# Patient Record
Sex: Female | Born: 1955
Health system: Southern US, Community
[De-identification: ages and names within clinical notes are randomized; demographics above are authoritative.]

## PROBLEM LIST (undated history)

## (undated) ENCOUNTER — Inpatient Hospital Stay (HOSPITAL_COMMUNITY): Payer: 59

## (undated) DIAGNOSIS — M1990 Unspecified osteoarthritis, unspecified site: Secondary | ICD-10-CM

## (undated) DIAGNOSIS — R0600 Dyspnea, unspecified: Secondary | ICD-10-CM

## (undated) DIAGNOSIS — I499 Cardiac arrhythmia, unspecified: Secondary | ICD-10-CM

## (undated) DIAGNOSIS — M722 Plantar fascial fibromatosis: Secondary | ICD-10-CM

## (undated) DIAGNOSIS — I10 Essential (primary) hypertension: Secondary | ICD-10-CM

## (undated) DIAGNOSIS — E7800 Pure hypercholesterolemia, unspecified: Secondary | ICD-10-CM

## (undated) DIAGNOSIS — I5189 Other ill-defined heart diseases: Secondary | ICD-10-CM

## (undated) DIAGNOSIS — J479 Bronchiectasis, uncomplicated: Secondary | ICD-10-CM

## (undated) DIAGNOSIS — C9110 Chronic lymphocytic leukemia of B-cell type not having achieved remission: Secondary | ICD-10-CM

## (undated) DIAGNOSIS — R112 Nausea with vomiting, unspecified: Secondary | ICD-10-CM

## (undated) DIAGNOSIS — N840 Polyp of corpus uteri: Secondary | ICD-10-CM

## (undated) DIAGNOSIS — I519 Heart disease, unspecified: Secondary | ICD-10-CM

## (undated) DIAGNOSIS — K219 Gastro-esophageal reflux disease without esophagitis: Secondary | ICD-10-CM

## (undated) DIAGNOSIS — Z87898 Personal history of other specified conditions: Secondary | ICD-10-CM

## (undated) DIAGNOSIS — C50919 Malignant neoplasm of unspecified site of unspecified female breast: Secondary | ICD-10-CM

## (undated) DIAGNOSIS — Z9889 Other specified postprocedural states: Secondary | ICD-10-CM

## (undated) DIAGNOSIS — I340 Nonrheumatic mitral (valve) insufficiency: Secondary | ICD-10-CM

## (undated) DIAGNOSIS — J189 Pneumonia, unspecified organism: Secondary | ICD-10-CM

## (undated) DIAGNOSIS — C801 Malignant (primary) neoplasm, unspecified: Secondary | ICD-10-CM

## (undated) HISTORY — PX: WRIST SURGERY: SHX841

## (undated) HISTORY — DX: Essential (primary) hypertension: I10

## (undated) HISTORY — PX: TUBAL LIGATION: SHX77

## (undated) HISTORY — PX: CHOLECYSTECTOMY: SHX55

## (undated) HISTORY — PX: HYSTEROSCOPY: SHX211

## (undated) HISTORY — PX: PARTIAL KNEE ARTHROPLASTY: SHX2174

## (undated) HISTORY — DX: Polyp of corpus uteri: N84.0

## (undated) HISTORY — PX: DILATION AND CURETTAGE OF UTERUS: SHX78

## (undated) HISTORY — PX: REFRACTIVE SURGERY: SHX103

## (undated) HISTORY — PX: ABDOMINAL SURGERY: SHX537

## (undated) HISTORY — PX: FOOT SURGERY: SHX648

## (undated) HISTORY — DX: Pure hypercholesterolemia, unspecified: E78.00

## (undated) HISTORY — PX: KNEE SURGERY: SHX244

## (undated) HISTORY — PX: OTHER SURGICAL HISTORY: SHX169

## (undated) MED FILL — Dexamethasone Sodium Phosphate Inj 100 MG/10ML: INTRAMUSCULAR | Qty: 2 | Status: AC

---
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
## 1998-08-20 ENCOUNTER — Other Ambulatory Visit: Admission: RE | Admit: 1998-08-20 | Discharge: 1998-08-20 | Payer: Self-pay | Admitting: Obstetrics and Gynecology

## 1999-11-25 ENCOUNTER — Other Ambulatory Visit: Admission: RE | Admit: 1999-11-25 | Discharge: 1999-11-25 | Payer: Self-pay | Admitting: Obstetrics and Gynecology

## 2000-12-07 ENCOUNTER — Other Ambulatory Visit: Admission: RE | Admit: 2000-12-07 | Discharge: 2000-12-07 | Payer: Self-pay | Admitting: Obstetrics and Gynecology

## 2001-02-01 ENCOUNTER — Other Ambulatory Visit: Admission: RE | Admit: 2001-02-01 | Discharge: 2001-02-01 | Payer: Self-pay | Admitting: Obstetrics and Gynecology

## 2001-02-01 ENCOUNTER — Encounter (INDEPENDENT_AMBULATORY_CARE_PROVIDER_SITE_OTHER): Payer: Self-pay

## 2001-07-26 ENCOUNTER — Ambulatory Visit (HOSPITAL_COMMUNITY): Admission: RE | Admit: 2001-07-26 | Discharge: 2001-07-26 | Payer: Self-pay | Admitting: Gastroenterology

## 2001-07-26 ENCOUNTER — Encounter: Payer: Self-pay | Admitting: Gastroenterology

## 2001-08-27 ENCOUNTER — Encounter: Payer: Self-pay | Admitting: General Surgery

## 2001-08-30 ENCOUNTER — Encounter (INDEPENDENT_AMBULATORY_CARE_PROVIDER_SITE_OTHER): Payer: Self-pay | Admitting: Specialist

## 2001-08-30 ENCOUNTER — Observation Stay (HOSPITAL_COMMUNITY): Admission: RE | Admit: 2001-08-30 | Discharge: 2001-08-31 | Payer: Self-pay | Admitting: General Surgery

## 2002-05-16 ENCOUNTER — Other Ambulatory Visit: Admission: RE | Admit: 2002-05-16 | Discharge: 2002-05-16 | Payer: Self-pay | Admitting: Obstetrics and Gynecology

## 2003-10-16 ENCOUNTER — Other Ambulatory Visit: Admission: RE | Admit: 2003-10-16 | Discharge: 2003-10-16 | Payer: Self-pay | Admitting: Obstetrics and Gynecology

## 2004-05-20 ENCOUNTER — Encounter: Admission: RE | Admit: 2004-05-20 | Discharge: 2004-05-20 | Payer: Self-pay | Admitting: Orthopedic Surgery

## 2004-07-19 ENCOUNTER — Observation Stay (HOSPITAL_COMMUNITY): Admission: RE | Admit: 2004-07-19 | Discharge: 2004-07-20 | Payer: Self-pay | Admitting: Orthopedic Surgery

## 2004-07-19 ENCOUNTER — Encounter (INDEPENDENT_AMBULATORY_CARE_PROVIDER_SITE_OTHER): Payer: Self-pay | Admitting: *Deleted

## 2004-10-31 ENCOUNTER — Emergency Department (HOSPITAL_COMMUNITY): Admission: EM | Admit: 2004-10-31 | Discharge: 2004-10-31 | Payer: Self-pay | Admitting: Emergency Medicine

## 2004-11-08 ENCOUNTER — Encounter: Admission: RE | Admit: 2004-11-08 | Discharge: 2004-11-08 | Payer: Self-pay | Admitting: Internal Medicine

## 2004-11-18 ENCOUNTER — Other Ambulatory Visit: Admission: RE | Admit: 2004-11-18 | Discharge: 2004-11-18 | Payer: Self-pay | Admitting: Obstetrics and Gynecology

## 2005-12-08 ENCOUNTER — Other Ambulatory Visit: Admission: RE | Admit: 2005-12-08 | Discharge: 2005-12-08 | Payer: Self-pay | Admitting: Obstetrics and Gynecology

## 2006-03-01 ENCOUNTER — Encounter: Admission: RE | Admit: 2006-03-01 | Discharge: 2006-03-22 | Payer: Self-pay | Admitting: Neurology

## 2006-06-29 ENCOUNTER — Encounter: Admission: RE | Admit: 2006-06-29 | Discharge: 2006-06-29 | Payer: Self-pay | Admitting: Neurology

## 2006-07-06 ENCOUNTER — Ambulatory Visit (HOSPITAL_COMMUNITY): Admission: RE | Admit: 2006-07-06 | Discharge: 2006-07-06 | Payer: Self-pay | Admitting: Neurology

## 2006-07-06 ENCOUNTER — Encounter: Payer: Self-pay | Admitting: Vascular Surgery

## 2006-07-14 ENCOUNTER — Encounter: Admission: RE | Admit: 2006-07-14 | Discharge: 2006-07-14 | Payer: Self-pay | Admitting: Neurology

## 2006-12-26 ENCOUNTER — Other Ambulatory Visit: Admission: RE | Admit: 2006-12-26 | Discharge: 2006-12-26 | Payer: Self-pay | Admitting: Obstetrics and Gynecology

## 2008-03-17 ENCOUNTER — Other Ambulatory Visit: Admission: RE | Admit: 2008-03-17 | Discharge: 2008-03-17 | Payer: Self-pay | Admitting: Obstetrics and Gynecology

## 2009-06-30 ENCOUNTER — Other Ambulatory Visit: Admission: RE | Admit: 2009-06-30 | Discharge: 2009-06-30 | Payer: Self-pay | Admitting: Obstetrics and Gynecology

## 2009-06-30 ENCOUNTER — Encounter: Payer: Self-pay | Admitting: Obstetrics and Gynecology

## 2009-06-30 ENCOUNTER — Ambulatory Visit: Payer: Self-pay | Admitting: Obstetrics and Gynecology

## 2009-07-15 ENCOUNTER — Encounter: Payer: Self-pay | Admitting: Obstetrics and Gynecology

## 2009-07-15 ENCOUNTER — Ambulatory Visit: Payer: Self-pay | Admitting: Obstetrics and Gynecology

## 2009-07-20 ENCOUNTER — Ambulatory Visit: Payer: Self-pay | Admitting: Obstetrics and Gynecology

## 2009-07-26 ENCOUNTER — Encounter: Payer: Self-pay | Admitting: Obstetrics and Gynecology

## 2009-07-26 ENCOUNTER — Ambulatory Visit (HOSPITAL_BASED_OUTPATIENT_CLINIC_OR_DEPARTMENT_OTHER): Admission: RE | Admit: 2009-07-26 | Discharge: 2009-07-26 | Payer: Self-pay | Admitting: Obstetrics and Gynecology

## 2009-07-26 ENCOUNTER — Ambulatory Visit: Payer: Self-pay | Admitting: Obstetrics and Gynecology

## 2009-08-02 ENCOUNTER — Ambulatory Visit: Payer: Self-pay | Admitting: Obstetrics and Gynecology

## 2010-07-18 ENCOUNTER — Other Ambulatory Visit: Admission: RE | Admit: 2010-07-18 | Discharge: 2010-07-18 | Payer: Self-pay | Admitting: Obstetrics and Gynecology

## 2010-07-18 ENCOUNTER — Ambulatory Visit: Payer: Self-pay | Admitting: Obstetrics and Gynecology

## 2011-02-24 NOTE — Op Note (Signed)
Colleen Shannon, Colleen Shannon                ACCOUNT NO.:  [id number]   MEDICAL RECORD NO.:  [id number]          PATIENT TYPE:  AMB   LOCATION:  DAY                          FACILITY:  [facility]   PHYSICIAN:  Marlowe Kays, M.D.  DATE OF BIRTH:  [birth year]   DATE OF PROCEDURE:  07/19/2004  DATE OF DISCHARGE:                                 OPERATIVE REPORT   PREOPERATIVE DIAGNOSES:  1.  Painful bunion.  2.  Clawing second toe associated with long second toe.  3.  Morton's neuroma, second and third web space, left foot.   POSTOPERATIVE DIAGNOSES:  1.  Painful bunion.  2.  Second claw toe.  3.  Atypical Morton's neuroma, second and third web space, left foot.   OPERATION:  1.  Simple bunionectomy.  2.  Fusion proximal interphalangeal joint, second toe.  3.  Excision of fibrocartilaginous tissue compressing the common digital      nerve, second and third web space, left foot.   SURGEON:  Marlowe Kays, M.D.   ASSISTANT:  Nurse.   ANESTHESIA:  General.   PATHOLOGY:  __________   JUSTIFICATION FOR PROCEDURE:  I have been following this young lady for a  long time regarding the problems listed above with her left foot.  She had  no metatarsus primus varus and I have injected the second and third web  space on her left foot.  Because of persistent problems in all three areas,  with her second toe being longer than the great toe, she is here today for  the above-mentioned surgery.  See operative description below for additional  details.   PROCEDURE:  Pneumatic tourniquet.  Leg was Esmarched nonsterilely.  Left  foot and ankle appropriate Duraprep, draped in a sterile field.  I first  made a dorsal web splitting incision in the second and third web space and  with soft dissection, I expected to find Morton's neuroma.  Found instead a  1.5-cm thick fibrocartilaginous tissue that was partially attached to the  outer side of the metatarsal head and looked like it was a remnant of  intermetatarsal ligament.  This was pressing on her common digital nerve  which appeared to have a normal appearance.  It was soft, pliable, and with  no scar about it.  Accordingly, I made the decision that she had an atypical  Morton's neuroma with Morton's neuroma-type symptoms but the problem was  coming from pressure of the fibrocartilaginous tissue pressing on the nerve,  and I elected to simply excise it.  The wound was then irrigated with  sterile saline.  Soft tissue was infiltrated with 0.5% plain Marcaine.  The  subcutaneous tissue was closed with interrupted 3-0 Vicryl, skin with  interrupted 4-0 nylon mattress sutures.  I then made a dorsal medial  incision over the prominent bunion.  Dorsal sensory nerve was freed up and  protected.  I made a flap in the capsule base distally and then used  osteotome with a counter cut at the base of the bunion, then going  retrograde with the osteotome to remove the major portion of  the bunion with  the remainder trimmed up with a small rongeur.  I then irrigated this wound  with saline as well, injected with 0.5% plain Marcaine.  With the great toe  in corrected position, I re-attached the flap with multiple interrupted 0  Vicryl, holding the great toe in a corrected position.  Subcutaneous tissue  and skin were then closed as a unit with interrupted 4-0 nylon mattress  sutures.  Finally, I made a dorsal extensor splitting incision centered at  the PIP joint in the second toe after carefully dissecting out the joint.  I  resected the distal portion of the proximal phalanx beneath the  cartilaginous cap in squared off fashion with small bone cutter and then  performed the same maneuver with the proximal portion of the middle phalanx.  I then checked and the two edges came together nicely.  I then used a 0.045  smooth K-wire antegrade first through the distal toe and then retrograde  back to the proximal phalanx into the second metatarsal  stabilizing the toe  in a corrected position.  The wound was irrigated with sterile saline, the  toe blocked with 0.5% plain Marcaine, and the wound closed with running 3-0  Vicryl in the extensor mechanism and interrupted 4-0 nylon mattress sutures  in the skin.  Betadine and Adaptic dressing were applied.  Tourniquet was  released.  She tolerated the procedure well and was taken to the recovery  room in satisfactory condition with no known complications.      JA/MEDQ  D:  07/19/2004  T:  07/19/2004  Job:  [id number]

## 2011-02-24 NOTE — Op Note (Signed)
[facility]  Patient:    Colleen Shannon, Colleen Shannon Visit Number: [id number] MRN: [id number]          Service Type: SUR Location: 4E 0431 01 Attending Physician:  Arlis Porta Dictated by:   Adolph Pollack, M.D. Proc. Date: 08/30/01 Admit Date:  08/30/2001 Discharge Date: 08/31/2001   CC:         Anselmo Rod, M.D.  Daniel L. Eda Paschal, M.D.   Operative Report  PREOPERATIVE DIAGNOSIS:  Chronic cholecystitis.  POSTOPERATIVE DIAGNOSIS:  Chronic cholecystitis.  PROCEDURE:  Laparoscopic cholecystectomy.  SURGEON:  Adolph Pollack, M.D.  ASSISTANT:  Vikki Ports, M.D.  ANESTHESIA:  General.  HISTORY OF PRESENT ILLNESS:  Colleen Shannon is a 55 year old female who has been having some problems with intermittent crampy type right upper quadrant pain. It seems to be particularly worse after eating a fatty meal. She underwent an abdominal ultrasound. This showed no gallstones or wall thickening. The common bile duct was normal in caliber. She subsequently had a nuclear medicine hepatobiliary scan which demonstrated a gallbladder ejection fraction of 51% at the lower limit of normal. She continues to have biliary colic type pains. After a long discussion with her, I discussed the possible benefits which may be only 50% at relieving her symptomatology. She would like to have her gallbladder removed and thus presents electively for that.  TECHNIQUE:  She is placed supine on the operating table and a general anesthetic was administered. The abdomen was sterilely prepped and draped. Local anesthetic was infiltrated in the supraumbilical region and a supraumbilical incision was made incising the skin and subcutaneous tissue sharply. The midline fascia was incised. The peritoneal cavity was entered bluntly and under direct vision. A pursestring suture of #0 Vicryl was placed around the fascial edges. A Hasson trocar was introduced into the  peritoneal cavity and pneumoperitoneum created by insufflation of CO2 gas.  Next she was placed in the appropriate position. Under direct vision, an 11 mm trocar was placed through an epigastric incision and two 5 mm trocars were placed with two right abdominal incisions. The fundus of the gallbladder was grasped and thin adhesions between the duodenum and body and infundibulum were noted and divided sharply. The infundibulum was then completed immobilized and using blunt dissection close to the gallbladder, I isolated the cystic duct at its junction with the gallbladder. I placed a clip at the cystic duct gallbladder neck junction. An incision was made into the cystic duct. The cystic duct was very small. I was able to milk bile back through the incision. I next inserted a cholangiocatheter through the anterior abdominal wall. It was a soft cholangiocatheter and in attempting to place it into the cystic duct, there seemed to be a likely valvular obstruction. The cystic duct was fairly thin and I did not want to avulse it. Given that her liver function tests were normal and the common bile duct diameter was normal, I decided to abort the cholangiogram. I went ahead and clipped the cystic duct three times proximally and divided it. I then identified the cystic artery, clipped it and divided it. I dissected the gallbladder free from the liver bed with electrocautery. A small puncture wound was made in the gallbladder and some bile leakage was noted. The gallbladder was then placed in an endopouch bag. I then copiously irrigated the perihepatic area with saline solution. There was one avulsion to the liver that was identified and bleeding was controlled easily with electrocautery. Once I  had copiously irrigated out the right upper quadrant area, I reinspected the gallbladder fossa and noted no bleeding or bile leakage. I then removed the gallbladder through the supraumbilical incision and  closed the supraumbilical fascial defect by tightening down and tying down the pursestring suture. I evacuated any remaining fluid and then removed the trocars and released the pneumoperitoneum. The skin incisions were then closed with 4-0 monocryl subcuticular stitches and Steri-Strips and sterile dressings were applied.  She tolerated the procedure well without any apparent complications and was taken to the recovery room in satisfactory condition. Dictated by:   Adolph Pollack, M.D. Attending Physician:  Arlis Porta DD:  08/30/01 TD:  08/31/01 Job: [id number]

## 2011-07-13 ENCOUNTER — Encounter: Payer: Self-pay | Admitting: Obstetrics and Gynecology

## 2011-09-08 ENCOUNTER — Telehealth: Payer: Self-pay | Admitting: *Deleted

## 2011-09-08 NOTE — Telephone Encounter (Signed)
Patient called saying she wanted to stop all HRT as soon as she could.  She said she was overdue for annual.  Advised patient really needs to schedule annual so DG could access patient and determine what was best for patient.  Transferred to appointments to set annual exam.

## 2011-10-16 ENCOUNTER — Encounter: Payer: Self-pay | Admitting: Gynecology

## 2011-10-16 DIAGNOSIS — N840 Polyp of corpus uteri: Secondary | ICD-10-CM | POA: Insufficient documentation

## 2011-10-23 ENCOUNTER — Encounter: Payer: Self-pay | Admitting: Obstetrics and Gynecology

## 2011-12-01 ENCOUNTER — Other Ambulatory Visit (HOSPITAL_COMMUNITY)
Admission: RE | Admit: 2011-12-01 | Discharge: 2011-12-01 | Disposition: A | Discharge status: Home / Self Care | Payer: BC Managed Care – PPO | Source: Ambulatory Visit | Attending: Obstetrics and Gynecology | Admitting: Obstetrics and Gynecology

## 2011-12-01 ENCOUNTER — Ambulatory Visit (INDEPENDENT_AMBULATORY_CARE_PROVIDER_SITE_OTHER): Payer: BC Managed Care – PPO | Admitting: Obstetrics and Gynecology

## 2011-12-01 ENCOUNTER — Encounter: Payer: Self-pay | Admitting: Obstetrics and Gynecology

## 2011-12-01 VITALS — BP 120/76 | Ht 61.0 in | Wt 157.0 lb

## 2011-12-01 DIAGNOSIS — Z01419 Encounter for gynecological examination (general) (routine) without abnormal findings: Secondary | ICD-10-CM | POA: Insufficient documentation

## 2011-12-01 DIAGNOSIS — I10 Essential (primary) hypertension: Secondary | ICD-10-CM | POA: Insufficient documentation

## 2011-12-01 DIAGNOSIS — E7800 Pure hypercholesterolemia, unspecified: Secondary | ICD-10-CM | POA: Insufficient documentation

## 2011-12-01 MED ORDER — PROGESTERONE MICRONIZED 200 MG PO CAPS: 200.0000 mg | ORAL_CAPSULE | ORAL | Status: DC

## 2011-12-01 MED ORDER — ESTRADIOL 1 MG PO TABS: 1.0000 mg | ORAL_TABLET | Freq: Every day | ORAL | Status: DC

## 2011-12-01 NOTE — Progress Notes (Signed)
The patient came back to see me today for her annual GYN exam. She remains on HRT with excellent results. She has 2 days of withdrawal spotting. She is having no pelvic pain. She is up-to-date on mammograms. She a normal bone density. She is being treated for hypertension by her cardiologist. She does her lab work there. She is having trouble with her knee. She is facing knee replacement in the future. She has noticed a reduced libido. She is having trouble with weight.  Physical examination:  Colleen Shannon present. HEENT within normal limits. Neck: Thyroid not large. No masses. Supraclavicular nodes: not enlarged. Breasts: Examined in both sitting midline position. No skin changes and no masses. Abdomen: Soft no guarding rebound or masses or hernia. Pelvic: External: Within normal limits. BUS: Within normal limits. Vaginal:within normal limits. Good estrogen effect. No evidence of cystocele rectocele or enterocele. Cervix: clean. Uterus: Normal size and shape. Adnexa: No masses. Rectovaginal exam: Confirmatory and negative. Extremities: Within normal limits.  Assessment: Menopausal symptoms  Plan: Discussed estrogen patch for safety. For the moment she will continue with oral estrogen. Added  testosterone cream 2%.

## 2011-12-02 LAB — URINALYSIS W MICROSCOPIC + REFLEX CULTURE
Bacteria, UA: NONE SEEN
Glucose, UA: NEGATIVE mg/dL
Leukocytes, UA: NEGATIVE
Nitrite: NEGATIVE
pH: 6 (ref 5.0–8.0)

## 2011-12-06 MED ORDER — ESTRADIOL 1 MG PO TABS: 1.0000 mg | ORAL_TABLET | Freq: Every day | ORAL | Status: DC

## 2011-12-06 MED ORDER — PROGESTERONE MICRONIZED 200 MG PO CAPS: 200.0000 mg | ORAL_CAPSULE | ORAL | Status: DC

## 2011-12-06 NOTE — Progress Notes (Signed)
Addended by: Valeda Malm L on: 12/06/2011 09:53 AM   Modules accepted: Orders

## 2012-05-22 DIAGNOSIS — I10 Essential (primary) hypertension: Secondary | ICD-10-CM

## 2012-05-22 DIAGNOSIS — E7800 Pure hypercholesterolemia, unspecified: Secondary | ICD-10-CM

## 2012-05-22 HISTORY — DX: Pure hypercholesterolemia, unspecified: E78.00

## 2012-05-22 HISTORY — DX: Essential (primary) hypertension: I10

## 2012-08-14 ENCOUNTER — Telehealth: Payer: Self-pay | Admitting: *Deleted

## 2012-08-14 MED ORDER — PROGESTERONE MICRONIZED 200 MG PO CAPS: 200.0000 mg | ORAL_CAPSULE | ORAL | Status: DC

## 2012-08-14 MED ORDER — ESTRADIOL 1 MG PO TABS: 1.0000 mg | ORAL_TABLET | Freq: Every day | ORAL | Status: DC

## 2012-08-14 NOTE — Telephone Encounter (Signed)
Pt would like her progesterone and estradiol sent to [facility] this is new pharmacy. Both Rx sent.

## 2012-08-30 ENCOUNTER — Encounter: Payer: Self-pay | Admitting: Obstetrics and Gynecology

## 2012-09-11 ENCOUNTER — Encounter: Payer: Self-pay | Admitting: Obstetrics and Gynecology

## 2013-01-22 ENCOUNTER — Other Ambulatory Visit: Payer: Self-pay | Admitting: Obstetrics and Gynecology

## 2013-06-02 ENCOUNTER — Encounter: Payer: Self-pay | Admitting: *Deleted

## 2013-06-05 ENCOUNTER — Encounter: Payer: Self-pay | Admitting: Cardiovascular Disease

## 2013-06-05 ENCOUNTER — Ambulatory Visit (INDEPENDENT_AMBULATORY_CARE_PROVIDER_SITE_OTHER): Payer: BC Managed Care – PPO | Admitting: Cardiovascular Disease

## 2013-06-05 VITALS — BP 130/70 | HR 75 | Ht 61.5 in | Wt 146.7 lb

## 2013-06-05 DIAGNOSIS — I119 Hypertensive heart disease without heart failure: Secondary | ICD-10-CM

## 2013-06-05 MED ORDER — AMLODIPINE BESYLATE 5 MG PO TABS: 5.0000 mg | ORAL_TABLET | Freq: Every day | ORAL | Status: DC

## 2013-06-05 MED ORDER — CARVEDILOL 6.25 MG PO TABS: 6.2500 mg | ORAL_TABLET | Freq: Two times a day (BID) | ORAL | Status: DC

## 2013-06-05 MED ORDER — ATORVASTATIN CALCIUM 40 MG PO TABS: 40.0000 mg | ORAL_TABLET | Freq: Every day | ORAL | Status: DC

## 2013-06-05 NOTE — Patient Instructions (Addendum)
Your physician recommends that you schedule a follow-up appointment in: 1 YEAR. No changes were made today. 

## 2013-06-07 ENCOUNTER — Encounter: Payer: Self-pay | Admitting: Cardiovascular Disease

## 2013-06-07 NOTE — Progress Notes (Signed)
Patient ID: Colleen Shannon, female   DOB: Jan 05, 1956, 57 y.o.   MRN: [id number]     HPI: Colleen Shannon, is a 57 y.o. female presents to the office today for a one-year cardiology evaluation.  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 She has a history of hypertension, previous chest pain for which he was started on carvedilol initially nifedipine by Dr. Lynnea Ferrier with a presumptive dose of possible spasm. In August 2013 a nuclear perfusion study was entirely normal. 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 She has been on lipid-lowering therapy with atorvastatin. When I saw her last year, discontinued her nifedipine and switch her to amlodipine. She states her blood pressure has been controlled on this regimen. She also tells me she may ultimately require a knee replacement. Her last echo Doppler study in August 2013 showed normal systolic function. She did have mild mitral regurgitation.  Over the past year, she has done well. She denies tachycardia palpitations. She denies chest pain. She will be establishing primary care with Dr. Severiano Gilbert next month and states that Dr. Katrinka Blazing will be checking a complete set of laboratory at that time.   Past Medical History  Diagnosis Date  . Endometrial polyp   . Hypertension 05/22/12    ECHO-EF >55% trace tricupsid regurgitation. There is mild mitral regurgitation.   . Elevated cholesterol 05/22/12    Nuc stress test-Normal. The patiet had a markedly positive GXT for ischemia. post stress EF 72%    Past Surgical History  Procedure Laterality Date  . Cesarean section      X 2  . Tubal ligation    . Dilation and curettage of uterus    . Hysteroscopy    . Cholecystectomy    . Abdominal surgery      Abdominoplasty  . Foot surgery    . Knee surgery      arthroscopic    No Known  Allergies  Current Outpatient Prescriptions  Medication Sig Dispense Refill  . amLODipine (NORVASC) 5 MG tablet Take 1 tablet (5 mg total) by mouth daily.  90 tablet  3  . atorvastatin (LIPITOR) 40 MG tablet Take 1 tablet (40 mg total) by mouth daily.  90 tablet  3  . Calcium Carbonate-Vitamin D (CALCIUM + D PO) Take by mouth.        . carvedilol (COREG) 6.25 MG tablet Take 1 tablet (6.25 mg total) by mouth 2 (two) times daily with a meal.  180 tablet  3  . estradiol (ESTRACE) 1 MG tablet Take 0.5 mg by mouth daily.      . progesterone (PROMETRIUM) 200 MG capsule Take 100 mg by mouth as directed. Take days 1-12       No current facility-administered medications for this visit.    History   Social History  . Marital Status: Married    Spouse Name: N/A    Number of Children: N/A  . Years of Education: N/A   Occupational History  . Not on file.   Social History Main Topics  . Smoking status: Never Smoker   . Smokeless tobacco: Not on file  . Alcohol Use: 0.5 oz/week    1 drink(s) per week  . Drug Use: Not on file  . Sexual Activity: Yes    Birth Control/ Protection: Surgical, Post-menopausal  Other Topics Concern  . Not on file   Social History Narrative  . No narrative on file   Additional social history is notable that she is married for 33 years. 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 There is no tobacco use. He does drink occasional wine. She does exercise fairly regularly on a bike and elliptical machine.  Family History  Problem Relation Age of Onset  . Heart disease Father   . Hypertension Paternal Grandfather   . Stroke Paternal Grandfather   . Aneurysm Mother   . Hyperlipidemia Mother   . Heart disease Maternal Grandfather   . Hypertension Brother   . Cancer - Other Paternal Grandmother     uterine    ROS is negative for fevers, chills or night sweats.   Other system review is negative.  PE BP 130/70  Pulse  75  Ht 5' 1.5" (1.562 m)  Wt 146 lb 11.2 oz (66.543 kg)  BMI 27.27 kg/m2  General: Alert, oriented, no distress.  Skin: normal turgor, no rashes HEENT: Normocephalic, atraumatic. Pupils round and reactive; sclera anicteric;no lid lag.  Nose without nasal septal hypertrophy Mouth/Parynx benign; Mallinpatti scale 2 Neck: No JVD, no carotid briuts Lungs: clear to ausculatation and percussion; no wheezing or rales Heart: RRR, s1 s2 normal 1/6 systolic murmur at the apex compatible with mitral regurgitation Abdomen: soft, nontender; no hepatosplenomehaly, BS+; abdominal aorta nontender and not dilated by palpation. Pulses 2+ Extremities: no clubbing cyanosis or edema, Homan's sign negative  Neurologic: grossly nonfocal  ECG: Normal sinus rhythm at 75 beats per minute without ectopy. Normal intervals.  LABS:  BMET No results found for this basename: na, k, cl, co2, glucose, bun, creatinine, calcium, gfrnonaa, gfraa     Hepatic Function Panel  No results found for this basename: prot, albumin, ast, alt, alkphos, bilitot, bilidir, ibili     CBC No results found for this basename: wbc, rbc, hgb, hct, plt, mcv, mch, mchc, rdw, neutrabs, lymphsabs, monoabs, eosabs, basosabs     BNP No results found for this basename: probnp    Lipid Panel  No results found for this basename: chol, trig, hdl, cholhdl, vldl, ldlcalc     RADIOLOGY: No results found.    ASSESSMENT AND PLAN: Clinically, Colleen Shannon continues to do well. Her blood pressure is well-controlled. Her rhythm is stable. I will try to obtain the blood work when it is complete by Dr. Katrinka Blazing to make certain she is optimally treated. Last year she did undergo a nuclear perfusion scan which was normal. His normal LV function both systolically and diastolically. If she does ultimately require knee replacement surgery she will be given Cardiologic clearance. I will see her in one year for followup evaluation or sooner if problems  arise.     Lennette Bihari, MD, [facility]  06/07/2013 11:54 AM

## 2013-07-17 ENCOUNTER — Other Ambulatory Visit: Payer: Self-pay | Admitting: Dermatology

## 2013-08-21 ENCOUNTER — Other Ambulatory Visit: Payer: Self-pay | Admitting: *Deleted

## 2013-08-21 DIAGNOSIS — I83893 Varicose veins of bilateral lower extremities with other complications: Secondary | ICD-10-CM

## 2013-09-16 ENCOUNTER — Encounter: Payer: Self-pay | Admitting: Vascular Surgery

## 2013-09-17 ENCOUNTER — Ambulatory Visit (HOSPITAL_COMMUNITY)
Admission: RE | Admit: 2013-09-17 | Discharge: 2013-09-17 | Disposition: A | Discharge status: Home / Self Care | Payer: BC Managed Care – PPO | Source: Ambulatory Visit | Attending: Vascular Surgery | Admitting: Vascular Surgery

## 2013-09-17 ENCOUNTER — Encounter: Payer: Self-pay | Admitting: Vascular Surgery

## 2013-09-17 ENCOUNTER — Ambulatory Visit (INDEPENDENT_AMBULATORY_CARE_PROVIDER_SITE_OTHER): Payer: BC Managed Care – PPO | Admitting: Vascular Surgery

## 2013-09-17 ENCOUNTER — Encounter (INDEPENDENT_AMBULATORY_CARE_PROVIDER_SITE_OTHER): Payer: Self-pay

## 2013-09-17 ENCOUNTER — Encounter: Payer: Self-pay | Admitting: *Deleted

## 2013-09-17 VITALS — BP 139/70 | HR 87 | Resp 16 | Ht 61.5 in | Wt 147.0 lb

## 2013-09-17 DIAGNOSIS — I83893 Varicose veins of bilateral lower extremities with other complications: Secondary | ICD-10-CM

## 2013-09-17 NOTE — Progress Notes (Signed)
[facility] [place]  Patient name: Colleen Shannon MRN: [id number] DOB: 1956-01-30 Sex: female  REASON FOR CONSULT: bilateral lower extremity varicose veins.  HPI: Colleen Shannon is a 57 y.o. female with a long history of varicose veins of both lower extremities. She experiences aching pain, heaviness, fatigue, throbbing pain, and swelling in both lower extremities. Her symptoms are aggravated by standing. 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 She denies any previous history of DVT or phlebitis. She currently does not wear stockings.   Past Medical History  Diagnosis Date  . Endometrial polyp   . Hypertension 05/22/12    ECHO-EF >55% trace tricupsid regurgitation. There is mild mitral regurgitation.   . Elevated cholesterol 05/22/12    Nuc stress test-Normal. The patiet had a markedly positive GXT for ischemia. post stress EF 72%   Family History  Problem Relation Age of Onset  . Heart disease Father   . Hypertension Paternal Grandfather   . Stroke Paternal Grandfather   . Aneurysm Mother   . Hyperlipidemia Mother   . Heart disease Maternal Grandfather   . Hypertension Brother   . Cancer - Other Paternal Grandmother     uterine   SOCIAL HISTORY: History  Substance Use Topics  . Smoking status: Never Smoker   . Smokeless tobacco: Never Used  . Alcohol Use: 0.5 oz/week    1 drink(s) per week   No Known Allergies Current Outpatient Prescriptions  Medication Sig Dispense Refill  . amLODipine (NORVASC) 5 MG tablet Take 1 tablet (5 mg total) by mouth daily.  90 tablet  3  . atorvastatin (LIPITOR) 40 MG tablet Take 1 tablet (40 mg total) by mouth daily.  90 tablet  3  . Calcium Carbonate-Vitamin D (CALCIUM + D PO) Take by mouth.        . carvedilol (COREG) 6.25 MG tablet Take 1 tablet (6.25 mg total) by mouth 2 (two) times daily with a meal.  180 tablet  3  . estradiol (ESTRACE) 1 MG tablet Take 0.5 mg by mouth daily.        . progesterone (PROMETRIUM) 200 MG capsule Take 100 mg by mouth as directed. Take days 1-12       No current facility-administered medications for this visit.   REVIEW OF SYSTEMS: Arly.Keller ] denotes positive finding; [  ] denotes negative finding  CARDIOVASCULAR:  [ ]  chest pain   [ ]  chest pressure   [ ]  palpitations   [ ]  orthopnea   [ ]  dyspnea on exertion   [ ]  claudication   [ ]  rest pain   [ ]  DVT   [ ]  phlebitis PULMONARY:   [ ]  productive cough   [ ]  asthma   [ ]  wheezing NEUROLOGIC:   [ ]  weakness  [ ]  paresthesias  [ ]  aphasia  [ ]  amaurosis  [ ]  dizziness HEMATOLOGIC:   [ ]  bleeding problems   [ ]  clotting disorders MUSCULOSKELETAL:  [ ]  joint pain   [ ]  joint swelling [ ]  leg swelling GASTROINTESTINAL: [ ]   blood in stool  [ ]   hematemesis GENITOURINARY:  [ ]   dysuria  [ ]   hematuria PSYCHIATRIC:  [ ]  history of major depression INTEGUMENTARY:  [ ]  rashes  [ ]  ulcers CONSTITUTIONAL:  [ ]  fever   [ ]  chills  PHYSICAL EXAM: Filed Vitals:   09/17/13 1448  BP: 139/70  Pulse: 87  Resp: 16  Height: 5' 1.5" (1.562 m)  Weight: 147 lb (66.679 kg)   Body mass index is 27.33 kg/(m^2). GENERAL: The patient is a well-nourished female, in no acute distress. The vital signs are documented above. CARDIOVASCULAR: There is a regular rate and rhythm. I do not detect carotid bruits. She has palpable dorsalis pedis pulses bilaterally. She has mild bilateral lower extremity swelling. PULMONARY: There is good air exchange bilaterally without wheezing or rales. ABDOMEN: Soft and non-tender with normal pitched bowel sounds.  MUSCULOSKELETAL: There are no major deformities or cyanosis. NEUROLOGIC: No focal weakness or paresthesias are detected. SKIN: she has some telangiectatic and spider veins along the posterior lateral aspect of her right calf proximally. She also has a cluster of spider veins along the distal right medial thigh. PSYCHIATRIC: The patient has a normal affect.  DATA:  I have  independently interpreted her venous duplex scan. On the right side, there is no evidence of DVT. She does have reflux in the right common femoral vein and also reflux in the saphenofemoral junction. There is one short segment of with incompetent valves in the greater saphenous vein in the distal thigh.  On the left side, there is no evidence of DVT. She does have reflux in the left common femoral vein and femoral vein. There is no incompetence in the greater saphenous vein on the left.  MEDICAL ISSUES:  Varicose veins of lower extremities with other complications This patient has evidence of chronic venous insufficiency bilaterally. She does not have significant reflux in the saphenous veins. We have discussed the importance of intermittent leg elevation and the proper positioning for this. I have also written her a prescription for knee-high compression stockings with a gradient of 15-20 mm of mercury. We have also discussed the importance of staying as active as possible and try to avoid prolonged sitting and standing. I've asked her to consider water aerobics which is also helpful for patients with venous disease. With respect to her spider veins implanted patient she appears to be a reasonable candidate for sclerotherapy and I will have her contact Clementeen Hoof, RN to discuss this.   DICKSON,CHRISTOPHER S [facility] [place] Beeper: [phone]

## 2013-09-17 NOTE — Assessment & Plan Note (Signed)
This patient has evidence of chronic venous insufficiency bilaterally. She does not have significant reflux in the saphenous veins. We have discussed the importance of intermittent leg elevation and the proper positioning for this. I have also written her a prescription for knee-high compression stockings with a gradient of 15-20 mm of mercury. We have also discussed the importance of staying as active as possible and try to avoid prolonged sitting and standing. I've asked her to consider water aerobics which is also helpful for patients with venous disease. With respect to her spider veins implanted patient she appears to be a reasonable candidate for sclerotherapy and I will have her contact Clementeen Hoof, RN to discuss this.

## 2013-09-18 ENCOUNTER — Encounter: Payer: Self-pay | Admitting: *Deleted

## 2013-11-04 ENCOUNTER — Encounter: Payer: Self-pay | Admitting: *Deleted

## 2013-11-05 ENCOUNTER — Ambulatory Visit (INDEPENDENT_AMBULATORY_CARE_PROVIDER_SITE_OTHER): Payer: BC Managed Care – PPO | Admitting: *Deleted

## 2013-11-05 DIAGNOSIS — I781 Nevus, non-neoplastic: Secondary | ICD-10-CM

## 2013-11-05 NOTE — Progress Notes (Signed)
X=.5% Asclera administered with a 27g butterfly.  Patient received a total of 6cc.  Cutaneous Laser:pulsed mode  810 j/cm2  444ms dleay  70ms duration  0.5 spot  Total pulses: 376 Total energy 594.95  Total time::04  Photos: yes  Compression stockings applied: yes  Treated all areas of concern with a combo of scler and cutaneous laser. Tol the procedure well. Easy access. Anticipate good results. Will follow prn.

## 2014-03-12 ENCOUNTER — Other Ambulatory Visit: Payer: Self-pay | Admitting: Obstetrics and Gynecology

## 2014-03-13 LAB — CYTOLOGY - PAP

## 2014-03-31 ENCOUNTER — Telehealth: Payer: Self-pay | Admitting: Cardiovascular Disease

## 2014-03-31 NOTE — Telephone Encounter (Signed)
Pt. Stated she was going to drop the pre op form off to be signed by Dr. Claiborne Billings

## 2014-03-31 NOTE — Telephone Encounter (Signed)
Please call,she need medical clarence before her knee surgery.Marland KitchenShe will talk to you before she fax you the clarence papers.

## 2014-04-06 ENCOUNTER — Telehealth: Payer: Self-pay | Admitting: Cardiovascular Disease

## 2014-04-06 NOTE — Telephone Encounter (Signed)
Returned a call to patient. Left message on cell # VM Clearance signed and faxed back to [place] on June 25th. Recommend that she call their office for further instructions.

## 2014-04-06 NOTE — Telephone Encounter (Signed)
Pt dropped off some papers to clear her for surgery on 03-31-14.Pt would like to know the status oo them please.

## 2014-04-07 ENCOUNTER — Telehealth: Payer: Self-pay | Admitting: *Deleted

## 2014-04-07 NOTE — Telephone Encounter (Signed)
Faxed surgical clearance for patient's surgery scheduled for 06/04/14

## 2014-04-23 ENCOUNTER — Other Ambulatory Visit: Payer: Self-pay | Admitting: Orthopedic Surgery

## 2014-04-23 NOTE — Progress Notes (Signed)
Arlee Muslim, PA  - Please enter preop orders in Epic for Colleen Shannon - she is coming to [facility] on 7/23 for preop / labs.  Thanks.

## 2014-04-24 ENCOUNTER — Encounter (HOSPITAL_COMMUNITY): Payer: Self-pay | Admitting: Pharmacy Technician

## 2014-04-29 NOTE — Patient Instructions (Signed)
Colleen Shannon  04/29/2014   Your procedure is scheduled on:  05/11/2014  [id number]  Report to [facility] Entrance.  Follow the Signs to [place] at   [place]     am  Call this number if you have problems the morning of surgery: [phone]   Remember:   Do not eat food or drink liquids after midnight.   Take these medicines the morning of surgery with A SIP OF WATER:    Do not wear jewelry, make-up or nail polish.  Do not wear lotions, powders, or perfumes., deodorant .  Do not shave 48 hours prior to surgery.   Do not bring valuables to the hospital.  Contacts, dentures or bridgework may not be worn into surgery.  Leave suitcase in the car. After surgery it may be brought to your room.  For patients admitted to the hospital, checkout time is 11:00 AM the day of  discharge.      [facility] - Preparing for Surgery Before surgery, you can play an important role.  Because skin is not sterile, your skin needs to be as free of germs as possible.  You can reduce the number of germs on your skin by washing with CHG (chlorahexidine gluconate) soap before surgery.  CHG is an antiseptic cleaner which kills germs and bonds with the skin to continue killing germs even after washing. Please DO NOT use if you have an allergy to CHG or antibacterial soaps.  If your skin becomes reddened/irritated stop using the CHG and inform your nurse when you arrive at Short Stay. Do not shave (including legs and underarms) for at least 48 hours prior to the first CHG shower.  You may shave your face/neck. Please follow these instructions carefully:  1.  Shower with CHG Soap the night before surgery and the  morning of Surgery.  2.  If you choose to wash your hair, wash your hair first as usual with your  normal  shampoo.  3.  After you shampoo, rinse your hair and body thoroughly to remove the  shampoo.                           4.  Use CHG as you would any other liquid soap.  You can apply chg  directly  to the skin and wash                       Gently with a scrungie or clean washcloth.  5.  Apply the CHG Soap to your body ONLY FROM THE NECK DOWN.   Do not use on face/ open                           Wound or open sores. Avoid contact with eyes, ears mouth and genitals (private parts).                       Wash face,  Genitals (private parts) with your normal soap.             6.  Wash thoroughly, paying special attention to the area where your surgery  will be performed.  7.  Thoroughly rinse your body with warm water from the neck down.  8.  DO NOT shower/wash with your normal soap after using and rinsing off  the CHG Soap.  9.  Pat yourself dry with a clean towel.            10.  Wear clean pajamas.            11.  Place clean sheets on your bed the night of your first shower and do not  sleep with pets. Day of Surgery : Do not apply any lotions/deodorants the morning of surgery.  Please wear clean clothes to the hospital/surgery center.  FAILURE TO FOLLOW THESE INSTRUCTIONS MAY RESULT IN THE CANCELLATION OF YOUR SURGERY PATIENT SIGNATURE_________________________________  NURSE SIGNATURE__________________________________  ________________________________________________________________________  WHAT IS A BLOOD TRANSFUSION? Blood Transfusion Information  A transfusion is the replacement of blood or some of its parts. Blood is made up of multiple cells which provide different functions.  Red blood cells carry oxygen and are used for blood loss replacement.  White blood cells fight against infection.  Platelets control bleeding.  Plasma helps clot blood.  Other blood products are available for specialized needs, such as hemophilia or other clotting disorders. BEFORE THE TRANSFUSION  Who gives blood for transfusions?   Healthy volunteers who are fully evaluated to make sure their blood is safe. This is blood bank blood. Transfusion therapy is the safest  it has ever been in the practice of medicine. Before blood is taken from a donor, a complete history is taken to make sure that person has no history of diseases nor engages in risky social behavior (examples are intravenous drug use or sexual activity with multiple partners). The donor's travel history is screened to minimize risk of transmitting infections, such as malaria. The donated blood is tested for signs of infectious diseases, such as HIV and hepatitis. The blood is then tested to be sure it is compatible with you in order to minimize the chance of a transfusion reaction. If you or a relative donates blood, this is often done in anticipation of surgery and is not appropriate for emergency situations. It takes many days to process the donated blood. RISKS AND COMPLICATIONS Although transfusion therapy is very safe and saves many lives, the main dangers of transfusion include:   Getting an infectious disease.  Developing a transfusion reaction. This is an allergic reaction to something in the blood you were given. Every precaution is taken to prevent this. The decision to have a blood transfusion has been considered carefully by your caregiver before blood is given. Blood is not given unless the benefits outweigh the risks. AFTER THE TRANSFUSION  Right after receiving a blood transfusion, you will usually feel much better and more energetic. This is especially true if your red blood cells have gotten low (anemic). The transfusion raises the level of the red blood cells which carry oxygen, and this usually causes an energy increase.  The nurse administering the transfusion will monitor you carefully for complications. HOME CARE INSTRUCTIONS  No special instructions are needed after a transfusion. You may find your energy is better. Speak with your caregiver about any limitations on activity for underlying diseases you may have. SEEK MEDICAL CARE IF:   Your condition is not improving after  your transfusion.  You develop redness or irritation at the intravenous (IV) site. SEEK IMMEDIATE MEDICAL CARE IF:  Any of the following symptoms occur over the next 12 hours:  Shaking chills.  You have a temperature by mouth above 102 F (38.9 C), not controlled by medicine.  Chest, back, or muscle pain.  People around you feel you are not acting correctly or  are confused.  Shortness of breath or difficulty breathing.  Dizziness and fainting.  You get a rash or develop hives.  You have a decrease in urine output.  Your urine turns a dark color or changes to pink, red, or brown. Any of the following symptoms occur over the next 10 days:  You have a temperature by mouth above 102 F (38.9 C), not controlled by medicine.  Shortness of breath.  Weakness after normal activity.  The white part of the eye turns yellow (jaundice).  You have a decrease in the amount of urine or are urinating less often.  Your urine turns a dark color or changes to pink, red, or brown. Document Released: 09/22/2000 Document Revised: 12/18/2011 Document Reviewed: 05/11/2008 ExitCare Patient Information 2014 [place].  _______________________________________________________________________  Incentive Spirometer  An incentive spirometer is a tool that can help keep your lungs clear and active. This tool measures how well you are filling your lungs with each breath. Taking long deep breaths may help reverse or decrease the chance of developing breathing (pulmonary) problems (especially infection) following:  A long period of time when you are unable to move or be active. BEFORE THE PROCEDURE   If the spirometer includes an indicator to show your best effort, your nurse or respiratory therapist will set it to a desired goal.  If possible, sit up straight or lean slightly forward. Try not to slouch.  Hold the incentive spirometer in an upright position. INSTRUCTIONS FOR USE  1. Sit on  the edge of your bed if possible, or sit up as far as you can in bed or on a chair. 2. Hold the incentive spirometer in an upright position. 3. Breathe out normally. 4. Place the mouthpiece in your mouth and seal your lips tightly around it. 5. Breathe in slowly and as deeply as possible, raising the piston or the ball toward the top of the column. 6. Hold your breath for 3-5 seconds or for as long as possible. Allow the piston or ball to fall to the bottom of the column. 7. Remove the mouthpiece from your mouth and breathe out normally. 8. Rest for a few seconds and repeat Steps 1 through 7 at least 10 times every 1-2 hours when you are awake. Take your time and take a few normal breaths between deep breaths. 9. The spirometer may include an indicator to show your best effort. Use the indicator as a goal to work toward during each repetition. 10. After each set of 10 deep breaths, practice coughing to be sure your lungs are clear. If you have an incision (the cut made at the time of surgery), support your incision when coughing by placing a pillow or rolled up towels firmly against it. Once you are able to get out of bed, walk around indoors and cough well. You may stop using the incentive spirometer when instructed by your caregiver.  RISKS AND COMPLICATIONS  Take your time so you do not get dizzy or light-headed.  If you are in pain, you may need to take or ask for pain medication before doing incentive spirometry. It is harder to take a deep breath if you are having pain. AFTER USE  Rest and breathe slowly and easily.  It can be helpful to keep track of a log of your progress. Your caregiver can provide you with a simple table to help with this. If you are using the spirometer at home, follow these instructions: [place] IF:  You are having difficultly using the spirometer.  You have trouble using the spirometer as often as instructed.  Your pain medication is not giving  enough relief while using the spirometer.  You develop fever of 100.5 F (38.1 C) or higher. SEEK IMMEDIATE MEDICAL CARE IF:   You cough up bloody sputum that had not been present before.  You develop fever of 102 F (38.9 C) or greater.  You develop worsening pain at or near the incision site. MAKE SURE YOU:   Understand these instructions.  Will watch your condition.  Will get help right away if you are not doing well or get worse. Document Released: 02/05/2007 Document Revised: 12/18/2011 Document Reviewed: 04/08/2007 ExitCare Patient Information 2014 ExitCare, [place].   ________________________________________________________________________    Please read over the following fact sheets that you were given: MRSA Information, coughing and deep breathing exercises, leg exercises

## 2014-04-30 ENCOUNTER — Encounter (HOSPITAL_COMMUNITY): Payer: Self-pay

## 2014-04-30 ENCOUNTER — Encounter (HOSPITAL_COMMUNITY)
Admission: RE | Admit: 2014-04-30 | Discharge: 2014-04-30 | Disposition: A | Discharge status: Home / Self Care | Payer: BC Managed Care – PPO | Source: Ambulatory Visit | Attending: Orthopedic Surgery | Admitting: Orthopedic Surgery

## 2014-04-30 ENCOUNTER — Ambulatory Visit (HOSPITAL_COMMUNITY)
Admission: RE | Admit: 2014-04-30 | Discharge: 2014-04-30 | Disposition: A | Discharge status: Home / Self Care | Payer: BC Managed Care – PPO | Source: Ambulatory Visit | Attending: Orthopedic Surgery | Admitting: Orthopedic Surgery

## 2014-04-30 DIAGNOSIS — Z01812 Encounter for preprocedural laboratory examination: Secondary | ICD-10-CM | POA: Insufficient documentation

## 2014-04-30 DIAGNOSIS — I10 Essential (primary) hypertension: Secondary | ICD-10-CM | POA: Insufficient documentation

## 2014-04-30 DIAGNOSIS — Z01818 Encounter for other preprocedural examination: Secondary | ICD-10-CM | POA: Insufficient documentation

## 2014-04-30 DIAGNOSIS — M47814 Spondylosis without myelopathy or radiculopathy, thoracic region: Secondary | ICD-10-CM | POA: Insufficient documentation

## 2014-04-30 HISTORY — DX: Malignant (primary) neoplasm, unspecified: C80.1

## 2014-04-30 HISTORY — DX: Unspecified osteoarthritis, unspecified site: M19.90

## 2014-04-30 LAB — COMPREHENSIVE METABOLIC PANEL
ALBUMIN: 4.2 g/dL (ref 3.5–5.2)
ALK PHOS: 54 U/L (ref 39–117)
ALT: 29 U/L (ref 0–35)
AST: 37 U/L (ref 0–37)
Anion gap: 11 (ref 5–15)
BUN: 13 mg/dL (ref 6–23)
CALCIUM: 9.5 mg/dL (ref 8.4–10.5)
CO2: 28 mEq/L (ref 19–32)
Chloride: 101 mEq/L (ref 96–112)
Creatinine, Ser: 0.61 mg/dL (ref 0.50–1.10)
GFR calc Af Amer: >90 mL/min (ref >90)
GFR calc non Af Amer: >90 mL/min (ref >90)
Glucose, Bld: 93 mg/dL (ref 70–99)
POTASSIUM: 4.9 meq/L (ref 3.7–5.3)
Sodium: 140 mEq/L (ref 137–147)
Total Bilirubin: 0.5 mg/dL (ref 0.3–1.2)
Total Protein: 7.1 g/dL (ref 6.0–8.3)

## 2014-04-30 LAB — CBC
HCT: 40.9 % (ref 36.0–46.0)
Hemoglobin: 13.6 g/dL (ref 12.0–15.0)
MCH: 29.6 pg (ref 26.0–34.0)
MCHC: 33.3 g/dL (ref 30.0–36.0)
MCV: 89.1 fL (ref 78.0–100.0)
PLATELETS: 306 10*3/uL (ref 150–400)
RBC: 4.59 MIL/uL (ref 3.87–5.11)
RDW: 12.7 % (ref 11.5–15.5)
WBC: 14.8 10*3/uL — ABNORMAL HIGH (ref 4.0–10.5)

## 2014-04-30 LAB — URINALYSIS, ROUTINE W REFLEX MICROSCOPIC
Bilirubin Urine: NEGATIVE
Glucose, UA: NEGATIVE mg/dL
Hgb urine dipstick: NEGATIVE
Ketones, ur: NEGATIVE mg/dL
LEUKOCYTES UA: NEGATIVE
NITRITE: NEGATIVE
Protein, ur: NEGATIVE mg/dL
Specific Gravity, Urine: 1.008 (ref 1.005–1.030)
Urobilinogen, UA: 0.2 mg/dL (ref 0.0–1.0)
pH: 5.5 (ref 5.0–8.0)

## 2014-04-30 LAB — APTT: aPTT: 28 seconds (ref 24–37)

## 2014-04-30 LAB — PROTIME-INR
INR: 0.92 (ref 0.00–1.49)
Prothrombin Time: 12.4 seconds (ref 11.6–15.2)

## 2014-04-30 LAB — SURGICAL PCR SCREEN
MRSA, PCR: NEGATIVE
Staphylococcus aureus: NEGATIVE

## 2014-04-30 NOTE — Progress Notes (Signed)
EKG 06/05/13 EPIC  CXR- 04/30/14 EPIC

## 2014-04-30 NOTE — Progress Notes (Signed)
preop note regaring current sinus infection and on po antibiotics sent with CBC results. CXR result also faxed via EPIC to Dr Wynelle Link.

## 2014-04-30 NOTE — Progress Notes (Signed)
2013  Stress and echo EPIC  06/05/13 Last office visit note with Dr Corky Downs EPIC

## 2014-05-10 ENCOUNTER — Other Ambulatory Visit: Payer: Self-pay | Admitting: Orthopedic Surgery

## 2014-05-10 NOTE — Anesthesia Preprocedure Evaluation (Signed)
Anesthesia Evaluation  Patient identified by MRN, date of birth, ID band Patient awake    Reviewed: Allergy & Precautions, H&P , NPO status , Patient's Chart, lab work & pertinent test results  Airway Mallampati: II TM Distance: >3 FB Neck ROM: Full    Dental no notable dental hx.    Pulmonary neg pulmonary ROS,  breath sounds clear to auscultation  Pulmonary exam normal       Cardiovascular hypertension, Pt. on medications + Peripheral Vascular Disease Rhythm:Regular Rate:Normal     Neuro/Psych negative neurological ROS  negative psych ROS   GI/Hepatic negative GI ROS, Neg liver ROS,   Endo/Other  negative endocrine ROS  Renal/GU negative Renal ROS     Musculoskeletal negative musculoskeletal ROS (+)   Abdominal   Peds  Hematology negative hematology ROS (+)   Anesthesia Other Findings   Reproductive/Obstetrics negative OB ROS                           Anesthesia Physical Anesthesia Plan  ASA: II  Anesthesia Plan: General and Spinal   Post-op Pain Management:    Induction:   Airway Management Planned:   Additional Equipment:   Intra-op Plan:   Post-operative Plan:   Informed Consent: I have reviewed the patients History and Physical, chart, labs and discussed the procedure including the risks, benefits and alternatives for the proposed anesthesia with the patient or authorized representative who has indicated his/her understanding and acceptance.   Dental advisory given  Plan Discussed with: CRNA  Anesthesia Plan Comments:         Anesthesia Quick Evaluation

## 2014-05-10 NOTE — H&P (Signed)
Colleen Shannon. Hettinger DOB: 1956/05/25 Married / Language: English / Race: White Female Date of Admission:  05-11-2014 Chief Complaint:  Right Knee Pain History of Present Illness The patient is a 58 year old female who comes in  for a preoperative History and Physical. The patient is scheduled for a right total knee arthroplasty to be performed by Dr. Dione Plover. Aluisio, MD at [facility] on 06/04/2014. The patient is a 58 year old female who presents for follow up of their knee. The patient is being followed for their right knee pain. Symptoms reported today include: pain, locking and giving way. The patient feels that they are doing poorly and report their pain level to be 8 / 10. The following medication has been used for pain control: antiinflammatory medication. The patient has reported improvement of their symptoms with: conservative measures and Cortisone injections. She has some stabbing pain as well. She had to go to [facility] Urgent care a while back and was given cortisone injection which hasn't helped and Voltaren 75mg . She states the Voltaren is tearing her stomach up. Unfortunately, Iceis's pain is getting a lot worse. She had a marked increase in pain. She had a cortisone injection and it has provided minimal benefit. She has had progressively worsening problems with the knee since we saw her last fall, and she is at a stage where she is ready to get the knee replaced, but she has had this acute flare up now, which has been fairly significant. She is now ready to proceed. They have been treated conservatively in the past for the above stated problem and despite conservative measures, they continue to have progressive pain and severe functional limitations and dysfunction. They have failed non-operative management including home exercise, medications, and injections. It is felt that they would benefit from undergoing total joint replacement. Risks and benefits of the procedure have been  discussed with the patient and they elect to proceed with surgery. There are no active contraindications to surgery such as ongoing infection or rapidly progressive neurological disease.  Allergies  No Known Drug Allergies  Problem List/Past Medical  Osteoarthritis of right knee (715.96  M17.9) Hypercholesterolemia High blood pressure Vertigo Menopause  Family History Cancer grandfather mothers side and grandmother fathers side Osteoarthritis First Degree Relatives. mother Heart Disease grandfather mothers side and grandfather fathers side Hypertension grandfather fathers side  Social History Tobacco use Never smoker. never smoker Children 2 Copy of Drug/Alcohol Rehab (Previously) no Current work status working part time Drug/Alcohol Rehab (Currently) no Exercise Exercises weekly; does running / walking and gym / weights Illicit drug use no Living situation live with spouse Pain Contract no Alcohol use current drinker; drinks wine; only occasionally per week Tobacco / smoke exposure no Marital status married Number of flights of stairs before winded 4-5 Advance Directives Living Will  Medication History  TraMADol HCl (50MG  Tablet, 1-2 Tablet Oral q 6-8 hrs prn pain, Taken starting 04/09/2014) Active. (called to [facility] [id number]) Estrace (Oral) Specific dose unknown - Active. AmLODIPine Besylate (Oral) Specific dose unknown - Active. Lipitor (Oral) Specific dose unknown - Active. Coreg (Oral) Specific dose unknown - Active. Calcium Active. Vitamin D3 Complete (Oral) Active.  Past Surgical History Dilation and Curettage of Uterus Cesarean Delivery 2 times Gallbladder Surgery Date: 2002. laporoscopic Foot Surgery left Abdominal Surgery Date: 1989. Abdominoplasty Tubal Ligation Date: 80.  Review of Systems  General Not Present- Chills, Fatigue, Fever, Memory Loss, Night Sweats, Weight Gain and Weight Loss. Skin Not Present- Eczema,  Hives, Itching, Lesions and Rash. HEENT Not Present- Dentures, Double Vision, Headache, Hearing Loss, Tinnitus and Visual Loss. Respiratory Not Present- Allergies, Chronic Cough, Coughing up blood, Shortness of breath at rest and Shortness of breath with exertion. Cardiovascular Not Present- Chest Pain, Difficulty Breathing Lying Down, Murmur, Palpitations, Racing/skipping heartbeats and Swelling. Gastrointestinal Not Present- Abdominal Pain, Bloody Stool, Constipation, Diarrhea, Difficulty Swallowing, Heartburn, Jaundice, Loss of appetitie, Nausea and Vomiting. Female Genitourinary Not Present- Blood in Urine, Discharge, Flank Pain, Incontinence, Painful Urination, Urgency, Urinary frequency, Urinary Retention, Urinating at Night and Weak urinary stream. Musculoskeletal Present- Joint Pain and Joint Swelling. Not Present- Back Pain, Morning Stiffness, Muscle Pain, Muscle Weakness and Spasms. Neurological Not Present- Blackout spells, Difficulty with balance, Dizziness, Paralysis, Tremor and Weakness. Psychiatric Not Present- Insomnia.   Vitals Weight: 146 lb Height: 61in Body Surface Area: 1.69 m Body Mass Index: 27.59 kg/m Pulse: 64 (Regular)  Resp.: 12 (Unlabored)  BP: 122/72 (Sitting, Right Arm, Standard)    Physical Exam  General Mental Status -Alert, cooperative and good historian. General Appearance-pleasant, Not in acute distress. Orientation-Oriented X3. Build & Nutrition-Petite, Well nourished and Well developed.  Head and Neck Head-normocephalic, atraumatic . Neck Global Assessment - supple, no bruit auscultated on the right, no bruit auscultated on the left.  Eye Pupil - Bilateral-Regular and Round. Motion - Bilateral-EOMI.  Chest and Lung Exam Auscultation Breath sounds - clear at anterior chest wall and clear at posterior chest wall. Adventitious sounds - No Adventitious sounds.  Cardiovascular Auscultation Rhythm - Regular rate and  rhythm. Heart Sounds - S1 WNL and S2 WNL. Murmurs & Other Heart Sounds - Auscultation of the heart reveals - No Murmurs.  Abdomen Palpation/Percussion Tenderness - Abdomen is non-tender to palpation. Rigidity (guarding) - Abdomen is soft. Auscultation Auscultation of the abdomen reveals - Bowel sounds normal.  Female Genitourinary Note: Not done, not pertinent to present illness   Musculoskeletal Note: On exam she is alert and oriented in no apparent distress. Her right knee shows no effusion. She has slight varus. Range about 5 to 125. Very tender medially. There is no lateral tenderness. No instability noted.  RADIOGRAPHS: Reviewed her x rays from about two months ago and she is bone on bone medially, but now also has some patellofemoral bone on bone.  Assessment & Plan  Osteoarthritis of right knee (715.96  M17.9) Impression: Right Knee Note:Plan is for a Right Total Knee Replacement by Dr. Wynelle Link.  Plan is to go home.  PCP - Dr. Shelva Majestic - Patient has been seen preoperatively and felt to be stable for surgery.  The patient does not have any contraindications and will receive TXA (tranexamic acid) prior to surgery.  Signed electronically by Ok Edwards, III PA-C

## 2014-05-11 ENCOUNTER — Encounter (HOSPITAL_COMMUNITY): Payer: BC Managed Care – PPO | Admitting: Anesthesiology

## 2014-05-11 ENCOUNTER — Inpatient Hospital Stay (HOSPITAL_COMMUNITY): Payer: BC Managed Care – PPO | Admitting: Anesthesiology

## 2014-05-11 ENCOUNTER — Inpatient Hospital Stay (HOSPITAL_COMMUNITY)
Admission: RE | Admit: 2014-05-11 | Discharge: 2014-05-13 | DRG: 470 | Disposition: A | Discharge status: Home Health | Payer: BC Managed Care – PPO | Source: Ambulatory Visit | Attending: Orthopedic Surgery | Admitting: Orthopedic Surgery

## 2014-05-11 ENCOUNTER — Encounter (HOSPITAL_COMMUNITY): Payer: Self-pay | Admitting: *Deleted

## 2014-05-11 ENCOUNTER — Encounter (HOSPITAL_COMMUNITY)
Admission: RE | Disposition: A | Discharge status: Home Health | Payer: Self-pay | Source: Ambulatory Visit | Attending: Orthopedic Surgery

## 2014-05-11 DIAGNOSIS — M1710 Unilateral primary osteoarthritis, unspecified knee: Principal | ICD-10-CM | POA: Diagnosis present

## 2014-05-11 DIAGNOSIS — M179 Osteoarthritis of knee, unspecified: Secondary | ICD-10-CM | POA: Diagnosis present

## 2014-05-11 DIAGNOSIS — Z79899 Other long term (current) drug therapy: Secondary | ICD-10-CM

## 2014-05-11 DIAGNOSIS — M1711 Unilateral primary osteoarthritis, right knee: Secondary | ICD-10-CM

## 2014-05-11 DIAGNOSIS — Z6827 Body mass index (BMI) 27.0-27.9, adult: Secondary | ICD-10-CM

## 2014-05-11 DIAGNOSIS — Z96651 Presence of right artificial knee joint: Secondary | ICD-10-CM

## 2014-05-11 DIAGNOSIS — E7800 Pure hypercholesterolemia, unspecified: Secondary | ICD-10-CM | POA: Diagnosis present

## 2014-05-11 DIAGNOSIS — I10 Essential (primary) hypertension: Secondary | ICD-10-CM | POA: Diagnosis present

## 2014-05-11 HISTORY — PX: TOTAL KNEE ARTHROPLASTY: SHX125

## 2014-05-11 LAB — ABO/RH: ABO/RH(D): A POS

## 2014-05-11 LAB — TYPE AND SCREEN
ABO/RH(D): A POS
Antibody Screen: NEGATIVE

## 2014-05-11 SURGERY — ARTHROPLASTY, KNEE, TOTAL
Anesthesia: General | Site: Knee | Laterality: Right

## 2014-05-11 MED ORDER — CARVEDILOL 6.25 MG PO TABS
6.2500 mg | ORAL_TABLET | Freq: Two times a day (BID) | ORAL | Status: DC
  Administered 2014-05-11 – 2014-05-13 (×4): 6.25 mg via ORAL
  Filled 2014-05-11 (×6): qty 1

## 2014-05-11 MED ORDER — KETOROLAC TROMETHAMINE 15 MG/ML IJ SOLN
7.5000 mg | Freq: Four times a day (QID) | INTRAMUSCULAR | Status: AC | PRN
Start: 2014-05-11 — End: 2014-05-12
  Administered 2014-05-11 – 2014-05-12 (×2): 7.5 mg via INTRAVENOUS
  Filled 2014-05-11 (×2): qty 1

## 2014-05-11 MED ORDER — BUPIVACAINE HCL 0.25 % IJ SOLN
INTRAMUSCULAR | Status: DC | PRN
  Administered 2014-05-11: 30 mL

## 2014-05-11 MED ORDER — BUPIVACAINE LIPOSOME 1.3 % IJ SUSP
INTRAMUSCULAR | Status: DC | PRN
  Administered 2014-05-11: 20 mL

## 2014-05-11 MED ORDER — RIVAROXABAN 10 MG PO TABS
10.0000 mg | ORAL_TABLET | Freq: Every day | ORAL | Status: DC
  Administered 2014-05-12 – 2014-05-13 (×2): 10 mg via ORAL
  Filled 2014-05-11 (×3): qty 1

## 2014-05-11 MED ORDER — SODIUM CHLORIDE 0.9 % IR SOLN
Status: DC | PRN
  Administered 2014-05-11: 1000 mL

## 2014-05-11 MED ORDER — PHENOL 1.4 % MT LIQD
1.0000 | OROMUCOSAL | Status: DC | PRN
  Filled 2014-05-11: qty 177

## 2014-05-11 MED ORDER — SODIUM CHLORIDE 0.9 % IJ SOLN
INTRAMUSCULAR | Status: DC | PRN
  Administered 2014-05-11: 30 mL

## 2014-05-11 MED ORDER — FENTANYL CITRATE 0.05 MG/ML IJ SOLN
INTRAMUSCULAR | Status: AC
  Filled 2014-05-11: qty 2

## 2014-05-11 MED ORDER — DEXAMETHASONE SODIUM PHOSPHATE 10 MG/ML IJ SOLN
10.0000 mg | Freq: Every day | INTRAMUSCULAR | Status: AC
  Filled 2014-05-11: qty 1

## 2014-05-11 MED ORDER — CEFAZOLIN SODIUM-DEXTROSE 2-3 GM-% IV SOLR
INTRAVENOUS | Status: AC
  Filled 2014-05-11: qty 50

## 2014-05-11 MED ORDER — PROPOFOL 10 MG/ML IV BOLUS
INTRAVENOUS | Status: AC
  Filled 2014-05-11: qty 20

## 2014-05-11 MED ORDER — OXYCODONE HCL 5 MG/5ML PO SOLN
5.0000 mg | Freq: Once | ORAL | Status: DC | PRN
  Filled 2014-05-11: qty 5

## 2014-05-11 MED ORDER — DEXAMETHASONE SODIUM PHOSPHATE 10 MG/ML IJ SOLN: 10.0000 mg | Freq: Once | INTRAMUSCULAR | Status: DC

## 2014-05-11 MED ORDER — FLEET ENEMA 7-19 GM/118ML RE ENEM: 1.0000 | ENEMA | Freq: Once | RECTAL | Status: AC | PRN

## 2014-05-11 MED ORDER — TRANEXAMIC ACID 100 MG/ML IV SOLN
1000.0000 mg | INTRAVENOUS | Status: AC
  Administered 2014-05-11: 1000 mg via INTRAVENOUS
  Filled 2014-05-11: qty 10

## 2014-05-11 MED ORDER — MEPERIDINE HCL 50 MG/ML IJ SOLN: 6.2500 mg | INTRAMUSCULAR | Status: DC | PRN

## 2014-05-11 MED ORDER — TRAMADOL HCL 50 MG PO TABS: 50.0000 mg | ORAL_TABLET | Freq: Four times a day (QID) | ORAL | Status: DC | PRN

## 2014-05-11 MED ORDER — MORPHINE SULFATE 2 MG/ML IJ SOLN
1.0000 mg | INTRAMUSCULAR | Status: DC | PRN
  Administered 2014-05-11: 2 mg via INTRAVENOUS
  Administered 2014-05-11: 1 mg via INTRAVENOUS
  Filled 2014-05-11 (×2): qty 1

## 2014-05-11 MED ORDER — CEFAZOLIN SODIUM 1-5 GM-% IV SOLN
1.0000 g | Freq: Four times a day (QID) | INTRAVENOUS | Status: AC
  Administered 2014-05-11 (×2): 1 g via INTRAVENOUS
  Filled 2014-05-11 (×2): qty 50

## 2014-05-11 MED ORDER — ONDANSETRON HCL 4 MG PO TABS: 4.0000 mg | ORAL_TABLET | Freq: Four times a day (QID) | ORAL | Status: DC | PRN

## 2014-05-11 MED ORDER — SODIUM CHLORIDE 0.9 % IJ SOLN
INTRAMUSCULAR | Status: AC
  Filled 2014-05-11: qty 50

## 2014-05-11 MED ORDER — SODIUM CHLORIDE 0.9 % IV SOLN: INTRAVENOUS | Status: DC

## 2014-05-11 MED ORDER — METOCLOPRAMIDE HCL 5 MG/ML IJ SOLN
5.0000 mg | Freq: Three times a day (TID) | INTRAMUSCULAR | Status: DC | PRN
  Administered 2014-05-11: 10 mg via INTRAVENOUS
  Filled 2014-05-11: qty 2

## 2014-05-11 MED ORDER — LACTATED RINGERS IV SOLN: INTRAVENOUS | Status: DC

## 2014-05-11 MED ORDER — HYDROMORPHONE HCL PF 1 MG/ML IJ SOLN: 0.2500 mg | INTRAMUSCULAR | Status: DC | PRN

## 2014-05-11 MED ORDER — PROMETHAZINE HCL 25 MG/ML IJ SOLN
12.5000 mg | Freq: Four times a day (QID) | INTRAMUSCULAR | Status: DC | PRN
  Administered 2014-05-11: 12.5 mg via INTRAVENOUS
  Filled 2014-05-11: qty 1

## 2014-05-11 MED ORDER — CHLORHEXIDINE GLUCONATE 4 % EX LIQD: 60.0000 mL | Freq: Once | CUTANEOUS | Status: DC

## 2014-05-11 MED ORDER — BUPIVACAINE IN DEXTROSE 0.75-8.25 % IT SOLN
INTRATHECAL | Status: DC | PRN
  Administered 2014-05-11: 2 mL via INTRATHECAL

## 2014-05-11 MED ORDER — AMLODIPINE BESYLATE 5 MG PO TABS
5.0000 mg | ORAL_TABLET | Freq: Every day | ORAL | Status: DC
  Administered 2014-05-11 – 2014-05-12 (×2): 5 mg via ORAL
  Filled 2014-05-11 (×3): qty 1

## 2014-05-11 MED ORDER — METHOCARBAMOL 500 MG PO TABS
500.0000 mg | ORAL_TABLET | Freq: Four times a day (QID) | ORAL | Status: DC | PRN
  Administered 2014-05-12 (×2): 500 mg via ORAL
  Filled 2014-05-11 (×2): qty 1

## 2014-05-11 MED ORDER — DIPHENHYDRAMINE HCL 12.5 MG/5ML PO ELIX: 12.5000 mg | ORAL_SOLUTION | ORAL | Status: DC | PRN

## 2014-05-11 MED ORDER — ATORVASTATIN CALCIUM 40 MG PO TABS
40.0000 mg | ORAL_TABLET | Freq: Every day | ORAL | Status: DC
  Administered 2014-05-11 – 2014-05-13 (×3): 40 mg via ORAL
  Filled 2014-05-11 (×3): qty 1

## 2014-05-11 MED ORDER — PHENYLEPHRINE HCL 10 MG/ML IJ SOLN
INTRAMUSCULAR | Status: DC | PRN
  Administered 2014-05-11: 80 ug via INTRAVENOUS
  Administered 2014-05-11: 120 ug via INTRAVENOUS

## 2014-05-11 MED ORDER — DEXAMETHASONE 6 MG PO TABS
10.0000 mg | ORAL_TABLET | Freq: Every day | ORAL | Status: AC
  Administered 2014-05-12: 10 mg via ORAL
  Filled 2014-05-11: qty 1

## 2014-05-11 MED ORDER — ONDANSETRON HCL 4 MG/2ML IJ SOLN
INTRAMUSCULAR | Status: DC | PRN
  Administered 2014-05-11: 4 mg via INTRAVENOUS

## 2014-05-11 MED ORDER — HYDROMORPHONE HCL PF 1 MG/ML IJ SOLN
INTRAMUSCULAR | Status: AC
  Filled 2014-05-11: qty 1

## 2014-05-11 MED ORDER — POLYETHYLENE GLYCOL 3350 17 G PO PACK: 17.0000 g | PACK | Freq: Every day | ORAL | Status: DC | PRN

## 2014-05-11 MED ORDER — FENTANYL CITRATE 0.05 MG/ML IJ SOLN
INTRAMUSCULAR | Status: DC | PRN
  Administered 2014-05-11: 50 ug via INTRAVENOUS

## 2014-05-11 MED ORDER — DEXTROSE-NACL 5-0.9 % IV SOLN
INTRAVENOUS | Status: DC
  Administered 2014-05-11 – 2014-05-12 (×2): via INTRAVENOUS

## 2014-05-11 MED ORDER — BUPIVACAINE LIPOSOME 1.3 % IJ SUSP
20.0000 mL | Freq: Once | INTRAMUSCULAR | Status: DC
  Filled 2014-05-11: qty 20

## 2014-05-11 MED ORDER — PHENYLEPHRINE 40 MCG/ML (10ML) SYRINGE FOR IV PUSH (FOR BLOOD PRESSURE SUPPORT)
PREFILLED_SYRINGE | INTRAVENOUS | Status: AC
  Filled 2014-05-11: qty 10

## 2014-05-11 MED ORDER — PROMETHAZINE HCL 25 MG/ML IJ SOLN: 6.2500 mg | INTRAMUSCULAR | Status: DC | PRN

## 2014-05-11 MED ORDER — OXYCODONE HCL 5 MG PO TABS
5.0000 mg | ORAL_TABLET | ORAL | Status: DC | PRN
  Administered 2014-05-11 – 2014-05-12 (×3): 10 mg via ORAL
  Administered 2014-05-12: 5 mg via ORAL
  Administered 2014-05-12 – 2014-05-13 (×8): 10 mg via ORAL
  Filled 2014-05-11 (×3): qty 2
  Filled 2014-05-11: qty 1
  Filled 2014-05-11 (×8): qty 2

## 2014-05-11 MED ORDER — HYDROMORPHONE HCL PF 1 MG/ML IJ SOLN
0.5000 mg | INTRAMUSCULAR | Status: DC | PRN
  Administered 2014-05-11: 0.5 mg via INTRAVENOUS
  Administered 2014-05-11 – 2014-05-12 (×3): 1 mg via INTRAVENOUS
  Filled 2014-05-11 (×4): qty 1

## 2014-05-11 MED ORDER — PROPOFOL 10 MG/ML IV BOLUS
INTRAVENOUS | Status: DC | PRN
  Administered 2014-05-11: 30 mg via INTRAVENOUS

## 2014-05-11 MED ORDER — PROPOFOL INFUSION 10 MG/ML OPTIME
INTRAVENOUS | Status: DC | PRN
  Administered 2014-05-11: 140 ug/kg/min via INTRAVENOUS

## 2014-05-11 MED ORDER — ACETAMINOPHEN 325 MG PO TABS
650.0000 mg | ORAL_TABLET | Freq: Four times a day (QID) | ORAL | Status: DC | PRN
  Administered 2014-05-12: 650 mg via ORAL
  Filled 2014-05-11: qty 2

## 2014-05-11 MED ORDER — ONDANSETRON HCL 4 MG/2ML IJ SOLN
INTRAMUSCULAR | Status: AC
  Filled 2014-05-11: qty 2

## 2014-05-11 MED ORDER — BISACODYL 10 MG RE SUPP: 10.0000 mg | Freq: Every day | RECTAL | Status: DC | PRN

## 2014-05-11 MED ORDER — OXYCODONE HCL 5 MG PO TABS: 5.0000 mg | ORAL_TABLET | Freq: Once | ORAL | Status: DC | PRN

## 2014-05-11 MED ORDER — DOCUSATE SODIUM 100 MG PO CAPS
100.0000 mg | ORAL_CAPSULE | Freq: Two times a day (BID) | ORAL | Status: DC
  Administered 2014-05-11 – 2014-05-13 (×5): 100 mg via ORAL

## 2014-05-11 MED ORDER — BUPIVACAINE HCL (PF) 0.25 % IJ SOLN
INTRAMUSCULAR | Status: AC
  Filled 2014-05-11: qty 30

## 2014-05-11 MED ORDER — DEXTROSE 5 % IV SOLN
500.0000 mg | Freq: Four times a day (QID) | INTRAVENOUS | Status: DC | PRN
  Administered 2014-05-11: 500 mg via INTRAVENOUS
  Filled 2014-05-11: qty 5

## 2014-05-11 MED ORDER — ACETAMINOPHEN 500 MG PO TABS
1000.0000 mg | ORAL_TABLET | Freq: Four times a day (QID) | ORAL | Status: AC
  Administered 2014-05-11 – 2014-05-12 (×4): 1000 mg via ORAL
  Filled 2014-05-11 (×5): qty 2

## 2014-05-11 MED ORDER — MIDAZOLAM HCL 5 MG/5ML IJ SOLN
INTRAMUSCULAR | Status: DC | PRN
  Administered 2014-05-11: 0.5 mg via INTRAVENOUS
  Administered 2014-05-11: 1.5 mg via INTRAVENOUS

## 2014-05-11 MED ORDER — CEFAZOLIN SODIUM-DEXTROSE 2-3 GM-% IV SOLR
2.0000 g | INTRAVENOUS | Status: AC
  Administered 2014-05-11: 2 g via INTRAVENOUS

## 2014-05-11 MED ORDER — ACETAMINOPHEN 650 MG RE SUPP: 650.0000 mg | Freq: Four times a day (QID) | RECTAL | Status: DC | PRN

## 2014-05-11 MED ORDER — LACTATED RINGERS IV SOLN
INTRAVENOUS | Status: DC | PRN
  Administered 2014-05-11 (×4): via INTRAVENOUS

## 2014-05-11 MED ORDER — ACETAMINOPHEN 10 MG/ML IV SOLN
1000.0000 mg | Freq: Once | INTRAVENOUS | Status: AC
  Administered 2014-05-11: 1000 mg via INTRAVENOUS
  Filled 2014-05-11: qty 100

## 2014-05-11 MED ORDER — MIDAZOLAM HCL 2 MG/2ML IJ SOLN
INTRAMUSCULAR | Status: AC
  Filled 2014-05-11: qty 2

## 2014-05-11 MED ORDER — ONDANSETRON HCL 4 MG/2ML IJ SOLN
4.0000 mg | Freq: Four times a day (QID) | INTRAMUSCULAR | Status: DC | PRN
  Administered 2014-05-11 (×2): 4 mg via INTRAVENOUS
  Filled 2014-05-11 (×2): qty 2

## 2014-05-11 MED ORDER — MENTHOL 3 MG MT LOZG
1.0000 | LOZENGE | OROMUCOSAL | Status: DC | PRN
  Filled 2014-05-11: qty 9

## 2014-05-11 MED ORDER — METOCLOPRAMIDE HCL 10 MG PO TABS: 5.0000 mg | ORAL_TABLET | Freq: Three times a day (TID) | ORAL | Status: DC | PRN

## 2014-05-11 SURGICAL SUPPLY — 61 items
BAG SPEC THK2 15X12 ZIP CLS (MISCELLANEOUS) ×1
BAG ZIPLOCK 12X15 (MISCELLANEOUS) ×2 IMPLANT
BANDAGE ELASTIC 6 VELCRO ST LF (GAUZE/BANDAGES/DRESSINGS) ×2 IMPLANT
BANDAGE ESMARK 6X9 LF (GAUZE/BANDAGES/DRESSINGS) ×1 IMPLANT
BLADE SAG 18X100X1.27 (BLADE) ×2 IMPLANT
BLADE SAW SGTL 11.0X1.19X90.0M (BLADE) ×2 IMPLANT
BNDG CMPR 9X6 STRL LF SNTH (GAUZE/BANDAGES/DRESSINGS) ×1
BNDG ESMARK 6X9 LF (GAUZE/BANDAGES/DRESSINGS) ×2
BOWL SMART MIX CTS (DISPOSABLE) ×2 IMPLANT
CAP KNEE ATTUNE RP ×1 IMPLANT
CEMENT HV SMART SET (Cement) ×4 IMPLANT
CUFF TOURN SGL QUICK 34 (TOURNIQUET CUFF) ×2
CUFF TRNQT CYL 34X4X40X1 (TOURNIQUET CUFF) ×1 IMPLANT
DECANTER SPIKE VIAL GLASS SM (MISCELLANEOUS) ×2 IMPLANT
DRAPE EXTREMITY T 121X128X90 (DRAPE) ×2 IMPLANT
DRAPE POUCH INSTRU U-SHP 10X18 (DRAPES) ×2 IMPLANT
DRAPE U-SHAPE 47X51 STRL (DRAPES) ×2 IMPLANT
DRSG ADAPTIC 3X8 NADH LF (GAUZE/BANDAGES/DRESSINGS) ×2 IMPLANT
DRSG PAD ABDOMINAL 8X10 ST (GAUZE/BANDAGES/DRESSINGS) ×1 IMPLANT
DURAPREP 26ML APPLICATOR (WOUND CARE) ×2 IMPLANT
ELECT REM PT RETURN 9FT ADLT (ELECTROSURGICAL) ×2
ELECTRODE REM PT RTRN 9FT ADLT (ELECTROSURGICAL) ×1 IMPLANT
EVACUATOR 1/8 PVC DRAIN (DRAIN) ×2 IMPLANT
FACESHIELD WRAPAROUND (MASK) ×10 IMPLANT
FACESHIELD WRAPAROUND OR TEAM (MASK) ×5 IMPLANT
GAUZE SPONGE 4X4 12PLY STRL (GAUZE/BANDAGES/DRESSINGS) ×1 IMPLANT
GLOVE BIO SURGEON STRL SZ7.5 (GLOVE) IMPLANT
GLOVE BIO SURGEON STRL SZ8 (GLOVE) ×2 IMPLANT
GLOVE BIOGEL PI IND STRL 6.5 (GLOVE) IMPLANT
GLOVE BIOGEL PI IND STRL 8 (GLOVE) ×1 IMPLANT
GLOVE BIOGEL PI INDICATOR 6.5 (GLOVE)
GLOVE BIOGEL PI INDICATOR 8 (GLOVE) ×1
GLOVE SURG SS PI 6.5 STRL IVOR (GLOVE) IMPLANT
GOWN STRL REUS W/TWL LRG LVL3 (GOWN DISPOSABLE) ×2 IMPLANT
GOWN STRL REUS W/TWL XL LVL3 (GOWN DISPOSABLE) IMPLANT
HANDPIECE INTERPULSE COAX TIP (DISPOSABLE) ×2
IMMOBILIZER KNEE 20 (SOFTGOODS) ×1 IMPLANT
IMMOBILIZER KNEE 20 THIGH 36 (SOFTGOODS) ×1 IMPLANT
KIT BASIN OR (CUSTOM PROCEDURE TRAY) ×2 IMPLANT
MANIFOLD NEPTUNE II (INSTRUMENTS) ×2 IMPLANT
NDL SAFETY ECLIPSE 18X1.5 (NEEDLE) ×2 IMPLANT
NEEDLE HYPO 18GX1.5 SHARP (NEEDLE) ×4
NS IRRIG 1000ML POUR BTL (IV SOLUTION) ×2 IMPLANT
PACK TOTAL JOINT (CUSTOM PROCEDURE TRAY) ×2 IMPLANT
PAD ABD 8X10 STRL (GAUZE/BANDAGES/DRESSINGS) ×1 IMPLANT
PADDING CAST COTTON 6X4 STRL (CAST SUPPLIES) ×3 IMPLANT
POSITIONER SURGICAL ARM (MISCELLANEOUS) ×2 IMPLANT
SET HNDPC FAN SPRY TIP SCT (DISPOSABLE) ×1 IMPLANT
STRIP CLOSURE SKIN 1/2X4 (GAUZE/BANDAGES/DRESSINGS) ×3 IMPLANT
SUCTION FRAZIER 12FR DISP (SUCTIONS) ×2 IMPLANT
SUT MNCRL AB 4-0 PS2 18 (SUTURE) ×2 IMPLANT
SUT VIC AB 2-0 CT1 27 (SUTURE) ×6
SUT VIC AB 2-0 CT1 TAPERPNT 27 (SUTURE) ×3 IMPLANT
SUT VLOC 180 0 24IN GS25 (SUTURE) ×2 IMPLANT
SYRINGE 20CC LL (MISCELLANEOUS) ×2 IMPLANT
SYRINGE 60CC LL (MISCELLANEOUS) ×2 IMPLANT
TOWEL OR 17X26 10 PK STRL BLUE (TOWEL DISPOSABLE) ×2 IMPLANT
TOWEL OR NON WOVEN STRL DISP B (DISPOSABLE) IMPLANT
TRAY FOLEY CATH 14FRSI W/METER (CATHETERS) ×2 IMPLANT
WATER STERILE IRR 1500ML POUR (IV SOLUTION) ×2 IMPLANT
WRAP KNEE MAXI GEL POST OP (GAUZE/BANDAGES/DRESSINGS) ×2 IMPLANT

## 2014-05-11 NOTE — Evaluation (Signed)
Physical Therapy Evaluation Patient Details Name: Colleen Shannon MRN: [id number] DOB: 08-16-1956 Today's Date: 05/11/2014   History of Present Illness  58 yo female s/p R TKA 05/11/14  Clinical Impression  On eval POD 0, pt required Min assist for mobility-only able to take a few steps in room with walker. Mobility limited by nausea, vomiting. Anticipate pt will progress well during stay. Recommend HHPT. May need a RW    Follow Up Recommendations Home health PT    Equipment Recommendations  Rolling walker with 5" wheels (possibly-pt may borrow)    Recommendations for Other Services OT consult     Precautions / Restrictions Precautions Precautions: Knee Required Braces or Orthoses: Knee Immobilizer - Right Knee Immobilizer - Right: Discontinue once straight leg raise with < 10 degree lag Restrictions Weight Bearing Restrictions: No RLE Weight Bearing: Weight bearing as tolerated      Mobility  Bed Mobility Overal bed mobility: Needs Assistance Bed Mobility: Supine to Sit;Sit to Supine     Supine to sit: Min assist Sit to supine: Min assist   General bed mobility comments: Assist for R LE   Transfers Overall transfer level: Needs assistance Equipment used: Rolling walker (2 wheeled) Transfers: Sit to/from Stand Sit to Stand: Min assist         General transfer comment: Assist to rise, stabilize, control descent. VCs safety, technique, hand placement  Ambulation/Gait Ambulation/Gait assistance: Min assist Ambulation Distance (Feet): 4 Feet (4 steps fwd, 4 steps backward) Assistive device: Rolling walker (2 wheeled) Gait Pattern/deviations: Step-to pattern;Antalgic;Decreased stride length     General Gait Details: assist to stabilize. Pt unable to ambulate farther due to nausea/vomiting.   Stairs            Wheelchair Mobility    Modified Rankin (Stroke Patients Only)       Balance                                              Pertinent Vitals/Pain R knee 6/10 with activity. Ice applied end of session    Lynn expects to be discharged to:: Private residence Living Arrangements: Spouse/significant other   Type of Home: House Home Access: Stairs to enter Entrance Stairs-Rails: Can reach both;Right;Left Entrance Stairs-Number of Steps: 5 Home Layout: Two level;Able to live on main level with bedroom/bathroom Home Equipment: Kasandra Knudsen - single point;Crutches;Walker - 4 wheels Additional Comments: has borrowed DME    Prior Function Level of Independence: Independent               Hand Dominance        Extremity/Trunk Assessment   Upper Extremity Assessment: Overall WFL for tasks assessed           Lower Extremity Assessment: RLE deficits/detail RLE Deficits / Details: hip flex 2/5, hip abd/add 2/5, moves ankle well.     Cervical / Trunk Assessment: Normal  Communication   Communication: No difficulties  Cognition Arousal/Alertness: Awake/alert Behavior During Therapy: WFL for tasks assessed/performed Overall Cognitive Status: Within Functional Limits for tasks assessed                      General Comments      Exercises        Assessment/Plan    PT Assessment Patient needs continued PT services  PT Diagnosis Difficulty walking;Abnormality of gait;Acute pain  PT Problem List Decreased strength;Decreased range of motion;Decreased activity tolerance;Decreased balance;Decreased mobility;Decreased knowledge of use of DME;Pain;Decreased knowledge of precautions  PT Treatment Interventions DME instruction;Gait training;Stair training;Functional mobility training;Therapeutic activities;Balance training;Patient/family education   PT Goals (Current goals can be found in the Care Plan section) Acute Rehab PT Goals Patient Stated Goal: home. less pain PT Goal Formulation: With patient Time For Goal Achievement: 05/18/14 Potential to Achieve Goals: Good     Frequency 7X/week   Barriers to discharge        Co-evaluation               End of Session Equipment Utilized During Treatment: Gait belt Activity Tolerance: Patient tolerated treatment well Patient left: in bed;with call bell/phone within reach;with family/visitor present           Time: 2595-6387 PT Time Calculation (min): 23 min   Charges:   PT Evaluation $Initial PT Evaluation Tier I: 1 Procedure PT Treatments $Gait Training: 8-22 mins $Therapeutic Activity: 8-22 mins   PT G Codes:          Weston Anna, MPT Pager: [phone]

## 2014-05-11 NOTE — H&P (View-Only) (Signed)
Colleen Shannon DOB: 1955-12-28 Married / Language: English / Race: White Female Date of Admission:  05-11-2014 Chief Complaint:  Right Knee Pain History of Present Illness The patient is a 58 year old female who comes in  for a preoperative History and Physical. The patient is scheduled for a right total knee arthroplasty to be performed by Dr. Dione Plover. Aluisio, MD at [facility] on 06/04/2014. The patient is a 58 year old female who presents for follow up of their knee. The patient is being followed for their right knee pain. Symptoms reported today include: pain, locking and giving way. The patient feels that they are doing poorly and report their pain level to be 8 / 10. The following medication has been used for pain control: antiinflammatory medication. The patient has reported improvement of their symptoms with: conservative measures and Cortisone injections. She has some stabbing pain as well. She had to go to [facility] a while back and was given cortisone injection which hasn't helped and Voltaren 75mg . She states the Voltaren is tearing her stomach up. Unfortunately, Colleen Shannon's pain is getting a lot worse. She had a marked increase in pain. She had a cortisone injection and it has provided minimal benefit. She has had progressively worsening problems with the knee since we saw her last fall, and she is at a stage where she is ready to get the knee replaced, but she has had this acute flare up now, which has been fairly significant. She is now ready to proceed. They have been treated conservatively in the past for the above stated problem and despite conservative measures, they continue to have progressive pain and severe functional limitations and dysfunction. They have failed non-operative management including home exercise, medications, and injections. It is felt that they would benefit from undergoing total joint replacement. Risks and benefits of the procedure have been  discussed with the patient and they elect to proceed with surgery. There are no active contraindications to surgery such as ongoing infection or rapidly progressive neurological disease.  Allergies  No Known Drug Allergies  Problem List/Past Medical  Osteoarthritis of right knee (715.96  M17.9) Hypercholesterolemia High blood pressure Vertigo Menopause  Family History Cancer grandfather mothers side and grandmother fathers side Osteoarthritis First Degree Relatives. mother Heart Disease grandfather mothers side and grandfather fathers side Hypertension grandfather fathers side  Social History Tobacco use Never smoker. never smoker Children 2 Copy of Drug/Alcohol Rehab (Previously) no Current work status working part time Drug/Alcohol Rehab (Currently) no Exercise Exercises weekly; does running / walking and gym / weights Illicit drug use no Living situation live with spouse Pain Contract no Alcohol use current drinker; drinks wine; only occasionally per week Tobacco / smoke exposure no Marital status married Number of flights of stairs before winded 4-5 Advance Directives Living Will  Medication History  TraMADol HCl (50MG  Tablet, 1-2 Tablet Oral q 6-8 hrs prn pain, Taken starting 04/09/2014) Active. (called to [facility] [phone]) Estrace (Oral) Specific dose unknown - Active. AmLODIPine Besylate (Oral) Specific dose unknown - Active. Lipitor (Oral) Specific dose unknown - Active. Coreg (Oral) Specific dose unknown - Active. Calcium Active. Vitamin D3 Complete (Oral) Active.  Past Surgical History Dilation and Curettage of Uterus Cesarean Delivery 2 times Gallbladder Surgery Date: 2002. laporoscopic Foot Surgery left Abdominal Surgery Date: 1989. Abdominoplasty Tubal Ligation Date: 80.  Review of Systems  General Not Present- Chills, Fatigue, Fever, Memory Loss, Night Sweats, Weight Gain and Weight Loss. Skin Not Present- Eczema,  Hives, Itching, Lesions and Rash. HEENT Not Present- Dentures, Double Vision, Headache, Hearing Loss, Tinnitus and Visual Loss. Respiratory Not Present- Allergies, Chronic Cough, Coughing up blood, Shortness of breath at rest and Shortness of breath with exertion. Cardiovascular Not Present- Chest Pain, Difficulty Breathing Lying Down, Murmur, Palpitations, Racing/skipping heartbeats and Swelling. Gastrointestinal Not Present- Abdominal Pain, Bloody Stool, Constipation, Diarrhea, Difficulty Swallowing, Heartburn, Jaundice, Loss of appetitie, Nausea and Vomiting. Female Genitourinary Not Present- Blood in Urine, Discharge, Flank Pain, Incontinence, Painful Urination, Urgency, Urinary frequency, Urinary Retention, Urinating at Night and Weak urinary stream. Musculoskeletal Present- Joint Pain and Joint Swelling. Not Present- Back Pain, Morning Stiffness, Muscle Pain, Muscle Weakness and Spasms. Neurological Not Present- Blackout spells, Difficulty with balance, Dizziness, Paralysis, Tremor and Weakness. Psychiatric Not Present- Insomnia.   Vitals Weight: 146 lb Height: 61in Body Surface Area: 1.69 m Body Mass Index: 27.59 kg/m Pulse: 64 (Regular)  Resp.: 12 (Unlabored)  BP: 122/72 (Sitting, Right Arm, Standard)    Physical Exam  General Mental Status -Alert, cooperative and good historian. General Appearance-pleasant, Not in acute distress. Orientation-Oriented X3. Build & Nutrition-Petite, Well nourished and Well developed.  Head and Neck Head-normocephalic, atraumatic . Neck Global Assessment - supple, no bruit auscultated on the right, no bruit auscultated on the left.  Eye Pupil - Bilateral-Regular and Round. Motion - Bilateral-EOMI.  Chest and Lung Exam Auscultation Breath sounds - clear at anterior chest wall and clear at posterior chest wall. Adventitious sounds - No Adventitious sounds.  Cardiovascular Auscultation Rhythm - Regular rate and  rhythm. Heart Sounds - S1 WNL and S2 WNL. Murmurs & Other Heart Sounds - Auscultation of the heart reveals - No Murmurs.  Abdomen Palpation/Percussion Tenderness - Abdomen is non-tender to palpation. Rigidity (guarding) - Abdomen is soft. Auscultation Auscultation of the abdomen reveals - Bowel sounds normal.  Female Genitourinary Note: Not done, not pertinent to present illness   Musculoskeletal Note: On exam she is alert and oriented in no apparent distress. Her right knee shows no effusion. She has slight varus. Range about 5 to 125. Very tender medially. There is no lateral tenderness. No instability noted.  RADIOGRAPHS: Reviewed her x rays from about two months ago and she is bone on bone medially, but now also has some patellofemoral bone on bone.  Assessment & Plan  Osteoarthritis of right knee (715.96  M17.9) Impression: Right Knee Note:Plan is for a Right Total Knee Replacement by Dr. Wynelle Link.  Plan is to go home.  PCP - Dr. Shelva Majestic - Patient has been seen preoperatively and felt to be stable for surgery.  The patient does not have any contraindications and will receive TXA (tranexamic acid) prior to surgery.  Signed electronically by Ok Edwards, III PA-C

## 2014-05-11 NOTE — Plan of Care (Signed)
Problem: Consults Goal: Diagnosis- Total Joint Replacement Right total knee     

## 2014-05-11 NOTE — Anesthesia Procedure Notes (Signed)
Spinal  Patient location during procedure: OR Staffing Anesthesiologist: Nolon Nations R Performed by: anesthesiologist  Preanesthetic Checklist Completed: patient identified, site marked, surgical consent, pre-op evaluation, timeout performed, IV checked, risks and benefits discussed and monitors and equipment checked Spinal Block Patient position: sitting Prep: Betadine Patient monitoring: heart rate, continuous pulse ox and blood pressure Location: L3-4 Injection technique: single-shot Needle Needle type: Sprotte  Needle gauge: 24 G Needle length: 9 cm Assessment Sensory level: T8 Additional Notes Expiration date of kit checked and confirmed. Patient tolerated procedure well, without complications.

## 2014-05-11 NOTE — Op Note (Addendum)
Pre-operative diagnosis- Osteoarthritis  Right knee(s)  Post-operative diagnosis- Osteoarthritis Right knee(s)  Procedure-  Right  Total Knee Arthroplasty (Attune system)  Surgeon- Dione Plover. Verdie Barrows, MD  Assistant- Ardeen Jourdain, PA-C   Anesthesia-  Spinal  EBL-* No blood loss amount entered *   Drains Hemovac  Tourniquet time-  Total Tourniquet Time Documented: Thigh (Right) - 33 minutes Total: Thigh (Right) - 33 minutes     Complications- None  Condition-PACU - hemodynamically stable.   Brief Clinical Note  Colleen Shannon is a 58 y.o. year old female with end stage OA of her right knee with progressively worsening pain and dysfunction. She has constant pain, with activity and at rest and significant functional deficits with difficulties even with ADLs. She has had extensive non-op management including analgesics, injections of cortisone and viscosupplements, and home exercise program, but remains in significant pain with significant dysfunction.Radiographs show bone on bone arthritis medial and patellofemoral. She presents now for right Total Knee Arthroplasty.    Procedure in detail---   The patient is brought into the operating room and positioned supine on the operating table. After successful administration of  Spinal,   a tourniquet is placed high on the  Right thigh(s) and the lower extremity is prepped and draped in the usual sterile fashion. Time out is performed by the operating team and then the  Right lower extremity is wrapped in Esmarch, knee flexed and the tourniquet inflated to 300 mmHg.       A midline incision is made with a ten blade through the subcutaneous tissue to the level of the extensor mechanism. A fresh blade is used to make a medial parapatellar arthrotomy. Soft tissue over the proximal medial tibia is subperiosteally elevated to the joint line with a knife and into the semimembranosus bursa with a Cobb elevator. Soft tissue over the proximal lateral  tibia is elevated with attention being paid to avoiding the patellar tendon on the tibial tubercle. The patella is everted, knee flexed 90 degrees and the ACL and PCL are removed. Findings are bone on bone medial and patellofemoral with large medial osteophytes.        The drill is used to create a starting hole in the distal femur and the canal is thoroughly irrigated with sterile saline to remove the fatty contents. The 5 degree Right  valgus alignment guide is placed into the femoral canal and the distal femoral cutting block is pinned to remove 9 mm off the distal femur. Resection is made with an oscillating saw.      The tibia is subluxed forward and the menisci are removed. The extramedullary alignment guide is placed referencing proximally at the medial aspect of the tibial tubercle and distally along the second metatarsal axis and tibial crest. The block is pinned to remove 9mm off the more deficient medial  side. Resection is made with an oscillating saw. Size 3is the most appropriate size for the tibia and the proximal tibia is prepared with the modular drill and keel punch for that size.      The femoral sizing guide is placed and size 3 is most appropriate. Rotation is marked off the epicondylar axis and confirmed by creating a rectangular flexion gap at 90 degrees. The size 3 cutting block is pinned in this rotation and the anterior, posterior and chamfer cuts are made with the oscillating saw. The intercondylar block is then placed and that cut is made.      Trial size 3 tibial  component, trial size 3 posterior stabilized femur and a 10  mm posterior stabilized rotating platform insert trial is placed. Full extension is achieved with excellent varus/valgus and anterior/posterior balance throughout full range of motion. The patella is everted and thickness measured to be 21  mm. Free hand resection is taken to 12 mm, a 35 template is placed, lug holes are drilled, trial patella is placed, and it  tracks normally. Osteophytes are removed off the posterior femur with the trial in place. All trials are removed and the cut bone surfaces prepared with pulsatile lavage. Cement is mixed and once ready for implantation, the size 3 tibial implant, size  3 posterior stabilized femoral component, and the size 35 patella are cemented in place and the patella is held with the clamp. The trial insert is placed and the knee held in full extension. The Exparel (20 ml mixed with 30 ml saline) and .25% Bupivicaine, are injected into the extensor mechanism, posterior capsule, medial and lateral gutters and subcutaneous tissues.  All extruded cement is removed and once the cement is hard the permanent 10 mm posterior stabilized rotating platform insert is placed into the tibial tray.      The wound is copiously irrigated with saline solution and the extensor mechanism closed over a hemovac drain with #1 V-loc suture. The tourniquet is released for a total tourniquet time of 33  minutes. Flexion against gravity is 140 degrees and the patella tracks normally. Subcutaneous tissue is closed with 2.0 vicryl and subcuticular with running 4.0 Monocryl. The incision is cleaned and dried and steri-strips and a bulky sterile dressing are applied. The limb is placed into a knee immobilizer and the patient is awakened and transported to recovery in stable condition.      Please note that a surgical assistant was a medical necessity for this procedure in order to perform it in a safe and expeditious manner. Surgical assistant was necessary to retract the ligaments and vital neurovascular structures to prevent injury to them and also necessary for proper positioning of the limb to allow for anatomic placement of the prosthesis.   Dione Plover Ricke Kimoto, MD    05/11/2014, 8:14 AM

## 2014-05-11 NOTE — Interval H&P Note (Signed)
History and Physical Interval Note:  05/11/2014 6:40 AM  Colleen Shannon  has presented today for surgery, with the diagnosis of OA RIGHT KNEE   The various methods of treatment have been discussed with the patient and family. After consideration of risks, benefits and other options for treatment, the patient has consented to  Procedure(s): RIGHT TOTAL KNEE ARTHROPLASTY (Right) as a surgical intervention .  The patient's history has been reviewed, patient examined, no change in status, stable for surgery.  I have reviewed the patient's chart and labs.  Questions were answered to the patient's satisfaction.     [name]

## 2014-05-11 NOTE — Transfer of Care (Signed)
Immediate Anesthesia Transfer of Care Note  Patient: Colleen Shannon  Procedure(s) Performed: Procedure(s): RIGHT TOTAL KNEE ARTHROPLASTY (Right)  Patient Location: PACU  Anesthesia Type:Regional and Spinal  Level of Consciousness: awake, alert , sedated and patient cooperative  Airway & Oxygen Therapy: Patient Spontanous Breathing and Patient connected to face mask oxygen  Post-op Assessment: Report given to PACU RN and Post -op Vital signs reviewed and stable  Post vital signs: Reviewed and stable  Complications: No apparent anesthesia complications

## 2014-05-11 NOTE — Anesthesia Postprocedure Evaluation (Signed)
Anesthesia Post Note  Patient: Colleen Shannon  Procedure(s) Performed: Procedure(s) (LRB): RIGHT TOTAL KNEE ARTHROPLASTY (Right)  Anesthesia type: Spinal  Patient location: PACU  Post pain: Pain level controlled  Post assessment: Post-op Vital signs reviewed  Last Vitals: BP 113/67  Pulse 64  Temp(Src) 36.4 C  Resp 14  Ht 5\' 1"  (1.549 m)  Wt 148 lb (67.132 kg)  BMI 27.98 kg/m2  SpO2 100%  LMP 11/19/2011  Post vital signs: Reviewed  Level of consciousness: sedated  Complications: No apparent anesthesia complications

## 2014-05-12 LAB — CBC
HCT: 33.3 % — ABNORMAL LOW (ref 36.0–46.0)
Hemoglobin: 11.4 g/dL — ABNORMAL LOW (ref 12.0–15.0)
MCH: 30.5 pg (ref 26.0–34.0)
MCHC: 34.2 g/dL (ref 30.0–36.0)
MCV: 89 fL (ref 78.0–100.0)
Platelets: 228 10*3/uL (ref 150–400)
RBC: 3.74 MIL/uL — AB (ref 3.87–5.11)
RDW: 12.7 % (ref 11.5–15.5)
WBC: 17.5 10*3/uL — AB (ref 4.0–10.5)

## 2014-05-12 LAB — BASIC METABOLIC PANEL
ANION GAP: 9 (ref 5–15)
BUN: 7 mg/dL (ref 6–23)
CO2: 27 meq/L (ref 19–32)
Calcium: 8.4 mg/dL (ref 8.4–10.5)
Chloride: 105 mEq/L (ref 96–112)
Creatinine, Ser: 0.55 mg/dL (ref 0.50–1.10)
GFR calc Af Amer: >90 mL/min (ref >90)
GFR calc non Af Amer: >90 mL/min (ref >90)
Glucose, Bld: 143 mg/dL — ABNORMAL HIGH (ref 70–99)
POTASSIUM: 3.6 meq/L — AB (ref 3.7–5.3)
SODIUM: 141 meq/L (ref 137–147)

## 2014-05-12 NOTE — Evaluation (Signed)
Occupational Therapy Evaluation Patient Details Name: Colleen Shannon MRN: [id number] DOB: [birth year] Today's Date: 05/12/2014    History of Present Illness 58 yo female s/p R TKA 05/11/14   Clinical Impression   Pt was admitted for the above surgery.  All education was completed.  Pt does not need any further OT at this time.    Follow Up Recommendations  No OT follow up    Equipment Recommendations  None recommended by OT :  Pt has standard commodes (has a high sink next to it and may not be helpful).  She wants to try this; if she has difficulty, she will ask HHPT to help her get riser or 3:1   Recommendations for Other Services       Precautions / Restrictions Precautions Required Braces or Orthoses: Knee Immobilizer - Right Knee Immobilizer - Right: Discontinue once straight leg raise with < 10 degree lag Restrictions Weight Bearing Restrictions: No RLE Weight Bearing: Weight bearing as tolerated      Mobility Bed Mobility         Supine to sit: Min assist     General bed mobility comments: Assist for R LE   Transfers     Transfers: Sit to/from Stand Sit to Stand: Min guard;Min assist         General transfer comment: min guard from bed with cues for UE/LE placement.  Min A from comfort height toilet    Balance                                            ADL Overall ADL's : Needs assistance/impaired     Grooming: Wash/dry hands;Supervision/safety;Standing                   Toilet Transfer: Minimal assistance;Comfort height toilet   Toileting- Clothing Manipulation and Hygiene: Min guard;Sit to/from stand         General ADL Comments: Pt can perform UB adls with set up and min A for LB adls.  She wants to try her standard commode at home and will let HHPT know if she needs 3:1 or riser.  Husband is available to assist her. Pt will sponge bathe initially at home.  Educated on this vs. Tub bench--pt has glass doors in tub and  plans to sponge bathe until she can step in.     Vision                     Perception     Praxis      Pertinent Vitals/Pain 5/10 RLE when standing/ambulating     Hand Dominance     Extremity/Trunk Assessment Upper Extremity Assessment Upper Extremity Assessment: Overall WFL for tasks assessed           Communication Communication Communication: No difficulties   Cognition Arousal/Alertness: Awake/alert Behavior During Therapy: WFL for tasks assessed/performed Overall Cognitive Status: Within Functional Limits for tasks assessed                     General Comments       Exercises       Shoulder Instructions      Home Living Family/patient expects to be discharged to:: Private residence             Home Layout: Two level;Able to live on main level with bedroom/bathroom  Bathroom Shower/Tub: Risk analyst characteristics: Charity fundraiser: Standard     Home Equipment: Cane - single point;Crutches;Walker - 4 wheels   Additional Comments: wants to try her standard commode at home:  practiced with our comfort height without surface to push up from.  Pt is 5\' 1"       Prior Functioning/Environment Level of Independence: Independent             OT Diagnosis:     OT Problem List:     OT Treatment/Interventions:      OT Goals(Current goals can be found in the care plan section)    OT Frequency:     Barriers to D/C:            Co-evaluation              End of Session    Activity Tolerance: Patient tolerated treatment well Patient left: in chair;with call bell/phone within reach   Time: 3267-1245 OT Time Calculation (min): 31 min Charges:  OT General Charges $OT Visit: 1 Procedure OT Evaluation $Initial OT Evaluation Tier I: 1 Procedure OT Treatments $Self Care/Home Management : 8-22 mins G-Codes:    Michaeljames Milnes 01-Jun-2014, 8:49 AM  Lesle Chris, OTR/L [phone] 01-Jun-2014

## 2014-05-12 NOTE — Progress Notes (Signed)
Physical Therapy Treatment Patient Details Name: ARNOLD DEPINTO MRN: [id number] DOB: [birth year] Today's Date: 05/12/2014    History of Present Illness 58 yo female s/p R TKA 05/11/14    PT Comments    Progressing well with mobility. Plan is for d/c home on tomorrow. Need to practice steps.  Follow Up Recommendations  Home health PT     Equipment Recommendations  None recommended by PT    Recommendations for Other Services OT consult     Precautions / Restrictions Precautions Precautions: Knee Required Braces or Orthoses: Knee Immobilizer - Right Knee Immobilizer - Right: Discontinue once straight leg raise with < 10 degree lag Restrictions Weight Bearing Restrictions: No RLE Weight Bearing: Weight bearing as tolerated    Mobility  Bed Mobility   Bed Mobility: Supine to Sit;Sit to Supine     Supine to sit: Min assist Sit to supine: Min assist   General bed mobility comments: assist for R LE.   Transfers Overall transfer level: Needs assistance Equipment used: Rolling walker (2 wheeled) Transfers: Sit to/from Stand Sit to Stand: Min guard         General transfer comment: close guard for safety. VCs safety, technique,hand placement  Ambulation/Gait Ambulation/Gait assistance: Min guard Ambulation Distance (Feet): 150 Feet Assistive device: Rolling walker (2 wheeled) Gait Pattern/deviations: Step-to pattern;Decreased stride length;Antalgic     General Gait Details:  VCS safety, technique, sequence. close guard for safety   Stairs            Wheelchair Mobility    Modified Rankin (Stroke Patients Only)       Balance                                    Cognition Arousal/Alertness: Awake/alert Behavior During Therapy: WFL for tasks assessed/performed Overall Cognitive Status: Within Functional Limits for tasks assessed                      Exercises      General Comments        Pertinent Vitals/Pain 4/10 R knee  with activity. Ice applied end of session    Home Living                      Prior Function            PT Goals (current goals can now be found in the care plan section) Progress towards PT goals: Progressing toward goals    Frequency  7X/week    PT Plan Current plan remains appropriate    Co-evaluation             End of Session Equipment Utilized During Treatment: Gait belt Activity Tolerance: Patient tolerated treatment well Patient left: in bed;with call bell/phone within reach;with family/visitor present     Time: 6387-5643 PT Time Calculation (min): 21 min  Charges:  $Gait Training: 8-22 mins                    G Codes:      Weston Anna, MPT Pager: [phone]

## 2014-05-12 NOTE — Progress Notes (Signed)
   Subjective: 1 Day Post-Op Procedure(s) (LRB): RIGHT TOTAL KNEE ARTHROPLASTY (Right) Patient reports pain as mild.   Patient seen in rounds with Dr. Wynelle Link. Has some N/V yesterday.  Monitor today. If continues, will consider changing mediations. Patient is well, but has had some minor complaints of pain in the knee, requiring pain medications Got up and walked about 4 feet yesterday with therapy and will get up again today.  Plan is to go Home after hospital stay.  Objective: Vital signs in last 24 hours: Temp:  [97.5 F (36.4 C)-98.6 F (37 C)] 98.4 F (36.9 C) (08/04 0443) Pulse Rate:  [60-75] 60 (08/04 0443) Resp:  [8-20] 14 (08/04 0443) BP: (96-137)/(51-91) 121/59 mmHg (08/04 0443) SpO2:  [92 %-100 %] 94 % (08/04 0443) Weight:  [67.132 kg (148 lb)] 67.132 kg (148 lb) (08/03 1050)  Intake/Output from previous day:  Intake/Output Summary (Last 24 hours) at 05/12/14 0859 Last data filed at 05/12/14 0849  Gross per 24 hour  Intake 3191.25 ml  Output   6670 ml  Net -3478.75 ml    Intake/Output this shift: Total I/O In: 240 [P.O.:240] Out: 250 [Urine:250]  Labs:  Recent Labs  05/12/14 0415  HGB 11.4*    Recent Labs  05/12/14 0415  WBC 17.5*  RBC 3.74*  HCT 33.3*  PLT 228    Recent Labs  05/12/14 0415  NA 141  K 3.6*  CL 105  CO2 27  BUN 7  CREATININE 0.55  GLUCOSE 143*  CALCIUM 8.4   No results found for this basename: LABPT, INR,  in the last 72 hours  EXAM General - Patient is Alert, Appropriate and Oriented Extremity - Neurovascular intact Sensation intact distally Dorsiflexion/Plantar flexion intact Dressing - dressing C/D/I Motor Function - intact, moving foot and toes well on exam.  Hemovac pulled without difficulty.  Past Medical History  Diagnosis Date  . Endometrial polyp   . Hypertension 05/22/12    ECHO-EF >55% trace tricupsid regurgitation. There is mild mitral regurgitation.   . Elevated cholesterol 05/22/12    Nuc stress  test-Normal. The patiet had a markedly positive GXT for ischemia. post stress EF 72%  . Arthritis   . Cancer     hx of skin cancer     Assessment/Plan: 1 Day Post-Op Procedure(s) (LRB): RIGHT TOTAL KNEE ARTHROPLASTY (Right) Principal Problem:   OA (osteoarthritis) of knee  Estimated body mass index is 27.98 kg/(m^2) as calculated from the following:   Height as of this encounter: 5\' 1"  (1.549 m).   Weight as of this encounter: 67.132 kg (148 lb). Advance diet Up with therapy Plan for discharge tomorrow Discharge home with home health Monitor for further nausea.  DVT Prophylaxis - Xarelto Weight-Bearing as tolerated to rihgt leg D/C O2 and Pulse OX and try on Room Air  Colleen Muslim, PA-C Orthopaedic Surgery 05/12/2014, 8:59 AM

## 2014-05-12 NOTE — Discharge Instructions (Addendum)
° °Dr. Frank Aluisio °Total Joint Specialist °[facility] °[address] °[place], [place] [address] °[phone] ° °TOTAL KNEE REPLACEMENT POSTOPERATIVE DIRECTIONS ° ° ° °Knee Rehabilitation, Guidelines Following Surgery  °Results after knee surgery are often greatly improved when you follow the exercise, range of motion and muscle strengthening exercises prescribed by your doctor. Safety measures are also important to protect the knee from further injury. Any time any of these exercises cause you to have increased pain or swelling in your knee joint, decrease the amount until you are comfortable again and slowly increase them. If you have problems or questions, call your caregiver or physical therapist for advice.  ° °HOME CARE INSTRUCTIONS  °Remove items at home which could result in a fall. This includes throw rugs or furniture in walking pathways.  °Continue medications as instructed at time of discharge. °You may have some home medications which will be placed on hold until you complete the course of blood thinner medication.  °You may start showering once you are discharged home but do not submerge the incision under water. Just pat the incision dry and apply a dry gauze dressing on daily. °Walk with walker as instructed.  °You may resume a sexual relationship in one month or when given the OK by  your doctor.  °· Use walker as long as suggested by your caregivers. °· Avoid periods of inactivity such as sitting longer than an hour when not asleep. This helps prevent blood clots.  °You may put full weight on your legs and walk as much as is comfortable.  °You may return to work once you are cleared by your doctor.  °Do not drive a car for 6 weeks or until released by you surgeon.  °· Do not drive while taking narcotics.  °Wear the elastic stockings for three weeks following surgery during the day but you may remove then at night. °Make sure you keep all of your appointments after your  operation with all of your doctors and caregivers. You should call the office at the above phone number and make an appointment for approximately two weeks after the date of your surgery. °Change the dressing daily and reapply a dry dressing each time. °Please pick up a stool softener and laxative for home use as long as you are requiring pain medications. °· Continue to use ice on the knee for pain and swelling from surgery. You may notice swelling that will progress down to the foot and ankle.  This is normal after surgery.  Elevate the leg when you are not up walking on it.   °It is important for you to complete the blood thinner medication as prescribed by your doctor. °· Continue to use the breathing machine which will help keep your temperature down.  It is common for your temperature to cycle up and down following surgery, especially at night when you are not up moving around and exerting yourself.  The breathing machine keeps your lungs expanded and your temperature down. ° °RANGE OF MOTION AND STRENGTHENING EXERCISES  °Rehabilitation of the knee is important following a knee injury or an operation. After just a few days of immobilization, the muscles of the thigh which control the knee become weakened and shrink (atrophy). Knee exercises are designed to build up the tone and strength of the thigh muscles and to improve knee motion. Often times heat used for twenty to thirty minutes before working out will loosen up your tissues and help with improving the   range of motion but do not use heat for the first two weeks following surgery. These exercises can be done on a training (exercise) mat, on the floor, on a table or on a bed. Use what ever works the best and is most comfortable for you Knee exercises include:  Leg Lifts - While your knee is still immobilized in a splint or cast, you can do straight leg raises. Lift the leg to 60 degrees, hold for 3 sec, and slowly lower the leg. Repeat 10-20 times 2-3  times daily. Perform this exercise against resistance later as your knee gets better.  Quad and Hamstring Sets - Tighten up the muscle on the front of the thigh (Quad) and hold for 5-10 sec. Repeat this 10-20 times hourly. Hamstring sets are done by pushing the foot backward against an object and holding for 5-10 sec. Repeat as with quad sets.  A rehabilitation program following serious knee injuries can speed recovery and prevent re-injury in the future due to weakened muscles. Contact your doctor or a physical therapist for more information on knee rehabilitation.   SKILLED REHAB INSTRUCTIONS: If the patient is transferred to a skilled rehab facility following release from the hospital, a list of the current medications will be sent to the facility for the patient to continue.  When discharged from the skilled rehab facility, please have the facility set up the patient's [place] prior to being released. Also, the skilled facility will be responsible for providing the patient with their medications at time of release from the facility to include their pain medication, the muscle relaxants, and their blood thinner medication. If the patient is still at the rehab facility at time of the two week follow up appointment, the skilled rehab facility will also need to assist the patient in arranging follow up appointment in our office and any transportation needs.  MAKE SURE YOU:  Understand these instructions.  Will watch your condition.  Will get help right away if you are not doing well or get worse.    Pick up stool softner and laxative for home. Do not submerge incision under water. May shower. Continue to use ice for pain and swelling from surgery.  Take Xarelto for two and a half more weeks, then discontinue Xarelto. Once the patient has completed the blood thinner regimen, then take a Baby 81 mg Aspirin daily for three more weeks.  Information on my medicine - XARELTO  (Rivaroxaban)  This medication education was reviewed with me or my healthcare representative as part of my discharge preparation.  The pharmacist that spoke with me during my hospital stay was:  Clovis Riley, [facility]  Why was Xarelto prescribed for you? Xarelto was prescribed for you to reduce the risk of blood clots forming after orthopedic surgery. The medical term for these abnormal blood clots is venous thromboembolism (VTE).  What do you need to know about xarelto ? Take your Xarelto ONCE DAILY at the same time every day. You may take it either with or without food.  If you have difficulty swallowing the tablet whole, you may crush it and mix in applesauce just prior to taking your dose.  Take Xarelto exactly as prescribed by your doctor and DO NOT stop taking Xarelto without talking to the doctor who prescribed the medication.  Stopping without other VTE prevention medication to take the place of Xarelto may increase your risk of developing a clot.  After discharge, you should have regular check-up appointments  with your healthcare provider that is prescribing your Xarelto.    What do you do if you miss a dose? If you miss a dose, take it as soon as you remember on the same day then continue your regularly scheduled once daily regimen the next day. Do not take two doses of Xarelto on the same day.   Important Safety Information A possible side effect of Xarelto is bleeding. You should call your healthcare provider right away if you experience any of the following:   Bleeding from an injury or your nose that does not stop.   Unusual colored urine (red or dark brown) or unusual colored stools (red or black).   Unusual bruising for unknown reasons.   A serious fall or if you hit your head (even if there is no bleeding).  Some medicines may interact with Xarelto and might increase your risk of bleeding while on Xarelto. To help avoid this, consult your healthcare  provider or pharmacist prior to using any new prescription or non-prescription medications, including herbals, vitamins, non-steroidal anti-inflammatory drugs (NSAIDs) and supplements.  This website has more information on Xarelto: [email or web address]

## 2014-05-12 NOTE — Progress Notes (Signed)
[facility]  [facility] is providing the following services: commode  If patient discharges after hours, please call [phone].   Colleen Shannon 05/12/2014, 9:45 AM

## 2014-05-12 NOTE — Progress Notes (Signed)
Physical Therapy Treatment Patient Details Name: Colleen Shannon MRN: [id number] DOB: 04-30-1956 Today's Date: 05/12/2014    History of Present Illness 58 yo female s/p R TKA 05/11/14    PT Comments    Progressing well with mobility. Slow progress with ROM  Follow Up Recommendations  Home health PT     Equipment Recommendations  None recommended by PT (pt borrowed walker)    Recommendations for Other Services OT consult     Precautions / Restrictions Precautions Precautions: Knee Required Braces or Orthoses: Knee Immobilizer - Right Knee Immobilizer - Right: Discontinue once straight leg raise with < 10 degree lag Restrictions Weight Bearing Restrictions: No RLE Weight Bearing: Weight bearing as tolerated    Mobility  Bed Mobility Overal bed mobility: Needs Assistance Bed Mobility: Sit to Supine     Supine to sit: Min assist Sit to supine: Min assist   General bed mobility comments: Assist for R LE   Transfers Overall transfer level: Needs assistance Equipment used: Rolling walker (2 wheeled) Transfers: Sit to/from Stand Sit to Stand: Min guard         General transfer comment: close guard for safety. VCs safety, technique,hand placement  Ambulation/Gait Ambulation/Gait assistance: Min guard Ambulation Distance (Feet): 100 Feet Assistive device: Rolling walker (2 wheeled) Gait Pattern/deviations: Step-to pattern;Decreased stride length;Antalgic     General Gait Details: slow gait speed. VCS safety, technique, sequence. close guard for safety   Stairs            Wheelchair Mobility    Modified Rankin (Stroke Patients Only)       Balance                                    Cognition Arousal/Alertness: Awake/alert Behavior During Therapy: WFL for tasks assessed/performed Overall Cognitive Status: Within Functional Limits for tasks assessed                      Exercises Total Joint Exercises Ankle Circles/Pumps:  AROM;10 reps;Supine;Both Quad Sets: AROM;Both;10 reps;Supine Heel Slides: AAROM;Right;10 reps;Supine Hip ABduction/ADduction: AROM;Right;10 reps;Supine Straight Leg Raises: Right;10 reps;Supine Goniometric ROM: 10-40 degrees    General Comments        Pertinent Vitals/Pain R knee 5/10. Ice applied end of session    Home Living Family/patient expects to be discharged to:: Private residence           Home Layout: Two level;Able to live on main level with bedroom/bathroom Home Equipment: Cane - single point;Crutches;Walker - 4 wheels Additional Comments: wants to try her standard commode at home:  practiced with our comfort height without surface to push up from.  Pt is 5\' 1"     Prior Function Level of Independence: Independent          PT Goals (current goals can now be found in the care plan section) Progress towards PT goals: Progressing toward goals    Frequency  7X/week    PT Plan Current plan remains appropriate    Co-evaluation             End of Session Equipment Utilized During Treatment: Gait belt Activity Tolerance: Patient tolerated treatment well Patient left: in bed;with call bell/phone within reach     Time: 0940-1009 PT Time Calculation (min): 29 min  Charges:  $Gait Training: 8-22 mins $Therapeutic Exercise: 8-22 mins  G Codes:      Weston Anna, MPT Pager: [phone]

## 2014-05-13 LAB — BASIC METABOLIC PANEL
Anion gap: 10 (ref 5–15)
BUN: 8 mg/dL (ref 6–23)
CHLORIDE: 105 meq/L (ref 96–112)
CO2: 27 mEq/L (ref 19–32)
Calcium: 9.1 mg/dL (ref 8.4–10.5)
Creatinine, Ser: 0.51 mg/dL (ref 0.50–1.10)
GFR calc Af Amer: >90 mL/min (ref >90)
GFR calc non Af Amer: >90 mL/min (ref >90)
Glucose, Bld: 150 mg/dL — ABNORMAL HIGH (ref 70–99)
POTASSIUM: 3.9 meq/L (ref 3.7–5.3)
Sodium: 142 mEq/L (ref 137–147)

## 2014-05-13 LAB — CBC
HEMATOCRIT: 33.3 % — AB (ref 36.0–46.0)
Hemoglobin: 10.8 g/dL — ABNORMAL LOW (ref 12.0–15.0)
MCH: 29.3 pg (ref 26.0–34.0)
MCHC: 32.4 g/dL (ref 30.0–36.0)
MCV: 90.5 fL (ref 78.0–100.0)
Platelets: 233 10*3/uL (ref 150–400)
RBC: 3.68 MIL/uL — ABNORMAL LOW (ref 3.87–5.11)
RDW: 12.8 % (ref 11.5–15.5)
WBC: 17.7 10*3/uL — AB (ref 4.0–10.5)

## 2014-05-13 MED ORDER — METHOCARBAMOL 500 MG PO TABS: 500.0000 mg | ORAL_TABLET | Freq: Four times a day (QID) | ORAL | Status: DC | PRN

## 2014-05-13 MED ORDER — RIVAROXABAN 10 MG PO TABS: 10.0000 mg | ORAL_TABLET | Freq: Every day | ORAL | Status: DC

## 2014-05-13 MED ORDER — TRAMADOL HCL 50 MG PO TABS: 50.0000 mg | ORAL_TABLET | Freq: Four times a day (QID) | ORAL | Status: DC | PRN

## 2014-05-13 MED ORDER — OXYCODONE HCL 5 MG PO TABS: 5.0000 mg | ORAL_TABLET | ORAL | Status: DC | PRN

## 2014-05-13 NOTE — Discharge Summary (Signed)
Physician Discharge Summary   Patient ID: Colleen Shannon MRN: [id number] DOB/AGE: 1956/05/28 58 y.o.  Admit date: 05/11/2014 Discharge date: 05/13/2014  Primary Diagnosis:  Osteoarthritis Right knee(s)  Admission Diagnoses:  Past Medical History  Diagnosis Date  . Endometrial polyp   . Hypertension 05/22/12    ECHO-EF >55% trace tricupsid regurgitation. There is mild mitral regurgitation.   . Elevated cholesterol 05/22/12    Nuc stress test-Normal. The patiet had a markedly positive GXT for ischemia. post stress EF 72%  . Arthritis   . Cancer     hx of skin cancer    Discharge Diagnoses:   Principal Problem:   OA (osteoarthritis) of knee  Estimated body mass index is 27.98 kg/(m^2) as calculated from the following:   Height as of this encounter: 5' 1"  (1.549 m).   Weight as of this encounter: 67.132 kg (148 lb).  Procedure:  Procedure(s) (LRB): RIGHT TOTAL KNEE ARTHROPLASTY (Right)   Consults: None  HPI: Colleen Shannon is a 58 y.o. year old female with end stage OA of her right knee with progressively worsening pain and dysfunction. She has constant pain, with activity and at rest and significant functional deficits with difficulties even with ADLs. She has had extensive non-op management including analgesics, injections of cortisone and viscosupplements, and home exercise program, but remains in significant pain with significant dysfunction.Radiographs show bone on bone arthritis medial and patellofemoral. She presents now for right Total Knee Arthroplasty.   Laboratory Data: Admission on 05/11/2014  Component Date Value Ref Range Status  . ABO/RH(D) 05/11/2014 A POS   Final  . Antibody Screen 05/11/2014 NEG   Final  . Sample Expiration 05/11/2014 05/14/2014   Final  . ABO/RH(D) 05/11/2014 A POS   Final  . WBC 05/12/2014 17.5* 4.0 - 10.5 K/uL Final  . RBC 05/12/2014 3.74* 3.87 - 5.11 MIL/uL Final  . Hemoglobin 05/12/2014 11.4* 12.0 - 15.0 g/dL Final  . HCT 05/12/2014  33.3* 36.0 - 46.0 % Final  . MCV 05/12/2014 89.0  78.0 - 100.0 fL Final  . MCH 05/12/2014 30.5  26.0 - 34.0 pg Final  . MCHC 05/12/2014 34.2  30.0 - 36.0 g/dL Final  . RDW 05/12/2014 12.7  11.5 - 15.5 % Final  . Platelets 05/12/2014 228  150 - 400 K/uL Final  . Sodium 05/12/2014 141  137 - 147 mEq/L Final  . Potassium 05/12/2014 3.6* 3.7 - 5.3 mEq/L Final  . Chloride 05/12/2014 105  96 - 112 mEq/L Final  . CO2 05/12/2014 27  19 - 32 mEq/L Final  . Glucose, Bld 05/12/2014 143* 70 - 99 mg/dL Final  . BUN 05/12/2014 7  6 - 23 mg/dL Final  . Creatinine, Ser 05/12/2014 0.55  0.50 - 1.10 mg/dL Final  . Calcium 05/12/2014 8.4  8.4 - 10.5 mg/dL Final  . GFR calc non Af Amer 05/12/2014 >90  >90 mL/min Final  . GFR calc Af Amer 05/12/2014 >90  >90 mL/min Final   Comment: (NOTE)                          The eGFR has been calculated using the CKD EPI equation.                          This calculation has not been validated in all clinical situations.  eGFR's persistently <90 mL/min signify possible Chronic Kidney                          Disease.  . Anion gap 05/12/2014 9  5 - 15 Final  . WBC 05/13/2014 17.7* 4.0 - 10.5 K/uL Final  . RBC 05/13/2014 3.68* 3.87 - 5.11 MIL/uL Final  . Hemoglobin 05/13/2014 10.8* 12.0 - 15.0 g/dL Final  . HCT 05/13/2014 33.3* 36.0 - 46.0 % Final  . MCV 05/13/2014 90.5  78.0 - 100.0 fL Final  . MCH 05/13/2014 29.3  26.0 - 34.0 pg Final  . MCHC 05/13/2014 32.4  30.0 - 36.0 g/dL Final  . RDW 05/13/2014 12.8  11.5 - 15.5 % Final  . Platelets 05/13/2014 233  150 - 400 K/uL Final  . Sodium 05/13/2014 142  137 - 147 mEq/L Final  . Potassium 05/13/2014 3.9  3.7 - 5.3 mEq/L Final  . Chloride 05/13/2014 105  96 - 112 mEq/L Final  . CO2 05/13/2014 27  19 - 32 mEq/L Final  . Glucose, Bld 05/13/2014 150* 70 - 99 mg/dL Final  . BUN 05/13/2014 8  6 - 23 mg/dL Final  . Creatinine, Ser 05/13/2014 0.51  0.50 - 1.10 mg/dL Final  . Calcium 05/13/2014 9.1   8.4 - 10.5 mg/dL Final  . GFR calc non Af Amer 05/13/2014 >90  >90 mL/min Final  . GFR calc Af Amer 05/13/2014 >90  >90 mL/min Final   Comment: (NOTE)                          The eGFR has been calculated using the CKD EPI equation.                          This calculation has not been validated in all clinical situations.                          eGFR's persistently <90 mL/min signify possible Chronic Kidney                          Disease.  Georgiann Hahn gap 05/13/2014 10  5 - 15 Final  Hospital Outpatient Visit on 04/30/2014  Component Date Value Ref Range Status  . aPTT 04/30/2014 28  24 - 37 seconds Final  . WBC 04/30/2014 14.8* 4.0 - 10.5 K/uL Final  . RBC 04/30/2014 4.59  3.87 - 5.11 MIL/uL Final  . Hemoglobin 04/30/2014 13.6  12.0 - 15.0 g/dL Final  . HCT 04/30/2014 40.9  36.0 - 46.0 % Final  . MCV 04/30/2014 89.1  78.0 - 100.0 fL Final  . MCH 04/30/2014 29.6  26.0 - 34.0 pg Final  . MCHC 04/30/2014 33.3  30.0 - 36.0 g/dL Final  . RDW 04/30/2014 12.7  11.5 - 15.5 % Final  . Platelets 04/30/2014 306  150 - 400 K/uL Final  . Sodium 04/30/2014 140  137 - 147 mEq/L Final  . Potassium 04/30/2014 4.9  3.7 - 5.3 mEq/L Final  . Chloride 04/30/2014 101  96 - 112 mEq/L Final  . CO2 04/30/2014 28  19 - 32 mEq/L Final  . Glucose, Bld 04/30/2014 93  70 - 99 mg/dL Final  . BUN 04/30/2014 13  6 - 23 mg/dL Final  . Creatinine, Ser 04/30/2014 0.61  0.50 - 1.10 mg/dL Final  .  Calcium 04/30/2014 9.5  8.4 - 10.5 mg/dL Final  . Total Protein 04/30/2014 7.1  6.0 - 8.3 g/dL Final  . Albumin 04/30/2014 4.2  3.5 - 5.2 g/dL Final  . AST 04/30/2014 37  0 - 37 U/L Final   Comment: SLIGHT HEMOLYSIS                          HEMOLYSIS AT THIS LEVEL MAY AFFECT RESULT  . ALT 04/30/2014 29  0 - 35 U/L Final  . Alkaline Phosphatase 04/30/2014 54  39 - 117 U/L Final  . Total Bilirubin 04/30/2014 0.5  0.3 - 1.2 mg/dL Final  . GFR calc non Af Amer 04/30/2014 >90  >90 mL/min Final  . GFR calc Af Amer 04/30/2014  >90  >90 mL/min Final   Comment: (NOTE)                          The eGFR has been calculated using the CKD EPI equation.                          This calculation has not been validated in all clinical situations.                          eGFR's persistently <90 mL/min signify possible Chronic Kidney                          Disease.  . Anion gap 04/30/2014 11  5 - 15 Final  . Prothrombin Time 04/30/2014 12.4  11.6 - 15.2 seconds Final  . INR 04/30/2014 0.92  0.00 - 1.49 Final  . Color, Urine 04/30/2014 YELLOW  YELLOW Final  . APPearance 04/30/2014 CLEAR  CLEAR Final  . Specific Gravity, Urine 04/30/2014 1.008  1.005 - 1.030 Final  . pH 04/30/2014 5.5  5.0 - 8.0 Final  . Glucose, UA 04/30/2014 NEGATIVE  NEGATIVE mg/dL Final  . Hgb urine dipstick 04/30/2014 NEGATIVE  NEGATIVE Final  . Bilirubin Urine 04/30/2014 NEGATIVE  NEGATIVE Final  . Ketones, ur 04/30/2014 NEGATIVE  NEGATIVE mg/dL Final  . Protein, ur 04/30/2014 NEGATIVE  NEGATIVE mg/dL Final  . Urobilinogen, UA 04/30/2014 0.2  0.0 - 1.0 mg/dL Final  . Nitrite 04/30/2014 NEGATIVE  NEGATIVE Final  . Leukocytes, UA 04/30/2014 NEGATIVE  NEGATIVE Final   MICROSCOPIC NOT DONE ON URINES WITH NEGATIVE PROTEIN, BLOOD, LEUKOCYTES, NITRITE, OR GLUCOSE <1000 mg/dL.  Marland Kitchen MRSA, PCR 04/30/2014 NEGATIVE  NEGATIVE Final  . Staphylococcus aureus 04/30/2014 NEGATIVE  NEGATIVE Final   Comment:                                 The Xpert SA Assay (FDA                          approved for NASAL specimens                          in patients over 22 years of age),                          is one component of  a comprehensive surveillance                          program.  Test performance has                          been validated by [facility] for patients greater                          than or equal to 20 year old.                          It is not intended                          to diagnose  infection nor to                          guide or monitor treatment.     X-Rays:Dg Chest 2 View  04/30/2014   CLINICAL DATA:  Preoperative evaluation prior to right total knee arthroplasty. Current history of hypertension.  EXAM: CHEST  2 VIEW  COMPARISON:  Two-view chest x-ray 10/28/2012. Portable chest x-ray 10/31/2004.  FINDINGS: Cardiomediastinal silhouette unremarkable and unchanged. Lungs clear. Bronchovascular markings normal. Pulmonary vascularity normal. No visible pleural effusions. No pneumothorax. Mild degenerative changes involving the thoracic spine. No significant interval change.  IMPRESSION: No acute cardiopulmonary disease.  Stable examination.   Electronically Signed   By: Evangeline Dakin M.D.   On: 04/30/2014 11:42    EKG: Orders placed in visit on 06/05/13  . EKG 12-LEAD     Hospital Course: Colleen Shannon is a 58 y.o. who was admitted to [facility]. They were brought to the operating room on 05/11/2014 and underwent Procedure(s): RIGHT TOTAL KNEE ARTHROPLASTY.  Patient tolerated the procedure well and was later transferred to the recovery room and then to the orthopaedic floor for postoperative care.  They were given PO and IV analgesics for pain control following their surgery.  They were given 24 hours of postoperative antibiotics of  Anti-infectives   Start     Dose/Rate Route Frequency Ordered Stop   05/11/14 1300  ceFAZolin (ANCEF) IVPB 1 g/50 mL premix     1 g 100 mL/hr over 30 Minutes Intravenous Every 6 hours 05/11/14 1052 05/11/14 1830   05/11/14 0530  ceFAZolin (ANCEF) IVPB 2 g/50 mL premix     2 g 100 mL/hr over 30 Minutes Intravenous On call to O.R. 05/11/14 0530 05/11/14 0711     and started on DVT prophylaxis in the form of Xarelto.   PT and OT were ordered for total joint protocol.  Discharge planning consulted to help with postop disposition and equipment needs.  Patient had a tough night on the evening of surgery with some nausea and vomiting.   They started to get up OOB with therapy and the evening of surgery and on day one. Hemovac drain was pulled without difficulty.  Continued to work with therapy into day two.  Dressing was changed on day two and the incision was healing well.   Patient was seen in rounds, pain was improved and was ready  to go home.   Discharge home with home health  Diet - Cardiac diet  Follow up - in 2 weeks on Tuesday the 18th  Activity - WBAT  Disposition - Home  Condition Upon Discharge - Good  D/C Meds - See DC Summary  DVT Prophylaxis - Xarelto       Discharge Instructions   Call MD / Call 911    Complete by:  As directed   If you experience chest pain or shortness of breath, CALL 911 and be transported to the hospital emergency room.  If you develope a fever above 101 F, pus (white drainage) or increased drainage or redness at the wound, or calf pain, call your surgeon's office.     Change dressing    Complete by:  As directed   Change dressing daily with sterile 4 x 4 inch gauze dressing and apply TED hose. Do not submerge the incision under water.     Constipation Prevention    Complete by:  As directed   Drink plenty of fluids.  Prune juice may be helpful.  You may use a stool softener, such as Colace (over the counter) 100 mg twice a day.  Use MiraLax (over the counter) for constipation as needed.     Diet - low sodium heart healthy    Complete by:  As directed      Discharge instructions    Complete by:  As directed   Pick up stool softner and laxative for home. Do not submerge incision under water. May shower. Continue to use ice for pain and swelling from surgery.  Take Xarelto for two and a half more weeks, then discontinue Xarelto. Once the patient has completed the blood thinner regimen, then take a Baby 81 mg Aspirin daily for three more weeks.     Do not put a pillow under the knee. Place it under the heel.    Complete by:  As directed      Do not sit on low chairs, stoools or  toilet seats, as it may be difficult to get up from low surfaces    Complete by:  As directed      Driving restrictions    Complete by:  As directed   No driving until released by the physician.     Increase activity slowly as tolerated    Complete by:  As directed      Lifting restrictions    Complete by:  As directed   No lifting until released by the physician.     Patient may shower    Complete by:  As directed   You may shower without a dressing once there is no drainage.  Do not wash over the wound.  If drainage remains, do not shower until drainage stops.     TED hose    Complete by:  As directed   Use stockings (TED hose) for 3 weeks on both leg(s).  You may remove them at night for sleeping.     Weight bearing as tolerated    Complete by:  As directed             Medication List    STOP taking these medications       amoxicillin-clavulanate 875-125 MG per tablet  Commonly known as:  AUGMENTIN     CALCIUM 600 + D PO     cholecalciferol 1000 UNITS tablet  Commonly known as:  VITAMIN D     estradiol 1 MG  tablet  Commonly known as:  ESTRACE     progesterone 200 MG capsule  Commonly known as:  PROMETRIUM      TAKE these medications       amLODipine 5 MG tablet  Commonly known as:  NORVASC  Take 5 mg by mouth at bedtime.     atorvastatin 40 MG tablet  Commonly known as:  LIPITOR  Take 40 mg by mouth daily.     carvedilol 6.25 MG tablet  Commonly known as:  COREG  Take 6.25 mg by mouth 2 (two) times daily with a meal.     methocarbamol 500 MG tablet  Commonly known as:  ROBAXIN  Take 1 tablet (500 mg total) by mouth every 6 (six) hours as needed for muscle spasms.     oxyCODONE 5 MG immediate release tablet  Commonly known as:  Oxy IR/ROXICODONE  Take 1-2 tablets (5-10 mg total) by mouth every 3 (three) hours as needed for moderate pain, severe pain or breakthrough pain.     rivaroxaban 10 MG Tabs tablet  Commonly known as:  XARELTO  - Take 1 tablet  (10 mg total) by mouth daily with breakfast. Take Xarelto for two and a half more weeks, then discontinue Xarelto.  - Once the patient has completed the blood thinner regimen, then take a Baby 81 mg Aspirin daily for three more weeks.     traMADol 50 MG tablet  Commonly known as:  ULTRAM  Take 1-2 tablets (50-100 mg total) by mouth every 6 (six) hours as needed (mild to moderate pain).       Follow-up Information   Follow up with [name], MD. Schedule an appointment as soon as possible for a visit on 05/26/2014. (Call office for appointment on Tuesday the 18th.)    Specialty:  Orthopedic Surgery   Contact information:   [address] [place] [address] [phone]       Signed: Arlee Muslim, PA-C Orthopaedic Surgery 05/13/2014, 8:01 AM

## 2014-05-13 NOTE — Progress Notes (Signed)
   Subjective: 2 Days Post-Op Procedure(s) (LRB): RIGHT TOTAL KNEE ARTHROPLASTY (Right) Patient reports pain as mild.   Patient seen in rounds with Dr. Wynelle Link. Pain improved Patient is well, and has had no acute complaints or problems Patient is ready to go home   Objective: Vital signs in last 24 hours: Temp:  [99.1 F (37.3 C)-100.1 F (37.8 C)] 100.1 F (37.8 C) (08/05 0430) Pulse Rate:  [66-100] 100 (08/05 0430) Resp:  [14-16] 14 (08/05 0430) BP: (121-131)/(51-68) 125/68 mmHg (08/05 0430) SpO2:  [91 %-99 %] 91 % (08/05 0430)  Intake/Output from previous day:  Intake/Output Summary (Last 24 hours) at 05/13/14 0733 Last data filed at 05/12/14 1653  Gross per 24 hour  Intake    480 ml  Output   1700 ml  Net  -1220 ml    Intake/Output this shift:    Labs:  Recent Labs  05/12/14 0415 05/13/14 0425  HGB 11.4* 10.8*    Recent Labs  05/12/14 0415 05/13/14 0425  WBC 17.5* 17.7*  RBC 3.74* 3.68*  HCT 33.3* 33.3*  PLT 228 233    Recent Labs  05/12/14 0415 05/13/14 0425  NA 141 142  K 3.6* 3.9  CL 105 105  CO2 27 27  BUN 7 8  CREATININE 0.55 0.51  GLUCOSE 143* 150*  CALCIUM 8.4 9.1   No results found for this basename: LABPT, INR,  in the last 72 hours  EXAM: General - Patient is Alert, Appropriate and Oriented Extremity - Neurovascular intact Sensation intact distally Dorsiflexion/Plantar flexion intact Incision - clean, dry, no drainage, healing Motor Function - intact, moving foot and toes well on exam.   Assessment/Plan: 2 Days Post-Op Procedure(s) (LRB): RIGHT TOTAL KNEE ARTHROPLASTY (Right) Procedure(s) (LRB): RIGHT TOTAL KNEE ARTHROPLASTY (Right) Past Medical History  Diagnosis Date  . Endometrial polyp   . Hypertension 05/22/12    ECHO-EF >55% trace tricupsid regurgitation. There is mild mitral regurgitation.   . Elevated cholesterol 05/22/12    Nuc stress test-Normal. The patiet had a markedly positive GXT for ischemia. post  stress EF 72%  . Arthritis   . Cancer     hx of skin cancer    Principal Problem:   OA (osteoarthritis) of knee  Estimated body mass index is 27.98 kg/(m^2) as calculated from the following:   Height as of this encounter: 5\' 1"  (1.549 m).   Weight as of this encounter: 67.132 kg (148 lb). Up with therapy Discharge home with home health Diet - Cardiac diet Follow up - in 2 weeks on Tuesday the 18th Activity - WBAT Disposition - Home Condition Upon Discharge - Good D/C Meds - See DC Summary DVT Prophylaxis - [place], PA-C Orthopaedic Surgery 05/13/2014, 7:33 AM

## 2014-05-13 NOTE — Progress Notes (Signed)
Physical Therapy Treatment Patient Details Name: Colleen Shannon MRN: [id number] DOB: Sep 25, 1956 Today's Date: 05/13/2014    History of Present Illness 58 yo female s/p R TKA 05/11/14    PT Comments    Continuing to progress well with mobility. Practiced ambulation, stair negotiation, exercises. All education completed. Ready to d/c from PT standpoint.   Follow Up Recommendations  Home health PT     Equipment Recommendations  None recommended by PT    Recommendations for Other Services OT consult     Precautions / Restrictions Precautions Precautions: Knee Required Braces or Orthoses: Knee Immobilizer - Right Knee Immobilizer - Right: Discontinue once straight leg raise with < 10 degree lag Restrictions Weight Bearing Restrictions: No RLE Weight Bearing: Weight bearing as tolerated    Mobility  Bed Mobility Overal bed mobility: Needs Assistance Bed Mobility: Supine to Sit;Sit to Supine     Supine to sit: Min assist Sit to supine: Min assist   General bed mobility comments: assist for R LE.   Transfers Overall transfer level: Needs assistance Equipment used: Rolling walker (2 wheeled) Transfers: Sit to/from Stand Sit to Stand: Min guard         General transfer comment: close guard for safety. VCs safety, technique,hand placement  Ambulation/Gait Ambulation/Gait assistance: Min guard Ambulation Distance (Feet): 115 Feet Assistive device: Rolling walker (2 wheeled) Gait Pattern/deviations: Step-to pattern;Decreased stride length;Antalgic     General Gait Details:  VCS safety, technique, sequence. close guard for safety   Stairs Stairs: Yes Stairs assistance: Min assist Stair Management: Two rails;One rail Right;Step to pattern;Forwards Number of Stairs: 4 (up and over portable steps x2) General stair comments: VCs safety, technique, hand placement. Practiced x1 with 2 rails, then x1 with 1 rail, 1 HHA.   Wheelchair Mobility    Modified Rankin (Stroke  Patients Only)       Balance                                    Cognition Arousal/Alertness: Awake/alert Behavior During Therapy: WFL for tasks assessed/performed Overall Cognitive Status: Within Functional Limits for tasks assessed                      Exercises Total Joint Exercises Ankle Circles/Pumps: AROM;10 reps;Supine;Both Quad Sets: AROM;Both;10 reps;Supine Heel Slides: 5 reps;Supine;AAROM Hip ABduction/ADduction: AROM;Right;10 reps;Supine Straight Leg Raises: Right;10 reps;Supine Knee Flexion: AAROM;Right;5 reps;Seated Goniometric ROM: 10-50 degrees    General Comments        Pertinent Vitals/Pain 5/10 R knee with activity. Ice applied end of session    Home Living                      Prior Function            PT Goals (current goals can now be found in the care plan section) Progress towards PT goals: Progressing toward goals    Frequency  7X/week    PT Plan Current plan remains appropriate    Co-evaluation             End of Session Equipment Utilized During Treatment: Gait belt Activity Tolerance: Patient tolerated treatment well Patient left: in bed;with call bell/phone within reach     Time: 0906-0931 PT Time Calculation (min): 25 min  Charges:  $Gait Training: 8-22 mins $Therapeutic Exercise: 8-22 mins  G Codes:      Weston Anna, MPT Pager: [phone]

## 2014-05-29 ENCOUNTER — Other Ambulatory Visit: Payer: Self-pay | Admitting: *Deleted

## 2014-05-29 MED ORDER — AMLODIPINE BESYLATE 5 MG PO TABS: 5.0000 mg | ORAL_TABLET | Freq: Every day | ORAL | Status: DC

## 2014-05-29 MED ORDER — ATORVASTATIN CALCIUM 40 MG PO TABS: 40.0000 mg | ORAL_TABLET | Freq: Every day | ORAL | Status: DC

## 2014-05-29 NOTE — Telephone Encounter (Signed)
Rx refill sent to patient pharmacy   

## 2014-06-01 MED ORDER — AMLODIPINE BESYLATE 5 MG PO TABS
5.0000 mg | ORAL_TABLET | Freq: Every day | ORAL | Status: DC
Start: 2014-06-01 — End: 2014-07-15

## 2014-06-01 NOTE — Addendum Note (Signed)
Addended by: Fidel Levy on: 06/01/2014 08:53 AM   Modules accepted: Orders

## 2014-06-02 ENCOUNTER — Telehealth: Payer: Self-pay | Admitting: Cardiovascular Disease

## 2014-06-07 ENCOUNTER — Other Ambulatory Visit: Payer: Self-pay | Admitting: Cardiovascular Disease

## 2014-06-08 NOTE — Telephone Encounter (Signed)
Rx was sent to pharmacy electronically. Patient has appmt 10/21

## 2014-06-08 NOTE — Telephone Encounter (Signed)
Closed encounter °

## 2014-06-08 NOTE — Telephone Encounter (Signed)
Rx was sent to pharmacy electronically. 

## 2014-07-02 ENCOUNTER — Ambulatory Visit: Payer: Self-pay | Admitting: Cardiovascular Disease

## 2014-07-15 ENCOUNTER — Encounter (HOSPITAL_COMMUNITY): Payer: Self-pay | Admitting: Pharmacy Technician

## 2014-07-15 NOTE — Progress Notes (Signed)
Please put orders in Epic surgery 07-20-14 pre op 07-17-14 Thanks

## 2014-07-16 ENCOUNTER — Other Ambulatory Visit: Payer: Self-pay | Admitting: Orthopedic Surgery

## 2014-07-16 NOTE — Progress Notes (Signed)
No orders in EPIC.  Surgery on 07/20/2014.  Preop on 07/17/2014 at 0930am.  Thank you.

## 2014-07-16 NOTE — Progress Notes (Signed)
Preoperative surgical orders have been place into the Epic hospital system for Colleen Shannon on 07/16/2014, 6:01 PM  by Mickel Crow for surgery on 07/20/2014.  Preop Knee Manipulation orders including IV Tylenol and IV Decadron as long as there are no contraindications to the above medications. Arlee Muslim, PA-C

## 2014-07-17 ENCOUNTER — Encounter (HOSPITAL_COMMUNITY): Payer: Self-pay

## 2014-07-17 ENCOUNTER — Encounter (HOSPITAL_COMMUNITY)
Admission: RE | Admit: 2014-07-17 | Discharge: 2014-07-17 | Disposition: A | Discharge status: Home / Self Care | Payer: BC Managed Care – PPO | Source: Ambulatory Visit | Attending: Orthopedic Surgery | Admitting: Orthopedic Surgery

## 2014-07-17 DIAGNOSIS — I10 Essential (primary) hypertension: Secondary | ICD-10-CM | POA: Diagnosis not present

## 2014-07-17 DIAGNOSIS — Z85828 Personal history of other malignant neoplasm of skin: Secondary | ICD-10-CM | POA: Diagnosis not present

## 2014-07-17 DIAGNOSIS — E780 Pure hypercholesterolemia: Secondary | ICD-10-CM | POA: Diagnosis not present

## 2014-07-17 DIAGNOSIS — M1990 Unspecified osteoarthritis, unspecified site: Secondary | ICD-10-CM | POA: Diagnosis not present

## 2014-07-17 DIAGNOSIS — M24661 Ankylosis, right knee: Secondary | ICD-10-CM | POA: Diagnosis not present

## 2014-07-17 LAB — CBC
HEMATOCRIT: 40 % (ref 36.0–46.0)
Hemoglobin: 12.9 g/dL (ref 12.0–15.0)
MCH: 28.8 pg (ref 26.0–34.0)
MCHC: 32.3 g/dL (ref 30.0–36.0)
MCV: 89.3 fL (ref 78.0–100.0)
Platelets: 272 10*3/uL (ref 150–400)
RBC: 4.48 MIL/uL (ref 3.87–5.11)
RDW: 12.8 % (ref 11.5–15.5)
WBC: 12.9 10*3/uL — AB (ref 4.0–10.5)

## 2014-07-17 LAB — BASIC METABOLIC PANEL
ANION GAP: 12 (ref 5–15)
BUN: 15 mg/dL (ref 6–23)
CO2: 27 mEq/L (ref 19–32)
CREATININE: 0.64 mg/dL (ref 0.50–1.10)
Calcium: 9.4 mg/dL (ref 8.4–10.5)
Chloride: 102 mEq/L (ref 96–112)
GFR calc Af Amer: >90 mL/min (ref >90)
Glucose, Bld: 101 mg/dL — ABNORMAL HIGH (ref 70–99)
Potassium: 4.9 mEq/L (ref 3.7–5.3)
Sodium: 141 mEq/L (ref 137–147)

## 2014-07-17 NOTE — Progress Notes (Signed)
CXR/Epic 04/30/2014 LOV Dr Claiborne Billings 06/05/2013/epic

## 2014-07-17 NOTE — Patient Instructions (Addendum)
Colleen Shannon  07/17/2014   Your procedure is scheduled on:    07/20/2014  Report to [facility] Main Entrance and follow signs to  [place]  arrive at  13:50 PM.  Call this number if you have problems the morning of surgery [phone] or Presurgical Testing [phone].   Remember:  Do not eat food After Midnight, but may take clear liquids till 10:00 am day of surgery then nothing by mouth.  For Living Will and/or Health Care Power Attorney Forms: please provide copy for your medical record, may bring AM of surgery (forms should be already notarized-we do not provide this service).      Take these medicines the morning of surgery with A SIP OF WATER: Carvedilol;Oxycodone if needed for pain;Tramadol if needed for pain.                               You may not have any metal on your body including hair pins and piercings  Do not wear jewelry, make-up, lotions, powders, or deodorant.  Do not shave body hair  48 hours(2 days) of CHG soap use.               Do not bring valuables to the hospital. [place].  Contacts, dentures or bridgework may not be worn into surgery.             Leave suitcase in car. After surgery it may be brought to your room.             For patients admitted to the hospital,checkout time is 11:00 AM the day of surgery.      Special Instructions: review fact sheets for  Incentive Spirometry. ________________________________________________________________________  [facility] - Preparing for Surgery Before surgery, you can play an important role.  Because skin is not sterile, your skin needs to be as free of germs as possible.  You can reduce the number of germs on your skin by washing with CHG (chlorahexidine gluconate) soap before surgery.  CHG is an antiseptic cleaner which kills germs and bonds with the skin to continue killing germs even after washing. Please DO NOT use if you have an allergy to CHG  or antibacterial soaps.  If your skin becomes reddened/irritated stop using the CHG and inform your nurse when you arrive at Short Stay. Do not shave (including legs and underarms) for at least 48 hours prior to the first CHG shower.  You may shave your face/neck. Please follow these instructions carefully:  1.  Shower with CHG Soap the night before surgery and the  morning of Surgery.  2.  If you choose to wash your hair, wash your hair first as usual with your  normal  shampoo.  3.  After you shampoo, rinse your hair and body thoroughly to remove the  shampoo.                           4.  Use CHG as you would any other liquid soap.  You can apply chg directly  to the skin and wash                       Gently with a scrungie or clean washcloth.  5.  Apply the CHG Soap to your body ONLY FROM THE NECK DOWN.  Do not use on face/ open                           Wound or open sores. Avoid contact with eyes, ears mouth and genitals (private parts).                       Wash face,  Genitals (private parts) with your normal soap.             6.  Wash thoroughly, paying special attention to the area where your surgery  will be performed.  7.  Thoroughly rinse your body with warm water from the neck down.  8.  DO NOT shower/wash with your normal soap after using and rinsing off  the CHG Soap.                9.  Pat yourself dry with a clean towel.            10.  Wear clean pajamas.            11.  Place clean sheets on your bed the night of your first shower and do not  sleep with pets. Day of Surgery : Do not apply any lotions/deodorants the morning of surgery.  Please wear clean clothes to the hospital/surgery center.  FAILURE TO FOLLOW THESE INSTRUCTIONS MAY RESULT IN THE CANCELLATION OF YOUR SURGERY PATIENT SIGNATURE_________________________________  NURSE SIGNATURE__________________________________  ________________________________________________________________________    CLEAR LIQUID  DIET   Foods Allowed                                                                     Foods Excluded  Coffee and tea, regular and decaf                             liquids that you cannot  Plain Jell-O in any flavor                                             see through such as: Fruit ices (not with fruit pulp)                                     milk, soups, orange juice  Iced Popsicles                                    All solid food Carbonated beverages, regular and diet                                    Cranberry, grape and apple juices Sports drinks like Gatorade Lightly seasoned clear broth or consume(fat free) Sugar, honey syrup  Sample Menu Breakfast  Lunch                                     Supper Cranberry juice                    Beef broth                            Chicken broth Jell-O                                     Grape juice                           Apple juice Coffee or tea                        Jell-O                                      Popsicle                                                Coffee or tea                        Coffee or tea  _____________________________________________________________________    Incentive Spirometer  An incentive spirometer is a tool that can help keep your lungs clear and active. This tool measures how well you are filling your lungs with each breath. Taking long deep breaths may help reverse or decrease the chance of developing breathing (pulmonary) problems (especially infection) following:  A long period of time when you are unable to move or be active. BEFORE THE PROCEDURE   If the spirometer includes an indicator to show your best effort, your nurse or respiratory therapist will set it to a desired goal.  If possible, sit up straight or lean slightly forward. Try not to slouch.  Hold the incentive spirometer in an upright position. INSTRUCTIONS FOR USE  1. Sit on the edge of  your bed if possible, or sit up as far as you can in bed or on a chair. 2. Hold the incentive spirometer in an upright position. 3. Breathe out normally. 4. Place the mouthpiece in your mouth and seal your lips tightly around it. 5. Breathe in slowly and as deeply as possible, raising the piston or the ball toward the top of the column. 6. Hold your breath for 3-5 seconds or for as long as possible. Allow the piston or ball to fall to the bottom of the column. 7. Remove the mouthpiece from your mouth and breathe out normally. 8. Rest for a few seconds and repeat Steps 1 through 7 at least 10 times every 1-2 hours when you are awake. Take your time and take a few normal breaths between deep breaths. 9. The spirometer may include an indicator to show your best effort. Use the indicator as a goal to work toward during each repetition. 10. After each set of 10 deep breaths,  practice coughing to be sure your lungs are clear. If you have an incision (the cut made at the time of surgery), support your incision when coughing by placing a pillow or rolled up towels firmly against it. Once you are able to get out of bed, walk around indoors and cough well. You may stop using the incentive spirometer when instructed by your caregiver.  RISKS AND COMPLICATIONS  Take your time so you do not get dizzy or light-headed.  If you are in pain, you may need to take or ask for pain medication before doing incentive spirometry. It is harder to take a deep breath if you are having pain. AFTER USE  Rest and breathe slowly and easily.  It can be helpful to keep track of a log of your progress. Your caregiver can provide you with a simple table to help with this. If you are using the spirometer at home, follow these instructions: Long IF:   You are having difficultly using the spirometer.  You have trouble using the spirometer as often as instructed.  Your pain medication is not giving enough relief  while using the spirometer.  You develop fever of 100.5 F (38.1 C) or higher. SEEK IMMEDIATE MEDICAL CARE IF:   You cough up bloody sputum that had not been present before.  You develop fever of 102 F (38.9 C) or greater.  You develop worsening pain at or near the incision site. MAKE SURE YOU:   Understand these instructions.  Will watch your condition.  Will get help right away if you are not doing well or get worse. Document Released: 02/05/2007 Document Revised: 12/18/2011 Document Reviewed: 04/08/2007 [facility] Patient Information 2014 [place], [place].   ________________________________________________________________________

## 2014-07-19 NOTE — H&P (Signed)
CC- Colleen Shannon is a 58 y.o. female who presents with right knee stiffness.  HPI- . Knee Pain: Patient presents with stiffness involving the  right knee. Onset of the symptoms was several weeks ago. Inciting event: Right TKA on 05/11/14. She has had difficulty acheiving acceptable range of motion despite adequate physical therapy. Current symptoms include stiffness.  Patient has had prior knee problems..Treatment to date: PT which was somewhat effective.  Past Medical History  Diagnosis Date  . Endometrial polyp   . Hypertension 05/22/12    ECHO-EF >55% trace tricupsid regurgitation. There is mild mitral regurgitation.   . Elevated cholesterol 05/22/12    Nuc stress test-Normal. The patiet had a markedly positive GXT for ischemia. post stress EF 72%  . Arthritis   . Cancer     hx of skin cancer     Past Surgical History  Procedure Laterality Date  . Cesarean section      X 2  . Tubal ligation    . Dilation and curettage of uterus    . Hysteroscopy    . Cholecystectomy    . Abdominal surgery      Abdominoplasty  . Foot surgery    . Knee surgery      arthroscopic  . Total knee arthroplasty Right 05/11/2014    Procedure: RIGHT TOTAL KNEE ARTHROPLASTY;  Surgeon: [name], MD;  Location: [facility] ORS;  Service: Orthopedics;  Laterality: Right;  . Colonscopy      Prior to Admission medications   Medication Sig Start Date End Date Taking? Authorizing Provider  acetaminophen (TYLENOL) 325 MG tablet Take 650 mg by mouth once as needed for mild pain or headache.    Historical Provider, MD  amLODipine (NORVASC) 5 MG tablet Take 5 mg by mouth at bedtime.    Historical Provider, MD  amoxicillin (AMOXIL) 500 MG capsule Take 500 mg by mouth. Takes 4 prior to dental procedure  Has had in last 30 days    Historical Provider, MD  aspirin EC 81 MG tablet Take 81 mg by mouth every morning.    Historical Provider, MD  atorvastatin (LIPITOR) 40 MG tablet Take 40 mg by mouth at bedtime.     Historical Provider, MD  Calcium Carbonate-Vit D-Min (CALCIUM 1200 PO) Take 1 tablet by mouth every morning.    Historical Provider, MD  carvedilol (COREG) 6.25 MG tablet Take 6.25 mg by mouth 2 (two) times daily with a meal.    Historical Provider, MD  cholecalciferol (VITAMIN D) 1000 UNITS tablet Take 1,000 Units by mouth every morning.    Historical Provider, MD  estradiol (ESTRACE) 0.5 MG tablet Take 0.5 mg by mouth every morning.    Historical Provider, MD  oxyCODONE (OXY IR/ROXICODONE) 5 MG immediate release tablet Take 1-2 tablets (5-10 mg total) by mouth every 3 (three) hours as needed for moderate pain, severe pain or breakthrough pain. 05/13/14   Arlee Muslim, PA-C  progesterone (PROMETRIUM) 100 MG capsule Take 100 mg by mouth See admin instructions. Nightly at bedtime just the first 12 days of the month.    Historical Provider, MD  traMADol (ULTRAM) 50 MG tablet Take 50-100 mg by mouth every 6 (six) hours as needed for moderate pain.    Historical Provider, MD   KNEE EXAM antalgic gait, no effusion, reduced range of motion range 5-100 degrees  Physical Examination: General appearance - alert, well appearing, and in no distress Mental status - alert, oriented to person, place, and time Chest -  clear to auscultation, no wheezes, rales or rhonchi, symmetric air entry Heart - normal rate, regular rhythm, normal S1, S2, no murmurs, rubs, clicks or gallops Abdomen - soft, nontender, nondistended, no masses or organomegaly Neurological - alert, oriented, normal speech, no focal findings or movement disorder noted   Asessment/Plan--- Right knee arthrofibrosis- - Plan right knee closed manipulation. Procedure risks and potential comps discussed with patient who elects to proceed. Goals are decreased pain and increased function with a high likelihood of achieving both

## 2014-07-20 ENCOUNTER — Encounter (HOSPITAL_COMMUNITY)
Admission: RE | Disposition: A | Discharge status: Home / Self Care | Payer: Self-pay | Source: Ambulatory Visit | Attending: Orthopedic Surgery

## 2014-07-20 ENCOUNTER — Encounter (HOSPITAL_COMMUNITY): Payer: BC Managed Care – PPO | Admitting: Anesthesiology

## 2014-07-20 ENCOUNTER — Ambulatory Visit (HOSPITAL_COMMUNITY): Payer: BC Managed Care – PPO | Admitting: Anesthesiology

## 2014-07-20 ENCOUNTER — Encounter (HOSPITAL_COMMUNITY): Payer: Self-pay | Admitting: Anesthesiology

## 2014-07-20 ENCOUNTER — Ambulatory Visit (HOSPITAL_COMMUNITY)
Admission: RE | Admit: 2014-07-20 | Discharge: 2014-07-20 | Disposition: A | Discharge status: Home / Self Care | Payer: BC Managed Care – PPO | Source: Ambulatory Visit | Attending: Orthopedic Surgery | Admitting: Orthopedic Surgery

## 2014-07-20 DIAGNOSIS — Z96659 Presence of unspecified artificial knee joint: Secondary | ICD-10-CM

## 2014-07-20 DIAGNOSIS — I10 Essential (primary) hypertension: Secondary | ICD-10-CM | POA: Insufficient documentation

## 2014-07-20 DIAGNOSIS — M1990 Unspecified osteoarthritis, unspecified site: Secondary | ICD-10-CM | POA: Insufficient documentation

## 2014-07-20 DIAGNOSIS — E780 Pure hypercholesterolemia: Secondary | ICD-10-CM | POA: Insufficient documentation

## 2014-07-20 DIAGNOSIS — M24661 Ankylosis, right knee: Secondary | ICD-10-CM | POA: Insufficient documentation

## 2014-07-20 DIAGNOSIS — Z85828 Personal history of other malignant neoplasm of skin: Secondary | ICD-10-CM | POA: Insufficient documentation

## 2014-07-20 DIAGNOSIS — M25669 Stiffness of unspecified knee, not elsewhere classified: Secondary | ICD-10-CM | POA: Diagnosis present

## 2014-07-20 DIAGNOSIS — T8489XA Other specified complication of internal orthopedic prosthetic devices, implants and grafts, initial encounter: Secondary | ICD-10-CM | POA: Diagnosis present

## 2014-07-20 DIAGNOSIS — T84018A Broken internal joint prosthesis, other site, initial encounter: Secondary | ICD-10-CM | POA: Diagnosis present

## 2014-07-20 HISTORY — PX: KNEE CLOSED REDUCTION: SHX995

## 2014-07-20 SURGERY — MANIPULATION, KNEE, CLOSED
Anesthesia: General | Site: Knee | Laterality: Right

## 2014-07-20 MED ORDER — FENTANYL CITRATE 0.05 MG/ML IJ SOLN
INTRAMUSCULAR | Status: DC | PRN
  Administered 2014-07-20 (×2): 50 ug via INTRAVENOUS

## 2014-07-20 MED ORDER — LIDOCAINE HCL (CARDIAC) 20 MG/ML IV SOLN
INTRAVENOUS | Status: DC | PRN
  Administered 2014-07-20: 50 mg via INTRAVENOUS

## 2014-07-20 MED ORDER — ACETAMINOPHEN 10 MG/ML IV SOLN
1000.0000 mg | Freq: Once | INTRAVENOUS | Status: AC
  Administered 2014-07-20: 1000 mg via INTRAVENOUS
  Filled 2014-07-20 (×2): qty 100

## 2014-07-20 MED ORDER — PROPOFOL 10 MG/ML IV BOLUS
INTRAVENOUS | Status: AC
  Filled 2014-07-20: qty 20

## 2014-07-20 MED ORDER — MIDAZOLAM HCL 2 MG/2ML IJ SOLN
INTRAMUSCULAR | Status: AC
  Filled 2014-07-20: qty 2

## 2014-07-20 MED ORDER — DEXTROSE 5 % IV SOLN
500.0000 mg | Freq: Once | INTRAVENOUS | Status: AC
  Administered 2014-07-20: 500 mg via INTRAVENOUS
  Filled 2014-07-20: qty 5

## 2014-07-20 MED ORDER — PROPOFOL 10 MG/ML IV BOLUS
INTRAVENOUS | Status: DC | PRN
  Administered 2014-07-20: 150 mg via INTRAVENOUS

## 2014-07-20 MED ORDER — LACTATED RINGERS IV SOLN: INTRAVENOUS | Status: DC

## 2014-07-20 MED ORDER — OXYCODONE HCL 5 MG PO TABS: 5.0000 mg | ORAL_TABLET | ORAL | Status: DC | PRN

## 2014-07-20 MED ORDER — MIDAZOLAM HCL 5 MG/5ML IJ SOLN
INTRAMUSCULAR | Status: DC | PRN
  Administered 2014-07-20: 2 mg via INTRAVENOUS

## 2014-07-20 MED ORDER — CHLORHEXIDINE GLUCONATE 4 % EX LIQD: 60.0000 mL | Freq: Once | CUTANEOUS | Status: DC

## 2014-07-20 MED ORDER — PROMETHAZINE HCL 25 MG/ML IJ SOLN
6.2500 mg | INTRAMUSCULAR | Status: DC | PRN
  Administered 2014-07-20: 6.25 mg via INTRAVENOUS

## 2014-07-20 MED ORDER — HYDROMORPHONE HCL 1 MG/ML IJ SOLN
INTRAMUSCULAR | Status: AC
  Filled 2014-07-20: qty 1

## 2014-07-20 MED ORDER — DEXAMETHASONE SODIUM PHOSPHATE 10 MG/ML IJ SOLN: 10.0000 mg | Freq: Once | INTRAMUSCULAR | Status: DC

## 2014-07-20 MED ORDER — FENTANYL CITRATE 0.05 MG/ML IJ SOLN
INTRAMUSCULAR | Status: AC
  Filled 2014-07-20: qty 2

## 2014-07-20 MED ORDER — SODIUM CHLORIDE 0.9 % IV SOLN: INTRAVENOUS | Status: DC

## 2014-07-20 MED ORDER — HYDROMORPHONE HCL 1 MG/ML IJ SOLN
INTRAMUSCULAR | Status: DC
Start: 2014-07-20 — End: 2014-07-20
  Filled 2014-07-20: qty 1

## 2014-07-20 MED ORDER — LACTATED RINGERS IV SOLN
INTRAVENOUS | Status: DC | PRN
  Administered 2014-07-20: 16:00:00 via INTRAVENOUS

## 2014-07-20 MED ORDER — PROMETHAZINE HCL 25 MG/ML IJ SOLN
INTRAMUSCULAR | Status: DC
Start: 2014-07-20 — End: 2014-07-20
  Filled 2014-07-20: qty 1

## 2014-07-20 MED ORDER — FENTANYL CITRATE 0.05 MG/ML IJ SOLN
25.0000 ug | INTRAMUSCULAR | Status: DC | PRN
  Administered 2014-07-20 (×2): 25 ug via INTRAVENOUS

## 2014-07-20 MED ORDER — CEFAZOLIN SODIUM-DEXTROSE 2-3 GM-% IV SOLR: 2.0000 g | INTRAVENOUS | Status: DC

## 2014-07-20 MED ORDER — HYDROMORPHONE HCL 1 MG/ML IJ SOLN
0.2500 mg | INTRAMUSCULAR | Status: DC | PRN
  Administered 2014-07-20 (×4): 0.5 mg via INTRAVENOUS

## 2014-07-20 SURGICAL SUPPLY — 14 items
BANDAGE ADH SHEER 1  50/CT (GAUZE/BANDAGES/DRESSINGS) IMPLANT
GAUZE SPONGE 4X4 12PLY STRL (GAUZE/BANDAGES/DRESSINGS) IMPLANT
GLOVE BIO SURGEON STRL SZ7.5 (GLOVE) ×2 IMPLANT
GLOVE BIOGEL PI IND STRL 8 (GLOVE) ×1 IMPLANT
GLOVE BIOGEL PI INDICATOR 8 (GLOVE) ×1
GOWN STRL REUS W/TWL LRG LVL3 (GOWN DISPOSABLE) ×2 IMPLANT
GOWN STRL REUS W/TWL XL LVL3 (GOWN DISPOSABLE) ×2 IMPLANT
MANIFOLD NEPTUNE II (INSTRUMENTS) ×2 IMPLANT
NDL SAFETY ECLIPSE 18X1.5 (NEEDLE) IMPLANT
NEEDLE HYPO 18GX1.5 SHARP (NEEDLE)
PADDING CAST COTTON 6X4 STRL (CAST SUPPLIES) ×4 IMPLANT
POSITIONER SURGICAL ARM (MISCELLANEOUS) ×2 IMPLANT
SYR CONTROL 10ML LL (SYRINGE) IMPLANT
TOWEL OR 17X26 10 PK STRL BLUE (TOWEL DISPOSABLE) ×4 IMPLANT

## 2014-07-20 NOTE — Op Note (Signed)
  OPERATIVE REPORT   PREOPERATIVE DIAGNOSIS: Arthrofibrosis, Right  knee.   POSTOPERATIVE DIAGNOSIS: Arthrofibrosis, Right knee.   PROCEDURE:  Right  knee closed manipulation.   SURGEON: [name], MD   ASSISTANT: None.   ANESTHESIA: General.   COMPLICATIONS: None.   CONDITION: Stable to Recovery.   Pre-manipulation range of motion is 5-100.  Post-manipulation range of  Motion is 0-140  PROCEDURE IN DETAIL: After successful administration of general  anesthetic, exam under anesthesia was performed showing range of motion  5-100 degrees. I then placed my chest against the proximal tibia,  flexing the knee with audible lysis of adhesions. I was easily able to  get the knee flexed to 140  degrees. I then put the knee back in extension and with some  patellar manipulation and gentle pressure got to full  Extension.The patient was subsequently awakened and transported to Recovery in  stable condition.

## 2014-07-20 NOTE — Transfer of Care (Signed)
Immediate Anesthesia Transfer of Care Note  Patient: Colleen Shannon  Procedure(s) Performed: Procedure(s) (LRB): RIGHT KNEE CLOSED MANIPULATION (Right)  Patient Location: PACU  Anesthesia Type: General  Level of Consciousness: sedated, patient cooperative and responds to stimulation  Airway & Oxygen Therapy: Patient Spontanous Breathing and Patient connected to face mask oxgen  Post-op Assessment: Report given to PACU RN and Post -op Vital signs reviewed and stable  Post vital signs: Reviewed and stable  Complications: No apparent anesthesia complications

## 2014-07-20 NOTE — Interval H&P Note (Signed)
History and Physical Interval Note:  07/20/2014 4:54 PM  Colleen Shannon  has presented today for surgery, with the diagnosis of ARTHROFIBROSIS RIGHT KNEE   The various methods of treatment have been discussed with the patient and family. After consideration of risks, benefits and other options for treatment, the patient has consented to  Procedure(s): RIGHT KNEE CLOSED MANIPULATION (Right) as a surgical intervention .  The patient's history has been reviewed, patient examined, no change in status, stable for surgery.  I have reviewed the patient's chart and labs.  Questions were answered to the patient's satisfaction.     [name]

## 2014-07-20 NOTE — Anesthesia Preprocedure Evaluation (Signed)
Anesthesia Evaluation  Patient identified by MRN, date of birth, ID band Patient awake    Reviewed: Allergy & Precautions, H&P , NPO status , Patient's Chart, lab work & pertinent test results  Airway Mallampati: II TM Distance: >3 FB Neck ROM: Full    Dental no notable dental hx.    Pulmonary neg pulmonary ROS,  breath sounds clear to auscultation  Pulmonary exam normal       Cardiovascular Exercise Tolerance: Good hypertension, Pt. on medications + Peripheral Vascular Disease Rhythm:Regular Rate:Normal     Neuro/Psych negative neurological ROS  negative psych ROS   GI/Hepatic negative GI ROS, Neg liver ROS,   Endo/Other  negative endocrine ROS  Renal/GU negative Renal ROS  negative genitourinary   Musculoskeletal  (+) Arthritis -,   Abdominal   Peds negative pediatric ROS (+)  Hematology negative hematology ROS (+)   Anesthesia Other Findings   Reproductive/Obstetrics negative OB ROS                           Anesthesia Physical Anesthesia Plan  ASA: II  Anesthesia Plan: General   Post-op Pain Management:    Induction: Intravenous  Airway Management Planned: LMA  Additional Equipment:   Intra-op Plan:   Post-operative Plan: Extubation in OR  Informed Consent: I have reviewed the patients History and Physical, chart, labs and discussed the procedure including the risks, benefits and alternatives for the proposed anesthesia with the patient or authorized representative who has indicated his/her understanding and acceptance.   Dental advisory given  Plan Discussed with: CRNA  Anesthesia Plan Comments:         Anesthesia Quick Evaluation

## 2014-07-21 ENCOUNTER — Encounter (HOSPITAL_COMMUNITY): Payer: Self-pay | Admitting: Orthopedic Surgery

## 2014-07-21 NOTE — Anesthesia Postprocedure Evaluation (Signed)
  Anesthesia Post-op Note  Patient: Colleen Shannon  Procedure(s) Performed: Procedure(s) (LRB): RIGHT KNEE CLOSED MANIPULATION (Right)  Patient Location: PACU  Anesthesia Type: General  Level of Consciousness: awake and alert   Airway and Oxygen Therapy: Patient Spontanous Breathing  Post-op Pain: mild  Post-op Assessment: Post-op Vital signs reviewed, Patient's Cardiovascular Status Stable, Respiratory Function Stable, Patent Airway and No signs of Nausea or vomiting  Last Vitals:  Filed Vitals:   07/20/14 1908  BP: 145/72  Pulse: 78  Temp: 36.7 C  Resp: 14    Post-op Vital Signs: stable   Complications: No apparent anesthesia complications

## 2014-07-29 ENCOUNTER — Encounter: Payer: Self-pay | Admitting: Cardiovascular Disease

## 2014-07-29 ENCOUNTER — Ambulatory Visit (INDEPENDENT_AMBULATORY_CARE_PROVIDER_SITE_OTHER): Payer: BC Managed Care – PPO | Admitting: Cardiovascular Disease

## 2014-07-29 VITALS — BP 133/78 | HR 83 | Wt 141.1 lb

## 2014-07-29 DIAGNOSIS — I10 Essential (primary) hypertension: Secondary | ICD-10-CM | POA: Insufficient documentation

## 2014-07-29 DIAGNOSIS — E785 Hyperlipidemia, unspecified: Secondary | ICD-10-CM | POA: Insufficient documentation

## 2014-07-29 NOTE — Progress Notes (Signed)
Patient ID: Colleen Shannon, female   DOB: Dec 07, 1955, 58 y.o.   MRN: [id number]     HPI: Colleen Shannon is a 58 y.o. female presents to the office today for a one-year cardiology evaluation.  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  Colleen Shannon has a history of hypertension and previous chest pain for which he was started on carvedilol and initially nifedipine by Dr. Elisabeth Cara with a presumptive dose of possible spasm. In August 2013 a nuclear perfusion study was entirely normal. 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 She has been on lipid-lowering therapy with atorvastatin. When I saw her 2 years ago I discontinued her nifedipine and switched her to amlodipine. She states her blood pressure has been controlled on this regimen. An echo Doppler study in August 2013 showed normal systolic function. She did have mild mitral regurgitation.  Over the past year, she has done well from a cardiac standpoint.  She denies any episodes of chest pain.  He denies palpitations.  She is now followed by Dr. Carol Ada for primary care.  Recent laboratory had been drawn for which she was told was excellent.  Colleen Shannon underwent right knee replacement surgery by Dr. Sandie Ano on 05/11/2014.  She tolerated that well from a cardiovascular standpoint.  Due 2 excessive scar tissue.  She underwent manipulation one week ago.  An ECG at that time showed normal sinus rhythm without change.  She presents for one-year cardiology evaluation.   Past Medical History  Diagnosis Date  . Endometrial polyp   . Hypertension 05/22/12    ECHO-EF >55% trace tricupsid regurgitation. There is mild mitral regurgitation.   . Elevated cholesterol 05/22/12    Nuc stress test-Normal. The patiet had a markedly positive GXT for ischemia. post stress EF 72%  . Arthritis   . Cancer     hx of skin cancer     Past  Surgical History  Procedure Laterality Date  . Cesarean section      X 2  . Tubal ligation    . Dilation and curettage of uterus    . Hysteroscopy    . Cholecystectomy    . Abdominal surgery      Abdominoplasty  . Foot surgery    . Knee surgery      arthroscopic  . Total knee arthroplasty Right 05/11/2014    Procedure: RIGHT TOTAL KNEE ARTHROPLASTY;  Surgeon: [name], MD;  Location: [facility];  Service: Orthopedics;  Laterality: Right;  . Colonscopy    . Knee closed reduction Right 07/20/2014    Procedure: RIGHT KNEE CLOSED MANIPULATION;  Surgeon: [name], MD;  Location: [facility];  Service: Orthopedics;  Laterality: Right;    No Known Allergies  Current Outpatient Prescriptions  Medication Sig Dispense Refill  . acetaminophen (TYLENOL) 325 MG tablet Take 650 mg by mouth once as needed for mild pain or headache.      Marland Kitchen amLODipine (NORVASC) 5 MG tablet Take 5 mg by mouth at bedtime.      Marland Kitchen amoxicillin (AMOXIL) 500 MG capsule Take 500 mg by mouth. Takes 4 prior to dental procedure  Has had in last 30 days      . aspirin EC 81 MG tablet Take 81 mg by mouth every morning.      Marland Kitchen atorvastatin (LIPITOR) 40 MG tablet Take 40 mg by  mouth at bedtime.      . Calcium Carbonate-Vit D-Min (CALCIUM 1200 PO) Take 1 tablet by mouth every morning.      . carvedilol (COREG) 6.25 MG tablet Take 6.25 mg by mouth 2 (two) times daily with a meal.      . cholecalciferol (VITAMIN D) 1000 UNITS tablet Take 1,000 Units by mouth every morning.      Marland Kitchen estradiol (ESTRACE) 0.5 MG tablet Take 0.5 mg by mouth every morning.      Marland Kitchen oxyCODONE (OXY IR/ROXICODONE) 5 MG immediate release tablet Take 1-2 tablets (5-10 mg total) by mouth every 3 (three) hours as needed for moderate pain, severe pain or breakthrough pain.  30 tablet  0  . progesterone (PROMETRIUM) 100 MG capsule Take 100 mg by mouth See admin instructions. Nightly at bedtime just the first 12 days of the month.      . traMADol (ULTRAM) 50 MG  tablet Take 50-100 mg by mouth every 6 (six) hours as needed for moderate pain.       No current facility-administered medications for this visit.    History   Social History  . Marital Status: Married    Spouse Name: N/A    Number of Children: N/A  . Years of Education: N/A   Occupational History  . Not on file.   Social History Main Topics  . Smoking status: Never Smoker   . Smokeless tobacco: Never Used  . Alcohol Use: 0.5 oz/week    1 drink(s) per week     Comment: occasional   . Drug Use: No  . Sexual Activity: Yes    Birth Control/ Protection: Surgical, Post-menopausal   Other Topics Concern  . Not on file   Social History Narrative  . No narrative on file   Additional social history is notable that she is married for 33 years. 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 There is no tobacco use. He does drink occasional wine. She does exercise fairly regularly on a bike and elliptical machine.  Family History  Problem Relation Age of Onset  . Heart disease Father   . Hypertension Paternal Grandfather   . Stroke Paternal Grandfather   . Aneurysm Mother   . Hyperlipidemia Mother   . Heart disease Maternal Grandfather   . Hypertension Brother   . Cancer - Other Paternal Grandmother     uterine   ROS General: Negative; No fevers, chills, or night sweats;  HEENT: Negative; No changes in vision or hearing, sinus congestion, difficulty swallowing Pulmonary: Negative; No cough, wheezing, shortness of breath, hemoptysis Cardiovascular: Negative; No chest pain, presyncope, syncope, palpitations GI: Negative; No nausea, vomiting, diarrhea, or abdominal pain GU: Negative; No dysuria, hematuria, or difficulty voiding Musculoskeletal: Positive for recent right knee replacement surgery;  no myalgias, Hematologic/Oncology: Negative; no easy bruising, bleeding Endocrine: Negative; no heat/cold intolerance; no diabetes Neuro: Negative;  no changes in balance, headaches Skin: Negative; No rashes or skin lesions Psychiatric: Negative; No behavioral problems, depression Sleep: Negative; No snoring, daytime sleepiness, hypersomnolence, bruxism, restless legs, hypnogognic hallucinations, no cataplexy Other comprehensive 14 point system review is negative.  PE BP 133/78  Pulse 83  Wt 141 lb 1.6 oz (64.003 kg)  LMP 11/19/2011  General: Alert, oriented, no distress.  Skin: normal turgor, no rashes HEENT: Normocephalic, atraumatic. Pupils round and reactive; sclera anicteric;no lid lag.  Nose without nasal septal hypertrophy Mouth/Parynx benign; Mallinpatti scale 2 Neck: No JVD, no carotid  bruits with normal carotid upstroke Chest wall: Nontender to palpation Lungs: clear to ausculatation and percussion; no wheezing or rales Heart: RRR, s1 s2 normal 1/6 systolic murmur at the apex compatible with mitral regurgitation Abdomen: soft, nontender; no hepatosplenomehaly, BS+; abdominal aorta nontender and not dilated by palpation. Back: No CVA tenderness Pulses 2+ Extremities: no clubbing cyanosis or edema, Homan's sign negative  Neurologic: grossly nonfocal Psychological: Normal affect and mood  ECG (independently read by me) from 07/17/2014: Normal sinus rhythm.  No ectopy.  Normal intervals.  No ST segment changes.  August 2014 ECG: Normal sinus rhythm at 75 beats per minute without ectopy. Normal intervals.  LABS:  BMET    Component Value Date/Time   NA 141 07/17/2014 1025     Hepatic Function Panel     Component Value Date/Time   PROT 7.1 04/30/2014 1055     CBC    Component Value Date/Time   WBC 12.9* 07/17/2014 1025     BNP No results found for this basename: probnp    Lipid Panel  No results found for this basename: chol,  trig,  hdl,  cholhdl,  vldl,  ldlcalc     RADIOLOGY: No results found.    ASSESSMENT AND PLAN: Ms. Jennessy Sandridge continues to do well and has remained cardiac stable.  She  states her blood pressure at home typically have run in the 110-120 range.  She is on carvedilol 6.25 mg twice a day.  Amlodipine 5 mg per day.  Her blood pressure today was 133/78.  She does have hyperlipidemia and is tolerating atorvastatin 40 mg.  She was told that lipid studies were very good on laboratory done by Dr. Tamala Julian.  Genetically, she is a carrier.  The caveat 6 Lelan a double carrier for high risk 9P 21 oversewn.  She is on lipid lowering therapy.  A nuclear perfusion study has been normal.   She has normal LV function on echo Doppler assessment and had borderline LVH.  She is on a baby aspirin 81 mg antiplatelet regimen.  Presently, she is doing well.  I recommended she continue her current medical regimen.  I will see her in one year for followup evaluation or sooner if problems arise.   Troy Sine, MD, [facility]  07/29/2014 6:22 PM

## 2014-07-29 NOTE — Patient Instructions (Signed)
Your physician wants you to follow-up in: 1 year or sooner if needed. You will receive a reminder letter in the mail two months in advance. If you don't receive a letter, please call our office to schedule the follow-up appointment. No changes were made today in your therapy.

## 2014-08-04 ENCOUNTER — Telehealth: Payer: Self-pay | Admitting: *Deleted

## 2014-08-04 NOTE — Telephone Encounter (Signed)
Patient had walked into the office today questioning if we had received her lab work from her PCP. There are no labs in the patients chart or in dr [facility] box for review. Spoke qwith dr smith's office to have them fax [place] the results.

## 2014-08-10 ENCOUNTER — Encounter: Payer: Self-pay | Admitting: Cardiovascular Disease

## 2014-08-25 ENCOUNTER — Other Ambulatory Visit: Payer: Self-pay | Admitting: Cardiovascular Disease

## 2014-08-25 NOTE — Telephone Encounter (Signed)
Rx has been sent to the pharmacy electronically. ° °

## 2014-08-27 ENCOUNTER — Ambulatory Visit
Admission: RE | Admit: 2014-08-27 | Discharge: 2014-08-27 | Disposition: A | Discharge status: Home / Self Care | Payer: BC Managed Care – PPO | Source: Ambulatory Visit | Attending: Physician Assistant | Admitting: Physician Assistant

## 2014-08-27 ENCOUNTER — Other Ambulatory Visit: Payer: Self-pay | Admitting: Physician Assistant

## 2014-08-27 DIAGNOSIS — J209 Acute bronchitis, unspecified: Secondary | ICD-10-CM

## 2014-09-01 ENCOUNTER — Other Ambulatory Visit: Payer: Self-pay | Admitting: Cardiovascular Disease

## 2014-09-01 NOTE — Telephone Encounter (Signed)
Rx refill sent to patient pharmacy   

## 2014-09-02 ENCOUNTER — Other Ambulatory Visit: Payer: Self-pay | Admitting: Cardiovascular Disease

## 2014-09-21 ENCOUNTER — Other Ambulatory Visit: Payer: Self-pay | Admitting: Radiology

## 2014-09-24 ENCOUNTER — Other Ambulatory Visit (INDEPENDENT_AMBULATORY_CARE_PROVIDER_SITE_OTHER): Payer: Self-pay | Admitting: General Surgery

## 2014-09-30 ENCOUNTER — Encounter (HOSPITAL_BASED_OUTPATIENT_CLINIC_OR_DEPARTMENT_OTHER): Payer: Self-pay | Admitting: *Deleted

## 2014-09-30 ENCOUNTER — Encounter (HOSPITAL_BASED_OUTPATIENT_CLINIC_OR_DEPARTMENT_OTHER)
Admission: RE | Admit: 2014-09-30 | Discharge: 2014-09-30 | Disposition: A | Discharge status: Home / Self Care | Payer: BC Managed Care – PPO | Source: Ambulatory Visit | Attending: General Surgery | Admitting: General Surgery

## 2014-09-30 DIAGNOSIS — Z01812 Encounter for preprocedural laboratory examination: Secondary | ICD-10-CM | POA: Diagnosis not present

## 2014-09-30 LAB — BASIC METABOLIC PANEL
ANION GAP: 8 (ref 5–15)
BUN: 14 mg/dL (ref 6–23)
CO2: 27 mmol/L (ref 19–32)
Calcium: 9.5 mg/dL (ref 8.4–10.5)
Chloride: 106 mEq/L (ref 96–112)
Creatinine, Ser: 0.64 mg/dL (ref 0.50–1.10)
GFR calc non Af Amer: >90 mL/min (ref >90)
Glucose, Bld: 97 mg/dL (ref 70–99)
POTASSIUM: 4.3 mmol/L (ref 3.5–5.1)
Sodium: 141 mmol/L (ref 135–145)

## 2014-09-30 LAB — CBC WITH DIFFERENTIAL/PLATELET
BASOS PCT: 1 % (ref 0–1)
Basophils Absolute: 0.1 10*3/uL (ref 0.0–0.1)
Eosinophils Absolute: 0.3 10*3/uL (ref 0.0–0.7)
Eosinophils Relative: 2 % (ref 0–5)
HEMATOCRIT: 40 % (ref 36.0–46.0)
HEMOGLOBIN: 12.9 g/dL (ref 12.0–15.0)
LYMPHS PCT: 71 % — AB (ref 12–46)
Lymphs Abs: 10 10*3/uL — ABNORMAL HIGH (ref 0.7–4.0)
MCH: 29.1 pg (ref 26.0–34.0)
MCHC: 32.3 g/dL (ref 30.0–36.0)
MCV: 90.3 fL (ref 78.0–100.0)
MONO ABS: 0.7 10*3/uL (ref 0.1–1.0)
Monocytes Relative: 5 % (ref 3–12)
NEUTROS ABS: 2.9 10*3/uL (ref 1.7–7.7)
NEUTROS PCT: 21 % — AB (ref 43–77)
Platelets: 333 10*3/uL (ref 150–400)
RBC: 4.43 MIL/uL (ref 3.87–5.11)
RDW: 14.7 % (ref 11.5–15.5)
WBC: 14 10*3/uL — ABNORMAL HIGH (ref 4.0–10.5)

## 2014-09-30 NOTE — Progress Notes (Signed)
Pt came in for labs.

## 2014-10-05 LAB — PATHOLOGIST SMEAR REVIEW

## 2014-10-06 ENCOUNTER — Ambulatory Visit (HOSPITAL_BASED_OUTPATIENT_CLINIC_OR_DEPARTMENT_OTHER): Payer: BC Managed Care – PPO | Admitting: Anesthesiology

## 2014-10-06 ENCOUNTER — Encounter (HOSPITAL_BASED_OUTPATIENT_CLINIC_OR_DEPARTMENT_OTHER)
Admission: RE | Disposition: A | Discharge status: Home / Self Care | Payer: Self-pay | Source: Ambulatory Visit | Attending: General Surgery

## 2014-10-06 ENCOUNTER — Ambulatory Visit (HOSPITAL_BASED_OUTPATIENT_CLINIC_OR_DEPARTMENT_OTHER)
Admission: RE | Admit: 2014-10-06 | Discharge: 2014-10-06 | Disposition: A | Discharge status: Home / Self Care | Payer: BC Managed Care – PPO | Source: Ambulatory Visit | Attending: General Surgery | Admitting: General Surgery

## 2014-10-06 ENCOUNTER — Encounter (HOSPITAL_BASED_OUTPATIENT_CLINIC_OR_DEPARTMENT_OTHER): Payer: Self-pay | Admitting: *Deleted

## 2014-10-06 DIAGNOSIS — E780 Pure hypercholesterolemia: Secondary | ICD-10-CM | POA: Insufficient documentation

## 2014-10-06 DIAGNOSIS — R928 Other abnormal and inconclusive findings on diagnostic imaging of breast: Secondary | ICD-10-CM

## 2014-10-06 DIAGNOSIS — N62 Hypertrophy of breast: Secondary | ICD-10-CM | POA: Diagnosis not present

## 2014-10-06 DIAGNOSIS — I10 Essential (primary) hypertension: Secondary | ICD-10-CM | POA: Diagnosis not present

## 2014-10-06 DIAGNOSIS — M1990 Unspecified osteoarthritis, unspecified site: Secondary | ICD-10-CM | POA: Insufficient documentation

## 2014-10-06 HISTORY — PX: RADIOACTIVE SEED GUIDED EXCISIONAL BREAST BIOPSY: SHX6490

## 2014-10-06 LAB — POCT HEMOGLOBIN-HEMACUE: HEMOGLOBIN: 14 g/dL (ref 12.0–15.0)

## 2014-10-06 SURGERY — RADIOACTIVE SEED GUIDED BREAST BIOPSY
Anesthesia: General

## 2014-10-06 MED ORDER — PROMETHAZINE HCL 25 MG/ML IJ SOLN
6.2500 mg | INTRAMUSCULAR | Status: DC | PRN
  Administered 2014-10-06: 6.25 mg via INTRAVENOUS

## 2014-10-06 MED ORDER — CEFAZOLIN SODIUM-DEXTROSE 2-3 GM-% IV SOLR
INTRAVENOUS | Status: AC
  Filled 2014-10-06: qty 50

## 2014-10-06 MED ORDER — OXYCODONE HCL 5 MG/5ML PO SOLN: 5.0000 mg | Freq: Once | ORAL | Status: DC | PRN

## 2014-10-06 MED ORDER — SODIUM CHLORIDE 0.9 % IJ SOLN
INTRAMUSCULAR | Status: AC
  Filled 2014-10-06: qty 10

## 2014-10-06 MED ORDER — LIDOCAINE HCL (CARDIAC) 20 MG/ML IV SOLN
INTRAVENOUS | Status: DC | PRN
  Administered 2014-10-06: 50 mg via INTRAVENOUS

## 2014-10-06 MED ORDER — MIDAZOLAM HCL 2 MG/2ML IJ SOLN
INTRAMUSCULAR | Status: AC
  Filled 2014-10-06: qty 2

## 2014-10-06 MED ORDER — FENTANYL CITRATE 0.05 MG/ML IJ SOLN
INTRAMUSCULAR | Status: AC
  Filled 2014-10-06: qty 6

## 2014-10-06 MED ORDER — CEFAZOLIN SODIUM-DEXTROSE 2-3 GM-% IV SOLR
2.0000 g | INTRAVENOUS | Status: AC
  Administered 2014-10-06: 2 g via INTRAVENOUS

## 2014-10-06 MED ORDER — FENTANYL CITRATE 0.05 MG/ML IJ SOLN: 50.0000 ug | INTRAMUSCULAR | Status: DC | PRN

## 2014-10-06 MED ORDER — HYDROMORPHONE HCL 1 MG/ML IJ SOLN
0.2500 mg | INTRAMUSCULAR | Status: DC | PRN
  Administered 2014-10-06 (×2): 0.5 mg via INTRAVENOUS

## 2014-10-06 MED ORDER — ONDANSETRON HCL 4 MG/2ML IJ SOLN
INTRAMUSCULAR | Status: DC | PRN
  Administered 2014-10-06: 4 mg via INTRAVENOUS

## 2014-10-06 MED ORDER — PROPOFOL 10 MG/ML IV BOLUS
INTRAVENOUS | Status: DC | PRN
  Administered 2014-10-06: 120 mg via INTRAVENOUS

## 2014-10-06 MED ORDER — MIDAZOLAM HCL 5 MG/5ML IJ SOLN
INTRAMUSCULAR | Status: DC | PRN
  Administered 2014-10-06: 2 mg via INTRAVENOUS

## 2014-10-06 MED ORDER — BUPIVACAINE HCL (PF) 0.25 % IJ SOLN
INTRAMUSCULAR | Status: DC | PRN
  Administered 2014-10-06: 10 mL

## 2014-10-06 MED ORDER — OXYCODONE HCL 5 MG PO TABS: 5.0000 mg | ORAL_TABLET | Freq: Once | ORAL | Status: DC | PRN

## 2014-10-06 MED ORDER — PROMETHAZINE HCL 25 MG/ML IJ SOLN
INTRAMUSCULAR | Status: AC
  Filled 2014-10-06: qty 1

## 2014-10-06 MED ORDER — DEXAMETHASONE SODIUM PHOSPHATE 4 MG/ML IJ SOLN
INTRAMUSCULAR | Status: DC | PRN
  Administered 2014-10-06: 10 mg via INTRAVENOUS

## 2014-10-06 MED ORDER — OXYCODONE-ACETAMINOPHEN 10-325 MG PO TABS: 1.0000 | ORAL_TABLET | Freq: Four times a day (QID) | ORAL | Status: DC | PRN

## 2014-10-06 MED ORDER — MIDAZOLAM HCL 2 MG/2ML IJ SOLN: 1.0000 mg | INTRAMUSCULAR | Status: DC | PRN

## 2014-10-06 MED ORDER — LACTATED RINGERS IV SOLN
INTRAVENOUS | Status: DC
  Administered 2014-10-06 (×3): via INTRAVENOUS

## 2014-10-06 MED ORDER — EPHEDRINE SULFATE 50 MG/ML IJ SOLN
INTRAMUSCULAR | Status: DC | PRN
  Administered 2014-10-06: 10 mg via INTRAVENOUS
  Administered 2014-10-06: 5 mg via INTRAVENOUS

## 2014-10-06 MED ORDER — FENTANYL CITRATE 0.05 MG/ML IJ SOLN
INTRAMUSCULAR | Status: DC | PRN
  Administered 2014-10-06 (×2): 25 ug via INTRAVENOUS
  Administered 2014-10-06: 100 ug via INTRAVENOUS

## 2014-10-06 MED ORDER — HYDROMORPHONE HCL 1 MG/ML IJ SOLN
INTRAMUSCULAR | Status: AC
  Filled 2014-10-06: qty 1

## 2014-10-06 SURGICAL SUPPLY — 56 items
APL SKNCLS STERI-STRIP NONHPOA (GAUZE/BANDAGES/DRESSINGS)
APPLIER CLIP 9.375 MED OPEN (MISCELLANEOUS)
APR CLP MED 9.3 20 MLT OPN (MISCELLANEOUS)
BENZOIN TINCTURE PRP APPL 2/3 (GAUZE/BANDAGES/DRESSINGS) IMPLANT
BINDER BREAST LRG (GAUZE/BANDAGES/DRESSINGS) IMPLANT
BINDER BREAST MEDIUM (GAUZE/BANDAGES/DRESSINGS) IMPLANT
BINDER BREAST XLRG (GAUZE/BANDAGES/DRESSINGS) IMPLANT
BINDER BREAST XXLRG (GAUZE/BANDAGES/DRESSINGS) IMPLANT
BLADE SURG 15 STRL LF DISP TIS (BLADE) ×1 IMPLANT
BLADE SURG 15 STRL SS (BLADE) ×2
CANISTER SUC SOCK COL 7IN (MISCELLANEOUS) IMPLANT
CANISTER SUCT 1200ML W/VALVE (MISCELLANEOUS) IMPLANT
CHLORAPREP W/TINT 26ML (MISCELLANEOUS) ×2 IMPLANT
CLIP APPLIE 9.375 MED OPEN (MISCELLANEOUS) IMPLANT
COVER BACK TABLE 60X90IN (DRAPES) ×2 IMPLANT
COVER MAYO STAND STRL (DRAPES) ×2 IMPLANT
COVER PROBE W GEL 5X96 (DRAPES) ×2 IMPLANT
DECANTER SPIKE VIAL GLASS SM (MISCELLANEOUS) IMPLANT
DEVICE DUBIN W/COMP PLATE 8390 (MISCELLANEOUS) ×2 IMPLANT
DRAPE PED LAPAROTOMY (DRAPES) ×2 IMPLANT
DRSG TEGADERM 4X4.75 (GAUZE/BANDAGES/DRESSINGS) IMPLANT
ELECT COATED BLADE 2.86 ST (ELECTRODE) ×2 IMPLANT
ELECT REM PT RETURN 9FT ADLT (ELECTROSURGICAL) ×2
ELECTRODE REM PT RTRN 9FT ADLT (ELECTROSURGICAL) ×1 IMPLANT
GLOVE BIO SURGEON STRL SZ7 (GLOVE) ×4 IMPLANT
GLOVE BIOGEL PI IND STRL 7.5 (GLOVE) ×1 IMPLANT
GLOVE BIOGEL PI INDICATOR 7.5 (GLOVE) ×1
GOWN STRL REUS W/ TWL LRG LVL3 (GOWN DISPOSABLE) ×2 IMPLANT
GOWN STRL REUS W/TWL LRG LVL3 (GOWN DISPOSABLE) ×4
KIT MARKER MARGIN INK (KITS) ×2 IMPLANT
LIQUID BAND (GAUZE/BANDAGES/DRESSINGS) ×2 IMPLANT
MARKER SKIN DUAL TIP RULER LAB (MISCELLANEOUS) ×2 IMPLANT
NDL HYPO 25X1 1.5 SAFETY (NEEDLE) ×1 IMPLANT
NEEDLE HYPO 25X1 1.5 SAFETY (NEEDLE) ×2 IMPLANT
NS IRRIG 1000ML POUR BTL (IV SOLUTION) IMPLANT
PACK BASIN DAY SURGERY FS (CUSTOM PROCEDURE TRAY) ×2 IMPLANT
PENCIL BUTTON HOLSTER BLD 10FT (ELECTRODE) ×2 IMPLANT
SHEET MEDIUM DRAPE 40X70 STRL (DRAPES) IMPLANT
SLEEVE SCD COMPRESS KNEE MED (MISCELLANEOUS) ×2 IMPLANT
SPONGE GAUZE 4X4 12PLY STER LF (GAUZE/BANDAGES/DRESSINGS) IMPLANT
SPONGE LAP 4X18 X RAY DECT (DISPOSABLE) ×2 IMPLANT
STAPLER VISISTAT 35W (STAPLE) IMPLANT
STRIP CLOSURE SKIN 1/2X4 (GAUZE/BANDAGES/DRESSINGS) ×2 IMPLANT
SUT MNCRL AB 4-0 PS2 18 (SUTURE) IMPLANT
SUT MON AB 5-0 PS2 18 (SUTURE) IMPLANT
SUT SILK 2 0 SH (SUTURE) IMPLANT
SUT VIC AB 2-0 SH 27 (SUTURE) ×2
SUT VIC AB 2-0 SH 27XBRD (SUTURE) ×1 IMPLANT
SUT VIC AB 3-0 SH 27 (SUTURE) ×2
SUT VIC AB 3-0 SH 27X BRD (SUTURE) ×1 IMPLANT
SUT VIC AB 5-0 PS2 18 (SUTURE) IMPLANT
SYR CONTROL 10ML LL (SYRINGE) ×2 IMPLANT
TOWEL OR 17X24 6PK STRL BLUE (TOWEL DISPOSABLE) ×2 IMPLANT
TOWEL OR NON WOVEN STRL DISP B (DISPOSABLE) ×2 IMPLANT
TUBE CONNECTING 20X1/4 (TUBING) IMPLANT
YANKAUER SUCT BULB TIP NO VENT (SUCTIONS) IMPLANT

## 2014-10-06 NOTE — H&P (Signed)
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 US showed a 1.2 cm mass in the left UOQ. This underwent US guided biopsy with path showing Alpine involving a CSL. She reports no complaints referable to either breast. She has no FH. She has no discharge. She comes in today to discuss options.   Other Problems Davy Pique Bynum, CMA; 09/24/2014 8:22 AM) High blood pressure Hypercholesterolemia Lump In Breast Melanoma  Past Surgical History Marjean Donna, [place]; 09/24/2014 8:22 AM) Breast Biopsy Left. Cesarean Section - Multiple Gallbladder Surgery - Laparoscopic Knee Surgery Right.  Diagnostic Studies History Marjean Donna, CMA; 09/24/2014 8:22 AM) Colonoscopy 5-10 years ago Mammogram 1-3 years ago  Allergies Marjean Donna, CMA; 09/24/2014 8:24 AM) No Known Drug Allergies12/17/2015  Medication History (Sonya Bynum, CMA; 09/24/2014 8:24 AM) Temazepam (15MG  Capsule, Oral) Active. AmLODIPine Besylate (5MG  Tablet, Oral) Active. Atorvastatin Calcium (40MG  Tablet, Oral) Active. Carvedilol (6.25MG  Tablet, Oral) Active. Estradiol (0.5MG  Tablet, Oral) Active. Methocarbamol (500MG  Tablet, Oral) Active. ProAir HFA (108 (90 Base)MCG/ACT Aerosol Soln, Inhalation as needed) Active. Progesterone Micronized (100MG  Capsule, Oral) Active. Xarelto (10MG  Tablet, Oral) Active.  Social History Marjean Donna, [place]; 09/24/2014 8:22 AM) Alcohol use Occasional alcohol use. Caffeine use Coffee. No drug use Tobacco use Never smoker.  Family History Marjean Donna, CMA; 09/24/2014 8:22 AM) Alcohol Abuse Father. Arthritis Mother. Cerebrovascular Accident Father. Heart Disease Father. Hypertension Father, Mother.  Pregnancy / Birth History Marjean Donna, [place]; 09/24/2014 8:22 AM) Age at menarche 61 years. Age of menopause 51-55 Contraceptive History Intrauterine  device. Gravida 2 Maternal age 57-30 Para 2  Review of Systems Davy Pique Bynum CMA; 09/24/2014 8:22 AM) General Not Present- Appetite Loss, Chills, Fatigue, Fever, Night Sweats, Weight Gain and Weight Loss. Skin Not Present- Change in Wart/Mole, Dryness, Hives, Jaundice, New Lesions, Non-Healing Wounds, Rash and Ulcer. HEENT Not Present- Earache, Hearing Loss, Hoarseness, Nose Bleed, Oral Ulcers, Ringing in the Ears, Seasonal Allergies, Sinus Pain, Sore Throat, Visual Disturbances, Wears glasses/contact lenses and Yellow Eyes. Respiratory Not Present- Bloody sputum, Chronic Cough, Difficulty Breathing, Snoring and Wheezing. Breast Present- Breast Mass. Not Present- Breast Pain, Nipple Discharge and Skin Changes. Cardiovascular Not Present- Chest Pain, Difficulty Breathing Lying Down, Leg Cramps, Palpitations, Rapid Heart Rate, Shortness of Breath and Swelling of Extremities. Gastrointestinal Not Present- Abdominal Pain, Bloating, Bloody Stool, Change in Bowel Habits, Chronic diarrhea, Constipation, Difficulty Swallowing, Excessive gas, Gets full quickly at meals, Hemorrhoids, Indigestion, Nausea, Rectal Pain and Vomiting. Female Genitourinary Not Present- Frequency, Nocturia, Painful Urination, Pelvic Pain and Urgency. Musculoskeletal Not Present- Back Pain, Joint Pain, Joint Stiffness, Muscle Pain, Muscle Weakness and Swelling of Extremities. Neurological Not Present- Decreased Memory, Fainting, Headaches, Numbness, Seizures, Tingling, Tremor, Trouble walking and Weakness. Psychiatric Not Present- Anxiety, Bipolar, Change in Sleep Pattern, Depression, Fearful and Frequent crying. Endocrine Not Present- Cold Intolerance, Excessive Hunger, Hair Changes, Heat Intolerance, Hot flashes and New Diabetes. Hematology Not Present- Easy Bruising, Excessive bleeding, Gland problems, HIV and Persistent Infections.   Vitals (Sonya Bynum CMA; 09/24/2014 8:23 AM) 09/24/2014 8:23 AM Weight: 140 lb Height:  61in Body Surface Area: 1.65 m Body Mass Index: 26.45 kg/m Temp.: 34F(Temporal)  Pulse: 75 (Regular)  BP: 128/76 (Sitting, Left Arm, Standard)    Physical Exam Rolm Bookbinder MD; 09/24/2014 9:12 AM) General Mental Status-Alert. Orientation-Oriented X3.  Chest and Lung Exam Chest and lung exam reveals -on auscultation, normal breath sounds, no adventitious sounds and normal vocal resonance.  Breast Nipples Discharge - Bilateral - None. Breast - Left-Normal. Note: left breast hematoma present Breast - Right-Normal. Breast Lump-No Palpable Breast Mass.  Cardiovascular Cardiovascular examination reveals -normal heart sounds, regular rate and rhythm with no murmurs.    Assessment & Plan Rolm Bookbinder MD; 09/24/2014 9:15 AM) ATYPICAL LOBULAR HYPERPLASIA OF LEFT BREAST (610.8  N60.92) Impression: Left breast radioactive seed guided excisional biopsy  we discussed indication to rule out cancer associated with this core. risks discussed time off work discussed. she understands if we find cancer then would need more evaluation and more surgery likely. she also understands that with alh I will have her go to risk reduction clinic postop also if that is all that is there.

## 2014-10-06 NOTE — Op Note (Signed)
Preoperative diagnosis: Left breast mass on mammography, core biopsy consistent with atypical lobular hyperplasia Postoperative diagnosis: Same as above Procedure: Left breast radioactive seed guided excisional biopsy Surgeon: Dr. Serita Grammes Anesthesia: Gen. Estimated blood loss: Minimal Specimens: Left breast tissue marked with paint, including radioactive seed Complications: None Drains: None Sponge count was correct at completion Disposition to recovery in stable condition  Indications: This a 58 year old healthy female who underwent routine screening mammogram with a left breast mass noted. This underwent a core biopsy that was consistent with atypical lobular hyperplasia. We discussed excision with radioactive seed guidance. She had her seed place prior to beginning. These mammograms were available in the operating room.  Procedure: After informed consent was obtained she was taken to the operating room. She was given cefazolin. Sequential compression devices were on her legs. She was placed under general anesthesia with an LMA. Her left breast was prepped and draped in the standard sterile surgical fashion. Surgical timeout was then performed.  I infiltrated Marcaine in the upper outer quadrant. I located the seed with the neoprobe. I made a curvilinear incision overlying this. I then used the neoprobe to guide the excision of the seed in the surrounding tissue. There was no radioactivity remaining in the breast once this was removed. I marked the tissue with paint. I confirmed that the seed was in the specimen with the neoprobe. A mammogram was taken confirming removal of the clip as well as the radioactive seed. This was confirmed by radiology. I then obtained hemostasis. I closed this with 2-0 Vicryl, 3-0 Vicryl, and 4-0 Monocryl. A loop was then placed over the incision. She tolerated this well was extubated and transferred to recovery stable.

## 2014-10-06 NOTE — Anesthesia Preprocedure Evaluation (Addendum)
Anesthesia Evaluation  Patient identified by MRN, date of birth, ID band Patient awake    Reviewed: Allergy & Precautions, H&P , NPO status , Patient's Chart, lab work & pertinent test results, reviewed documented beta blocker date and time   Airway Mallampati: II  TM Distance: >3 FB Neck ROM: Full    Dental  (+) Teeth Intact, Dental Advisory Given   Pulmonary neg pulmonary ROS,  breath sounds clear to auscultation        Cardiovascular hypertension, Pt. on medications and Pt. on home beta blockers Rhythm:Regular Rate:Normal     Neuro/Psych negative neurological ROS  negative psych ROS   GI/Hepatic negative GI ROS, Neg liver ROS,   Endo/Other  negative endocrine ROS  Renal/GU negative Renal ROS     Musculoskeletal  (+) Arthritis -,   Abdominal   Peds  Hematology negative hematology ROS (+)   Anesthesia Other Findings   Reproductive/Obstetrics                            Anesthesia Physical Anesthesia Plan  ASA: II  Anesthesia Plan: General   Post-op Pain Management:    Induction: Intravenous  Airway Management Planned: LMA  Additional Equipment:   Intra-op Plan:   Post-operative Plan:   Informed Consent: I have reviewed the patients History and Physical, chart, labs and discussed the procedure including the risks, benefits and alternatives for the proposed anesthesia with the patient or authorized representative who has indicated his/her understanding and acceptance.   Dental advisory given  Plan Discussed with: CRNA  Anesthesia Plan Comments:         Anesthesia Quick Evaluation

## 2014-10-06 NOTE — Discharge Instructions (Signed)
[facility] °Office Phone Number [phone] ° °BREAST BIOPSY/ PARTIAL MASTECTOMY: POST OP INSTRUCTIONS ° °Always review your discharge instruction sheet given to you by the facility where your surgery was performed. ° °IF YOU HAVE DISABILITY OR FAMILY LEAVE FORMS, YOU MUST BRING THEM TO THE OFFICE FOR PROCESSING.  DO NOT GIVE THEM TO YOUR DOCTOR. ° °1. A prescription for pain medication may be given to you upon discharge.  Take your pain medication as prescribed, if needed.  If narcotic pain medicine is not needed, then you may take acetaminophen (Tylenol), naprosyn (Alleve) or ibuprofen (Advil) as needed. °2. Take your usually prescribed medications unless otherwise directed °3. If you need a refill on your pain medication, please contact your pharmacy.  They will contact our office to request authorization.  Prescriptions will not be filled after 5pm or on week-ends. °4. You should eat very light the first 24 hours after surgery, such as soup, crackers, pudding, etc.  Resume your normal diet the day after surgery. °5. Most patients will experience some swelling and bruising in the breast.  Ice packs and a good support bra will help.  Wear the breast binder provided or a sports bra for 72 hours day and night.  After that wear a sports bra during the day until you return to the office. Swelling and bruising can take several days to resolve.  °6. It is common to experience some constipation if taking pain medication after surgery.  Increasing fluid intake and taking a stool softener will usually help or prevent this problem from occurring.  A mild laxative (Milk of Magnesia or Miralax) should be taken according to package directions if there are no bowel movements after 48 hours. °7. Unless discharge instructions indicate otherwise, you may remove your bandages 48 hours after surgery and you may shower at that time.  You may have steri-strips (small skin tapes) in place directly over the incision.   These strips should be left on the skin for 7-10 days and will come off on their own.  If your surgeon used skin glue on the incision, you may shower in 24 hours.  The glue will flake off over the next 2-3 weeks.  Any sutures or staples will be removed at the office during your follow-up visit. °8. ACTIVITIES:  You may resume regular daily activities (gradually increasing) beginning the next day.  Wearing a good support bra or sports bra minimizes pain and swelling.  You may have sexual intercourse when it is comfortable. °a. You may drive when you no longer are taking prescription pain medication, you can comfortably wear a seatbelt, and you can safely maneuver your car and apply brakes. °b. RETURN TO WORK:  ______________________________________________________________________________________ °9. You should see your doctor in the office for a follow-up appointment approximately two weeks after your surgery.  Your doctor’s nurse will typically make your follow-up appointment when she calls you with your pathology report.  Expect your pathology report 3-4 business days after your surgery.  You may call to check if you do not hear from us after three days. °10. OTHER INSTRUCTIONS: _______________________________________________________________________________________________ _____________________________________________________________________________________________________________________________________ °_____________________________________________________________________________________________________________________________________ °_____________________________________________________________________________________________________________________________________ ° °WHEN TO CALL DR WAKEFIELD: °1. Fever over 101.0 °2. Nausea and/or vomiting. °3. Extreme swelling or bruising. °4. Continued bleeding from incision. °5. Increased pain, redness, or drainage from the incision. ° °The clinic staff is available to  answer your questions during regular business hours.  Please don’t hesitate to call and ask to speak to one of the nurses for   clinical concerns.  If you have a medical emergency, go to the nearest emergency room or call 911.  A surgeon from [facility] is always on call at the hospital. ° °For further questions, please visit [email or web address] ° °Post Anesthesia Home Care Instructions ° °Activity: °Get plenty of rest for the remainder of the day. A responsible adult should stay with you for 24 hours following the procedure.  °For the next 24 hours, DO NOT: °-Drive a car °-Operate machinery °-Drink alcoholic beverages °-Take any medication unless instructed by your physician °-Make any legal decisions or sign important papers. ° °Meals: °Start with liquid foods such as gelatin or soup. Progress to regular foods as tolerated. Avoid greasy, spicy, heavy foods. If nausea and/or vomiting occur, drink only clear liquids until the nausea and/or vomiting subsides. Call your physician if vomiting continues. ° °Special Instructions/Symptoms: °Your throat may feel dry or sore from the anesthesia or the breathing tube placed in your throat during surgery. If this causes discomfort, gargle with warm salt water. The discomfort should disappear within 24 hours. ° °

## 2014-10-06 NOTE — Transfer of Care (Signed)
Immediate Anesthesia Transfer of Care Note  Patient: Colleen Shannon  Procedure(s) Performed: Procedure(s): RADIOACTIVE SEED GUIDED EXCISIONAL BREAST BIOPSY (N/A)  Patient Location: PACU  Anesthesia Type:General  Level of Consciousness: awake  Airway & Oxygen Therapy: Patient Spontanous Breathing and Patient connected to face mask oxygen  Post-op Assessment: Report given to PACU RN and Post -op Vital signs reviewed and stable  Post vital signs: Reviewed and stable  Complications: No apparent anesthesia complications

## 2014-10-06 NOTE — Anesthesia Postprocedure Evaluation (Signed)
  Anesthesia Post-op Note  Patient: Colleen Shannon  Procedure(s) Performed: Procedure(s): RADIOACTIVE SEED GUIDED EXCISIONAL BREAST BIOPSY (N/A)  Patient Location: PACU  Anesthesia Type:General  Level of Consciousness: awake, alert , oriented and patient cooperative  Airway and Oxygen Therapy: Patient Spontanous Breathing  Post-op Pain: mild  Post-op Assessment: Post-op Vital signs reviewed, Patient's Cardiovascular Status Stable, Respiratory Function Stable, Patent Airway, No signs of Nausea or vomiting and Pain level controlled  Post-op Vital Signs: stable  Last Vitals:  Filed Vitals:   10/06/14 1245  BP: 126/72  Pulse: 68  Temp:   Resp: 11    Complications: No apparent anesthesia complications

## 2014-10-06 NOTE — Interval H&P Note (Signed)
History and Physical Interval Note:  10/06/2014 11:00 AM  Colleen Shannon  has presented today for surgery, with the diagnosis of breast mass  The various methods of treatment have been discussed with the patient and family. After consideration of risks, benefits and other options for treatment, the patient has consented to  Procedure(s): RADIOACTIVE SEED GUIDED EXCISIONAL BREAST BIOPSY (N/A) as a surgical intervention .  The patient's history has been reviewed, patient examined, no change in status, stable for surgery.  I have reviewed the patient's chart and labs.  Questions were answered to the patient's satisfaction.     Deshaun Schou

## 2014-10-06 NOTE — Anesthesia Procedure Notes (Signed)
Procedure Name: LMA Insertion Date/Time: 10/06/2014 11:24 AM Performed by: Lieutenant Diego Pre-anesthesia Checklist: Patient identified, Emergency Drugs available, Suction available and Patient being monitored Patient Re-evaluated:Patient Re-evaluated prior to inductionOxygen Delivery Method: Circle System Utilized Preoxygenation: Pre-oxygenation with 100% oxygen Intubation Type: IV induction Ventilation: Mask ventilation without difficulty LMA: LMA inserted LMA Size: 4.0 Number of attempts: 1 Airway Equipment and Method: bite block Placement Confirmation: positive ETCO2 and breath sounds checked- equal and bilateral Tube secured with: Tape Dental Injury: Teeth and Oropharynx as per pre-operative assessment

## 2014-10-07 ENCOUNTER — Encounter (HOSPITAL_BASED_OUTPATIENT_CLINIC_OR_DEPARTMENT_OTHER): Payer: Self-pay | Admitting: General Surgery

## 2014-10-27 ENCOUNTER — Other Ambulatory Visit (INDEPENDENT_AMBULATORY_CARE_PROVIDER_SITE_OTHER): Payer: Self-pay | Admitting: General Surgery

## 2014-11-03 ENCOUNTER — Telehealth: Payer: Self-pay | Admitting: *Deleted

## 2014-11-03 NOTE — Telephone Encounter (Signed)
Received referral from [employer].  Called pt and confirmed 11/18/14 high risk appt.  Emailed Engineer, civil (consulting) at [employer] to make her aware.  Added to spreadsheet.  Placed a copy of the notes in Dr. Geralyn Flash box and took one to HIM to scan.

## 2014-11-18 ENCOUNTER — Ambulatory Visit (HOSPITAL_BASED_OUTPATIENT_CLINIC_OR_DEPARTMENT_OTHER): Payer: BLUE CROSS/BLUE SHIELD | Admitting: Hematology and Oncology

## 2014-11-18 ENCOUNTER — Encounter: Payer: Self-pay | Admitting: Hematology and Oncology

## 2014-11-18 ENCOUNTER — Ambulatory Visit: Payer: BLUE CROSS/BLUE SHIELD

## 2014-11-18 ENCOUNTER — Telehealth: Payer: Self-pay | Admitting: Hematology and Oncology

## 2014-11-18 VITALS — BP 133/70 | HR 78 | Temp 98.5°F | Resp 18 | Ht 61.0 in | Wt 146.0 lb

## 2014-11-18 DIAGNOSIS — D0502 Lobular carcinoma in situ of left breast: Secondary | ICD-10-CM | POA: Insufficient documentation

## 2014-11-18 DIAGNOSIS — N951 Menopausal and female climacteric states: Secondary | ICD-10-CM

## 2014-11-18 MED ORDER — TAMOXIFEN CITRATE 20 MG PO TABS: 20.0000 mg | ORAL_TABLET | Freq: Every day | ORAL | Status: DC

## 2014-11-18 NOTE — Assessment & Plan Note (Signed)
Left breast LCIS status post lumpectomy 10/06/2014  Pathology counseling: I discussed with her in detail the significance of LCIS and the difference between LCIS and invasive lobular cancer. I described LCIS is a risk factor for breast cancer and not a precancerous condition. I reviewed the risk of breast cancer based on clinical trials to be about 1% per year of life expectancy year. With her age of 71, she has about a 25% lifetime risk of breast cancer.  Recommendation: Tamoxifen for breast cancer prevention 20 mg daily for 5 years I discussed the results from NSABP P1 trial which showed that tamoxifen decrease the risk of breast cancer by 50% in patients with LCIS.  Tamoxifen counseling:We discussed the risks and benefits of tamoxifen. These include but not limited to insomnia, hot flashes, mood changes, vaginal dryness, and weight gain. Although rare, serious side effects including endometrial cancer, risk of blood clots were also discussed. We strongly believe that the benefits far outweigh the risks. Patient understands these risks and consented to starting treatment. Planned treatment duration is 5 years.  Hot flashes:Patient already has hot flashes and has been on estrogen with progesterone for the past 7 years and is tapering off the pills at this time. I discussed with her hot flashes management non-pharmacological measures including yoga and exercise as well as pharmacological measures that include Neurontin and Effexor.patient will wait to see how she feels after taking tamoxifen and coming off estrogen progesterone pills.  Return to clinic in one month to assess tolerability to treatment. If she tolerates it well we can see her once every 6 months thereafter.

## 2014-11-18 NOTE — Progress Notes (Signed)
[facility] CONSULT NOTE  Patient Care Team: Candace Wyline Copas, MD as PCP - General (Family Medicine)  CHIEF COMPLAINTS/PURPOSE OF CONSULTATION:  LCIS left breast  HISTORY OF PRESENTING ILLNESS:  Colleen Shannon 59 y.o. female is here because of recent diagnosis of left breast LCIS. She had routine screening mammograms that over the years have shown an abnormality in the left breast which was observed up until recently she underwent ultrasound and a biopsy that revealed LCIS. She was then seen by Dr. Donne Hazel who performed a lumpectomy which confirmed that she had lobular carcinoma in situ. She has healed very well from the surgery and is being sent to [place] for discussion regarding breast cancer risk reduction strategies. She works as an Haematologist at [employer].  I reviewed her records extensively and collaborated the history with the patient.  In terms of breast cancer risk profile:  She menarched at early age of 35 and went to menopause at age 9 She had 2 pregnancy, her first child was born at age 24  She was exposed to hormone replacement therapy for the past 7 years and she is tapering it now.  She has no family history of Breast/GYN/GI cancer  MEDICAL HISTORY:  Past Medical History  Diagnosis Date  . Endometrial polyp   . Hypertension 05/22/12    ECHO-EF >55% trace tricupsid regurgitation. There is mild mitral regurgitation.   . Elevated cholesterol 05/22/12    Nuc stress test-Normal. The patiet had a markedly positive GXT for ischemia. post stress EF 72%  . Arthritis   . Cancer     hx of skin cancer     SURGICAL HISTORY: Past Surgical History  Procedure Laterality Date  . Cesarean section      X 2  . Tubal ligation    . Dilation and curettage of uterus    . Hysteroscopy    . Cholecystectomy    . Abdominal surgery      Abdominoplasty  . Foot surgery    . Knee surgery      arthroscopic  . Total knee arthroplasty Right 05/11/2014    Procedure:  RIGHT TOTAL KNEE ARTHROPLASTY;  Surgeon: [name], MD;  Location: [facility];  Service: Orthopedics;  Laterality: Right;  . Colonscopy    . Knee closed reduction Right 07/20/2014    Procedure: RIGHT KNEE CLOSED MANIPULATION;  Surgeon: [name], MD;  Location: [facility];  Service: Orthopedics;  Laterality: Right;  . Radioactive seed guided excisional breast biopsy N/A 10/06/2014    Procedure: RADIOACTIVE SEED GUIDED EXCISIONAL BREAST BIOPSY;  Surgeon: Rolm Bookbinder, MD;  Location: [facility];  Service: General;  Laterality: N/A;    SOCIAL HISTORY: History   Social History  . Marital Status: Married    Spouse Name: N/A  . Number of Children: N/A  . Years of Education: N/A   Occupational History  . Not on file.   Social History Main Topics  . Smoking status: Never Smoker   . Smokeless tobacco: Never Used  . Alcohol Use: 0.5 oz/week    1 Standard drinks or equivalent per week     Comment: occasional   . Drug Use: No  . Sexual Activity: Yes    Birth Control/ Protection: Surgical, Post-menopausal   Other Topics Concern  . Not on file   Social History Narrative    FAMILY HISTORY: Family History  Problem Relation Age of Onset  . Heart disease Father   .  Hypertension Paternal Grandfather   . Stroke Paternal Grandfather   . Aneurysm Mother   . Hyperlipidemia Mother   . Heart disease Maternal Grandfather   . Cancer - Other Maternal Grandfather     lung  . Hypertension Brother   . Cancer - Other Paternal Grandmother     uterine    ALLERGIES:  has No Known Allergies.  MEDICATIONS:  Current Outpatient Prescriptions  Medication Sig Dispense Refill  . acetaminophen (TYLENOL) 325 MG tablet Take 650 mg by mouth once as needed for mild pain or headache.    Marland Kitchen amLODipine (NORVASC) 5 MG tablet TAKE 1 TABLET (5 MG TOTAL) BY MOUTH AT BEDTIME. 90 tablet 3  . aspirin EC 81 MG tablet Take 81 mg by mouth every morning.    Marland Kitchen atorvastatin (LIPITOR) 40 MG tablet  TAKE 1 TABLET BY MOUTH EVERY DAY 90 tablet 2  . Calcium Carbonate-Vit D-Min (CALCIUM 1200 PO) Take 1 tablet by mouth every morning.    . carvedilol (COREG) 6.25 MG tablet Take 6.25 mg by mouth 2 (two) times daily with a meal.    . cholecalciferol (VITAMIN D) 1000 UNITS tablet Take 1,000 Units by mouth every morning.    Marland Kitchen estradiol (ESTRACE) 0.5 MG tablet Take 0.5 mg by mouth every morning.    Marland Kitchen PROAIR HFA 108 (90 BASE) MCG/ACT inhaler   0  . progesterone (PROMETRIUM) 100 MG capsule Take 100 mg by mouth See admin instructions. Nightly at bedtime just the first 12 days of the month.    . tamoxifen (NOLVADEX) 20 MG tablet Take 1 tablet (20 mg total) by mouth daily. 90 tablet 3   No current facility-administered medications for this visit.    REVIEW OF SYSTEMS:   Constitutional: Denies fevers, chills or abnormal night sweats Eyes: Denies blurriness of vision, double vision or watery eyes Ears, nose, mouth, throat, and face: Denies mucositis or sore throat Respiratory: Denies cough, dyspnea or wheezes Cardiovascular: Denies palpitation, chest discomfort or lower extremity swelling Gastrointestinal:  Denies nausea, heartburn or change in bowel habits Skin: Denies abnormal skin rashes Lymphatics: Denies new lymphadenopathy or easy bruising Neurological:Denies numbness, tingling or new weaknesses Behavioral/Psych: Mood is stable, no new changes  Breast:  Denies any palpable lumps or discharge All other systems were reviewed with the patient and are negative.  PHYSICAL EXAMINATION: ECOG PERFORMANCE STATUS: 0 - Asymptomatic  Filed Vitals:   11/18/14 1545  BP: 133/70  Pulse: 78  Temp: 98.5 F (36.9 C)  Resp: 18   Filed Weights   11/18/14 1545  Weight: 146 lb (66.225 kg)    GENERAL:alert, no distress and comfortable SKIN: skin color, texture, turgor are normal, no rashes or significant lesions EYES: normal, conjunctiva are pink and non-injected, sclera clear OROPHARYNX:no exudate, no  erythema and lips, buccal mucosa, and tongue normal  NECK: supple, thyroid normal size, non-tender, without nodularity LYMPH:  no palpable lymphadenopathy in the cervical, axillary or inguinal LUNGS: clear to auscultation and percussion with normal breathing effort HEART: regular rate & rhythm and no murmurs and no lower extremity edema ABDOMEN:abdomen soft, non-tender and normal bowel sounds Musculoskeletal:no cyanosis of digits and no clubbing  PSYCH: alert & oriented x 3 with fluent speech NEURO: no focal motor/sensory deficits  LABORATORY DATA:  I have reviewed the data as listed Lab Results  Component Value Date   WBC 14.0* 09/30/2014   HGB 14.0 10/06/2014   HCT 40.0 09/30/2014   MCV 90.3 09/30/2014   PLT 333 09/30/2014  Lab Results  Component Value Date   NA 141 09/30/2014   K 4.3 09/30/2014   CL 106 09/30/2014   CO2 27 09/30/2014    ASSESSMENT AND PLAN:  Neoplasm of left breast, primary tumor staging category Tis: lobular carcinoma in situ (LCIS) Left breast LCIS status post lumpectomy 10/06/2014  Pathology counseling: I discussed with her in detail the significance of LCIS and the difference between LCIS and invasive lobular cancer. I described LCIS is a risk factor for breast cancer and not a precancerous condition. I reviewed the risk of breast cancer based on clinical trials to be about 1% per year of life expectancy year. With her age of 76, she has about a 25% lifetime risk of breast cancer.  Recommendation: Tamoxifen for breast cancer prevention 20 mg daily for 5 years I discussed the results from NSABP P1 trial which showed that tamoxifen decrease the risk of breast cancer by 50% in patients with LCIS.  Tamoxifen counseling:We discussed the risks and benefits of tamoxifen. These include but not limited to insomnia, hot flashes, mood changes, vaginal dryness, and weight gain. Although rare, serious side effects including endometrial cancer, risk of blood clots were  also discussed. We strongly believe that the benefits far outweigh the risks. Patient understands these risks and consented to starting treatment. Planned treatment duration is 5 years.  Hot flashes:Patient already has hot flashes and has been on estrogen with progesterone for the past 7 years and is tapering off the pills at this time. I discussed with her hot flashes management non-pharmacological measures including yoga and exercise as well as pharmacological measures that include Neurontin and Effexor.patient will wait to see how she feels after taking tamoxifen and coming off estrogen progesterone pills.  Return to clinic in one month to assess tolerability to treatment. If she tolerates it well we can see her once every 6 months thereafter.    All questions were answered. The patient knows to call the clinic with any problems, questions or concerns.    Rulon Eisenmenger, MD 4:44 PM

## 2014-11-18 NOTE — Telephone Encounter (Signed)
gave pt avs report and appts for march

## 2014-12-23 ENCOUNTER — Telehealth: Payer: Self-pay | Admitting: Hematology and Oncology

## 2014-12-23 NOTE — Telephone Encounter (Signed)
Returned patients call as she wishes to reschedule her appoint,emts

## 2014-12-24 ENCOUNTER — Ambulatory Visit: Payer: BLUE CROSS/BLUE SHIELD | Admitting: Hematology and Oncology

## 2014-12-31 ENCOUNTER — Ambulatory Visit (HOSPITAL_BASED_OUTPATIENT_CLINIC_OR_DEPARTMENT_OTHER): Payer: BLUE CROSS/BLUE SHIELD | Admitting: Hematology and Oncology

## 2014-12-31 VITALS — BP 133/67 | HR 78 | Temp 98.8°F | Resp 18 | Ht 61.0 in | Wt 145.9 lb

## 2014-12-31 DIAGNOSIS — D0502 Lobular carcinoma in situ of left breast: Secondary | ICD-10-CM | POA: Diagnosis not present

## 2014-12-31 DIAGNOSIS — N951 Menopausal and female climacteric states: Secondary | ICD-10-CM | POA: Diagnosis not present

## 2014-12-31 NOTE — Progress Notes (Signed)
Patient Care Team: Carol Ada, MD as PCP - General (Family Medicine)  DIAGNOSIS: LCIS left breast Current treatment: Tamoxifen 20 mg daily starting February 2016  CHIEF COMPLIANT: follow-up on tamoxifen therapy  INTERVAL HISTORY: Colleen Shannon is a 59 year old with above-mentioned history of left breast LCIS currently on tamoxifen. She is tolerating it extremely well without any major problems other than hot flashes. She gets 6-8 hot flashes per day. It wakes her up once or twice a day. But she does not think it is interfering with her quality of life. She does not want any medications to help prevent it.she complains of itching around the left nipple. There does not appear to be any crusting or any other changes to it.  REVIEW OF SYSTEMS:   Constitutional: Denies fevers, chills or abnormal weight loss Eyes: Denies blurriness of vision Ears, nose, mouth, throat, and face: Denies mucositis or sore throat Respiratory: Denies cough, dyspnea or wheezes Cardiovascular: Denies palpitation, chest discomfort or lower extremity swelling Gastrointestinal:  Denies nausea, heartburn or change in bowel habits Skin: Denies abnormal skin rashes Lymphatics: Denies new lymphadenopathy or easy bruising Neurological:Denies numbness, tingling or new weaknesses Behavioral/Psych: Mood is stable, no new changes  Breast: itching around the left nipple All other systems were reviewed with the patient and are negative.  I have reviewed the past medical history, past surgical history, social history and family history with the patient and they are unchanged from previous note.  ALLERGIES:  has No Known Allergies.  MEDICATIONS:  Current Outpatient Prescriptions  Medication Sig Dispense Refill  . acetaminophen (TYLENOL) 325 MG tablet Take 650 mg by mouth once as needed for mild pain or headache.    Marland Kitchen amLODipine (NORVASC) 5 MG tablet TAKE 1 TABLET (5 MG TOTAL) BY MOUTH AT BEDTIME. 90 tablet 3  . aspirin EC 81  MG tablet Take 81 mg by mouth every morning.    Marland Kitchen atorvastatin (LIPITOR) 40 MG tablet TAKE 1 TABLET BY MOUTH EVERY DAY 90 tablet 2  . Calcium Carbonate-Vit D-Min (CALCIUM 1200 PO) Take 1 tablet by mouth every morning.    . carvedilol (COREG) 6.25 MG tablet Take 6.25 mg by mouth 2 (two) times daily with a meal.    . cholecalciferol (VITAMIN D) 1000 UNITS tablet Take 1,000 Units by mouth every morning.    Marland Kitchen PROAIR HFA 108 (90 BASE) MCG/ACT inhaler   0  . tamoxifen (NOLVADEX) 20 MG tablet Take 1 tablet (20 mg total) by mouth daily. 90 tablet 3   No current facility-administered medications for this visit.    PHYSICAL EXAMINATION: ECOG PERFORMANCE STATUS: 1 - Symptomatic but completely ambulatory  Filed Vitals:   12/31/14 1509  BP: 133/67  Pulse: 78  Temp: 98.8 F (37.1 C)  Resp: 18   Filed Weights   12/31/14 1509  Weight: 145 lb 14.4 oz (66.18 kg)    GENERAL:alert, no distress and comfortable SKIN: skin color, texture, turgor are normal, no rashes or significant lesions EYES: normal, Conjunctiva are pink and non-injected, sclera clear OROPHARYNX:no exudate, no erythema and lips, buccal mucosa, and tongue normal  NECK: supple, thyroid normal size, non-tender, without nodularity LYMPH:  no palpable lymphadenopathy in the cervical, axillary or inguinal LUNGS: clear to auscultation and percussion with normal breathing effort HEART: regular rate & rhythm and no murmurs and no lower extremity edema ABDOMEN:abdomen soft, non-tender and normal bowel sounds Musculoskeletal:no cyanosis of digits and no clubbing  NEURO: alert & oriented x 3 with fluent speech, no focal  motor/sensory deficits  LABORATORY DATA:  I have reviewed the data as listed   Chemistry      Component Value Date/Time   NA 141 09/30/2014 1200   K 4.3 09/30/2014 1200   CL 106 09/30/2014 1200   CO2 27 09/30/2014 1200   BUN 14 09/30/2014 1200   CREATININE 0.64 09/30/2014 1200      Component Value Date/Time    CALCIUM 9.5 09/30/2014 1200   ALKPHOS 54 04/30/2014 1055   AST 37 04/30/2014 1055   ALT 29 04/30/2014 1055   BILITOT 0.5 04/30/2014 1055       Lab Results  Component Value Date   WBC 14.0* 09/30/2014   HGB 14.0 10/06/2014   HCT 40.0 09/30/2014   MCV 90.3 09/30/2014   PLT 333 09/30/2014   NEUTROABS 2.9 09/30/2014   ASSESSMENT & PLAN:  Neoplasm of left breast, primary tumor staging category Tis: lobular carcinoma in situ (LCIS) Left breast LCIS status post lumpectomy 10/06/2014;  Started tamoxifen for breast cancer risk reduction 20 mg daily 11/18/2014 , plan is to treat for 5 years   Tamoxifen toxicities: 1.  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 It wakes her up once or twice at night. 2.  Denies any myalgias.   Itching around the nipple left breast: instructed her to apply moisturizing lotion and or Benadryl or cortisone cream.   Return to clinic in 6 months for follow-up   No orders of the defined types were placed in this encounter.   The patient has a good understanding of the overall plan. she agrees with it. She will call with any problems that may develop before her next visit here.   Rulon Eisenmenger, MD

## 2014-12-31 NOTE — Assessment & Plan Note (Signed)
Left breast LCIS status post lumpectomy 10/06/2014;  Started tamoxifen for breast cancer risk reduction 20 mg daily 11/18/2014 , plan is to treat for 5 years   Tamoxifen toxicities: 1.  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 It wakes her up once or twice at night. 2.  Denies any myalgias.   Itching around the nipple left breast: instructed her to apply moisturizing lotion and or Benadryl or cortisone cream.   Return to clinic in 6 months for follow-up

## 2015-01-01 ENCOUNTER — Telehealth: Payer: Self-pay | Admitting: Hematology and Oncology

## 2015-01-01 NOTE — Telephone Encounter (Signed)
Spoke with patient and she is aware of her 06/2015 appoinement

## 2015-04-01 ENCOUNTER — Other Ambulatory Visit: Payer: Self-pay | Admitting: Obstetrics and Gynecology

## 2015-04-02 LAB — CYTOLOGY - PAP

## 2015-06-01 ENCOUNTER — Other Ambulatory Visit: Payer: Self-pay | Admitting: Cardiovascular Disease

## 2015-06-01 NOTE — Telephone Encounter (Signed)
REFILL 

## 2015-06-29 ENCOUNTER — Telehealth: Payer: Self-pay | Admitting: Hematology and Oncology

## 2015-06-29 NOTE — Telephone Encounter (Signed)
Patient called in to reschedule her appointment °

## 2015-07-01 ENCOUNTER — Ambulatory Visit: Payer: BLUE CROSS/BLUE SHIELD | Admitting: Hematology and Oncology

## 2015-07-09 ENCOUNTER — Encounter: Payer: Self-pay | Admitting: Hematology and Oncology

## 2015-07-09 ENCOUNTER — Ambulatory Visit (HOSPITAL_BASED_OUTPATIENT_CLINIC_OR_DEPARTMENT_OTHER): Payer: BLUE CROSS/BLUE SHIELD | Admitting: Hematology and Oncology

## 2015-07-09 VITALS — BP 124/62 | HR 76 | Temp 98.8°F | Resp 18 | Ht 61.0 in | Wt 147.1 lb

## 2015-07-09 DIAGNOSIS — D0502 Lobular carcinoma in situ of left breast: Secondary | ICD-10-CM

## 2015-07-09 DIAGNOSIS — N951 Menopausal and female climacteric states: Secondary | ICD-10-CM

## 2015-07-09 DIAGNOSIS — Z79810 Long term (current) use of selective estrogen receptor modulators (SERMs): Secondary | ICD-10-CM

## 2015-07-09 DIAGNOSIS — Z170 Estrogen receptor positive status [ER+]: Secondary | ICD-10-CM

## 2015-07-09 NOTE — Progress Notes (Signed)
Patient Care Team: Carol Ada, MD as PCP - General (Family Medicine)  DIAGNOSIS: Left breast LCIS. Current treatment: Tamoxifen 20 mg daily  CHIEF COMPLIANT: Hot flashes due to tamoxifen  INTERVAL HISTORY: Colleen Shannon is a 59 year old with above-mentioned history of left breast LCIS status post lumpectomy December 2015. She was started on risk reduction therapy with tamoxifen. She is tolerating it moderately well. Her major complaint is hot flashes 6-8 times per day. 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 She is also excited that is finally going to be colder. She was started on clonidine but it has not made a huge impact. Denies any lumps or nodules in the breasts. She has a mild H her discomfort underneath the left breast/pectoral muscle going into her arm. It is very intermittent.  REVIEW OF SYSTEMS:   Constitutional: Denies fevers, chills or abnormal weight loss Eyes: Denies blurriness of vision Ears, nose, mouth, throat, and face: Denies mucositis or sore throat Respiratory: Denies cough, dyspnea or wheezes Cardiovascular: Denies palpitation, chest discomfort or lower extremity swelling Gastrointestinal:  Denies nausea, heartburn or change in bowel habits Skin: Itching of the skin underneath the left breast and left hand Lymphatics: Denies new lymphadenopathy or easy bruising Neurological:Denies numbness, tingling or new weaknesses Behavioral/Psych: Mood is stable, no new changes  Breast:  denies any pain or lumps or nodules in either breasts All other systems were reviewed with the patient and are negative.  I have reviewed the past medical history, past surgical history, social history and family history with the patient and they are unchanged from previous note.  ALLERGIES:  has No Known Allergies.  MEDICATIONS:  Current Outpatient Prescriptions  Medication Sig Dispense Refill  . acetaminophen (TYLENOL) 325 MG tablet Take 650 mg by mouth  once as needed for mild pain or headache.    Marland Kitchen amLODipine (NORVASC) 5 MG tablet TAKE 1 TABLET (5 MG TOTAL) BY MOUTH AT BEDTIME. 90 tablet 3  . aspirin EC 81 MG tablet Take 81 mg by mouth every morning.    Marland Kitchen atorvastatin (LIPITOR) 40 MG tablet Take 1 tablet (40 mg total) by mouth daily. NEED OV. 90 tablet 0  . Calcium Carbonate-Vit D-Min (CALCIUM 1200 PO) Take 1 tablet by mouth every morning.    . carvedilol (COREG) 6.25 MG tablet Take 6.25 mg by mouth 2 (two) times daily with a meal.    . cholecalciferol (VITAMIN D) 1000 UNITS tablet Take 1,000 Units by mouth every morning.    Marland Kitchen PROAIR HFA 108 (90 BASE) MCG/ACT inhaler   0  . tamoxifen (NOLVADEX) 20 MG tablet Take 1 tablet (20 mg total) by mouth daily. 90 tablet 3   No current facility-administered medications for this visit.    PHYSICAL EXAMINATION: ECOG PERFORMANCE STATUS: 1 - Symptomatic but completely ambulatory  Filed Vitals:   07/09/15 0957  BP: 124/62  Pulse: 76  Temp: 98.8 F (37.1 C)  Resp: 18   Filed Weights   07/09/15 0957  Weight: 147 lb 1.6 oz (66.724 kg)    GENERAL:alert, no distress and comfortable SKIN: skin color, texture, turgor are normal, no rashes or significant lesions EYES: normal, Conjunctiva are pink and non-injected, sclera clear OROPHARYNX:no exudate, no erythema and lips, buccal mucosa, and tongue normal  NECK: supple, thyroid normal size, non-tender, without nodularity LYMPH:  no palpable lymphadenopathy in the cervical, axillary or inguinal LUNGS: clear to auscultation and percussion with normal breathing effort HEART: regular rate & rhythm  and no murmurs and no lower extremity edema ABDOMEN:abdomen soft, non-tender and normal bowel sounds Musculoskeletal:no cyanosis of digits and no clubbing  NEURO: alert & oriented x 3 with fluent speech, no focal motor/sensory deficits BREAST: No palpable masses or nodules in either right or left breasts. No palpable axillary supraclavicular or infraclavicular  adenopathy no breast tenderness or nipple discharge. (exam performed in the presence of a chaperone)  LABORATORY DATA:  I have reviewed the data as listed   Chemistry      Component Value Date/Time   NA 141 09/30/2014 1200   K 4.3 09/30/2014 1200   CL 106 09/30/2014 1200   CO2 27 09/30/2014 1200   BUN 14 09/30/2014 1200   CREATININE 0.64 09/30/2014 1200      Component Value Date/Time   CALCIUM 9.5 09/30/2014 1200   ALKPHOS 54 04/30/2014 1055   AST 37 04/30/2014 1055   ALT 29 04/30/2014 1055   BILITOT 0.5 04/30/2014 1055       Lab Results  Component Value Date   WBC 14.0* 09/30/2014   HGB 14.0 10/06/2014   HCT 40.0 09/30/2014   MCV 90.3 09/30/2014   PLT 333 09/30/2014   NEUTROABS 2.9 09/30/2014    ASSESSMENT & PLAN:  Neoplasm of left breast, primary tumor staging category Tis: lobular carcinoma in situ (LCIS) Left breast LCIS status post lumpectomy 10/06/2014; Started tamoxifen for breast cancer risk reduction 20 mg daily 11/18/2014 , plan is to treat for 5 years  Tamoxifen toxicities: 1. Hot flashes 6-8 per day : patient does not think they're significant because she works in the [employer] where it is extremely cold and it does not seem to bother her too much. It wakes her up once or twice at night. She was started on clonidine by her gynecologist. She does not think it is been helpful. She plans to stop it. 2. Denies any myalgias.  Itching around the nipple left breast: instructed her to apply moisturizing lotion and or Benadryl or cortisone cream.  Return to clinic in 6 months for follow-up and after that we will see her once a year   No orders of the defined types were placed in this encounter.   The patient has a good understanding of the overall plan. she agrees with it. she will call with any problems that may develop before the next visit here.   Rulon Eisenmenger, MD

## 2015-07-09 NOTE — Assessment & Plan Note (Signed)
Left breast LCIS status post lumpectomy 10/06/2014;  Started tamoxifen for breast cancer risk reduction 20 mg daily 11/18/2014 , plan is to treat for 5 years   Tamoxifen toxicities: 1.  Hot flashes 6-8 per day : patient does not think they're significant because she works in the [place] where it is extremely cold and it does not seem to bother her too much. It wakes her up once or twice at night. 2.  Denies any myalgias.   Itching around the nipple left breast: instructed her to apply moisturizing lotion and or Benadryl or cortisone cream.   Return to clinic in 6 months for follow-up 

## 2015-08-10 ENCOUNTER — Ambulatory Visit (INDEPENDENT_AMBULATORY_CARE_PROVIDER_SITE_OTHER): Payer: BLUE CROSS/BLUE SHIELD | Admitting: Cardiovascular Disease

## 2015-08-10 ENCOUNTER — Encounter: Payer: Self-pay | Admitting: Cardiovascular Disease

## 2015-08-10 VITALS — BP 124/66 | HR 74 | Ht 61.5 in | Wt 147.9 lb

## 2015-08-10 DIAGNOSIS — I10 Essential (primary) hypertension: Secondary | ICD-10-CM

## 2015-08-10 DIAGNOSIS — E785 Hyperlipidemia, unspecified: Secondary | ICD-10-CM

## 2015-08-10 DIAGNOSIS — Z79899 Other long term (current) drug therapy: Secondary | ICD-10-CM

## 2015-08-10 DIAGNOSIS — Z139 Encounter for screening, unspecified: Secondary | ICD-10-CM

## 2015-08-10 DIAGNOSIS — D0502 Lobular carcinoma in situ of left breast: Secondary | ICD-10-CM

## 2015-08-10 NOTE — Progress Notes (Signed)
Patient ID: ZAKARA PARKEY, female   DOB: [birth year], 59 y.o.   MRN: [id number]     HPI: RICHIE VADALA is a 59 y.o. female presents to the office today for a one-year cardiology evaluation.  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  Ms. Santalucia has a history of hypertension and previous chest pain for which he was started on carvedilol and initially nifedipine by Dr. Elisabeth Cara with a presumptive dose of possible spasm. In August 2013 a nuclear perfusion study was entirely normal. 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 She has been on lipid-lowering therapy with atorvastatin. When I saw her 3 years ago I discontinued her nifedipine and switched her to amlodipine.  Her blood pressure has been well controlled on amlodipine 5 mg and carvedilol 6.25 mg twice a day. An echo Doppler study in August 2013 showed normal systolic function with  mild mitral regurgitation.  Ms. Dusing underwent right knee replacement surgery by Dr. Sandie Ano on 05/11/2014.  She tolerated that well from a cardiovascular standpoint.  Due to excessive scar tissue .  She underwent manipulation surgery which improved her significant scarring.  Since I last saw her one year ago, she has been down diagnosed as having lobular carcinoma in situ of her breast and has been placed on her tamoxifen 20 mg daily.  She currently denies any chest pain or shortness of breath.  She has not had recent laboratory.  She presents for one-year cardiology evaluation.   Past Medical History  Diagnosis Date  . Endometrial polyp   . Hypertension 05/22/12    ECHO-EF >55% trace tricupsid regurgitation. There is mild mitral regurgitation.   . Elevated cholesterol 05/22/12    Nuc stress test-Normal. The patiet had a markedly positive GXT for ischemia. post stress EF 72%  . Arthritis   . Cancer ([place])     hx of skin cancer     Past  Surgical History  Procedure Laterality Date  . Cesarean section      X 2  . Tubal ligation    . Dilation and curettage of uterus    . Hysteroscopy    . Cholecystectomy    . Abdominal surgery      Abdominoplasty  . Foot surgery    . Knee surgery      arthroscopic  . Total knee arthroplasty Right 05/11/2014    Procedure: RIGHT TOTAL KNEE ARTHROPLASTY;  Surgeon: [name], MD;  Location: [facility];  Service: Orthopedics;  Laterality: Right;  . Colonscopy    . Knee closed reduction Right 07/20/2014    Procedure: RIGHT KNEE CLOSED MANIPULATION;  Surgeon: [name], MD;  Location: [facility];  Service: Orthopedics;  Laterality: Right;  . Radioactive seed guided excisional breast biopsy N/A 10/06/2014    Procedure: RADIOACTIVE SEED GUIDED EXCISIONAL BREAST BIOPSY;  Surgeon: Rolm Bookbinder, MD;  Location: [place];  Service: General;  Laterality: N/A;    No Known Allergies  Current Outpatient Prescriptions  Medication Sig Dispense Refill  . acetaminophen (TYLENOL) 325 MG tablet Take 650 mg by mouth once as needed for mild pain or headache.    Marland Kitchen amLODipine (NORVASC) 5 MG tablet TAKE 1 TABLET (5 MG TOTAL) BY MOUTH AT BEDTIME. 90 tablet 3  . aspirin EC 81 MG tablet Take 81 mg by mouth every morning.    Marland Kitchen atorvastatin (LIPITOR)  40 MG tablet Take 1 tablet (40 mg total) by mouth daily. NEED OV. 90 tablet 0  . Calcium Carbonate-Vit D-Min (CALCIUM 1200 PO) Take 1 tablet by mouth every morning.    . carvedilol (COREG) 6.25 MG tablet Take 6.25 mg by mouth 2 (two) times daily with a meal.    . cholecalciferol (VITAMIN D) 1000 UNITS tablet Take 1,000 Units by mouth every morning.    Marland Kitchen glucosamine-chondroitin 500-400 MG tablet Take 1 tablet by mouth 3 (three) times daily.    Marland Kitchen PROAIR HFA 108 (90 BASE) MCG/ACT inhaler 1-2 puffs every 4 (four) hours as needed.   0  . tamoxifen (NOLVADEX) 20 MG tablet Take 1 tablet (20 mg total) by mouth daily. 90 tablet 3   No current  facility-administered medications for this visit.    Social History   Social History  . Marital Status: Married    Spouse Name: N/A  . Number of Children: N/A  . Years of Education: N/A   Occupational History  . Not on file.   Social History Main Topics  . Smoking status: Never Smoker   . Smokeless tobacco: Never Used  . Alcohol Use: 0.5 oz/week    1 Standard drinks or equivalent per week     Comment: occasional   . Drug Use: No  . Sexual Activity: Yes    Birth Control/ Protection: Surgical, Post-menopausal   Other Topics Concern  . Not on file   Social History Narrative   Additional social history is notable that she is married for 35 years. She is a Forensic psychologist. She works at the [employer] as a Marine scientist and recently changed to 3 days per week. There is no tobacco use. He does drink occasional wine. She does exercise fairly regularly on a bike and elliptical machine.  Family History  Problem Relation Age of Onset  . Heart disease Father   . Hypertension Paternal Grandfather   . Stroke Paternal Grandfather   . Aneurysm Mother   . Hyperlipidemia Mother   . Heart disease Maternal Grandfather   . Cancer - Other Maternal Grandfather     lung  . Hypertension Brother   . Cancer - Other Paternal Grandmother     uterine   ROS General: Negative; No fevers, chills, or night sweats;  HEENT: Negative; No changes in vision or hearing, sinus congestion, difficulty swallowing Pulmonary: Negative; No cough, wheezing, shortness of breath, hemoptysis Cardiovascular: Negative; No chest pain, presyncope, syncope, palpitations GI: Negative; No nausea, vomiting, diarrhea, or abdominal pain GU: Negative; No dysuria, hematuria, or difficulty voiding Musculoskeletal: Positive for right knee replacement surgery;  no myalgias, Hematologic/Oncology: Positive for diagnosis in December 2015 of lobular carcinoma in situ of her breast. Endocrine: Negative; no heat/cold  intolerance; no diabetes Neuro: Negative; no changes in balance, headaches Skin: Negative; No rashes or skin lesions Psychiatric: Negative; No behavioral problems, depression Sleep: Negative; No snoring, daytime sleepiness, hypersomnolence, bruxism, restless legs, hypnogognic hallucinations, no cataplexy Other comprehensive 14 point system review is negative.  PE BP 124/66 mmHg  Pulse 74  Ht 5' 1.5" (1.562 m)  Wt 147 lb 14.4 oz (67.087 kg)  BMI 27.50 kg/m2  LMP 11/19/2011  Repeat blood pressure by me 130/80 General: Alert, oriented, no distress.  Skin: normal turgor, no rashes HEENT: Normocephalic, atraumatic. Pupils round and reactive; sclera anicteric;no lid lag.  Nose without nasal septal hypertrophy Mouth/Parynx benign; Mallinpatti scale 2 Neck: No JVD, no carotid bruits with normal carotid upstroke Chest wall: Nontender  to palpation Lungs: clear to ausculatation and percussion; no wheezing or rales Heart: RRR, s1 s2 normal 1/6 systolic murmur at the apex compatible with mitral regurgitation Abdomen: soft, nontender; no hepatosplenomehaly, BS+; abdominal aorta nontender and not dilated by palpation. Back: No CVA tenderness Pulses 2+ Extremities: no clubbing cyanosis or edema, Homan's sign negative  Neurologic: grossly nonfocal Psychological: Normal affect and mood  ECG (independently read by me): Normal sinus rhythm at 74 bpm.  No ectopy.  Normal intervals.  No ST segment changes.  October 2015 ECG (independently read by me) from 07/17/2014: Normal sinus rhythm.  No ectopy.  Normal intervals.  No ST segment changes.  August 2014 ECG: Normal sinus rhythm at 75 beats per minute without ectopy. Normal intervals.  LABS: BMP Latest Ref Rng 09/30/2014 07/17/2014 05/13/2014  Glucose 70 - 99 mg/dL 97 101(H) 150(H)  BUN 6 - 23 mg/dL 14 15 8   Creatinine 0.50 - 1.10 mg/dL 0.64 0.64 0.51  Sodium 135 - 145 mmol/L 141 141 142  Potassium 3.5 - 5.1 mmol/L 4.3 4.9 3.9  Chloride 96 - 112  mEq/L 106 102 105  CO2 19 - 32 mmol/L 27 27 27   Calcium 8.4 - 10.5 mg/dL 9.5 9.4 9.1   Hepatic Function Latest Ref Rng 04/30/2014  Total Protein 6.0 - 8.3 g/dL 7.1  Albumin 3.5 - 5.2 g/dL 4.2  AST 0 - 37 U/L 37  ALT 0 - 35 U/L 29  Alk Phosphatase 39 - 117 U/L 54  Total Bilirubin 0.3 - 1.2 mg/dL 0.5   CBC Latest Ref Rng 10/06/2014 09/30/2014 07/17/2014  WBC 4.0 - 10.5 K/uL - 14.0(H) 12.9(H)  Hemoglobin 12.0 - 15.0 g/dL 14.0 12.9 12.9  Hematocrit 36.0 - 46.0 % - 40.0 40.0  Platelets 150 - 400 K/uL - 333 272   Lab Results  Component Value Date   MCV 90.3 09/30/2014   MCV 89.3 07/17/2014   MCV 90.5 05/13/2014   No results found for: TSH   Lipid Panel  No results found for: CHOL, TRIG, HDL, CHOLHDL, VLDL, LDLCALC, LDLDIRECT    ASSESSMENT AND PLAN: Ms. Celisa Schoenberg is a 59 year old female who has a history of hypertension, hyperlipidemia, and recently was diagnosed as having lobular carcinoma in situ of her breast.  Over the past year, she states her blood pressure has been fairly well controlled.  Her blood pressure today is stable on her combination therapy with amlodipine 5 mg and carvedilol 6.25 mg twice a day.  A previous echo Doppler study had shown borderline left ventricular hypertrophy with normal systolic function.  A remote nuclear perfusion study revealed normal myocardial perfusion.  Previous [facility] heart lab genetic testing had demonstrated increased cardiovascular risk.   She is on Lipitor 40 mg daily for hyperlipidemia.  She is tolerating tamoxifen without side effects.  She has not had recent laboratory.  A complete set of blood work will be obtained in the fasting state.  Adjustments to her medical regimen will be made if necessary.  As long as she remains stable, I will see her in one year for cardiology reevaluation.  Time spent: 25 minutes Troy Sine, MD, [facility]  08/10/2015 3:58 PM

## 2015-08-10 NOTE — Patient Instructions (Signed)
Your physician recommends that you return for lab work fasting.   Your physician wants you to follow-up in: 1 year or sooner if needed. You will receive a reminder letter in the mail two months in advance. If you don't receive a letter, please call our office to schedule the follow-up appointment.  If you need a refill on your cardiac medications before your next appointment, please call your pharmacy.    

## 2015-08-20 LAB — LIPID PANEL
Cholesterol: 167 mg/dL (ref 125–200)
HDL: 73 mg/dL (ref >=46)
LDL Cholesterol: 77 mg/dL (ref <130)
Total CHOL/HDL Ratio: 2.3 Ratio (ref <=5.0)
Triglycerides: 84 mg/dL (ref <150)
VLDL: 17 mg/dL (ref <30)

## 2015-08-20 LAB — CBC
HCT: 40.1 % (ref 36.0–46.0)
Hemoglobin: 13.3 g/dL (ref 12.0–15.0)
MCH: 30.5 pg (ref 26.0–34.0)
MCHC: 33.2 g/dL (ref 30.0–36.0)
MCV: 92 fL (ref 78.0–100.0)
MPV: 9.7 fL (ref 8.6–12.4)
Platelets: 225 10*3/uL (ref 150–400)
RBC: 4.36 MIL/uL (ref 3.87–5.11)
RDW: 13.5 % (ref 11.5–15.5)
WBC: 17.3 10*3/uL — ABNORMAL HIGH (ref 4.0–10.5)

## 2015-08-20 LAB — COMPREHENSIVE METABOLIC PANEL
ALT: 17 U/L (ref 6–29)
AST: 19 U/L (ref 10–35)
Albumin: 4.2 g/dL (ref 3.6–5.1)
Alkaline Phosphatase: 32 U/L — ABNORMAL LOW (ref 33–130)
BILIRUBIN TOTAL: 0.5 mg/dL (ref 0.2–1.2)
BUN: 17 mg/dL (ref 7–25)
CALCIUM: 8.9 mg/dL (ref 8.6–10.4)
CHLORIDE: 105 mmol/L (ref 98–110)
CO2: 28 mmol/L (ref 20–31)
Creat: 0.63 mg/dL (ref 0.50–1.05)
Glucose, Bld: 96 mg/dL (ref 65–99)
Potassium: 4.3 mmol/L (ref 3.5–5.3)
SODIUM: 140 mmol/L (ref 135–146)
Total Protein: 6.5 g/dL (ref 6.1–8.1)

## 2015-08-20 LAB — HEMOGLOBIN A1C
Hgb A1c MFr Bld: 5.8 % — ABNORMAL HIGH (ref <5.7)
Mean Plasma Glucose: 120 mg/dL — ABNORMAL HIGH (ref <117)

## 2015-08-20 LAB — TSH: TSH: 2.387 u[IU]/mL (ref 0.350–4.500)

## 2015-08-28 ENCOUNTER — Other Ambulatory Visit: Payer: Self-pay | Admitting: Cardiovascular Disease

## 2015-09-10 ENCOUNTER — Telehealth: Payer: Self-pay | Admitting: Hematology and Oncology

## 2015-09-10 NOTE — Telephone Encounter (Signed)
Patient called recently re inquiring about next f/u and per response from [facility] patient to f/u in March 2017. Left message for patient re f/u for 01/06/16 and mailed schedule.

## 2015-10-10 HISTORY — PX: PARTIAL KNEE ARTHROPLASTY: SHX2174

## 2015-11-02 ENCOUNTER — Other Ambulatory Visit: Payer: Self-pay | Admitting: Hematology and Oncology

## 2015-11-03 NOTE — Telephone Encounter (Signed)
Chart reviewed.

## 2016-01-06 ENCOUNTER — Encounter: Payer: Self-pay | Admitting: Hematology and Oncology

## 2016-01-06 ENCOUNTER — Telehealth: Payer: Self-pay | Admitting: Hematology and Oncology

## 2016-01-06 ENCOUNTER — Ambulatory Visit (HOSPITAL_BASED_OUTPATIENT_CLINIC_OR_DEPARTMENT_OTHER): Payer: BLUE CROSS/BLUE SHIELD | Admitting: Hematology and Oncology

## 2016-01-06 VITALS — BP 130/65 | HR 74 | Temp 99.1°F | Resp 18 | Wt 146.3 lb

## 2016-01-06 DIAGNOSIS — Z170 Estrogen receptor positive status [ER+]: Secondary | ICD-10-CM | POA: Diagnosis not present

## 2016-01-06 DIAGNOSIS — D0502 Lobular carcinoma in situ of left breast: Secondary | ICD-10-CM

## 2016-01-06 DIAGNOSIS — Z79810 Long term (current) use of selective estrogen receptor modulators (SERMs): Secondary | ICD-10-CM

## 2016-01-06 NOTE — Assessment & Plan Note (Signed)
Left breast LCIS status post lumpectomy 10/06/2014; Started tamoxifen for breast cancer risk reduction 20 mg daily 11/18/2014 , plan is to treat for 5 years   Tamoxifen toxicities:  1. Hot flashes 6-8 per day : patient does not think they're significant because she works in the [place] where it is extremely cold and it does not seem to bother her too much. It wakes her up once or twice at night. She was started on clonidine by her gynecologist. She does not think it is been helpful. She plans to stop it.  2. Denies any myalgias.  Itching around the nipple left breast: instructed her to apply moisturizing lotion and or Benadryl or cortisone cream.   Return to clinic in 1 year for follow-up

## 2016-01-06 NOTE — Telephone Encounter (Signed)
appt made and avs printed °

## 2016-01-06 NOTE — Progress Notes (Signed)
Patient Care Team: Carol Ada, MD as PCP - General (Family Medicine)  DIAGNOSIS: Left breast LCIS. Current treatment: Tamoxifen 20 mg daily  CHIEF COMPLIANT: Hot flashes due to tamoxifen  INTERVAL HISTORY: Colleen Shannon is a 60 year old with above-mentioned history of LCIS status post surgery and currently on risk reduction therapy with tamoxifen. She has been tolerating tamoxifen extremely well. She denies any major problems other than mild hot flashes. She had a mammogram in December 2016 which was essentially benign.  REVIEW OF SYSTEMS:   Constitutional: Denies fevers, chills or abnormal weight loss Eyes: Denies blurriness of vision Ears, nose, mouth, throat, and face: Denies mucositis or sore throat Respiratory: Denies cough, dyspnea or wheezes Cardiovascular: Denies palpitation, chest discomfort Gastrointestinal:  Denies nausea, heartburn or change in bowel habits Skin: Denies abnormal skin rashes Lymphatics: Denies new lymphadenopathy or easy bruising Neurological:Denies numbness, tingling or new weaknesses Behavioral/Psych: Mood is stable, no new changes  Extremities: No lower extremity edema Breast:  denies any pain or lumps or nodules in either breasts All other systems were reviewed with the patient and are negative.  I have reviewed the past medical history, past surgical history, social history and family history with the patient and they are unchanged from previous note.  ALLERGIES:  has No Known Allergies.  MEDICATIONS:  Current Outpatient Prescriptions  Medication Sig Dispense Refill  . acetaminophen (TYLENOL) 325 MG tablet Take 650 mg by mouth once as needed for mild pain or headache.    Marland Kitchen amLODipine (NORVASC) 5 MG tablet TAKE 1 TABLET BY MOUTH AT BEDTIME 90 tablet 3  . aspirin EC 81 MG tablet Take 81 mg by mouth every morning.    Marland Kitchen atorvastatin (LIPITOR) 40 MG tablet TAKE 1 TABLET (40 MG TOTAL) BY MOUTH DAILY. NEED OV. 90 tablet 3  . Calcium Carbonate-Vit  D-Min (CALCIUM 1200 PO) Take 1 tablet by mouth every morning.    . carvedilol (COREG) 6.25 MG tablet Take 6.25 mg by mouth 2 (two) times daily with a meal.    . carvedilol (COREG) 6.25 MG tablet TAKE 1 TABLET BY MOUTH WITH MEALS TWICE A DAY 180 tablet 3  . cholecalciferol (VITAMIN D) 1000 UNITS tablet Take 1,000 Units by mouth every morning.    Marland Kitchen glucosamine-chondroitin 500-400 MG tablet Take 1 tablet by mouth 3 (three) times daily.    Marland Kitchen PROAIR HFA 108 (90 BASE) MCG/ACT inhaler 1-2 puffs every 4 (four) hours as needed.   0  . tamoxifen (NOLVADEX) 20 MG tablet TAKE 1 TABLET (20 MG TOTAL) BY MOUTH DAILY. 90 tablet 0   No current facility-administered medications for this visit.    PHYSICAL EXAMINATION: ECOG PERFORMANCE STATUS: 0 - Asymptomatic  Filed Vitals:   01/06/16 1459  BP: 130/65  Pulse: 74  Temp: 99.1 F (37.3 C)  Resp: 18   Filed Weights   01/06/16 1459  Weight: 146 lb 4.8 oz (66.361 kg)    GENERAL:alert, no distress and comfortable SKIN: skin color, texture, turgor are normal, no rashes or significant lesions EYES: normal, Conjunctiva are pink and non-injected, sclera clear OROPHARYNX:no exudate, no erythema and lips, buccal mucosa, and tongue normal  NECK: supple, thyroid normal size, non-tender, without nodularity LYMPH:  no palpable lymphadenopathy in the cervical, axillary or inguinal LUNGS: clear to auscultation and percussion with normal breathing effort HEART: regular rate & rhythm and no murmurs and no lower extremity edema ABDOMEN:abdomen soft, non-tender and normal bowel sounds MUSCULOSKELETAL:no cyanosis of digits and no clubbing  NEURO: alert &  oriented x 3 with fluent speech, no focal motor/sensory deficits EXTREMITIES: No lower extremity edema BREAST: No palpable masses or nodules in either right or left breasts. No palpable axillary supraclavicular or infraclavicular adenopathy no breast tenderness or nipple discharge. (exam performed in the presence of a  chaperone)  LABORATORY DATA:  I have reviewed the data as listed   Chemistry      Component Value Date/Time   NA 140 08/19/2015 0857   K 4.3 08/19/2015 0857   CL 105 08/19/2015 0857   CO2 28 08/19/2015 0857   BUN 17 08/19/2015 0857   CREATININE 0.63 08/19/2015 0857   CREATININE 0.64 09/30/2014 1200      Component Value Date/Time   CALCIUM 8.9 08/19/2015 0857   ALKPHOS 32* 08/19/2015 0857   AST 19 08/19/2015 0857   ALT 17 08/19/2015 0857   BILITOT 0.5 08/19/2015 0857       Lab Results  Component Value Date   WBC 17.3* 08/19/2015   HGB 13.3 08/19/2015   HCT 40.1 08/19/2015   MCV 92.0 08/19/2015   PLT 225 08/19/2015   NEUTROABS 2.9 09/30/2014   ASSESSMENT & PLAN:   Neoplasm of left breast, primary tumor staging category Tis: lobular carcinoma in situ (LCIS) Left breast LCIS status post lumpectomy 10/06/2014; Started tamoxifen for breast cancer risk reduction 20 mg daily 11/18/2014 , plan is to treat for 5 years   Tamoxifen toxicities:  1. Hot flashes 6-8 per day : patient does not think they're significant because she works in the [place] where it is extremely cold and it does not seem to bother her too much. It wakes her up once or twice at night.  2. Denies any myalgias.  Itching around the nipple left breast: resolved  Return to clinic in 6 months for follow-up   No orders of the defined types were placed in this encounter.   The patient has a good understanding of the overall plan. she agrees with it. she will call with any problems that may develop before the next visit here.   Rulon Eisenmenger, MD 01/06/2016

## 2016-01-06 NOTE — Progress Notes (Signed)
Unable to get in to exam room prior to MD.  No assessment performed.  

## 2016-01-20 DIAGNOSIS — Z0000 Encounter for general adult medical examination without abnormal findings: Secondary | ICD-10-CM | POA: Diagnosis not present

## 2016-01-20 DIAGNOSIS — Z1211 Encounter for screening for malignant neoplasm of colon: Secondary | ICD-10-CM | POA: Diagnosis not present

## 2016-01-20 DIAGNOSIS — E559 Vitamin D deficiency, unspecified: Secondary | ICD-10-CM | POA: Diagnosis not present

## 2016-01-20 DIAGNOSIS — I10 Essential (primary) hypertension: Secondary | ICD-10-CM | POA: Diagnosis not present

## 2016-01-20 DIAGNOSIS — E7800 Pure hypercholesterolemia, unspecified: Secondary | ICD-10-CM | POA: Diagnosis not present

## 2016-01-20 DIAGNOSIS — Z96651 Presence of right artificial knee joint: Secondary | ICD-10-CM | POA: Diagnosis not present

## 2016-01-20 DIAGNOSIS — Z471 Aftercare following joint replacement surgery: Secondary | ICD-10-CM | POA: Diagnosis not present

## 2016-01-20 DIAGNOSIS — M1712 Unilateral primary osteoarthritis, left knee: Secondary | ICD-10-CM | POA: Diagnosis not present

## 2016-01-25 DIAGNOSIS — D72829 Elevated white blood cell count, unspecified: Secondary | ICD-10-CM | POA: Diagnosis not present

## 2016-01-26 DIAGNOSIS — R591 Generalized enlarged lymph nodes: Secondary | ICD-10-CM | POA: Diagnosis not present

## 2016-01-26 DIAGNOSIS — D72820 Lymphocytosis (symptomatic): Secondary | ICD-10-CM | POA: Diagnosis not present

## 2016-01-27 DIAGNOSIS — D72820 Lymphocytosis (symptomatic): Secondary | ICD-10-CM | POA: Diagnosis not present

## 2016-01-28 ENCOUNTER — Telehealth: Payer: Self-pay | Admitting: Hematology and Oncology

## 2016-01-28 ENCOUNTER — Telehealth: Payer: Self-pay

## 2016-01-28 DIAGNOSIS — D0502 Lobular carcinoma in situ of left breast: Secondary | ICD-10-CM

## 2016-01-28 DIAGNOSIS — N840 Polyp of corpus uteri: Secondary | ICD-10-CM

## 2016-01-28 NOTE — Telephone Encounter (Signed)
Received information from pts PCP showing concern for continued elevated WBC.  Reviewed with Dr. Lindi Adie who wishes pt to come in for lab work to follow up on and then to see her 1 week following lab work.  Left VM for patient to return call so we can schedule this as soon as possible.

## 2016-01-28 NOTE — Telephone Encounter (Signed)
New patient referral received in office today from [facility], however patient is already established with Dr. Lindi Adie. New information forwarded to collaborative nurse today. Followed up with collaborative nurse and per nurse this has been taken care of, she has spoken to patient who is going out of town and will be brought in for labs and a f/u with [facility] if needed. Left message for [facility] with this information.

## 2016-01-31 ENCOUNTER — Other Ambulatory Visit (HOSPITAL_BASED_OUTPATIENT_CLINIC_OR_DEPARTMENT_OTHER): Payer: BLUE CROSS/BLUE SHIELD

## 2016-01-31 ENCOUNTER — Other Ambulatory Visit: Payer: Self-pay | Admitting: Hematology and Oncology

## 2016-01-31 ENCOUNTER — Other Ambulatory Visit (HOSPITAL_COMMUNITY)
Admission: RE | Admit: 2016-01-31 | Discharge: 2016-01-31 | Disposition: A | Discharge status: Home / Self Care | Payer: BLUE CROSS/BLUE SHIELD | Source: Ambulatory Visit | Attending: Hematology and Oncology | Admitting: Hematology and Oncology

## 2016-01-31 DIAGNOSIS — N840 Polyp of corpus uteri: Secondary | ICD-10-CM | POA: Diagnosis not present

## 2016-01-31 DIAGNOSIS — D0502 Lobular carcinoma in situ of left breast: Secondary | ICD-10-CM | POA: Diagnosis not present

## 2016-01-31 DIAGNOSIS — C9110 Chronic lymphocytic leukemia of B-cell type not having achieved remission: Secondary | ICD-10-CM | POA: Diagnosis not present

## 2016-02-01 ENCOUNTER — Other Ambulatory Visit: Payer: Self-pay

## 2016-02-01 LAB — FLOW CYTOMETRY

## 2016-02-01 MED ORDER — TAMOXIFEN CITRATE 20 MG PO TABS: ORAL_TABLET | ORAL | Status: DC

## 2016-02-08 ENCOUNTER — Ambulatory Visit (HOSPITAL_BASED_OUTPATIENT_CLINIC_OR_DEPARTMENT_OTHER): Payer: BLUE CROSS/BLUE SHIELD | Admitting: Hematology and Oncology

## 2016-02-08 ENCOUNTER — Other Ambulatory Visit (HOSPITAL_COMMUNITY)
Admission: RE | Admit: 2016-02-08 | Discharge: 2016-02-08 | Disposition: A | Discharge status: Home / Self Care | Payer: BLUE CROSS/BLUE SHIELD | Source: Ambulatory Visit | Attending: Hematology and Oncology | Admitting: Hematology and Oncology

## 2016-02-08 ENCOUNTER — Telehealth: Payer: Self-pay | Admitting: Hematology and Oncology

## 2016-02-08 ENCOUNTER — Ambulatory Visit (HOSPITAL_BASED_OUTPATIENT_CLINIC_OR_DEPARTMENT_OTHER): Payer: BLUE CROSS/BLUE SHIELD

## 2016-02-08 VITALS — BP 129/57 | HR 78 | Temp 98.0°F | Resp 18 | Ht 61.5 in | Wt 149.5 lb

## 2016-02-08 DIAGNOSIS — C9110 Chronic lymphocytic leukemia of B-cell type not having achieved remission: Secondary | ICD-10-CM

## 2016-02-08 DIAGNOSIS — D0502 Lobular carcinoma in situ of left breast: Secondary | ICD-10-CM | POA: Diagnosis not present

## 2016-02-08 DIAGNOSIS — C9480 Other specified leukemias not having achieved remission: Secondary | ICD-10-CM | POA: Diagnosis not present

## 2016-02-08 DIAGNOSIS — Z79810 Long term (current) use of selective estrogen receptor modulators (SERMs): Secondary | ICD-10-CM | POA: Diagnosis not present

## 2016-02-08 DIAGNOSIS — Z170 Estrogen receptor positive status [ER+]: Secondary | ICD-10-CM

## 2016-02-08 LAB — CBC WITH DIFFERENTIAL/PLATELET
BASO%: 0.4 % (ref 0.0–2.0)
Basophils Absolute: 0.1 10*3/uL (ref 0.0–0.1)
EOS%: 0.8 % (ref 0.0–7.0)
Eosinophils Absolute: 0.2 10*3/uL (ref 0.0–0.5)
HCT: 40.2 % (ref 34.8–46.6)
HGB: 12.8 g/dL (ref 11.6–15.9)
LYMPH%: 80.8 % — AB (ref 14.0–49.7)
MCH: 29.7 pg (ref 25.1–34.0)
MCHC: 31.9 g/dL (ref 31.5–36.0)
MCV: 92.9 fL (ref 79.5–101.0)
MONO#: 0.9 10*3/uL (ref 0.1–0.9)
MONO%: 3.7 % (ref 0.0–14.0)
NEUT#: 3.6 10*3/uL (ref 1.5–6.5)
NEUT%: 14.3 % — ABNORMAL LOW (ref 38.4–76.8)
Platelets: 197 10*3/uL (ref 145–400)
RBC: 4.32 10*6/uL (ref 3.70–5.45)
RDW: 13.3 % (ref 11.2–14.5)
WBC: 25.2 10*3/uL — AB (ref 3.9–10.3)
lymph#: 20.4 10*3/uL — ABNORMAL HIGH (ref 0.9–3.3)

## 2016-02-08 LAB — COMPREHENSIVE METABOLIC PANEL
ALT: 19 U/L (ref 0–55)
AST: 19 U/L (ref 5–34)
Albumin: 3.9 g/dL (ref 3.5–5.0)
Alkaline Phosphatase: 31 U/L — ABNORMAL LOW (ref 40–150)
Anion Gap: 8 mEq/L (ref 3–11)
BUN: 15 mg/dL (ref 7.0–26.0)
CALCIUM: 9.3 mg/dL (ref 8.4–10.4)
CHLORIDE: 107 meq/L (ref 98–109)
CO2: 28 mEq/L (ref 22–29)
Creatinine: 0.8 mg/dL (ref 0.6–1.1)
EGFR: 83 mL/min/{1.73_m2} — ABNORMAL LOW (ref >90)
GLUCOSE: 95 mg/dL (ref 70–140)
Potassium: 4 mEq/L (ref 3.5–5.1)
SODIUM: 143 meq/L (ref 136–145)
Total Bilirubin: 0.35 mg/dL (ref 0.20–1.20)
Total Protein: 6.6 g/dL (ref 6.4–8.3)

## 2016-02-08 LAB — TECHNOLOGIST REVIEW

## 2016-02-08 LAB — LACTATE DEHYDROGENASE: LDH: 231 U/L (ref 125–245)

## 2016-02-08 NOTE — Assessment & Plan Note (Signed)
Left breast LCIS status post lumpectomy 10/06/2014; Started tamoxifen for breast cancer risk reduction 20 mg daily 11/18/2014 , plan is to treat for 5 years   Tamoxifen toxicities:  1. Hot flashes 6-8 per day : patient does not think they're significant because she works in the [place] where it is extremely cold and it does not seem to bother her too much. It wakes her up once or twice at night.  2. Denies any myalgias.  Itching around the nipple left breast: resolved  Return to clinic in 6 months for follow-up

## 2016-02-08 NOTE — Progress Notes (Signed)
Patient Care Team: Carol Ada, MD as PCP - General (Family Medicine)  DIAGNOSIS: Newly diagnosed CLL and prior history of LCIS  CHIEF COMPLIANT: Follow-up after recent blood work showing CLL  INTERVAL HISTORY: Colleen Shannon is a 60 year old with above-mentioned history of LCIS on tamoxifen therapy presented to our office with leukocytosis and workup of that with flow cytometry revealed that this was a B-cell CLL. She is accompanied by her husband today and is here to discuss the report. She has noticed swollen left-sided neck glands that were attributed to upper respiratory infection that she recently had. These appear to be getting smaller and going away. She recently has had low-grade temperatures but that could be related to the upper respiratory illness. She denies any weight loss. Denies any fatigue. Because of tamoxifen she does have hot flashes.  REVIEW OF SYSTEMS:   Constitutional: Denies fevers, chills or abnormal weight loss Eyes: Denies blurriness of vision Ears, nose, mouth, throat, and face: Denies mucositis or sore throat Respiratory: Denies cough, dyspnea or wheezes Cardiovascular: Denies palpitation, chest discomfort Gastrointestinal:  Denies nausea, heartburn or change in bowel habits Skin: Denies abnormal skin rashes Lymphatics: Left-sided neck lymph nodes aren't enlarged Neurological:Denies numbness, tingling or new weaknesses Behavioral/Psych: Mood is stable, no new changes  Extremities: No lower extremity edema All other systems were reviewed with the patient and are negative.  I have reviewed the past medical history, past surgical history, social history and family history with the patient and they are unchanged from previous note.  ALLERGIES:  has No Known Allergies.  MEDICATIONS:  Current Outpatient Prescriptions  Medication Sig Dispense Refill  . acetaminophen (TYLENOL) 325 MG tablet Take 650 mg by mouth once as needed for mild pain or headache.    Marland Kitchen  amLODipine (NORVASC) 5 MG tablet TAKE 1 TABLET BY MOUTH AT BEDTIME 90 tablet 3  . aspirin EC 81 MG tablet Take 81 mg by mouth every morning.    Marland Kitchen atorvastatin (LIPITOR) 40 MG tablet TAKE 1 TABLET (40 MG TOTAL) BY MOUTH DAILY. NEED OV. 90 tablet 3  . Calcium Carbonate-Vit D-Min (CALCIUM 1200 PO) Take 1 tablet by mouth every morning.    . carvedilol (COREG) 6.25 MG tablet Take 6.25 mg by mouth 2 (two) times daily with a meal.    . cholecalciferol (VITAMIN D) 1000 UNITS tablet Take 1,000 Units by mouth every morning.    Marland Kitchen glucosamine-chondroitin 500-400 MG tablet Take 1 tablet by mouth 3 (three) times daily.    Marland Kitchen PROAIR HFA 108 (90 BASE) MCG/ACT inhaler 1-2 puffs every 4 (four) hours as needed.   0  . tamoxifen (NOLVADEX) 20 MG tablet TAKE 1 TABLET (20 MG TOTAL) BY MOUTH DAILY. 90 tablet 0   No current facility-administered medications for this visit.    PHYSICAL EXAMINATION: ECOG PERFORMANCE STATUS: 1 - Symptomatic but completely ambulatory  Filed Vitals:   02/08/16 0822  BP: 129/57  Pulse: 78  Temp: 98 F (36.7 C)  Resp: 18   Filed Weights   02/08/16 0822  Weight: 149 lb 8 oz (67.813 kg)    GENERAL:alert, no distress and comfortable SKIN: skin color, texture, turgor are normal, no rashes or significant lesions EYES: normal, Conjunctiva are pink and non-injected, sclera clear OROPHARYNX:no exudate, no erythema and lips, buccal mucosa, and tongue normal  NECK: supple, thyroid normal size, non-tender, without nodularity LYMPH:  Left cervical lymphadenopathy small LUNGS: clear to auscultation and percussion with normal breathing effort HEART: regular rate & rhythm and  no murmurs and no lower extremity edema ABDOMEN:abdomen soft, non-tender and normal bowel sounds MUSCULOSKELETAL:no cyanosis of digits and no clubbing  NEURO: alert & oriented x 3 with fluent speech, no focal motor/sensory deficits EXTREMITIES: No lower extremity edema  LABORATORY DATA:  I have reviewed the data as  listed   Chemistry      Component Value Date/Time   NA 143 02/08/2016 0927   NA 140 08/19/2015 0857   K 4.0 02/08/2016 0927   K 4.3 08/19/2015 0857   CL 105 08/19/2015 0857   CO2 28 02/08/2016 0927   CO2 28 08/19/2015 0857   BUN 15.0 02/08/2016 0927   BUN 17 08/19/2015 0857   CREATININE 0.8 02/08/2016 0927   CREATININE 0.63 08/19/2015 0857   CREATININE 0.64 09/30/2014 1200      Component Value Date/Time   CALCIUM 9.3 02/08/2016 0927   CALCIUM 8.9 08/19/2015 0857   ALKPHOS 31* 02/08/2016 0927   ALKPHOS 32* 08/19/2015 0857   AST 19 02/08/2016 0927   AST 19 08/19/2015 0857   ALT 19 02/08/2016 0927   ALT 17 08/19/2015 0857   BILITOT 0.35 02/08/2016 0927   BILITOT 0.5 08/19/2015 0857       Lab Results  Component Value Date   WBC 25.2* 02/08/2016   HGB 12.8 02/08/2016   HCT 40.2 02/08/2016   MCV 92.9 02/08/2016   PLT 197 02/08/2016   NEUTROABS 3.6 02/08/2016   ASSESSMENT & PLAN:  Neoplasm of left breast, primary tumor staging category Tis: lobular carcinoma in situ (LCIS) Left breast LCIS status post lumpectomy 10/06/2014; Started tamoxifen for breast cancer risk reduction 20 mg daily 11/18/2014 , plan is to treat for 5 years   Tamoxifen toxicities:  1. Hot flashes 6-8 per day : patient does not think they're significant because she works in the [place] where it is extremely cold and it does not seem to bother her too much. It wakes her up once or twice at night.  2. Denies any myalgias.  Itching around the nipple left breast: resolved  Return to clinic in 6 months for follow-up  Chronic lymphocytic leukemia (CLL), B-cell (HCC) Lymphocytosis with cervical lymphadenopathy, flow cytometry revealed B-cell CLL CD5 and CD23 positive ALC 20.4 Hb: 12.8 Pl: 197 Left cervical lymphadenopathy Staging of CLL: I suspect that the patient has stage I CLL She does not have any specific B symptoms.  Indications for treatment of CLL: Patient does not have any indications to treat  urgently. In general indications for treatment include 1. B symptoms 2. Stage III/IV 3. Rapid doubling time  Prognosis of CLL: I discussed with the patient extensively that the prognosis would depend on the doubling time as well as symptoms in addition to that I would like to send for molecular FISH panel to see if the patient has any high risk features to determine prognosis. I will call the patient with the result of this test.  I would like to repeat a CBC with differential in 3 months along with CMP and LDH.        Orders Placed This Encounter  Procedures  . Lactate dehydrogenase (LDH)    Standing Status: Future     Number of Occurrences: 1     Standing Expiration Date: 02/07/2017  . Comprehensive metabolic panel    Standing Status: Future     Number of Occurrences: 1     Standing Expiration Date: 02/07/2017  . CBC with Differential    Standing Status: Future  Number of Occurrences: 1     Standing Expiration Date: 02/07/2017  . FISH, Peripheral Blood    CLL panel    Standing Status: Future     Number of Occurrences: 1     Standing Expiration Date: 02/07/2017   The patient has a good understanding of the overall plan. she agrees with it. she will call with any problems that may develop before the next visit here.   Rulon Eisenmenger, MD 02/08/2016     .

## 2016-02-08 NOTE — Progress Notes (Signed)
Note created by Dr. Gudena during office visit, copy to patient,original to scan. 

## 2016-02-08 NOTE — Telephone Encounter (Signed)
appt made and avs printed °

## 2016-02-08 NOTE — Assessment & Plan Note (Signed)
Lymphocytosis with cervical lymphadenopathy, flow cytometry revealed B-cell CLL CD5 and CD23 positive ALC 20.4 Hb: 12.8 Pl: 197 Left cervical lymphadenopathy Staging of CLL: I suspect that the patient has stage I CLL She does not have any specific B symptoms.  Indications for treatment of CLL: Patient does not have any indications to treat urgently. In general indications for treatment include 1. B symptoms 2. Stage III/IV 3. Rapid doubling time  Prognosis of CLL: I discussed with the patient extensively that the prognosis would depend on the doubling time as well as symptoms in addition to that I would like to send for molecular FISH panel to see if the patient has any high risk features to determine prognosis. I will call the patient with the result of this test.  I would like to repeat a CBC with differential in 3 months along with CMP and LDH.

## 2016-02-11 LAB — TISSUE HYBRIDIZATION TO NCBH

## 2016-02-11 LAB — FISH, PERIPHERAL BLOOD

## 2016-04-13 DIAGNOSIS — Z6827 Body mass index (BMI) 27.0-27.9, adult: Secondary | ICD-10-CM | POA: Diagnosis not present

## 2016-04-13 DIAGNOSIS — Z01419 Encounter for gynecological examination (general) (routine) without abnormal findings: Secondary | ICD-10-CM | POA: Diagnosis not present

## 2016-04-13 DIAGNOSIS — Z1212 Encounter for screening for malignant neoplasm of rectum: Secondary | ICD-10-CM | POA: Diagnosis not present

## 2016-05-04 ENCOUNTER — Other Ambulatory Visit: Payer: Self-pay | Admitting: Hematology and Oncology

## 2016-05-16 ENCOUNTER — Ambulatory Visit: Payer: BLUE CROSS/BLUE SHIELD | Admitting: Hematology and Oncology

## 2016-05-16 ENCOUNTER — Other Ambulatory Visit: Payer: BLUE CROSS/BLUE SHIELD

## 2016-05-21 DIAGNOSIS — T148 Other injury of unspecified body region: Secondary | ICD-10-CM | POA: Diagnosis not present

## 2016-05-21 DIAGNOSIS — L03116 Cellulitis of left lower limb: Secondary | ICD-10-CM | POA: Diagnosis not present

## 2016-05-23 DIAGNOSIS — L03032 Cellulitis of left toe: Secondary | ICD-10-CM | POA: Diagnosis not present

## 2016-05-24 ENCOUNTER — Other Ambulatory Visit: Payer: Self-pay

## 2016-05-24 DIAGNOSIS — C9110 Chronic lymphocytic leukemia of B-cell type not having achieved remission: Secondary | ICD-10-CM

## 2016-05-25 ENCOUNTER — Ambulatory Visit (HOSPITAL_BASED_OUTPATIENT_CLINIC_OR_DEPARTMENT_OTHER): Payer: BLUE CROSS/BLUE SHIELD | Admitting: Hematology and Oncology

## 2016-05-25 ENCOUNTER — Telehealth: Payer: Self-pay | Admitting: Hematology and Oncology

## 2016-05-25 ENCOUNTER — Other Ambulatory Visit (HOSPITAL_BASED_OUTPATIENT_CLINIC_OR_DEPARTMENT_OTHER): Payer: BLUE CROSS/BLUE SHIELD

## 2016-05-25 DIAGNOSIS — Z79810 Long term (current) use of selective estrogen receptor modulators (SERMs): Secondary | ICD-10-CM

## 2016-05-25 DIAGNOSIS — C9110 Chronic lymphocytic leukemia of B-cell type not having achieved remission: Secondary | ICD-10-CM

## 2016-05-25 DIAGNOSIS — D0502 Lobular carcinoma in situ of left breast: Secondary | ICD-10-CM | POA: Diagnosis not present

## 2016-05-25 DIAGNOSIS — Z170 Estrogen receptor positive status [ER+]: Secondary | ICD-10-CM

## 2016-05-25 LAB — COMPREHENSIVE METABOLIC PANEL
ALT: 16 U/L (ref 0–55)
ANION GAP: 8 meq/L (ref 3–11)
AST: 18 U/L (ref 5–34)
Albumin: 3.7 g/dL (ref 3.5–5.0)
Alkaline Phosphatase: 41 U/L (ref 40–150)
BUN: 16.6 mg/dL (ref 7.0–26.0)
CALCIUM: 9.4 mg/dL (ref 8.4–10.4)
CHLORIDE: 106 meq/L (ref 98–109)
CO2: 28 mEq/L (ref 22–29)
Creatinine: 0.8 mg/dL (ref 0.6–1.1)
EGFR: 83 mL/min/{1.73_m2} — ABNORMAL LOW (ref >90)
Glucose: 113 mg/dl (ref 70–140)
POTASSIUM: 3.8 meq/L (ref 3.5–5.1)
Sodium: 143 mEq/L (ref 136–145)
Total Bilirubin: <0.3 mg/dL (ref 0.20–1.20)
Total Protein: 6.6 g/dL (ref 6.4–8.3)

## 2016-05-25 LAB — CBC WITH DIFFERENTIAL/PLATELET
BASO%: 0.4 % (ref 0.0–2.0)
Basophils Absolute: 0.1 10*3/uL (ref 0.0–0.1)
EOS%: 0.6 % (ref 0.0–7.0)
Eosinophils Absolute: 0.2 10*3/uL (ref 0.0–0.5)
HCT: 39.2 % (ref 34.8–46.6)
HGB: 12.5 g/dL (ref 11.6–15.9)
LYMPH#: 31.4 10*3/uL — AB (ref 0.9–3.3)
LYMPH%: 85.7 % — ABNORMAL HIGH (ref 14.0–49.7)
MCH: 30 pg (ref 25.1–34.0)
MCHC: 32 g/dL (ref 31.5–36.0)
MCV: 93.9 fL (ref 79.5–101.0)
MONO#: 1.1 10*3/uL — ABNORMAL HIGH (ref 0.1–0.9)
MONO%: 3.1 % (ref 0.0–14.0)
NEUT#: 3.7 10*3/uL (ref 1.5–6.5)
NEUT%: 10.2 % — ABNORMAL LOW (ref 38.4–76.8)
Platelets: 212 10*3/uL (ref 145–400)
RBC: 4.18 10*6/uL (ref 3.70–5.45)
RDW: 13.7 % (ref 11.2–14.5)
WBC: 36.6 10*3/uL — AB (ref 3.9–10.3)

## 2016-05-25 LAB — TECHNOLOGIST REVIEW

## 2016-05-25 LAB — LACTATE DEHYDROGENASE: LDH: 235 U/L (ref 125–245)

## 2016-05-25 NOTE — Progress Notes (Signed)
Patient Care Team: Carol Ada, MD as PCP - General (Family Medicine)  DIAGNOSIS: Newly diagnosed CLL and prior history of LCIS  CHIEF COMPLIANT: Newly diagnosed CLL and prior history of LCIS  CHIEF COMPLIANT: Follow-up after recent blood work showing CLL  INTERVAL HISTORY: Colleen Shannon is a 60 year old with above-mentioned history of LCIS on tamoxifen therapy presented to our office with leukocytosis and workup of that with flow cytometry revealed that this was a B-cell CLL. She is accompanied by her husband today and is here to discuss the report. She has noticed swollen left-sided neck glands that were attributed to upper respiratory infection that she recently had. These appear to be getting smaller and going away. She recently has had low-grade temperatures but that could be related to the upper respiratory illness. She denies any weight loss. Denies any fatigue. Because of tamoxifen she does have hot flashes.  REVIEW OF SYSTEMS:   Constitutional: Denies fevers, chills or abnormal weight loss Eyes: Denies blurriness of vision Ears, nose, mouth, throat, and face: Denies mucositis or sore throat Respiratory: Denies cough, dyspnea or wheezes Cardiovascular: Denies palpitation, chest discomfort Gastrointestinal:  Denies nausea, heartburn or change in bowel habits Skin: Denies abnormal skin rashes Lymphatics: Denies new lymphadenopathy or easy bruising Neurological:Denies numbness, tingling or new weaknesses Behavioral/Psych: Mood is stable, no new changes  Extremities: No lower extremity edema All other systems were reviewed with the patient and are negative.  I have reviewed the past medical history, past surgical history, social history and family history with the patient and they are unchanged from previous note.  ALLERGIES:  has No Known Allergies.  MEDICATIONS:  Current Outpatient Prescriptions  Medication Sig Dispense Refill  . acetaminophen (TYLENOL) 325 MG tablet  Take 650 mg by mouth once as needed for mild pain or headache.    Marland Kitchen amLODipine (NORVASC) 5 MG tablet TAKE 1 TABLET BY MOUTH AT BEDTIME 90 tablet 3  . aspirin EC 81 MG tablet Take 81 mg by mouth every morning.    Marland Kitchen atorvastatin (LIPITOR) 40 MG tablet TAKE 1 TABLET (40 MG TOTAL) BY MOUTH DAILY. NEED OV. 90 tablet 3  . Calcium Carbonate-Vit D-Min (CALCIUM 1200 PO) Take 1 tablet by mouth every morning.    . carvedilol (COREG) 6.25 MG tablet Take 6.25 mg by mouth 2 (two) times daily with a meal.    . cholecalciferol (VITAMIN D) 1000 UNITS tablet Take 1,000 Units by mouth every morning.    Marland Kitchen glucosamine-chondroitin 500-400 MG tablet Take 1 tablet by mouth 3 (three) times daily.    Marland Kitchen PROAIR HFA 108 (90 BASE) MCG/ACT inhaler 1-2 puffs every 4 (four) hours as needed.   0  . tamoxifen (NOLVADEX) 20 MG tablet TAKE 1 TABLET BY MOUTH EVERY DAY 90 tablet 3   No current facility-administered medications for this visit.     PHYSICAL EXAMINATION: ECOG PERFORMANCE STATUS: 1 - Symptomatic but completely ambulatory  Vitals:   05/25/16 1517  BP: (!) 127/55  Pulse: 81  Resp: 18  Temp: 98.3 F (36.8 C)   Filed Weights   05/25/16 1517  Weight: 150 lb 4.8 oz (68.2 kg)    GENERAL:alert, no distress and comfortable SKIN: skin color, texture, turgor are normal, no rashes or significant lesions EYES: normal, Conjunctiva are pink and non-injected, sclera clear OROPHARYNX:no exudate, no erythema and lips, buccal mucosa, and tongue normal  NECK: supple, thyroid normal size, non-tender, without nodularity LYMPH:  no palpable lymphadenopathy in the cervical, axillary or inguinal LUNGS:  clear to auscultation and percussion with normal breathing effort HEART: regular rate & rhythm and no murmurs and no lower extremity edema ABDOMEN:abdomen soft, non-tender and normal bowel sounds MUSCULOSKELETAL:no cyanosis of digits and no clubbing  NEURO: alert & oriented x 3 with fluent speech, no focal motor/sensory  deficits EXTREMITIES: No lower extremity edema  LABORATORY DATA:  I have reviewed the data as listed   Chemistry      Component Value Date/Time   NA 143 02/08/2016 0927   K 4.0 02/08/2016 0927   CL 105 08/19/2015 0857   CO2 28 02/08/2016 0927   BUN 15.0 02/08/2016 0927   CREATININE 0.8 02/08/2016 0927      Component Value Date/Time   CALCIUM 9.3 02/08/2016 0927   ALKPHOS 31 (L) 02/08/2016 0927   AST 19 02/08/2016 0927   ALT 19 02/08/2016 0927   BILITOT 0.35 02/08/2016 0927      Lab Results  Component Value Date   WBC 36.6 (H) 05/25/2016   HGB 12.5 05/25/2016   HCT 39.2 05/25/2016   MCV 93.9 05/25/2016   PLT 212 05/25/2016   NEUTROABS 3.7 05/25/2016   ASSESSMENT & PLAN:  Chronic lymphocytic leukemia (CLL), B-cell (HCC) Lymphocytosis with cervical lymphadenopathy, flow cytometry revealed B-cell CLL CD5 and CD23 positive WBC: 36.6 ALC 31.4 Hb: 12.5 Pl: 212 Left cervical lymphadenopathy Staging of CLL: I suspect that the patient has stage I CLL She does not have any specific B symptoms.  Indications for treatment of CLL: Patient does not have any indications to treat urgently. In general indications for treatment include 1. B symptoms 2. Stage III/IV 3. Rapid doubling time  Prognosis of CLL: FISH panel revealed + 12 and -13 Q  Which are indicative of favorable prognosis.  Neoplasm of left breast, primary tumor staging category Tis: lobular carcinoma in situ (LCIS) Left breast LCIS status post lumpectomy 10/06/2014; Started tamoxifen for breast cancer risk reduction 20 mg daily 11/18/2014 , plan is to treat for 5 years   Tamoxifen toxicities:  1. Hot flashes 6-8 per day : patient does not think they're significant because she works in the [place] where it is extremely cold and it does not seem to bother her too much. It wakes her up once or twice at night.  2. Denies any myalgias.   Return to clinic in 6 months for follow-up  No orders of the defined types  were placed in this encounter.  The patient has a good understanding of the overall plan. she agrees with it. she will call with any problems that may develop before the next visit here.   Rulon Eisenmenger, MD 05/25/16

## 2016-05-25 NOTE — Assessment & Plan Note (Signed)
Lymphocytosis with cervical lymphadenopathy, flow cytometry revealed B-cell CLL CD5 and CD23 positive ALC 20.4 Hb: 12.8 Pl: 197 Left cervical lymphadenopathy Staging of CLL: I suspect that the patient has stage I CLL She does not have any specific B symptoms.  Indications for treatment of CLL: Patient does not have any indications to treat urgently. In general indications for treatment include 1. B symptoms 2. Stage III/IV 3. Rapid doubling time  Prognosis of CLL

## 2016-05-25 NOTE — Telephone Encounter (Signed)
appt made and avs printed °

## 2016-05-25 NOTE — Assessment & Plan Note (Signed)
Left breast LCIS status post lumpectomy 10/06/2014; Started tamoxifen for breast cancer risk reduction 20 mg daily 11/18/2014 , plan is to treat for 5 years   Tamoxifen toxicities:  1. Hot flashes 6-8 per day : patient does not think they're significant because she works in the [place] where it is extremely cold and it does not seem to bother her too much. It wakes her up once or twice at night.  2. Denies any myalgias.    Return to clinic in 6 months for follow-up

## 2016-05-26 ENCOUNTER — Telehealth: Payer: Self-pay | Admitting: Hematology and Oncology

## 2016-05-26 NOTE — Telephone Encounter (Signed)
08/24/16 Appointment rescheduled per patient request to [id number] due to conflict in schedule and time convenience

## 2016-06-13 ENCOUNTER — Other Ambulatory Visit: Payer: Self-pay | Admitting: Cardiovascular Disease

## 2016-06-22 DIAGNOSIS — M1712 Unilateral primary osteoarthritis, left knee: Secondary | ICD-10-CM | POA: Diagnosis not present

## 2016-06-23 ENCOUNTER — Other Ambulatory Visit: Payer: Self-pay | Admitting: Orthopedic Surgery

## 2016-06-23 DIAGNOSIS — M1712 Unilateral primary osteoarthritis, left knee: Secondary | ICD-10-CM

## 2016-06-26 ENCOUNTER — Ambulatory Visit
Admission: RE | Admit: 2016-06-26 | Discharge: 2016-06-26 | Disposition: A | Discharge status: Home / Self Care | Payer: BLUE CROSS/BLUE SHIELD | Source: Ambulatory Visit | Attending: Orthopedic Surgery | Admitting: Orthopedic Surgery

## 2016-06-26 DIAGNOSIS — M179 Osteoarthritis of knee, unspecified: Secondary | ICD-10-CM | POA: Diagnosis not present

## 2016-06-26 DIAGNOSIS — M1712 Unilateral primary osteoarthritis, left knee: Secondary | ICD-10-CM

## 2016-07-07 ENCOUNTER — Ambulatory Visit: Payer: BLUE CROSS/BLUE SHIELD | Admitting: Hematology and Oncology

## 2016-08-03 DIAGNOSIS — M25862 Other specified joint disorders, left knee: Secondary | ICD-10-CM | POA: Diagnosis not present

## 2016-08-03 DIAGNOSIS — Z01810 Encounter for preprocedural cardiovascular examination: Secondary | ICD-10-CM | POA: Diagnosis not present

## 2016-08-11 ENCOUNTER — Other Ambulatory Visit: Payer: Self-pay | Admitting: Cardiovascular Disease

## 2016-08-20 NOTE — Assessment & Plan Note (Signed)
Lymphocytosis with cervical lymphadenopathy, flow cytometry revealed B-cell CLL CD5 and CD23 positive WBC: 36.6 ALC 31.4 Hb: 12.5 Pl: 212 Left cervical lymphadenopathy Staging of CLL: I suspect that the patient has stage I CLL She does not have any specific B symptoms.  Indications for treatment of CLL: Patient does not have any indications to treat urgently. In general indications for treatment include 1. B symptoms 2. Stage III/IV 3. Rapid doubling time  Prognosis of CLL: FISH panel revealed + 12 and -13 Q  Which are indicative of favorable prognosis.  Neoplasm of left breast, primary tumor staging category Tis: lobular carcinoma in situ (LCIS) Left breast LCIS status post lumpectomy 10/06/2014; Started tamoxifen for breast cancer risk reduction 20 mg daily 11/18/2014 , plan is to treat for 5 years   Tamoxifen toxicities:  1. Hot flashes 6-8 per day : patient does not think they're significant because she works in the [place] where it is extremely cold and it does not seem to bother her too much. It wakes her up once or twice at night.  2. Denies any myalgias.   Return to clinic in 6 months for follow-up and after that in 1 year

## 2016-08-21 ENCOUNTER — Other Ambulatory Visit (HOSPITAL_BASED_OUTPATIENT_CLINIC_OR_DEPARTMENT_OTHER): Payer: BLUE CROSS/BLUE SHIELD

## 2016-08-21 ENCOUNTER — Encounter: Payer: Self-pay | Admitting: Hematology and Oncology

## 2016-08-21 ENCOUNTER — Telehealth: Payer: Self-pay

## 2016-08-21 ENCOUNTER — Ambulatory Visit (HOSPITAL_BASED_OUTPATIENT_CLINIC_OR_DEPARTMENT_OTHER): Payer: BLUE CROSS/BLUE SHIELD | Admitting: Hematology and Oncology

## 2016-08-21 VITALS — BP 128/57 | HR 69 | Temp 98.5°F | Resp 18 | Ht 61.5 in | Wt 150.1 lb

## 2016-08-21 DIAGNOSIS — D0502 Lobular carcinoma in situ of left breast: Secondary | ICD-10-CM

## 2016-08-21 DIAGNOSIS — Z79810 Long term (current) use of selective estrogen receptor modulators (SERMs): Secondary | ICD-10-CM

## 2016-08-21 DIAGNOSIS — Z170 Estrogen receptor positive status [ER+]: Secondary | ICD-10-CM

## 2016-08-21 DIAGNOSIS — C9110 Chronic lymphocytic leukemia of B-cell type not having achieved remission: Secondary | ICD-10-CM

## 2016-08-21 LAB — CBC WITH DIFFERENTIAL/PLATELET
BASO%: 0.3 % (ref 0.0–2.0)
BASOS ABS: 0.1 10*3/uL (ref 0.0–0.1)
EOS ABS: 0.3 10*3/uL (ref 0.0–0.5)
EOS%: 1.1 % (ref 0.0–7.0)
HCT: 39.4 % (ref 34.8–46.6)
HEMOGLOBIN: 13 g/dL (ref 11.6–15.9)
LYMPH%: 81.2 % — ABNORMAL HIGH (ref 14.0–49.7)
MCH: 31 pg (ref 25.1–34.0)
MCHC: 33 g/dL (ref 31.5–36.0)
MCV: 94 fL (ref 79.5–101.0)
MONO#: 1.2 10*3/uL — AB (ref 0.1–0.9)
MONO%: 5 % (ref 0.0–14.0)
NEUT%: 12.4 % — ABNORMAL LOW (ref 38.4–76.8)
NEUTROS ABS: 3.1 10*3/uL (ref 1.5–6.5)
PLATELETS: 183 10*3/uL (ref 145–400)
RBC: 4.19 10*6/uL (ref 3.70–5.45)
RDW: 12.9 % (ref 11.2–14.5)
WBC: 24.9 10*3/uL — AB (ref 3.9–10.3)
lymph#: 20.2 10*3/uL — ABNORMAL HIGH (ref 0.9–3.3)

## 2016-08-21 LAB — COMPREHENSIVE METABOLIC PANEL
ALBUMIN: 3.5 g/dL (ref 3.5–5.0)
ALK PHOS: 41 U/L (ref 40–150)
ALT: 18 U/L (ref 0–55)
ANION GAP: 10 meq/L (ref 3–11)
AST: 19 U/L (ref 5–34)
BILIRUBIN TOTAL: 0.37 mg/dL (ref 0.20–1.20)
BUN: 12.5 mg/dL (ref 7.0–26.0)
CALCIUM: 9.2 mg/dL (ref 8.4–10.4)
CO2: 26 mEq/L (ref 22–29)
Chloride: 108 mEq/L (ref 98–109)
Creatinine: 0.7 mg/dL (ref 0.6–1.1)
EGFR: 89 mL/min/{1.73_m2} — AB (ref >90)
GLUCOSE: 135 mg/dL (ref 70–140)
POTASSIUM: 3.7 meq/L (ref 3.5–5.1)
Sodium: 144 mEq/L (ref 136–145)
TOTAL PROTEIN: 6.4 g/dL (ref 6.4–8.3)

## 2016-08-21 LAB — TECHNOLOGIST REVIEW

## 2016-08-21 LAB — LACTATE DEHYDROGENASE: LDH: 239 U/L (ref 125–245)

## 2016-08-21 NOTE — Telephone Encounter (Signed)
Returned pt call regarding lab work from today after seeing dr. Lindi Adie. Pt to have knee surgery and will be seen at [facility] tomorrow at 11:15. Would like to have labs sent to ortho office so that she doesn't need to have duplicate lab tomorrow. Faxed over to Dr. Gabriel Carina per pt request. Pt appreciative of this request.

## 2016-08-21 NOTE — Progress Notes (Signed)
Patient Care Team: Carol Ada, MD as PCP - General (Family Medicine)  DIAGNOSIS:  Encounter Diagnoses  Name Primary?  . Neoplasm of left breast, primary tumor staging category Tis: lobular carcinoma in situ (LCIS) Yes  . Chronic lymphocytic leukemia of B-cell type not having achieved remission (HCC)    CHIEF COMPLIANT: Follow-up of CLL and LCIS on tamoxifen  INTERVAL HISTORY: Colleen Shannon is a 60 year old with above-mentioned history of CLL who had a rapid rise in the lymphocyte count last time and she is here for recheck of her lymphocyte counts today. She tells me that her right knee is bothering her and she is expecting to undergo knee replacement surgery. After this she may be off work for 3 months. She continues to have hot flashes related to tamoxifen. But she is able to tolerate and manage his symptoms fairly well.  REVIEW OF SYSTEMS:   Constitutional: Denies fevers, chills or abnormal weight loss Eyes: Denies blurriness of vision Ears, nose, mouth, throat, and face: Denies mucositis or sore throat Respiratory: Denies cough, dyspnea or wheezes Cardiovascular: Denies palpitation, chest discomfort Gastrointestinal:  Denies nausea, heartburn or change in bowel habits Skin: Denies abnormal skin rashes Lymphatics: Denies new lymphadenopathy or easy bruising Neurological:Denies numbness, tingling or new weaknesses Behavioral/Psych: Mood is stable, no new changes  Extremities: No lower extremity edema Breast:  denies any pain or lumps or nodules in either breasts All other systems were reviewed with the patient and are negative.  I have reviewed the past medical history, past surgical history, social history and family history with the patient and they are unchanged from previous note.  ALLERGIES:  has No Known Allergies.  MEDICATIONS:  Current Outpatient Prescriptions  Medication Sig Dispense Refill  . acetaminophen (TYLENOL) 325 MG tablet Take 650 mg by mouth once as  needed for mild pain or headache.    Marland Kitchen amLODipine (NORVASC) 5 MG tablet Take 1 tablet (5 mg total) by mouth at bedtime. Please call the office for additional refills [phone] 90 tablet 0  . aspirin EC 81 MG tablet Take 81 mg by mouth every morning.    Marland Kitchen atorvastatin (LIPITOR) 40 MG tablet TAKE 1 TABLET (40 MG TOTAL) BY MOUTH DAILY. NEED OV. 90 tablet 3  . Calcium Carbonate-Vit D-Min (CALCIUM 1200 PO) Take 1 tablet by mouth every morning.    . carvedilol (COREG) 6.25 MG tablet Take 6.25 mg by mouth 2 (two) times daily with a meal.    . carvedilol (COREG) 6.25 MG tablet TAKE 1 TABLET BY MOUTH WITH MEALS TWICE A DAY 180 tablet 3  . cholecalciferol (VITAMIN D) 1000 UNITS tablet Take 1,000 Units by mouth every morning.    Marland Kitchen glucosamine-chondroitin 500-400 MG tablet Take 1 tablet by mouth 3 (three) times daily.    Marland Kitchen PROAIR HFA 108 (90 BASE) MCG/ACT inhaler 1-2 puffs every 4 (four) hours as needed.   0  . tamoxifen (NOLVADEX) 20 MG tablet TAKE 1 TABLET BY MOUTH EVERY DAY 90 tablet 3   No current facility-administered medications for this visit.     PHYSICAL EXAMINATION: ECOG PERFORMANCE STATUS: 1 - Symptomatic but completely ambulatory  Vitals:   08/21/16 1142  BP: (!) 128/57  Pulse: 69  Resp: 18  Temp: 98.5 F (36.9 C)   Filed Weights   08/21/16 1142  Weight: 150 lb 1.6 oz (68.1 kg)    GENERAL:alert, no distress and comfortable SKIN: skin color, texture, turgor are normal, no rashes or significant lesions EYES: normal, Conjunctiva  are pink and non-injected, sclera clear OROPHARYNX:no exudate, no erythema and lips, buccal mucosa, and tongue normal  NECK: supple, thyroid normal size, non-tender, without nodularity LYMPH:  no palpable lymphadenopathy in the cervical, axillary or inguinal LUNGS: clear to auscultation and percussion with normal breathing effort HEART: regular rate & rhythm and no murmurs and no lower extremity edema ABDOMEN:abdomen soft, non-tender and normal bowel  sounds MUSCULOSKELETAL:no cyanosis of digits and no clubbing  NEURO: alert & oriented x 3 with fluent speech, no focal motor/sensory deficits EXTREMITIES: No lower extremity edema  LABORATORY DATA:  I have reviewed the data as listed   Chemistry      Component Value Date/Time   NA 144 08/21/2016 1103   K 3.7 08/21/2016 1103   CL 105 08/19/2015 0857   CO2 26 08/21/2016 1103   BUN 12.5 08/21/2016 1103   CREATININE 0.7 08/21/2016 1103      Component Value Date/Time   CALCIUM 9.2 08/21/2016 1103   ALKPHOS 41 08/21/2016 1103   AST 19 08/21/2016 1103   ALT 18 08/21/2016 1103   BILITOT 0.37 08/21/2016 1103      Lab Results  Component Value Date   WBC 24.9 (H) 08/21/2016   HGB 13.0 08/21/2016   HCT 39.4 08/21/2016   MCV 94.0 08/21/2016   PLT 183 08/21/2016   NEUTROABS 3.1 08/21/2016   ASSESSMENT & PLAN:  Chronic lymphocytic leukemia (CLL), B-cell (HCC) Lymphocytosis with cervical lymphadenopathy, flow cytometry revealed B-cell CLL CD5 and CD23 positive WBC: 24.9 ALC 20 Hb: 13 Pl: 183 Left cervical lymphadenopathy Staging of CLL: I suspect that the patient has stage I CLL She does not have any specific B symptoms.  Indications for treatment of CLL: Patient does not have any indications to treat urgently. In general indications for treatment include 1. B symptoms 2. Stage III/IV 3. Rapid doubling time  Prognosis of CLL: FISH panel revealed + 12 and -13 Q  Which are indicative of favorable prognosis.  Neoplasm of left breast, primary tumor staging category Tis: lobular carcinoma in situ (LCIS) Left breast LCIS status post lumpectomy 10/06/2014; Started tamoxifen for breast cancer risk reduction 20 mg daily 11/18/2014 , plan is to treat for 5 years   Tamoxifen toxicities:  1. Hot flashes 6-8 per day : patient does not think they're significant because she works in the [employer] where it is extremely cold and it does not seem to bother her too much. It wakes her up once or  twice at night.  2. Denies any myalgias.   Return to clinic in 6 months for follow-up and after that in 1 year  No orders of the defined types were placed in this encounter.  The patient has a good understanding of the overall plan. she agrees with it. she will call with any problems that may develop before the next visit here.   Rulon Eisenmenger, MD 08/21/16

## 2016-08-22 DIAGNOSIS — M1712 Unilateral primary osteoarthritis, left knee: Secondary | ICD-10-CM | POA: Diagnosis not present

## 2016-08-24 ENCOUNTER — Ambulatory Visit: Payer: BLUE CROSS/BLUE SHIELD | Admitting: Hematology and Oncology

## 2016-08-24 ENCOUNTER — Other Ambulatory Visit: Payer: BLUE CROSS/BLUE SHIELD

## 2016-08-25 ENCOUNTER — Encounter: Payer: Self-pay | Admitting: Hematology and Oncology

## 2016-09-05 DIAGNOSIS — Z96651 Presence of right artificial knee joint: Secondary | ICD-10-CM | POA: Diagnosis not present

## 2016-09-05 DIAGNOSIS — M1712 Unilateral primary osteoarthritis, left knee: Secondary | ICD-10-CM | POA: Diagnosis not present

## 2016-09-15 ENCOUNTER — Other Ambulatory Visit: Payer: Self-pay | Admitting: Cardiovascular Disease

## 2016-09-25 ENCOUNTER — Ambulatory Visit: Payer: BLUE CROSS/BLUE SHIELD | Admitting: Cardiovascular Disease

## 2016-09-25 DIAGNOSIS — Z13820 Encounter for screening for osteoporosis: Secondary | ICD-10-CM | POA: Diagnosis not present

## 2016-09-26 DIAGNOSIS — R922 Inconclusive mammogram: Secondary | ICD-10-CM | POA: Diagnosis not present

## 2016-09-28 DIAGNOSIS — D2261 Melanocytic nevi of right upper limb, including shoulder: Secondary | ICD-10-CM | POA: Diagnosis not present

## 2016-09-28 DIAGNOSIS — L570 Actinic keratosis: Secondary | ICD-10-CM | POA: Diagnosis not present

## 2016-09-28 DIAGNOSIS — D2262 Melanocytic nevi of left upper limb, including shoulder: Secondary | ICD-10-CM | POA: Diagnosis not present

## 2016-09-28 DIAGNOSIS — D1801 Hemangioma of skin and subcutaneous tissue: Secondary | ICD-10-CM | POA: Diagnosis not present

## 2016-09-28 DIAGNOSIS — L814 Other melanin hyperpigmentation: Secondary | ICD-10-CM | POA: Diagnosis not present

## 2016-10-10 DIAGNOSIS — Z471 Aftercare following joint replacement surgery: Secondary | ICD-10-CM | POA: Diagnosis not present

## 2016-10-10 DIAGNOSIS — Z96652 Presence of left artificial knee joint: Secondary | ICD-10-CM | POA: Diagnosis not present

## 2016-10-17 ENCOUNTER — Other Ambulatory Visit: Payer: Self-pay | Admitting: Cardiovascular Disease

## 2016-11-08 ENCOUNTER — Other Ambulatory Visit: Payer: Self-pay

## 2016-11-08 MED ORDER — AMLODIPINE BESYLATE 5 MG PO TABS: 5.0000 mg | ORAL_TABLET | Freq: Every day | ORAL | 0 refills | Status: DC

## 2016-11-09 ENCOUNTER — Other Ambulatory Visit: Payer: Self-pay | Admitting: *Deleted

## 2016-11-09 NOTE — Telephone Encounter (Signed)
Left message  - patient needs appointment to be schedule before a 90 day supply  prescription refilled on 11/08/16

## 2017-01-23 DIAGNOSIS — Z96651 Presence of right artificial knee joint: Secondary | ICD-10-CM | POA: Diagnosis not present

## 2017-01-23 DIAGNOSIS — Z471 Aftercare following joint replacement surgery: Secondary | ICD-10-CM | POA: Diagnosis not present

## 2017-02-01 DIAGNOSIS — E7800 Pure hypercholesterolemia, unspecified: Secondary | ICD-10-CM | POA: Diagnosis not present

## 2017-02-01 DIAGNOSIS — Z0000 Encounter for general adult medical examination without abnormal findings: Secondary | ICD-10-CM | POA: Diagnosis not present

## 2017-02-01 DIAGNOSIS — I10 Essential (primary) hypertension: Secondary | ICD-10-CM | POA: Diagnosis not present

## 2017-02-01 DIAGNOSIS — C9110 Chronic lymphocytic leukemia of B-cell type not having achieved remission: Secondary | ICD-10-CM | POA: Diagnosis not present

## 2017-02-01 DIAGNOSIS — M25562 Pain in left knee: Secondary | ICD-10-CM | POA: Diagnosis not present

## 2017-02-16 DIAGNOSIS — S8391XD Sprain of unspecified site of right knee, subsequent encounter: Secondary | ICD-10-CM | POA: Diagnosis not present

## 2017-02-16 DIAGNOSIS — Z471 Aftercare following joint replacement surgery: Secondary | ICD-10-CM | POA: Diagnosis not present

## 2017-02-16 DIAGNOSIS — Z96651 Presence of right artificial knee joint: Secondary | ICD-10-CM | POA: Diagnosis not present

## 2017-02-17 NOTE — Assessment & Plan Note (Signed)
Left breast LCIS status post lumpectomy 10/06/2014; Started tamoxifen for breast cancer risk reduction 20 mg daily 11/18/2014 , plan is to treat for 5 years   Tamoxifen toxicities:  1. Hot flashes 6-8 per day : patient does not think they're significant because she works in the [place] where it is extremely cold and it does not seem to bother her too much. It wakes her up once or twice at night.  2. Denies any myalgias.   Return to clinic in one year for follow-up

## 2017-02-17 NOTE — Assessment & Plan Note (Signed)
Lymphocytosis with cervical lymphadenopathy, flow cytometry revealed B-cell CLL CD5 and CD23 positive WBC: 24.9 ALC 20 Hb: 13 Pl: 183 Left cervical lymphadenopathy Staging of CLL: I suspect that the patient has stage I CLL She does not have any specific B symptoms.  Indications for treatment of CLL: Patient does not have any indications to treat urgently. In general indications for treatment include 1. B symptoms 2. Stage III/IV 3. Rapid doubling time  Prognosis of CLL: FISH panel revealed + 12 and -13 Q Which are indicative of favorable prognosis.

## 2017-02-19 ENCOUNTER — Encounter: Payer: Self-pay | Admitting: Hematology and Oncology

## 2017-02-19 ENCOUNTER — Ambulatory Visit (HOSPITAL_BASED_OUTPATIENT_CLINIC_OR_DEPARTMENT_OTHER): Payer: BLUE CROSS/BLUE SHIELD | Admitting: Hematology and Oncology

## 2017-02-19 ENCOUNTER — Other Ambulatory Visit (HOSPITAL_BASED_OUTPATIENT_CLINIC_OR_DEPARTMENT_OTHER): Payer: BLUE CROSS/BLUE SHIELD

## 2017-02-19 DIAGNOSIS — Z79810 Long term (current) use of selective estrogen receptor modulators (SERMs): Secondary | ICD-10-CM | POA: Diagnosis not present

## 2017-02-19 DIAGNOSIS — D0502 Lobular carcinoma in situ of left breast: Secondary | ICD-10-CM | POA: Diagnosis not present

## 2017-02-19 DIAGNOSIS — C9110 Chronic lymphocytic leukemia of B-cell type not having achieved remission: Secondary | ICD-10-CM

## 2017-02-19 DIAGNOSIS — S8391XA Sprain of unspecified site of right knee, initial encounter: Secondary | ICD-10-CM | POA: Diagnosis not present

## 2017-02-19 DIAGNOSIS — Z170 Estrogen receptor positive status [ER+]: Secondary | ICD-10-CM

## 2017-02-19 LAB — COMPREHENSIVE METABOLIC PANEL
ALT: 19 U/L (ref 0–55)
AST: 20 U/L (ref 5–34)
Albumin: 3.9 g/dL (ref 3.5–5.0)
Alkaline Phosphatase: 36 U/L — ABNORMAL LOW (ref 40–150)
Anion Gap: 10 mEq/L (ref 3–11)
BUN: 15.7 mg/dL (ref 7.0–26.0)
CO2: 26 meq/L (ref 22–29)
Calcium: 9.1 mg/dL (ref 8.4–10.4)
Chloride: 107 mEq/L (ref 98–109)
Creatinine: 0.8 mg/dL (ref 0.6–1.1)
EGFR: 82 mL/min/{1.73_m2} — AB (ref >90)
GLUCOSE: 136 mg/dL (ref 70–140)
POTASSIUM: 4 meq/L (ref 3.5–5.1)
SODIUM: 143 meq/L (ref 136–145)
TOTAL PROTEIN: 6.3 g/dL — AB (ref 6.4–8.3)
Total Bilirubin: 0.36 mg/dL (ref 0.20–1.20)

## 2017-02-19 LAB — CBC WITH DIFFERENTIAL/PLATELET
BASO%: 0.4 % (ref 0.0–2.0)
Basophils Absolute: 0.1 10*3/uL (ref 0.0–0.1)
EOS ABS: 0.1 10*3/uL (ref 0.0–0.5)
EOS%: 0.6 % (ref 0.0–7.0)
HCT: 38.4 % (ref 34.8–46.6)
HEMOGLOBIN: 12.7 g/dL (ref 11.6–15.9)
LYMPH%: 84.6 % — AB (ref 14.0–49.7)
MCH: 31.2 pg (ref 25.1–34.0)
MCHC: 33.1 g/dL (ref 31.5–36.0)
MCV: 94.3 fL (ref 79.5–101.0)
MONO#: 0.5 10*3/uL (ref 0.1–0.9)
MONO%: 2.2 % (ref 0.0–14.0)
NEUT%: 12.2 % — ABNORMAL LOW (ref 38.4–76.8)
NEUTROS ABS: 2.7 10*3/uL (ref 1.5–6.5)
Platelets: 171 10*3/uL (ref 145–400)
RBC: 4.07 10*6/uL (ref 3.70–5.45)
RDW: 13.3 % (ref 11.2–14.5)
WBC: 22 10*3/uL — AB (ref 3.9–10.3)
lymph#: 18.6 10*3/uL — ABNORMAL HIGH (ref 0.9–3.3)

## 2017-02-19 LAB — LACTATE DEHYDROGENASE: LDH: 232 U/L (ref 125–245)

## 2017-02-19 LAB — TECHNOLOGIST REVIEW

## 2017-02-19 NOTE — Progress Notes (Signed)
Patient Care Team: Carol Ada, MD as PCP - General (Family Medicine)  DIAGNOSIS:  Encounter Diagnoses  Name Primary?  . Chronic lymphocytic leukemia of B-cell type not having achieved remission ([place])   . Neoplasm of left breast, primary tumor staging category Tis: lobular carcinoma in situ (LCIS)     CHIEF COMPLIANT: Follow-up of LCIS and CLL  INTERVAL HISTORY: Colleen Shannon is a 61 year old with above-mentioned history of LCIS for which she is currently on tamoxifen therapy. She continues to have moderate hot flashes but she is able to manage it fairly well. She denies any myalgias or arthralgias. She does not have any symptoms related to CLL at this point. She denies any night sweats or enlarged lymph nodes are weight loss issues. 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 She stays very busy at work.  REVIEW OF SYSTEMS:   Constitutional: Denies fevers, chills or abnormal weight loss Eyes: Denies blurriness of vision Ears, nose, mouth, throat, and face: Denies mucositis or sore throat Respiratory: Denies cough, dyspnea or wheezes Cardiovascular: Denies palpitation, chest discomfort Gastrointestinal:  Denies nausea, heartburn or change in bowel habits Skin: Denies abnormal skin rashes Lymphatics: Denies new lymphadenopathy or easy bruising Neurological:Denies numbness, tingling or new weaknesses Behavioral/Psych: Mood is stable, no new changes  Extremities: No lower extremity edema Breast:  denies any pain or lumps or nodules in either breasts All other systems were reviewed with the patient and are negative.  I have reviewed the past medical history, past surgical history, social history and family history with the patient and they are unchanged from previous note.  ALLERGIES:  has No Known Allergies.  MEDICATIONS:  Current Outpatient Prescriptions  Medication Sig Dispense Refill  . acetaminophen (TYLENOL) 325 MG tablet Take 650 mg by mouth once as needed for  mild pain or headache.    Marland Kitchen amLODipine (NORVASC) 5 MG tablet Take 1 tablet (5 mg total) by mouth at bedtime. PLEASE MAKE APPOINTMENT FOR FURTHER REFILLS ,2nd attempt 30 tablet 0  . aspirin EC 81 MG tablet Take 81 mg by mouth every morning.    Marland Kitchen atorvastatin (LIPITOR) 40 MG tablet TAKE 1 TABLET (40 MG TOTAL) BY MOUTH DAILY. NEED OV. 90 tablet 3  . Calcium Carbonate-Vit D-Min (CALCIUM 1200 PO) Take 1 tablet by mouth every morning.    . carvedilol (COREG) 6.25 MG tablet TAKE 1 TABLET BY MOUTH WITH MEALS TWICE A DAY 180 tablet 3  . cholecalciferol (VITAMIN D) 1000 UNITS tablet Take 1,000 Units by mouth every morning.    Marland Kitchen glucosamine-chondroitin 500-400 MG tablet Take 1 tablet by mouth 3 (three) times daily.    Marland Kitchen PROAIR HFA 108 (90 BASE) MCG/ACT inhaler 1-2 puffs every 4 (four) hours as needed.   0  . tamoxifen (NOLVADEX) 20 MG tablet TAKE 1 TABLET BY MOUTH EVERY DAY 90 tablet 3  . TRAMADOL HCL PO Take by mouth as needed.     No current facility-administered medications for this visit.     PHYSICAL EXAMINATION: ECOG PERFORMANCE STATUS: 1 - Symptomatic but completely ambulatory  Vitals:   02/19/17 0854  BP: (!) 118/52  Pulse: 72  Resp: 18  Temp: 98.6 F (37 C)   Filed Weights   02/19/17 0854  Weight: 145 lb 6.4 oz (66 kg)    GENERAL:alert, no distress and comfortable SKIN: skin color, texture, turgor are normal, no rashes or significant lesions EYES: normal, Conjunctiva are pink and non-injected, sclera clear OROPHARYNX:no exudate, no erythema and lips,  buccal mucosa, and tongue normal  NECK: supple, thyroid normal size, non-tender, without nodularity LYMPH:  no palpable lymphadenopathy in the cervical, axillary or inguinal LUNGS: clear to auscultation and percussion with normal breathing effort HEART: regular rate & rhythm and no murmurs and no lower extremity edema ABDOMEN:abdomen soft, non-tender and normal bowel sounds MUSCULOSKELETAL:no cyanosis of digits and no clubbing    NEURO: alert & oriented x 3 with fluent speech, no focal motor/sensory deficits EXTREMITIES: No lower extremity edema BREAST: No palpable masses or nodules in either right or left breasts. No palpable axillary supraclavicular or infraclavicular adenopathy no breast tenderness or nipple discharge. (exam performed in the presence of a chaperone)  LABORATORY DATA:  I have reviewed the data as listed   Chemistry      Component Value Date/Time   NA 144 08/21/2016 1103   K 3.7 08/21/2016 1103   CL 105 08/19/2015 0857   CO2 26 08/21/2016 1103   BUN 12.5 08/21/2016 1103   CREATININE 0.7 08/21/2016 1103      Component Value Date/Time   CALCIUM 9.2 08/21/2016 1103   ALKPHOS 41 08/21/2016 1103   AST 19 08/21/2016 1103   ALT 18 08/21/2016 1103   BILITOT 0.37 08/21/2016 1103       Lab Results  Component Value Date   WBC 22.0 (H) 02/19/2017   HGB 12.7 02/19/2017   HCT 38.4 02/19/2017   MCV 94.3 02/19/2017   PLT 171 02/19/2017   NEUTROABS 2.7 02/19/2017    ASSESSMENT & PLAN:  Chronic lymphocytic leukemia (CLL), B-cell (HCC) Lymphocytosis with cervical lymphadenopathy, flow cytometry revealed B-cell CLL CD5 and CD23 positive WBC: 22 ALC 18.6 Hb: 12.7 Pl: 171 Left cervical lymphadenopathy Staging of CLL: I suspect that the patient has stage I CLL She does not have any specific B symptoms.  Indications for treatment of CLL: Patient does not have any indications to treat urgently. In general indications for treatment include 1. B symptoms 2. Stage III/IV 3. Rapid doubling time  Prognosis of CLL: FISH panel revealed + 12 and -13 Q Which are indicative of favorable prognosis.  Neoplasm of left breast, primary tumor staging category Tis: lobular carcinoma in situ (LCIS) Left breast LCIS status post lumpectomy 10/06/2014; Started tamoxifen for breast cancer risk reduction 20 mg daily 11/18/2014 , plan is to treat for 5 years   Tamoxifen toxicities:  1. Hot flashes 6-8 per  day : patient does not think they're significant because she works in the [employer] where it is extremely cold and it does not seem to bother her too much. It wakes her up once or twice at night.  2. Patient complains of diffuse myalgias and stiffness. She is using to [place] as well as doing yoga. Recently she had a left knee partial replacement and she is doing much better with that. She still has stiffness and achiness in the right knee from prior knee replacement surgery. In spite of all of these, she is willing to continue with tamoxifen therapy for the next 2 and half years.  Return to clinic in 6 months for follow-up and if she continues to show improvement we may see her once a year.  I spent 25 minutes talking to the patient of which more than half was spent in counseling and coordination of care.  Orders Placed This Encounter  Procedures  . CBC with Differential/Platelet    Standing Status:   Future    Standing Expiration Date:   03/26/2018  . Comprehensive metabolic  panel    Standing Status:   Future    Standing Expiration Date:   02/19/2018  . Lactate dehydrogenase (LDH)    Standing Status:   Future    Standing Expiration Date:   02/19/2018   The patient has a good understanding of the overall plan. she agrees with it. she will call with any problems that may develop before the next visit here.   Rulon Eisenmenger, MD 02/19/17

## 2017-02-20 DIAGNOSIS — S8391XA Sprain of unspecified site of right knee, initial encounter: Secondary | ICD-10-CM | POA: Diagnosis not present

## 2017-03-15 DIAGNOSIS — R590 Localized enlarged lymph nodes: Secondary | ICD-10-CM | POA: Diagnosis not present

## 2017-03-15 DIAGNOSIS — C9110 Chronic lymphocytic leukemia of B-cell type not having achieved remission: Secondary | ICD-10-CM | POA: Diagnosis not present

## 2017-03-15 DIAGNOSIS — R509 Fever, unspecified: Secondary | ICD-10-CM | POA: Diagnosis not present

## 2017-03-22 ENCOUNTER — Other Ambulatory Visit: Payer: Self-pay

## 2017-03-22 ENCOUNTER — Other Ambulatory Visit: Payer: Self-pay | Admitting: Hematology and Oncology

## 2017-04-02 ENCOUNTER — Other Ambulatory Visit: Payer: Self-pay | Admitting: *Deleted

## 2017-04-02 ENCOUNTER — Telehealth: Payer: Self-pay | Admitting: *Deleted

## 2017-04-02 MED ORDER — AZITHROMYCIN 250 MG PO TABS: ORAL_TABLET | ORAL | 0 refills | Status: DC

## 2017-04-02 NOTE — Telephone Encounter (Signed)
Message left by pt stating she has had an ongoing cold with congestion - " Dr Lindi Adie informed me due to my CLL if this happened again and due to last time it developed into bronchitis he would call me in an antibiotic "  Per MD - Z pak appropriate.  escribed to verified pharmacy.

## 2017-04-02 NOTE — Telephone Encounter (Signed)
"  I have CLL.  Have a cough with a cold which always leads to bronchitis.  Dr.Gudena has said he'll order an antibiotic if this happens.  Please send to [facility] on [address]."  Return number [phone].  Assessment with return call: "Cold has subsided.  Had started 7 to ten days ago.  Had a sore throat, low grade temperature 99.3 to 99.9.  Have not had a cold in over a year.  Colds become bronchitis with the CLL.  Have rattle, wheezing with yellow productive cough.  Trying to drink fluids, Mucinex, pro-air inhaler and Advil.  Sensation of fluid and tightness in my chest.  If he needs to see me, I can come in tomorrow morning.  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

## 2017-04-16 DIAGNOSIS — Z1212 Encounter for screening for malignant neoplasm of rectum: Secondary | ICD-10-CM | POA: Diagnosis not present

## 2017-04-16 DIAGNOSIS — Z6827 Body mass index (BMI) 27.0-27.9, adult: Secondary | ICD-10-CM | POA: Diagnosis not present

## 2017-04-16 DIAGNOSIS — Z01419 Encounter for gynecological examination (general) (routine) without abnormal findings: Secondary | ICD-10-CM | POA: Diagnosis not present

## 2017-05-11 DIAGNOSIS — Z23 Encounter for immunization: Secondary | ICD-10-CM | POA: Diagnosis not present

## 2017-05-24 DIAGNOSIS — Z23 Encounter for immunization: Secondary | ICD-10-CM | POA: Diagnosis not present

## 2017-07-27 NOTE — Telephone Encounter (Signed)
No entry 

## 2017-08-09 ENCOUNTER — Other Ambulatory Visit (HOSPITAL_BASED_OUTPATIENT_CLINIC_OR_DEPARTMENT_OTHER): Payer: BLUE CROSS/BLUE SHIELD

## 2017-08-09 ENCOUNTER — Ambulatory Visit (HOSPITAL_COMMUNITY)
Admission: RE | Admit: 2017-08-09 | Discharge: 2017-08-09 | Disposition: A | Discharge status: Home / Self Care | Payer: BLUE CROSS/BLUE SHIELD | Source: Ambulatory Visit | Attending: Hematology and Oncology | Admitting: Hematology and Oncology

## 2017-08-09 ENCOUNTER — Other Ambulatory Visit: Payer: Self-pay

## 2017-08-09 ENCOUNTER — Telehealth: Payer: Self-pay

## 2017-08-09 ENCOUNTER — Ambulatory Visit (HOSPITAL_BASED_OUTPATIENT_CLINIC_OR_DEPARTMENT_OTHER): Payer: BLUE CROSS/BLUE SHIELD | Admitting: Medical

## 2017-08-09 VITALS — BP 134/59 | HR 96 | Temp 99.8°F | Resp 20 | Ht 61.5 in | Wt 147.3 lb

## 2017-08-09 DIAGNOSIS — C9110 Chronic lymphocytic leukemia of B-cell type not having achieved remission: Secondary | ICD-10-CM | POA: Diagnosis not present

## 2017-08-09 DIAGNOSIS — J209 Acute bronchitis, unspecified: Secondary | ICD-10-CM

## 2017-08-09 DIAGNOSIS — R05 Cough: Secondary | ICD-10-CM | POA: Diagnosis not present

## 2017-08-09 DIAGNOSIS — R918 Other nonspecific abnormal finding of lung field: Secondary | ICD-10-CM | POA: Insufficient documentation

## 2017-08-09 LAB — CBC WITH DIFFERENTIAL/PLATELET
BASO%: 0.6 % (ref 0.0–2.0)
BASOS ABS: 0.2 10*3/uL — AB (ref 0.0–0.1)
EOS%: 0.2 % (ref 0.0–7.0)
Eosinophils Absolute: 0.1 10*3/uL (ref 0.0–0.5)
HEMATOCRIT: 37.4 % (ref 34.8–46.6)
HGB: 12.2 g/dL (ref 11.6–15.9)
LYMPH#: 27.6 10*3/uL — AB (ref 0.9–3.3)
LYMPH%: 67.5 % — AB (ref 14.0–49.7)
MCH: 30.9 pg (ref 25.1–34.0)
MCHC: 32.6 g/dL (ref 31.5–36.0)
MCV: 94.6 fL (ref 79.5–101.0)
MONO#: 1.8 10*3/uL — ABNORMAL HIGH (ref 0.1–0.9)
MONO%: 4.3 % (ref 0.0–14.0)
NEUT#: 11.2 10*3/uL — ABNORMAL HIGH (ref 1.5–6.5)
NEUT%: 27.4 % — AB (ref 38.4–76.8)
Platelets: 182 10*3/uL (ref 145–400)
RBC: 3.96 10*6/uL (ref 3.70–5.45)
RDW: 13.5 % (ref 11.2–14.5)
WBC: 40.8 10*3/uL — ABNORMAL HIGH (ref 3.9–10.3)

## 2017-08-09 LAB — BASIC METABOLIC PANEL
ANION GAP: 9 meq/L (ref 3–11)
BUN: 10.9 mg/dL (ref 7.0–26.0)
CALCIUM: 8.9 mg/dL (ref 8.4–10.4)
CO2: 24 mEq/L (ref 22–29)
Chloride: 105 mEq/L (ref 98–109)
Creatinine: 0.7 mg/dL (ref 0.6–1.1)
GLUCOSE: 132 mg/dL (ref 70–140)
POTASSIUM: 3.7 meq/L (ref 3.5–5.1)
Sodium: 139 mEq/L (ref 136–145)

## 2017-08-09 LAB — TECHNOLOGIST REVIEW

## 2017-08-09 LAB — LACTATE DEHYDROGENASE: LDH: 273 U/L — ABNORMAL HIGH (ref 125–245)

## 2017-08-09 MED ORDER — AZITHROMYCIN 250 MG PO TABS: ORAL_TABLET | ORAL | 0 refills | Status: DC

## 2017-08-09 NOTE — Progress Notes (Signed)
Symptoms Management Clinic Progress Note   Colleen Shannon [id number] [birth year] 61 y.o.  Colleen Shannon is managed by Dr. Nicholas Lose  Actively treated with chemotherapy: no   Assessment: Plan:    Acute bronchitis, unspecified organism - Plan: azithromycin (ZITHROMAX Z-PAK) 250 MG tablet  Chronic lymphocytic leukemia of B-cell type not having achieved remission ([place])   Acute bronchitis: Patient is given a prescription for a Z-Pak. 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 She was told to push fluids, rest, and use Tylenol as needed.  Chronic lymphocytic leukemia, B-cell type not having achieved remission: The patient's CBC returned with a WBC elevated at 40.8. Of note, the patient's differential returned with neutrophils at 27.4% which is up from 12.2 percent at her last visit. Lymphocytes returned at 67.5% which is down from 84.6% at her last visit. I attempted to reassure the patient that her higher white blood count could in part be related to an active infection given the fact that her neutrophil percentage was higher today than at her last visit. She will follow up with Dr. Nicholas Lose as scheduled on 08/20/2017.  Please see After Visit Summary for patient specific instructions.  Future Appointments Date Time Provider [place]  08/20/2017 8:15 AM [facility] 1 [facility] None  08/20/2017 8:45 AM Nicholas Lose, MD [facility] None  11/08/2017 8:00 AM Troy Sine, MD [facility]    No orders of the defined types were placed in this encounter.      Subjective:   Patient ID:  Colleen Shannon is a 61 y.o. (DOB 1956-09-24) female.  Chief Complaint:  Chief Complaint  Patient presents with  . Fever    HPI Colleen Shannon is a 61 year old female with a history of a CD5 and CD23 positive chronic lymphocytic leukemia with lymphocytosis and cervical lymphadenopathy. The patient is managed by Dr. Nicholas Lose and was last  seen on 02/19/2017 at which time her WBC returning at 24.9 with an absolute lymphocyte count of 20. The patient's hemoglobin was 13 and platelet count was 183. Dr. Domenick Gong estimation was that the patient was a stage I CLL. The patient continues to have left cervical lymphadenopathy. He did not believe that the patient had in the indication is to treat her urgently. His plan was to see her in 6 months for follow-up. He stated that if she continued to show improvement he would consider seeing her in one year. The patient called this morning stating that she had had upper respiratory symptoms for around one month and was now having temperatures which range from 101-103. She reported having chest congestion for the last 3 weeks with a productive cough with yellowish sputum. Showed a fever last evening but took Tylenol during the night.  She has noted increased lymphadenopathy in her left neck which is better today. A chest x-ray completed today returned showing chronic interstitial changes without acute abnormalities. A chemistry panel returned within normal limits. An LDH was elevated at 273. A CBC returned with a WBC of 10.8 with an ANC of 11.2. The patient's differential showed neutrophils higher at 27.4% up from 12.2% at her last visit and lymphocyte at 67.5% down from 84.6% at her last visit. The patient continues to work as an Scientist, research (life sciences). She did not work today. She has a follow-up appointment with Dr. Lindi Adie scheduled for 08/20/2017. She reports that she has had a history of recurrent bronchitis.  Medications: I have reviewed the patient's current medications.  Allergies: No Known Allergies  Past Medical History:  Diagnosis Date  . Arthritis   . Cancer (HCC)    hx of skin cancer   . Elevated cholesterol 05/22/12   Nuc stress test-Normal. The patiet had a markedly positive GXT for ischemia. post stress EF 72%  . Endometrial polyp   . Hypertension 05/22/12   ECHO-EF >55% trace  tricupsid regurgitation. There is mild mitral regurgitation.     Past Surgical History:  Procedure Laterality Date  . ABDOMINAL SURGERY     Abdominoplasty  . CESAREAN SECTION     X 2  . CHOLECYSTECTOMY    . COLONSCOPY    . DILATION AND CURETTAGE OF UTERUS    . FOOT SURGERY    . HYSTEROSCOPY    . KNEE CLOSED REDUCTION Right 07/20/2014   Procedure: RIGHT KNEE CLOSED MANIPULATION;  Surgeon: [name], MD;  Location: [facility];  Service: Orthopedics;  Laterality: Right;  . KNEE SURGERY     arthroscopic  . RADIOACTIVE SEED GUIDED EXCISIONAL BREAST BIOPSY N/A 10/06/2014   Procedure: RADIOACTIVE SEED GUIDED EXCISIONAL BREAST BIOPSY;  Surgeon: Rolm Bookbinder, MD;  Location: [facility];  Service: General;  Laterality: N/A;  . TOTAL KNEE ARTHROPLASTY Right 05/11/2014   Procedure: RIGHT TOTAL KNEE ARTHROPLASTY;  Surgeon: [name], MD;  Location: [facility];  Service: Orthopedics;  Laterality: Right;  . TUBAL LIGATION      Family History  Problem Relation Age of Onset  . Heart disease Father   . Aneurysm Mother   . Hyperlipidemia Mother   . Hypertension Brother   . Cancer - Other Paternal Grandmother        uterine  . Hypertension Paternal Grandfather   . Stroke Paternal Grandfather   . Heart disease Maternal Grandfather   . Cancer - Other Maternal Grandfather        lung    Social History   Social History  . Marital status: Married    Spouse name: N/A  . Number of children: N/A  . Years of education: N/A   Occupational History  . Not on file.   Social History Main Topics  . Smoking status: Never Smoker  . Smokeless tobacco: Never Used  . Alcohol use 0.5 oz/week    1 Standard drinks or equivalent per week     Comment: occasional   . Drug use: No  . Sexual activity: Yes    Birth control/ protection: Surgical, Post-menopausal   Other Topics Concern  . Not on file   Social History Narrative  . No narrative on file    Past Medical History,  Surgical history, Social history, and Family history were reviewed and updated as appropriate.   Please see review of systems for further details on the patient's review from today.   Review of Systems:  Review of Systems  Constitutional: Positive for fever. Negative for chills and diaphoresis.  HENT: Negative for postnasal drip, rhinorrhea, sinus pain, sinus pressure and sneezing.   Respiratory: Positive for cough. Negative for chest tightness, shortness of breath and wheezing.   Gastrointestinal: Negative for constipation, diarrhea, nausea and vomiting.    Objective:   Physical Exam:  BP (!) 134/59 (BP Location: Left Arm, Patient Position: Sitting)   Pulse 96   Temp 99.8 F (37.7 C) (Oral)   Resp 20   Ht 5' 1.5" (1.562 m)   Wt 147 lb 4.8 oz (66.8 kg)  LMP 11/19/2011   SpO2 98%   BMI 27.38 kg/m  ECOG: 0  Physical Exam  Constitutional: No distress.  HENT:  Head: Normocephalic and atraumatic.  Right Ear: External ear normal.  Left Ear: External ear normal.  Mouth/Throat: Oropharynx is clear and moist. No oropharyngeal exudate.  Eyes: Right eye exhibits no discharge. Left eye exhibits no discharge. No scleral icterus.  Neck: Normal range of motion. Neck supple.  Cardiovascular: Normal rate and regular rhythm.  Exam reveals no gallop and no friction rub.   No murmur heard. Pulmonary/Chest: Effort normal and breath sounds normal. No respiratory distress. She has no wheezes. She has no rales. She exhibits no tenderness.  Lymphadenopathy:       Head (right side): No submandibular, no preauricular and no posterior auricular adenopathy present.       Head (left side): No submandibular, no preauricular and no posterior auricular adenopathy present.    She has cervical adenopathy.       Right cervical: No superficial cervical, no deep cervical and no posterior cervical adenopathy present.      Left cervical: Superficial cervical and deep cervical adenopathy present.    She has  axillary adenopathy.       Right axillary: No pectoral and no lateral adenopathy present.       Left axillary: Lateral adenopathy present. No pectoral adenopathy present.      Right: No supraclavicular and no epitrochlear adenopathy present.       Left: Supraclavicular adenopathy present. No epitrochlear adenopathy present.  Neurological: She is alert. Coordination normal.  Skin: Skin is warm and dry. No rash noted. She is not diaphoretic. No erythema.    Lab Review:     Component Value Date/Time   NA 139 08/09/2017 1150   K 3.7 08/09/2017 1150   CL 105 08/19/2015 0857   CO2 24 08/09/2017 1150   GLUCOSE 132 08/09/2017 1150   BUN 10.9 08/09/2017 1150   CREATININE 0.7 08/09/2017 1150   CALCIUM 8.9 08/09/2017 1150   PROT 6.3 (L) 02/19/2017 0837   ALBUMIN 3.9 02/19/2017 0837   AST 20 02/19/2017 0837   ALT 19 02/19/2017 0837   ALKPHOS 36 (L) 02/19/2017 0837   BILITOT 0.36 02/19/2017 0837   GFRNONAA >90 09/30/2014 1200   GFRAA >90 09/30/2014 1200       Component Value Date/Time   WBC 40.8 (H) 08/09/2017 1150   WBC 17.3 (H) 08/19/2015 0857   RBC 3.96 08/09/2017 1150   RBC 4.36 08/19/2015 0857   HGB 12.2 08/09/2017 1150   HCT 37.4 08/09/2017 1150   PLT 182 08/09/2017 1150   MCV 94.6 08/09/2017 1150   MCH 30.9 08/09/2017 1150   MCH 30.5 08/19/2015 0857   MCHC 32.6 08/09/2017 1150   MCHC 33.2 08/19/2015 0857   RDW 13.5 08/09/2017 1150   LYMPHSABS 27.6 (H) 08/09/2017 1150   MONOABS 1.8 (H) 08/09/2017 1150   EOSABS 0.1 08/09/2017 1150   BASOSABS 0.2 (H) 08/09/2017 1150   -------------------------------  Imaging from last 24 hours (if applicable):  Radiology interpretation: Dg Chest 2 View  Result Date: 08/09/2017 CLINICAL DATA:  Chest congestion and cough EXAM: CHEST  2 VIEW COMPARISON:  08/27/2014 FINDINGS: Cardiac shadow is stable. The lungs are well aerated bilaterally. Very mild interstitial changes are again seen and stable. No focal confluent infiltrate is seen. No  effusion is noted. No bony abnormality is seen. IMPRESSION: Chronic interstitial changes without acute abnormality. Electronically Signed   By: Linus Mako.D.  On: 08/09/2017 11:41

## 2017-08-09 NOTE — Telephone Encounter (Signed)
Returned VM regarding patient having a cold for a month and now having temperatures ranging 101-103 and having chest congestions. Spoke with Colleen Maize, RN in symptom management, patient will see Colleen Mealy, PA today. Labs and cxray ordered. Patient aware of appointments. Cyndia Bent RN

## 2017-08-20 ENCOUNTER — Ambulatory Visit (HOSPITAL_BASED_OUTPATIENT_CLINIC_OR_DEPARTMENT_OTHER): Payer: BLUE CROSS/BLUE SHIELD | Admitting: Hematology and Oncology

## 2017-08-20 ENCOUNTER — Other Ambulatory Visit (HOSPITAL_BASED_OUTPATIENT_CLINIC_OR_DEPARTMENT_OTHER): Payer: BLUE CROSS/BLUE SHIELD

## 2017-08-20 ENCOUNTER — Telehealth: Payer: Self-pay | Admitting: Hematology and Oncology

## 2017-08-20 DIAGNOSIS — D0502 Lobular carcinoma in situ of left breast: Secondary | ICD-10-CM

## 2017-08-20 DIAGNOSIS — J069 Acute upper respiratory infection, unspecified: Secondary | ICD-10-CM

## 2017-08-20 DIAGNOSIS — C9110 Chronic lymphocytic leukemia of B-cell type not having achieved remission: Secondary | ICD-10-CM

## 2017-08-20 DIAGNOSIS — Z170 Estrogen receptor positive status [ER+]: Secondary | ICD-10-CM

## 2017-08-20 DIAGNOSIS — Z79810 Long term (current) use of selective estrogen receptor modulators (SERMs): Secondary | ICD-10-CM

## 2017-08-20 LAB — CBC WITH DIFFERENTIAL/PLATELET
BASO%: 0.2 % (ref 0.0–2.0)
BASOS ABS: 0.1 10*3/uL (ref 0.0–0.1)
EOS ABS: 0.2 10*3/uL (ref 0.0–0.5)
EOS%: 0.7 % (ref 0.0–7.0)
HEMATOCRIT: 38.3 % (ref 34.8–46.6)
HGB: 12.4 g/dL (ref 11.6–15.9)
LYMPH#: 27.1 10*3/uL — AB (ref 0.9–3.3)
LYMPH%: 85.3 % — ABNORMAL HIGH (ref 14.0–49.7)
MCH: 31.2 pg (ref 25.1–34.0)
MCHC: 32.4 g/dL (ref 31.5–36.0)
MCV: 96.5 fL (ref 79.5–101.0)
MONO#: 1.3 10*3/uL — AB (ref 0.1–0.9)
MONO%: 4.1 % (ref 0.0–14.0)
NEUT#: 3.1 10*3/uL (ref 1.5–6.5)
NEUT%: 9.7 % — AB (ref 38.4–76.8)
PLATELETS: 225 10*3/uL (ref 145–400)
RBC: 3.97 10*6/uL (ref 3.70–5.45)
RDW: 12.9 % (ref 11.2–14.5)
WBC: 31.8 10*3/uL — ABNORMAL HIGH (ref 3.9–10.3)

## 2017-08-20 LAB — COMPREHENSIVE METABOLIC PANEL
ALK PHOS: 42 U/L (ref 40–150)
ALT: 18 U/L (ref 0–55)
AST: 19 U/L (ref 5–34)
Albumin: 3.6 g/dL (ref 3.5–5.0)
Anion Gap: 8 mEq/L (ref 3–11)
BUN: 11.9 mg/dL (ref 7.0–26.0)
CHLORIDE: 107 meq/L (ref 98–109)
CO2: 28 meq/L (ref 22–29)
Calcium: 9.2 mg/dL (ref 8.4–10.4)
Creatinine: 0.8 mg/dL (ref 0.6–1.1)
GLUCOSE: 125 mg/dL (ref 70–140)
POTASSIUM: 4.5 meq/L (ref 3.5–5.1)
SODIUM: 143 meq/L (ref 136–145)
Total Bilirubin: 0.31 mg/dL (ref 0.20–1.20)
Total Protein: 6.3 g/dL — ABNORMAL LOW (ref 6.4–8.3)

## 2017-08-20 LAB — TECHNOLOGIST REVIEW

## 2017-08-20 MED ORDER — LEVOFLOXACIN 750 MG PO TABS: 750.0000 mg | ORAL_TABLET | Freq: Every day | ORAL | 0 refills | Status: DC

## 2017-08-20 NOTE — Assessment & Plan Note (Signed)
Left breast LCIS status post lumpectomy 10/06/2014; Started tamoxifen for breast cancer risk reduction 20 mg daily 11/18/2014 , plan is to treat for 5 years   Tamoxifen toxicities:  1. Hot flashes 6-8 per day : patient does not think they're significant because she works in the [place] where it is extremely cold and it does not seem to bother her too much. It wakes her up once or twice at night.  2. Patient complains of diffuse myalgias and stiffness  Return to clinic in 1 year

## 2017-08-20 NOTE — Telephone Encounter (Signed)
Gave patient avs and calendar with appt per 11/12 los.  °

## 2017-08-20 NOTE — Assessment & Plan Note (Signed)
Lymphocytosis with cervical lymphadenopathy, flow cytometry revealed B-cell CLL CD5 and CD23 positive WBC: 22 ALC 18.6 Hb: 12.7 Pl: 171 Left cervical lymphadenopathy Staging of CLL: I suspect that the patient has stage I CLL She does not have any specific B symptoms.  Indications for treatment of CLL: Patient does not have any indications to treat urgently. In general indications for treatment include 1. B symptoms 2. Stage III/IV 3. Rapid doubling time  Prognosis of CLL: FISH panel revealed + 12 and -13 Q Which are indicative of favorable prognosis.

## 2017-08-20 NOTE — Progress Notes (Signed)
Patient Care Team: Carol Ada, MD as PCP - General (Family Medicine)  DIAGNOSIS:  Encounter Diagnoses  Name Primary?  . Chronic lymphocytic leukemia of B-cell type not having achieved remission ([place])   . Neoplasm of left breast, primary tumor staging category Tis: lobular carcinoma in situ (LCIS)     SUMMARY OF ONCOLOGIC HISTORY:   Chronic lymphocytic leukemia (CLL), B-cell ([place])   02/08/2016 Initial Diagnosis    Chronic lymphocytic leukemia (CLL), B-cell ([place])       CHIEF COMPLIANT: Severe URI  INTERVAL HISTORY: Colleen Shannon is a 61 year old with above-mentioned history of CLL and LCIS.  She had upper respiratory infection symptoms and is here for a one-week follow-up after being treated with azithromycin.  She tells me that the fevers went away but upper respiratory symptoms have gotten worse.  She is not able to breathe or work.  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  REVIEW OF SYSTEMS:   Constitutional: Denies fevers, chills or abnormal weight loss Eyes: Denies blurriness of vision Ears, nose, mouth, throat, and face: Denies mucositis or sore throat Respiratory: Upper respiratory infection Cardiovascular: Denies palpitation, chest discomfort Gastrointestinal:  Denies nausea, heartburn or change in bowel habits Skin: Denies abnormal skin rashes Lymphatics: Denies new lymphadenopathy or easy bruising Neurological:Denies numbness, tingling or new weaknesses Behavioral/Psych: Mood is stable, no new changes  Extremities: No lower extremity edema  All other systems were reviewed with the patient and are negative.  I have reviewed the past medical history, past surgical history, social history and family history with the patient and they are unchanged from previous note.  ALLERGIES:  has No Known Allergies.  MEDICATIONS:  Current Outpatient Medications  Medication Sig Dispense Refill  . acetaminophen (TYLENOL) 325 MG tablet Take 650 mg by mouth once as needed  for mild pain or headache.    Marland Kitchen amLODipine (NORVASC) 5 MG tablet Take 1 tablet (5 mg total) by mouth at bedtime. PLEASE MAKE APPOINTMENT FOR FURTHER REFILLS ,2nd attempt 30 tablet 0  . aspirin EC 81 MG tablet Take 81 mg by mouth every morning.    Marland Kitchen atorvastatin (LIPITOR) 40 MG tablet TAKE 1 TABLET (40 MG TOTAL) BY MOUTH DAILY. NEED OV. 90 tablet 3  . Calcium Carbonate-Vit D-Min (CALCIUM 1200 PO) Take 1 tablet by mouth every morning.    . carvedilol (COREG) 6.25 MG tablet TAKE 1 TABLET BY MOUTH WITH MEALS TWICE A DAY 180 tablet 3  . cholecalciferol (VITAMIN D) 1000 UNITS tablet Take 1,000 Units by mouth every morning.    Marland Kitchen levofloxacin (LEVAQUIN) 750 MG tablet Take 1 tablet (750 mg total) daily by mouth. 7 tablet 0  . PROAIR HFA 108 (90 BASE) MCG/ACT inhaler 1-2 puffs every 4 (four) hours as needed.   0  . tamoxifen (NOLVADEX) 20 MG tablet TAKE 1 TABLET BY MOUTH EVERY DAY 90 tablet 3  . TRAMADOL HCL PO Take by mouth as needed.     No current facility-administered medications for this visit.     PHYSICAL EXAMINATION: ECOG PERFORMANCE STATUS: 1 - Symptomatic but completely ambulatory  Vitals:   08/20/17 0837  BP: (!) 123/59  Pulse: 78  Resp: 20  Temp: 98.5 F (36.9 C)  SpO2: 100%   Filed Weights   08/20/17 0837  Weight: 146 lb 3.2 oz (66.3 kg)    GENERAL:alert, no distress and comfortable SKIN: skin color, texture, turgor are normal, no rashes or significant lesions EYES: normal, Conjunctiva are pink and non-injected, sclera  clear OROPHARYNX:no exudate, no erythema and lips, buccal mucosa, and tongue normal  NECK: supple, thyroid normal size, non-tender, without nodularity LYMPH:  no palpable lymphadenopathy in the cervical, axillary or inguinal LUNGS: clear to auscultation and percussion with normal breathing effort HEART: regular rate & rhythm and no murmurs and no lower extremity edema ABDOMEN:abdomen soft, non-tender and normal bowel sounds MUSCULOSKELETAL:no cyanosis of  digits and no clubbing  NEURO: alert & oriented x 3 with fluent speech, no focal motor/sensory deficits EXTREMITIES: No lower extremity edema   LABORATORY DATA:  I have reviewed the data as listed   Chemistry      Component Value Date/Time   NA 139 08/09/2017 1150   K 3.7 08/09/2017 1150   CL 105 08/19/2015 0857   CO2 24 08/09/2017 1150   BUN 10.9 08/09/2017 1150   CREATININE 0.7 08/09/2017 1150      Component Value Date/Time   CALCIUM 8.9 08/09/2017 1150   ALKPHOS 36 (L) 02/19/2017 0837   AST 20 02/19/2017 0837   ALT 19 02/19/2017 0837   BILITOT 0.36 02/19/2017 0837       Lab Results  Component Value Date   WBC 31.8 (H) 08/20/2017   HGB 12.4 08/20/2017   HCT 38.3 08/20/2017   MCV 96.5 08/20/2017   PLT 225 08/20/2017   NEUTROABS 3.1 08/20/2017    ASSESSMENT & PLAN:  Chronic lymphocytic leukemia (CLL), B-cell (HCC) Lymphocytosis with cervical lymphadenopathy, flow cytometry revealed B-cell CLL CD5 and CD23 positive WBC: 31.8 ALC  27 Hb: 12.4 Pl: 225 Left cervical lymphadenopathy Staging of CLL: I suspect that the patient has stage I CLL She does not have any specific B symptoms.  Indications for treatment of CLL: Patient does not have any indications to treat urgently. In general indications for treatment include 1. B symptoms 2. Stage III/IV 3. Rapid doubling time I discussed with her that the lymphocyte count is slowly increasing over time.  We need to watch it more closer.  Severe upper respiratory infection: I prescribed levofloxacin for her.  Prognosis of CLL: FISH panel revealed + 12 and -13 Q Which are indicative of favorable prognosis.    Neoplasm of left breast, primary tumor staging category Tis: lobular carcinoma in situ (LCIS) Left breast LCIS status post lumpectomy 10/06/2014; Started tamoxifen for breast cancer risk reduction 20 mg daily 11/18/2014 , plan is to treat for 5 years   Tamoxifen toxicities:  1. Hot flashes 6-8 per day :  patient does not think they're significant because she works in the [place] where it is extremely cold and it does not seem to bother her too much. It wakes her up once or twice at night.  2. Patient complains of diffuse myalgias and stiffness  Return to clinic in 3 months with labs and follow-up   I spent 25 minutes talking to the patient of which more than half was spent in counseling and coordination of care.  Orders Placed This Encounter  Procedures  . CBC with Differential    Standing Status:   Future    Standing Expiration Date:   08/20/2018  . Comprehensive metabolic panel    Standing Status:   Future    Standing Expiration Date:   08/20/2018   The patient has a good understanding of the overall plan. she agrees with it. she will call with any problems that may develop before the next visit here.   Rulon Eisenmenger, MD 08/20/17

## 2017-08-22 ENCOUNTER — Ambulatory Visit: Payer: BLUE CROSS/BLUE SHIELD | Admitting: Hematology and Oncology

## 2017-08-22 ENCOUNTER — Other Ambulatory Visit: Payer: BLUE CROSS/BLUE SHIELD

## 2017-09-27 DIAGNOSIS — L814 Other melanin hyperpigmentation: Secondary | ICD-10-CM | POA: Diagnosis not present

## 2017-09-27 DIAGNOSIS — L570 Actinic keratosis: Secondary | ICD-10-CM | POA: Diagnosis not present

## 2017-09-27 DIAGNOSIS — D2262 Melanocytic nevi of left upper limb, including shoulder: Secondary | ICD-10-CM | POA: Diagnosis not present

## 2017-09-27 DIAGNOSIS — H43813 Vitreous degeneration, bilateral: Secondary | ICD-10-CM | POA: Diagnosis not present

## 2017-09-27 DIAGNOSIS — K130 Diseases of lips: Secondary | ICD-10-CM | POA: Diagnosis not present

## 2017-09-27 DIAGNOSIS — H35422 Microcystoid degeneration of retina, left eye: Secondary | ICD-10-CM | POA: Diagnosis not present

## 2017-09-27 DIAGNOSIS — Z853 Personal history of malignant neoplasm of breast: Secondary | ICD-10-CM | POA: Diagnosis not present

## 2017-09-27 DIAGNOSIS — Z85828 Personal history of other malignant neoplasm of skin: Secondary | ICD-10-CM | POA: Diagnosis not present

## 2017-09-27 DIAGNOSIS — H43392 Other vitreous opacities, left eye: Secondary | ICD-10-CM | POA: Diagnosis not present

## 2017-09-27 DIAGNOSIS — R928 Other abnormal and inconclusive findings on diagnostic imaging of breast: Secondary | ICD-10-CM | POA: Diagnosis not present

## 2017-10-04 DIAGNOSIS — H35422 Microcystoid degeneration of retina, left eye: Secondary | ICD-10-CM | POA: Diagnosis not present

## 2017-10-04 DIAGNOSIS — H43813 Vitreous degeneration, bilateral: Secondary | ICD-10-CM | POA: Diagnosis not present

## 2017-10-04 DIAGNOSIS — H43392 Other vitreous opacities, left eye: Secondary | ICD-10-CM | POA: Diagnosis not present

## 2017-10-25 DIAGNOSIS — H43813 Vitreous degeneration, bilateral: Secondary | ICD-10-CM | POA: Diagnosis not present

## 2017-10-25 DIAGNOSIS — H43392 Other vitreous opacities, left eye: Secondary | ICD-10-CM | POA: Diagnosis not present

## 2017-10-25 DIAGNOSIS — H4312 Vitreous hemorrhage, left eye: Secondary | ICD-10-CM | POA: Diagnosis not present

## 2017-10-31 DIAGNOSIS — Z23 Encounter for immunization: Secondary | ICD-10-CM | POA: Diagnosis not present

## 2017-11-08 ENCOUNTER — Ambulatory Visit (INDEPENDENT_AMBULATORY_CARE_PROVIDER_SITE_OTHER): Payer: BLUE CROSS/BLUE SHIELD | Admitting: Cardiovascular Disease

## 2017-11-08 ENCOUNTER — Encounter: Payer: Self-pay | Admitting: Cardiovascular Disease

## 2017-11-08 ENCOUNTER — Other Ambulatory Visit: Payer: Self-pay | Admitting: *Deleted

## 2017-11-08 VITALS — BP 138/68 | HR 76 | Ht 61.0 in | Wt 148.0 lb

## 2017-11-08 DIAGNOSIS — I10 Essential (primary) hypertension: Secondary | ICD-10-CM

## 2017-11-08 DIAGNOSIS — C9190 Lymphoid leukemia, unspecified not having achieved remission: Secondary | ICD-10-CM | POA: Diagnosis not present

## 2017-11-08 DIAGNOSIS — R002 Palpitations: Secondary | ICD-10-CM | POA: Diagnosis not present

## 2017-11-08 DIAGNOSIS — Z8249 Family history of ischemic heart disease and other diseases of the circulatory system: Secondary | ICD-10-CM | POA: Diagnosis not present

## 2017-11-08 DIAGNOSIS — C9110 Chronic lymphocytic leukemia of B-cell type not having achieved remission: Secondary | ICD-10-CM

## 2017-11-08 MED ORDER — AMLODIPINE BESYLATE 5 MG PO TABS: 5.0000 mg | ORAL_TABLET | Freq: Every day | ORAL | 3 refills | Status: DC

## 2017-11-08 MED ORDER — CARVEDILOL 6.25 MG PO TABS: 9.3750 mg | ORAL_TABLET | Freq: Two times a day (BID) | ORAL | 3 refills | Status: DC

## 2017-11-08 MED ORDER — ATORVASTATIN CALCIUM 40 MG PO TABS: ORAL_TABLET | ORAL | 3 refills | Status: DC

## 2017-11-08 NOTE — Progress Notes (Signed)
Patient ID: Colleen Shannon, female   DOB: 25-Jan-1956, 62 y.o.   MRN: [id number]     HPI: Colleen Shannon is a 62 y.o. female presents to the office today for a 65 month cardiology evaluation.  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  Colleen Shannon has a history of hypertension and previous chest pain for which he was started on carvedilol and initially nifedipine by Dr. Elisabeth Cara with a presumptive dose of possible spasm. In August 2013 a nuclear perfusion study was entirely normal. 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 She has been on lipid-lowering therapy with atorvastatin. When I saw her 2 years ago I discontinued her nifedipine and switched her to amlodipine. She states her blood pressure has been controlled on this regimen. An echo Doppler study in August 2013 showed normal systolic function. She did have mild mitral regurgitation.  Over the past year, she has done well from a cardiac standpoint.  She denies any episodes of chest pain.  He denies palpitations.  She is now followed by Dr. Carol Ada for primary care.  Recent laboratory had been drawn for which she was told was excellent.  Colleen Shannon underwent right knee replacement surgery by Dr. Sandie Ano on 05/11/2014.  She tolerated that well from a cardiovascular standpoint.  Since I last saw her in November 2016, she underwent knee replacement to her left knee in November 2017.  She also was diagnosed as having CLL.  She is followed by Dr. Lindi Adie. .  She continues to take tamoxifen for previous carcinoma in situ on breast biopsy.  Recently, she has noticed palpitations which may occur 4-5 days per week.  These typically are isolated and last for approximately 20 seconds.  She denies any sustained episodes of tachycardia.  She denies chest pain.   Past Medical History:  Diagnosis Date  . Arthritis   . Cancer (HCC)    hx of skin cancer   . Elevated cholesterol 05/22/12   Nuc stress test-Normal. The patiet had a markedly positive GXT for ischemia. post stress EF 72%  . Endometrial polyp   . Hypertension 05/22/12   ECHO-EF >55% trace tricupsid regurgitation. There is mild mitral regurgitation.     Past Surgical History:  Procedure Laterality Date  . ABDOMINAL SURGERY     Abdominoplasty  . CESAREAN SECTION     X 2  . CHOLECYSTECTOMY    . COLONSCOPY    . DILATION AND CURETTAGE OF UTERUS    . FOOT SURGERY    . HYSTEROSCOPY    . KNEE CLOSED REDUCTION Right 07/20/2014   Procedure: RIGHT KNEE CLOSED MANIPULATION;  Surgeon: [name], MD;  Location: [facility];  Service: Orthopedics;  Laterality: Right;  . KNEE SURGERY     arthroscopic  . RADIOACTIVE SEED GUIDED EXCISIONAL BREAST BIOPSY N/A 10/06/2014   Procedure: RADIOACTIVE SEED GUIDED EXCISIONAL BREAST BIOPSY;  Surgeon: Rolm Bookbinder, MD;  Location: [facility];  Service: General;  Laterality: N/A;  . TOTAL KNEE ARTHROPLASTY Right 05/11/2014   Procedure: RIGHT TOTAL KNEE ARTHROPLASTY;  Surgeon: [name], MD;  Location: [facility];  Service: Orthopedics;  Laterality: Right;  . TUBAL LIGATION      No Known Allergies  Current Outpatient Medications  Medication Sig Dispense Refill  . acetaminophen (TYLENOL) 325 MG tablet Take 650 mg by mouth once as needed for  mild pain or headache.    Marland Kitchen amLODipine (NORVASC) 5 MG tablet Take 1 tablet (5 mg total) by mouth at bedtime. PLEASE MAKE APPOINTMENT FOR FURTHER REFILLS ,2nd attempt 30 tablet 0  . Ascorbic Acid (VITAMIN C PO) Take 900 mg by mouth daily.    Marland Kitchen aspirin EC 81 MG tablet Take 81 mg by mouth every morning.    Marland Kitchen atorvastatin (LIPITOR) 40 MG tablet TAKE 1 TABLET (40 MG TOTAL) BY MOUTH DAILY. NEED OV. 90 tablet 3  . Calcium Carb-Cholecalciferol (CALCIUM-VITAMIN D3) 600-400 MG-UNIT TABS Take 1 tablet by mouth 2 (two) times daily.    . carvedilol (COREG) 6.25 MG tablet Take 1.5  tablets (9.375 mg total) by mouth 2 (two) times daily with a meal. 270 tablet 3  . IBUPROFEN PO Take 1 tablet by mouth as needed.    Marland Kitchen NAPROXEN PO Take 1 tablet by mouth as needed.    . Prenatal Multivit-Min-Fe-FA (PRENATAL VITAMINS PO) Take 1 tablet by mouth daily.    Marland Kitchen PROAIR HFA 108 (90 BASE) MCG/ACT inhaler 1-2 puffs every 4 (four) hours as needed.   0  . tamoxifen (NOLVADEX) 20 MG tablet TAKE 1 TABLET BY MOUTH EVERY DAY 90 tablet 3   No current facility-administered medications for this visit.     Social History   Socioeconomic History  . Marital status: Married    Spouse name: Not on file  . Number of children: Not on file  . Years of education: Not on file  . Highest education level: Not on file  Social Needs  . Financial resource strain: Not on file  . Food insecurity - worry: Not on file  . Food insecurity - inability: Not on file  . Transportation needs - medical: Not on file  . Transportation needs - non-medical: Not on file  Occupational History  . Not on file  Tobacco Use  . Smoking status: Never Smoker  . Smokeless tobacco: Never Used  Substance and Sexual Activity  . Alcohol use: Yes    Alcohol/week: 0.5 oz    Types: 1 Standard drinks or equivalent per week    Comment: occasional   . Drug use: No  . Sexual activity: Yes    Birth control/protection: Surgical, Post-menopausal  Other Topics Concern  . Not on file  Social History Narrative  . Not on file   Additional social history is notable that she is married for 33 years. 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 There is no tobacco use. He does drink occasional wine. .  She is status post bilateral knee replacements.  She has resumed exercise.  Family History  Problem Relation Age of Onset  . Heart disease Father   . Aneurysm Mother   . Hyperlipidemia Mother   . Hypertension Brother   . Cancer - Other Paternal Grandmother        uterine  . Hypertension  Paternal Grandfather   . Stroke Paternal Grandfather   . Heart disease Maternal Grandfather   . Cancer - Other Maternal Grandfather        lung   ROS General: Negative; No fevers, chills, or night sweats;  HEENT: Negative; No changes in vision or hearing, sinus congestion, difficulty swallowing Pulmonary: Negative; No cough, wheezing, shortness of breath, hemoptysis Cardiovascular: Negative; No chest pain, presyncope, syncope, palpitations GI: Negative; No nausea, vomiting, diarrhea, or abdominal pain GU: Negative; No dysuria, hematuria, or difficulty voiding Musculoskeletal: Positive for bilateral knee  replacement surgery;positive for osteoarthritis no myalgias, Hematologic/Oncology: positive for CLL Endocrine: Negative; no heat/cold intolerance; no diabetes Neuro: Negative; no changes in balance, headaches Skin: Negative; No rashes or skin lesions Psychiatric: Negative; No behavioral problems, depression Sleep: Negative; No snoring, daytime sleepiness, hypersomnolence, bruxism, restless legs, hypnogognic hallucinations, no cataplexy Other comprehensive 14 point system review is negative.  PE BP 138/68   Pulse 76   Ht 5' 1"  (1.549 m)   Wt 148 lb (67.1 kg)   LMP 11/19/2011   BMI 27.96 kg/m    Repeat blood pressure by me 138/70 supine and 134/68 standing   Wt Readings from Last 3 Encounters:  11/08/17 148 lb (67.1 kg)  08/20/17 146 lb 3.2 oz (66.3 kg)  08/09/17 147 lb 4.8 oz (66.8 kg)    General: Alert, oriented, no distress.  Skin: normal turgor, no rashes, warm and dry HEENT: Normocephalic, atraumatic. Pupils equal round and reactive to light; sclera anicteric; extraocular muscles intact; Fundi ** Nose without nasal septal hypertrophy Mouth/Parynx benign; Mallinpatti scale Neck: No JVD, no carotid bruits; normal carotid upstroke Lungs: clear to ausculatation and percussion; no wheezing or rales Chest wall: without tenderness to palpitation Left supraclavicular and  infraclavicular lymph node.  Left axillary lymph node Heart: PMI not displaced, RRR, s1 s2 normal, 1/6 systolic murmur, no diastolic murmur, no rubs, gallops, thrills, or heaves Abdomen: soft, nontender; no hepatosplenomehaly, BS+; abdominal aorta nontender and not dilated by palpation. Back: no CVA tenderness Pulses 2+ Musculoskeletal: full range of motion, normal strength, no joint deformities Extremities: no clubbing cyanosis or edema, Homan's sign negative  Neurologic: grossly nonfocal; Cranial nerves grossly wnl Psychologic: Normal mood and affect   ECG (independently read by me): normal sinus rhythm at 76 bpm.  Normal intervals.  No ectopy or ST changes  November 2016 ECG (independently read by me) from 07/17/2014: Normal sinus rhythm.  No ectopy.  Normal intervals.  No ST segment changes.  August 2014 ECG: Normal sinus rhythm at 75 beats per minute without ectopy. Normal intervals.  LABS: BMP Latest Ref Rng & Units 08/20/2017 08/09/2017 02/19/2017  Glucose 70 - 140 mg/dl 125 132 136  BUN 7.0 - 26.0 mg/dL 11.9 10.9 15.7  Creatinine 0.6 - 1.1 mg/dL 0.8 0.7 0.8  Sodium 136 - 145 mEq/L 143 139 143  Potassium 3.5 - 5.1 mEq/L 4.5 3.7 4.0  Chloride 98 - 110 mmol/L - - -  CO2 22 - 29 mEq/L 28 24 26   Calcium 8.4 - 10.4 mg/dL 9.2 8.9 9.1   Hepatic Function Latest Ref Rng & Units 08/20/2017 02/19/2017 08/21/2016  Total Protein 6.4 - 8.3 g/dL 6.3(L) 6.3(L) 6.4  Albumin 3.5 - 5.0 g/dL 3.6 3.9 3.5  AST 5 - 34 U/L 19 20 19   ALT 0 - 55 U/L 18 19 18   Alk Phosphatase 40 - 150 U/L 42 36(L) 41  Total Bilirubin 0.20 - 1.20 mg/dL 0.31 0.36 0.37   CBC Latest Ref Rng & Units 08/20/2017 08/09/2017 02/19/2017  WBC 3.9 - 10.3 10e3/uL 31.8(H) 40.8(H) 22.0(H)  Hemoglobin 11.6 - 15.9 g/dL 12.4 12.2 12.7  Hematocrit 34.8 - 46.6 % 38.3 37.4 38.4  Platelets 145 - 400 10e3/uL 225 182 171   Lab Results  Component Value Date   MCV 96.5 08/20/2017   MCV 94.6 08/09/2017   MCV 94.3 02/19/2017   Lab  Results  Component Value Date   TSH 2.387 08/19/2015   Lipid Panel     Component Value Date/Time   CHOL 167 08/19/2015 0857  TRIG 84 08/19/2015 0857   HDL 73 08/19/2015 0857   CHOLHDL 2.3 08/19/2015 0857   VLDL 17 08/19/2015 0857   LDLCALC 77 08/19/2015 0857    RADIOLOGY: No results found.  IMPRESSION: 1. Essential hypertension   2. Palpitations   3. CLL (chronic lymphocytic leukemia) ([place])   4. Family history of ischemic heart disease     ASSESSMENT AND PLAN: Colleen Shannon has a history of hypertension, and remotely had experienced chest pain.  In August 2013 nuclear perfusion study was normal.  Since I last saw HER-2 years ago, she has continued to be on amlodipine 5 mg in addition to carvedilol 6.25 mg twice a day.  Based on new guidelines, her blood pressure today is mildly elevated.  She has experienced occasional episodes of palpitations which typically occur 4-5 days per week and are short-lived.  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  I am recommending slight titration of carvedilol from 6.25 Mill grams twice a day up to 9.375 mg twice a day.  She will continue with her present dose of amlodipine.  I'm also scheduling her for an echo Doppler study, which will be 5 and half years since her last evaluation.  She has been diagnosed with CLL.  There is lymphadenopathy on examination.  She is followed by Dr. Sonny Dandy.  She states she believes the lymphadenopathy developed [place] after she received her shingles vaccination. Since I last saw her, her brother who is 4 years younger than her recently under went insertion of 4 coronary stents and has a history of recent atrial fibrillation development.  She continues to be on atorvastatin 40 mg for hyperlipidemia.  I reviewed laboratory from April 2018 done at [facility] and her LDL cholesterol was 74 with a total cholesterol 163, HDL 67, and triglycerides 108.  She'll monitor her blood pressure at home and  notices palpitations are improved with increase carvedilol dosing. .  I will contact her regarding her echo Doppler study and  will see her in 6 months for follow up evaluation.  Time spent: 25 minutes Troy Sine, MD, [facility]  11/08/2017 8:41 AM

## 2017-11-08 NOTE — Patient Instructions (Signed)
Medication Instructions:  INCREASE carvedilol (Coreg) to 9.375 mg (1.5 tablets) two times daily  Testing/Procedures: Your physician has requested that you have an echocardiogram. Echocardiography is a painless test that uses sound waves to create images of your heart. It provides your doctor with information about the size and shape of your heart and how well your heart's chambers and valves are working. This procedure takes approximately one hour. There are no restrictions for this procedure.  This will be done at our [facility] location:  [place] wants you to follow-up in: 6 months with Dr. Claiborne Billings. You will receive a reminder letter in the mail two months in advance. If you don't receive a letter, please call our office to schedule the follow-up appointment.   Any Other Special Instructions Will Be Listed Below (If Applicable).     If you need a refill on your cardiac medications before your next appointment, please call your pharmacy.

## 2017-11-09 ENCOUNTER — Telehealth: Payer: Self-pay

## 2017-11-09 NOTE — Telephone Encounter (Signed)
Returned pt call regarding swollen lymph nodes after shingles vaccine. Per Dr. Lindi Adie pt should contact her pcp and patient was instructed to do so. Patient stated if it did not get better over the weekend she would call us next week.  Cyndia Bent RN

## 2017-11-14 ENCOUNTER — Other Ambulatory Visit: Payer: Self-pay

## 2017-11-14 ENCOUNTER — Ambulatory Visit (HOSPITAL_COMMUNITY): Payer: BLUE CROSS/BLUE SHIELD | Attending: Cardiology

## 2017-11-14 DIAGNOSIS — I10 Essential (primary) hypertension: Secondary | ICD-10-CM

## 2017-11-14 DIAGNOSIS — I5030 Unspecified diastolic (congestive) heart failure: Secondary | ICD-10-CM | POA: Diagnosis not present

## 2017-11-14 DIAGNOSIS — I051 Rheumatic mitral insufficiency: Secondary | ICD-10-CM | POA: Insufficient documentation

## 2017-11-20 ENCOUNTER — Ambulatory Visit: Payer: BLUE CROSS/BLUE SHIELD | Admitting: Hematology and Oncology

## 2017-11-20 ENCOUNTER — Other Ambulatory Visit: Payer: BLUE CROSS/BLUE SHIELD

## 2017-11-22 ENCOUNTER — Telehealth: Payer: Self-pay | Admitting: Hematology and Oncology

## 2017-11-22 ENCOUNTER — Inpatient Hospital Stay (HOSPITAL_BASED_OUTPATIENT_CLINIC_OR_DEPARTMENT_OTHER): Payer: BLUE CROSS/BLUE SHIELD | Admitting: Hematology and Oncology

## 2017-11-22 ENCOUNTER — Inpatient Hospital Stay: Payer: BLUE CROSS/BLUE SHIELD | Attending: Hematology and Oncology

## 2017-11-22 DIAGNOSIS — C9110 Chronic lymphocytic leukemia of B-cell type not having achieved remission: Secondary | ICD-10-CM

## 2017-11-22 DIAGNOSIS — D0502 Lobular carcinoma in situ of left breast: Secondary | ICD-10-CM | POA: Insufficient documentation

## 2017-11-22 DIAGNOSIS — Z79810 Long term (current) use of selective estrogen receptor modulators (SERMs): Secondary | ICD-10-CM | POA: Diagnosis not present

## 2017-11-22 LAB — COMPREHENSIVE METABOLIC PANEL
ALT: 18 U/L (ref 0–55)
ANION GAP: 6 (ref 3–11)
AST: 22 U/L (ref 5–34)
Albumin: 3.8 g/dL (ref 3.5–5.0)
Alkaline Phosphatase: 31 U/L — ABNORMAL LOW (ref 40–150)
BUN: 19 mg/dL (ref 7–26)
CHLORIDE: 108 mmol/L (ref 98–109)
CO2: 27 mmol/L (ref 22–29)
CREATININE: 0.77 mg/dL (ref 0.60–1.10)
Calcium: 8.8 mg/dL (ref 8.4–10.4)
Glucose, Bld: 109 mg/dL (ref 70–140)
POTASSIUM: 5.1 mmol/L (ref 3.5–5.1)
Sodium: 141 mmol/L (ref 136–145)
Total Bilirubin: 0.4 mg/dL (ref 0.2–1.2)
Total Protein: 6 g/dL — ABNORMAL LOW (ref 6.4–8.3)

## 2017-11-22 LAB — CBC WITH DIFFERENTIAL/PLATELET
Basophils Absolute: 0.1 10*3/uL (ref 0.0–0.1)
Basophils Relative: 1 %
Eosinophils Absolute: 0.3 10*3/uL (ref 0.0–0.5)
Eosinophils Relative: 1 %
HCT: 38.1 % (ref 34.8–46.6)
Hemoglobin: 12.4 g/dL (ref 11.6–15.9)
Lymphocytes Relative: 84 %
Lymphs Abs: 23.6 10*3/uL — ABNORMAL HIGH (ref 0.9–3.3)
MCH: 30.8 pg (ref 25.1–34.0)
MCHC: 32.5 g/dL (ref 31.5–36.0)
MCV: 94.9 fL (ref 79.5–101.0)
Monocytes Absolute: 1.2 10*3/uL — ABNORMAL HIGH (ref 0.1–0.9)
Monocytes Relative: 4 %
Neutro Abs: 2.8 10*3/uL (ref 1.5–6.5)
Neutrophils Relative %: 10 %
Platelets: 174 10*3/uL (ref 145–400)
RBC: 4.01 MIL/uL (ref 3.70–5.45)
RDW: 13.4 % (ref 11.2–14.5)
WBC: 28 10*3/uL — ABNORMAL HIGH (ref 3.9–10.3)

## 2017-11-22 MED ORDER — TURMERIC CURCUMIN PO CAPS: 400.0000 | ORAL_CAPSULE | Freq: Two times a day (BID) | ORAL | Status: DC

## 2017-11-22 MED ORDER — TAMOXIFEN CITRATE 20 MG PO TABS: 10.0000 mg | ORAL_TABLET | Freq: Every day | ORAL | 3 refills | Status: DC

## 2017-11-22 NOTE — Assessment & Plan Note (Signed)
Lymphocytosis with cervical lymphadenopathy, flow cytometry revealed B-cell CLL CD5 and CD23 positive WBC: 31.8 ALC 27 Hb: 12.4 Pl: 225 Left cervical lymphadenopathy Staging of CLL: I suspect that the patient has stage I CLL She does not have any specific B symptoms.  Indications for treatment of CLL: Patient does not have any indications to treat urgently. In general indications for treatment include 1. B symptoms 2. Stage III/IV 3. Rapid doubling time I discussed with her that the lymphocyte count is slowly increasing over time.  We need to watch it more closer.  Severe upper respiratory infection: I prescribed levofloxacin for her.  Prognosis of CLL: FISH panel revealed + 12 and -13 Q Which are indicative of favorable prognosis.

## 2017-11-22 NOTE — Telephone Encounter (Signed)
Gave avs and calendar for august  °

## 2017-11-22 NOTE — Assessment & Plan Note (Signed)
Left breast LCIS status post lumpectomy 10/06/2014; Started tamoxifen for breast cancer risk reduction 20 mg daily 11/18/2014 , plan is to treat for 5 years   Tamoxifen toxicities:  1. Hot flashes 6-8 per day : Patient is able to handle the 2.Patient complains of diffuse myalgias and stiffness

## 2017-11-22 NOTE — Progress Notes (Signed)
Patient Care Team: Carol Ada, MD as PCP - General (Family Medicine)  DIAGNOSIS:  Encounter Diagnoses  Name Primary?  . Chronic lymphocytic leukemia of B-cell type not having achieved remission ([place])   . Neoplasm of left breast, primary tumor staging category Tis: lobular carcinoma in situ (LCIS)     SUMMARY OF ONCOLOGIC HISTORY:   Chronic lymphocytic leukemia (CLL), B-cell ([place])   02/08/2016 Initial Diagnosis    Chronic lymphocytic leukemia (CLL), B-cell ([place])       CHIEF COMPLIANT: Follow-up of LCIS and CLL  INTERVAL HISTORY: Colleen Shannon is a 62-year-old with above-mentioned history of CLL who is here for 38-month follow-up.  She had shingles vaccine and immediately had extensive lymphadenopathy in the axilla and she also has a left chest wall lymph node which is markedly swollen.  She tells me that these lymph nodes are getting smaller with time.  Her second shingles vaccine was 3 weeks ago.  She denies any current fevers or chills.  She denies any weight loss night sweats.  She has had chronic cervical lymph nodes which are stable.  REVIEW OF SYSTEMS:   Constitutional: Denies fevers, chills or abnormal weight loss Eyes: Denies blurriness of vision Ears, nose, mouth, throat, and face: Denies mucositis or sore throat Respiratory: Denies cough, dyspnea or wheezes Cardiovascular: Denies palpitation, chest discomfort Gastrointestinal:  Denies nausea, heartburn or change in bowel habits Skin: Denies abnormal skin rashes Lymphatics: Enlarged lymph nodes chest wall on the left side and axilla as well as the neck Neurological:Denies numbness, tingling or new weaknesses Behavioral/Psych: Mood is stable, no new changes  Extremities: No lower extremity edema Breast:  denies any pain or lumps or nodules in either breasts All other systems were reviewed with the patient and are negative.  I have reviewed the past medical history, past surgical history, social history and family  history with the patient and they are unchanged from previous note.  ALLERGIES:  has No Known Allergies.  MEDICATIONS:  Current Outpatient Medications  Medication Sig Dispense Refill  . acetaminophen (TYLENOL) 325 MG tablet Take 650 mg by mouth once as needed for mild pain or headache.    Marland Kitchen amLODipine (NORVASC) 5 MG tablet Take 1 tablet (5 mg total) by mouth at bedtime. 90 tablet 3  . Ascorbic Acid (VITAMIN C PO) Take 900 mg by mouth daily.    Marland Kitchen aspirin EC 81 MG tablet Take 81 mg by mouth every morning.    Marland Kitchen atorvastatin (LIPITOR) 40 MG tablet TAKE 1 TABLET (40 MG TOTAL) BY MOUTH DAILY 90 tablet 3  . Calcium Carb-Cholecalciferol (CALCIUM-VITAMIN D3) 600-400 MG-UNIT TABS Take 1 tablet by mouth 2 (two) times daily.    . carvedilol (COREG) 6.25 MG tablet Take 1.5 tablets (9.375 mg total) by mouth 2 (two) times daily with a meal. 270 tablet 3  . IBUPROFEN PO Take 1 tablet by mouth as needed.    Marland Kitchen NAPROXEN PO Take 1 tablet by mouth as needed.    . Prenatal Multivit-Min-Fe-FA (PRENATAL VITAMINS PO) Take 1 tablet by mouth daily.    Marland Kitchen PROAIR HFA 108 (90 BASE) MCG/ACT inhaler 1-2 puffs every 4 (four) hours as needed.   0  . tamoxifen (NOLVADEX) 20 MG tablet TAKE 1 TABLET BY MOUTH EVERY DAY 90 tablet 3   No current facility-administered medications for this visit.     PHYSICAL EXAMINATION: ECOG PERFORMANCE STATUS: 1 - Symptomatic but completely ambulatory  Vitals:   11/22/17 0853  BP: (!) 117/59  Pulse: 68  Resp: 18  Temp: 98.8 F (37.1 C)  SpO2: 98%   Filed Weights   11/22/17 0853  Weight: 148 lb 12.8 oz (67.5 kg)    GENERAL:alert, no distress and comfortable SKIN: skin color, texture, turgor are normal, no rashes or significant lesions EYES: normal, Conjunctiva are pink and non-injected, sclera clear OROPHARYNX:no exudate, no erythema and lips, buccal mucosa, and tongue normal  NECK: supple, thyroid normal size, non-tender, without nodularity LYMPH:  no palpable lymphadenopathy  in the cervical, axillary or inguinal LUNGS: clear to auscultation and percussion with normal breathing effort HEART: regular rate & rhythm and no murmurs and no lower extremity edema ABDOMEN:abdomen soft, non-tender and normal bowel sounds MUSCULOSKELETAL:no cyanosis of digits and no clubbing  NEURO: alert & oriented x 3 with fluent speech, no focal motor/sensory deficits EXTREMITIES: No lower extremity edema  LABORATORY DATA:  I have reviewed the data as listed CMP Latest Ref Rng & Units 08/20/2017 08/09/2017 02/19/2017  Glucose 70 - 140 mg/dl 125 132 136  BUN 7.0 - 26.0 mg/dL 11.9 10.9 15.7  Creatinine 0.6 - 1.1 mg/dL 0.8 0.7 0.8  Sodium 136 - 145 mEq/L 143 139 143  Potassium 3.5 - 5.1 mEq/L 4.5 3.7 4.0  Chloride 98 - 110 mmol/L - - -  CO2 22 - 29 mEq/L 28 24 26   Calcium 8.4 - 10.4 mg/dL 9.2 8.9 9.1  Total Protein 6.4 - 8.3 g/dL 6.3(L) - 6.3(L)  Total Bilirubin 0.20 - 1.20 mg/dL 0.31 - 0.36  Alkaline Phos 40 - 150 U/L 42 - 36(L)  AST 5 - 34 U/L 19 - 20  ALT 0 - 55 U/L 18 - 19    Lab Results  Component Value Date   WBC 28.0 (H) 11/22/2017   HGB 12.4 11/22/2017   HCT 38.1 11/22/2017   MCV 94.9 11/22/2017   PLT 174 11/22/2017   NEUTROABS 2.8 11/22/2017    ASSESSMENT & PLAN:  Chronic lymphocytic leukemia (CLL), B-cell (HCC) Lymphocytosis with cervical lymphadenopathy, flow cytometry revealed B-cell CLL CD5 and CD23 positive WBC: 28 ALC23.6 Hb: 12.4 Pl:  174 Left cervical lymphadenopathy Staging of CLL: stage I CLL She does not have any specific B symptoms.  Indications for treatment of CLL: Patient does not have any indications to treat urgently. In general indications for treatment include 1. B symptoms 2. Stage III/IV 3. Rapid doubling time I discussed with her that the lymphocyte count is slowly increasing over time.  We need to watch it more closer.  Enlarged lymph nodes secondary to shingles vaccine: I discussed with her that the left chest wall mass is  slightly concerning.  If it does not get better in a month and we will need to do a CT chest.  Prognosis of CLL: FISH panel revealed + 12 and -13 Q Which are indicative of favorable prognosis.    Neoplasm of left breast, primary tumor staging category Tis: lobular carcinoma in situ (LCIS) Left breast LCIS status post lumpectomy 10/06/2014; Started tamoxifen for breast cancer risk reduction 20 mg daily 11/18/2014 , plan is to treat for 5 years   Tamoxifen toxicities:  1. Hot flashes 6-8 per day : Patient is able to handle the 2.Patient complains of diffuse myalgias and stiffness I encouraged her to decrease the dose of tamoxifen to 10 mg daily.  Recent studies have shown that even 5 mg of tamoxifen appears to be fantastic option to reduce the risk of breast cancer.  Return to clinic in 6 months  with labs and follow-up   I spent 25 minutes talking to the patient of which more than half was spent in counseling and coordination of care.  No orders of the defined types were placed in this encounter.  The patient has a good understanding of the overall plan. she agrees with it. she will call with any problems that may develop before the next visit here.   Harriette Ohara, MD 11/22/17

## 2017-12-14 DIAGNOSIS — M170 Bilateral primary osteoarthritis of knee: Secondary | ICD-10-CM | POA: Diagnosis not present

## 2017-12-14 DIAGNOSIS — Z96653 Presence of artificial knee joint, bilateral: Secondary | ICD-10-CM | POA: Diagnosis not present

## 2017-12-14 DIAGNOSIS — Z471 Aftercare following joint replacement surgery: Secondary | ICD-10-CM | POA: Diagnosis not present

## 2017-12-17 DIAGNOSIS — Z96651 Presence of right artificial knee joint: Secondary | ICD-10-CM | POA: Insufficient documentation

## 2018-01-29 ENCOUNTER — Telehealth: Payer: Self-pay | Admitting: Cardiovascular Disease

## 2018-01-29 NOTE — Telephone Encounter (Signed)
Pt is calling and having tachycardia and need to speak to nurse.

## 2018-01-29 NOTE — Telephone Encounter (Signed)
Returned call to patient.She stated heart has been fluttering this week,rated 90 to 105.Stated Coreg was increased recently.Stated she feels anxious.Appointment scheduled with Almyra Deforest PA 01/30/18 at 3:30 pm.Advised to go to ED if needed.

## 2018-01-30 ENCOUNTER — Encounter: Payer: Self-pay | Admitting: Physician Assistant

## 2018-01-30 ENCOUNTER — Ambulatory Visit (INDEPENDENT_AMBULATORY_CARE_PROVIDER_SITE_OTHER): Payer: BLUE CROSS/BLUE SHIELD | Admitting: Physician Assistant

## 2018-01-30 VITALS — BP 124/72 | HR 83 | Ht 61.0 in | Wt 150.0 lb

## 2018-01-30 DIAGNOSIS — C9190 Lymphoid leukemia, unspecified not having achieved remission: Secondary | ICD-10-CM

## 2018-01-30 DIAGNOSIS — I10 Essential (primary) hypertension: Secondary | ICD-10-CM | POA: Diagnosis not present

## 2018-01-30 DIAGNOSIS — E785 Hyperlipidemia, unspecified: Secondary | ICD-10-CM

## 2018-01-30 DIAGNOSIS — R002 Palpitations: Secondary | ICD-10-CM | POA: Diagnosis not present

## 2018-01-30 DIAGNOSIS — C9110 Chronic lymphocytic leukemia of B-cell type not having achieved remission: Secondary | ICD-10-CM

## 2018-01-30 MED ORDER — CARVEDILOL 12.5 MG PO TABS: 12.5000 mg | ORAL_TABLET | Freq: Two times a day (BID) | ORAL | 3 refills | Status: DC

## 2018-01-30 NOTE — Patient Instructions (Signed)
Medication Instructions:  INCREASED Coreg 12.5mg  take 1 tablet twice a day; Take 6.25mg  as needed for palpitations  Labwork: Your physician recommends that you return for lab work in: [place]  Testing/Procedures: NONE   Follow-Up: Your physician recommends that you schedule a follow-up appointment in: [place].  Any Other Special Instructions Will Be Listed Below (If Applicable).  If you need a refill on your cardiac medications before your next appointment, please call your pharmacy.

## 2018-01-30 NOTE — Progress Notes (Signed)
Cardiology Office Note    Date:  01/30/2018   ID:  Colleen Shannon, DOB [birth year], MRN [id number]  PCP:  Carol Ada, MD  Cardiologist:  Dr. Claiborne Billings  Chief Complaint  Patient presents with  . Follow-up    x 1 week resting heartrate 85-105, feels like heart is beating quickly    History of Present Illness:  Colleen Shannon is a 62 y.o. female with PMH of HTN, HLD, CLL, and history of chest pain.  She was previously treated with carvedilol by Dr. Elisabeth Cara for presumed spasm.  Myoview in August 2013 was normal.  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  She is on Lipitor for her cholesterol.  She is to be on the phentermine, this was later switched to amlodipine.  Echocardiogram in 2013 showed normal EF, mild MR.  She is being followed by Dr. Lindi Adie of hematology service for chronic lymphocytic leukemia.  Based on the recent note, her CLL has been monitored closely.  Given the lack of the symptoms, there was currently no urgent plan to treat.  Patient was most recently seen by Dr. Claiborne Billings in January 2019, she was doing well at the time except for occasional palpitations that last approximately 20 seconds.  Her carvedilol was increased to 9.375 mg twice a day.  Repeat echocardiogram obtained on 11/14/2017 showed EF 55-60%, grade 1 DD, mild MR, PA peak pressure 23 mmHg.  Patient presents today for cardiology office visit.  For the past week, she has been having more palpitations.  She describes the palpitations as a skipped heartbeat and it feels like her heart turned, this is followed by some rapid heartbeat for a few seconds afterward.  The symptoms she described sounds like symptomatic PVCs.  She says her baseline heart rate usually running in the 60s has also been increasing to the 80s recently.  She drinks 1 cup of coffee per day.  I wish to hold off on heart monitor at this time, EKG today showed  normal sinus rhythm.  I will increase her carvedilol to 12.5 mg twice daily with instruction that she can take additional 6.25 mg at night on a as needed basis for palpitation.  She described a sharp shooting pain that last a second along with the skipped heartbeat, this symptom does not occur during the day when she is exerting herself, no further workup is needed for this atypical chest pain.  Her brother recently had a MI in his 14s, patient is aware that if she started having exertional chest symptom or shortness of breath, she will need to let us know to consider a stress test versus other cardiac testing.   Past Medical History:  Diagnosis Date  . Arthritis   . Cancer (HCC)    hx of skin cancer   . Elevated cholesterol 05/22/12   Nuc stress test-Normal. The patiet had a markedly positive GXT for ischemia. post stress EF 72%  . Endometrial polyp   . Hypertension 05/22/12   ECHO-EF >55% trace tricupsid regurgitation. There is mild mitral regurgitation.     Past Surgical History:  Procedure Laterality Date  . ABDOMINAL SURGERY     Abdominoplasty  . CESAREAN SECTION     X 2  . CHOLECYSTECTOMY    . COLONSCOPY    . DILATION AND CURETTAGE OF UTERUS    . FOOT SURGERY    . HYSTEROSCOPY    .  KNEE CLOSED REDUCTION Right 07/20/2014   Procedure: RIGHT KNEE CLOSED MANIPULATION;  Surgeon: [name], MD;  Location: [facility];  Service: Orthopedics;  Laterality: Right;  . KNEE SURGERY     arthroscopic  . RADIOACTIVE SEED GUIDED EXCISIONAL BREAST BIOPSY N/A 10/06/2014   Procedure: RADIOACTIVE SEED GUIDED EXCISIONAL BREAST BIOPSY;  Surgeon: Rolm Bookbinder, MD;  Location: [place];  Service: General;  Laterality: N/A;  . TOTAL KNEE ARTHROPLASTY Right 05/11/2014   Procedure: RIGHT TOTAL KNEE ARTHROPLASTY;  Surgeon: [name], MD;  Location: [facility];  Service: Orthopedics;  Laterality: Right;  . TUBAL LIGATION      Current Medications: Outpatient Medications Prior to  Visit  Medication Sig Dispense Refill  . acetaminophen (TYLENOL) 325 MG tablet Take 650 mg by mouth once as needed for mild pain or headache.    Marland Kitchen amLODipine (NORVASC) 5 MG tablet Take 1 tablet (5 mg total) by mouth at bedtime. 90 tablet 3  . Ascorbic Acid (VITAMIN C PO) Take 900 mg by mouth daily.    Marland Kitchen aspirin EC 81 MG tablet Take 81 mg by mouth every morning.    Marland Kitchen atorvastatin (LIPITOR) 40 MG tablet TAKE 1 TABLET (40 MG TOTAL) BY MOUTH DAILY 90 tablet 3  . Calcium Carb-Cholecalciferol (CALCIUM-VITAMIN D3) 600-400 MG-UNIT TABS Take 1 tablet by mouth 2 (two) times daily.    . IBUPROFEN PO Take 1 tablet by mouth as needed.    Marland Kitchen NAPROXEN PO Take 1 tablet by mouth as needed.    . Prenatal Multivit-Min-Fe-FA (PRENATAL VITAMINS PO) Take 1 tablet by mouth daily.    Marland Kitchen PROAIR HFA 108 (90 BASE) MCG/ACT inhaler 1-2 puffs every 4 (four) hours as needed.   0  . tamoxifen (NOLVADEX) 20 MG tablet Take 0.5 tablets (10 mg total) by mouth daily. 45 tablet 3  . carvedilol (COREG) 6.25 MG tablet Take 1.5 tablets (9.375 mg total) by mouth 2 (two) times daily with a meal. 270 tablet 3  . Misc Natural Products (TURMERIC CURCUMIN) CAPS Take 400 each by mouth 2 (two) times daily.     No facility-administered medications prior to visit.      Allergies:   Patient has no known allergies.   Social History   Socioeconomic History  . Marital status: Married    Spouse name: Not on file  . Number of children: Not on file  . Years of education: Not on file  . Highest education level: Not on file  Occupational History  . Not on file  Social Needs  . Financial resource strain: Not on file  . Food insecurity:    Worry: Not on file    Inability: Not on file  . Transportation needs:    Medical: Not on file    Non-medical: Not on file  Tobacco Use  . Smoking status: Never Smoker  . Smokeless tobacco: Never Used  Substance and Sexual Activity  . Alcohol use: Yes    Alcohol/week: 0.5 oz    Types: 1 Standard  drinks or equivalent per week    Comment: occasional   . Drug use: No  . Sexual activity: Yes    Birth control/protection: Surgical, Post-menopausal  Lifestyle  . Physical activity:    Days per week: Not on file    Minutes per session: Not on file  . Stress: Not on file  Relationships  . Social connections:    Talks on phone: Not on file    Gets together: Not on  file    Attends religious service: Not on file    Active member of club or organization: Not on file    Attends meetings of clubs or organizations: Not on file    Relationship status: Not on file  Other Topics Concern  . Not on file  Social History Narrative  . Not on file     Family History:  The patient's family history includes Aneurysm in her mother; Cancer - Other in her maternal grandfather and paternal grandmother; Heart disease in her father and maternal grandfather; Hyperlipidemia in her mother; Hypertension in her brother and paternal grandfather; Stroke in her paternal grandfather.   ROS:   Please see the history of present illness.    ROS All other systems reviewed and are negative.   PHYSICAL EXAM:   VS:  BP 124/72   Pulse 83   Ht 5\' 1"  (1.549 m)   Wt 150 lb (68 kg)   LMP 11/19/2011   BMI 28.34 kg/m    GEN: Well nourished, well developed, in no acute distress  HEENT: normal  Neck: no JVD, carotid bruits, or masses Cardiac: RRR; no murmurs, rubs, or gallops,no edema  Respiratory:  clear to auscultation bilaterally, normal work of breathing GI: soft, nontender, nondistended, + BS MS: no deformity or atrophy  Skin: warm and dry, no rash Neuro:  Alert and Oriented x 3, Strength and sensation are intact Psych: euthymic mood, full affect  Wt Readings from Last 3 Encounters:  01/30/18 150 lb (68 kg)  11/22/17 148 lb 12.8 oz (67.5 kg)  11/08/17 148 lb (67.1 kg)      Studies/Labs Reviewed:   EKG:  EKG is ordered today.  The ekg ordered today demonstrates NSR without significant ST-T wave  changes  Recent Labs: 11/22/2017: ALT 18; BUN 19; Creatinine, Ser 0.77; Hemoglobin 12.4; Platelets 174; Potassium 5.1; Sodium 141   Lipid Panel    Component Value Date/Time   CHOL 167 08/19/2015 0857   TRIG 84 08/19/2015 0857   HDL 73 08/19/2015 0857   CHOLHDL 2.3 08/19/2015 0857   VLDL 17 08/19/2015 0857   LDLCALC 77 08/19/2015 0857    Additional studies/ records that were reviewed today include:   Echo 11/14/2017 LV EF: 55% -   60% Study Conclusions  - Left ventricle: The cavity size was normal. Wall thickness was   normal. Systolic function was normal. The estimated ejection   fraction was in the range of 55% to 60%. Wall motion was normal;   there were no regional wall motion abnormalities. Doppler   parameters are consistent with abnormal left ventricular   relaxation (grade 1 diastolic dysfunction). - Aortic valve: There was no stenosis. - Mitral valve: There was mild regurgitation. - Right ventricle: The cavity size was normal. Systolic function   was normal. - Pulmonary arteries: PA peak pressure: 23 mm Hg (S). - Inferior vena cava: The vessel was normal in size. The   respirophasic diameter changes were in the normal range (= 50%),   consistent with normal central venous pressure.  Impressions:  - Normal LV size with EF 55-60%. Normal RV size and systolic   function. Mild MR.     ASSESSMENT:    1. Palpitations   2. Essential hypertension   3. Hyperlipidemia, unspecified hyperlipidemia type   4. CLL (chronic lymphocytic leukemia) (HCC)      PLAN:  In order of problems listed above:  1. Palpitation: Likely symptomatic PVCs.  Will increase carvedilol to 12.5 mg twice  daily.  She may take additional 6.25 mg as needed for increased palpitation.  If symptom worsens, we can consider a heart monitor later.  Obtain TSH.  2. Hypertension: Blood pressure stable.  3. Hyperlipidemia: On Lipitor 40 mg daily.  Cholesterol being monitored by primary care  provider.  4. CLL: Followed by Dr. Lindi Adie    Medication Adjustments/Labs and Tests Ordered: Current medicines are reviewed at length with the patient today.  Concerns regarding medicines are outlined above.  Medication changes, Labs and Tests ordered today are listed in the Patient Instructions below. Patient Instructions  Medication Instructions:  INCREASED Coreg 12.5mg  take 1 tablet twice a day; Take 6.25mg  as needed for palpitations  Labwork: Your physician recommends that you return for lab work in: [place]  Testing/Procedures: NONE   Follow-Up: Your physician recommends that you schedule a follow-up appointment in: [place].  Any Other Special Instructions Will Be Listed Below (If Applicable).  If you need a refill on your cardiac medications before your next appointment, please call your pharmacy.     Hilbert Corrigan, [place]  01/30/2018 6:01 PM    [facility] [place], [place], [place]  [address] Phone: [phone]; Fax: [phone]

## 2018-01-31 LAB — TSH: TSH: 1.86 u[IU]/mL (ref 0.450–4.500)

## 2018-02-27 DIAGNOSIS — C44622 Squamous cell carcinoma of skin of right upper limb, including shoulder: Secondary | ICD-10-CM | POA: Diagnosis not present

## 2018-04-22 DIAGNOSIS — Z01419 Encounter for gynecological examination (general) (routine) without abnormal findings: Secondary | ICD-10-CM | POA: Diagnosis not present

## 2018-04-22 DIAGNOSIS — Z1212 Encounter for screening for malignant neoplasm of rectum: Secondary | ICD-10-CM | POA: Diagnosis not present

## 2018-04-22 DIAGNOSIS — Z6828 Body mass index (BMI) 28.0-28.9, adult: Secondary | ICD-10-CM | POA: Diagnosis not present

## 2018-04-26 ENCOUNTER — Ambulatory Visit (INDEPENDENT_AMBULATORY_CARE_PROVIDER_SITE_OTHER): Payer: BLUE CROSS/BLUE SHIELD | Admitting: Cardiovascular Disease

## 2018-04-26 ENCOUNTER — Encounter: Payer: Self-pay | Admitting: Cardiovascular Disease

## 2018-04-26 VITALS — BP 102/70 | HR 64 | Ht 61.0 in | Wt 151.0 lb

## 2018-04-26 DIAGNOSIS — C9110 Chronic lymphocytic leukemia of B-cell type not having achieved remission: Secondary | ICD-10-CM

## 2018-04-26 DIAGNOSIS — R002 Palpitations: Secondary | ICD-10-CM | POA: Diagnosis not present

## 2018-04-26 DIAGNOSIS — C9190 Lymphoid leukemia, unspecified not having achieved remission: Secondary | ICD-10-CM | POA: Diagnosis not present

## 2018-04-26 DIAGNOSIS — E785 Hyperlipidemia, unspecified: Secondary | ICD-10-CM

## 2018-04-26 DIAGNOSIS — I10 Essential (primary) hypertension: Secondary | ICD-10-CM | POA: Diagnosis not present

## 2018-04-26 DIAGNOSIS — Z79899 Other long term (current) drug therapy: Secondary | ICD-10-CM | POA: Diagnosis not present

## 2018-04-26 DIAGNOSIS — Z8249 Family history of ischemic heart disease and other diseases of the circulatory system: Secondary | ICD-10-CM | POA: Diagnosis not present

## 2018-04-26 MED ORDER — EZETIMIBE 10 MG PO TABS: 10.0000 mg | ORAL_TABLET | Freq: Every day | ORAL | 3 refills | Status: DC

## 2018-04-26 MED ORDER — ATORVASTATIN CALCIUM 20 MG PO TABS: 20.0000 mg | ORAL_TABLET | Freq: Every day | ORAL | 3 refills | Status: DC

## 2018-04-26 NOTE — Progress Notes (Signed)
Patient ID: Colleen Shannon, female   DOB: 1955-11-27, 62 y.o.   MRN: [id number]     HPI: Colleen Shannon is a 62 y.o. female presents to the office today for a 6 month cardiology evaluation.  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  Colleen Shannon has a history of hypertension and previous chest pain for which he was started on carvedilol and initially nifedipine by Dr. Elisabeth Cara with a presumptive dose of possible spasm. In August 2013 a nuclear perfusion study was entirely normal. 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 She has been on lipid-lowering therapy with atorvastatin. When I saw her 2 years ago I discontinued her nifedipine and switched her to amlodipine. She states her blood pressure has been controlled on this regimen. An echo Doppler study in August 2013 showed normal systolic function. She did have mild mitral regurgitation.  Over the past year, she has done well from a cardiac standpoint.  She denies any episodes of chest pain.  He denies palpitations.  She is now followed by Dr. Carol Ada for primary care.  Recent laboratory had been drawn for which she was told was excellent.  Colleen Shannon underwent right knee replacement surgery by Dr. Sandie Ano on 05/11/2014.  She tolerated that well from a cardiovascular standpoint.  She underwent knee replacement to her left knee in November 2017.  She also was diagnosed as having CLL.  She is followed by Dr. Lindi Adie. .  She continues to take tamoxifen for previous carcinoma in situ on breast biopsy.   I last saw her in January 2019 at which time she had noticed palpitations which may occur 4-5 days per week.  These typically are isolated and last for approximately 20 seconds.  She denies any sustained episodes of tachycardia.  She denies chest pain.  I recommended slight titration of her current date of carvedilol up to 9.375 mg  twice a day.  Her blood pressure was stable and she was advised to continue her current dose of amlodipine.    Follow-up echo Doppler study in February 2019 which showed an EF of 55 to 60% with grade 1 diastolic dysfunction.  There was mild mitral regurgitation.  Pulmonary pressures were normal.    She was seen in April 2019 by Almyra Deforest, PA and had noted that her baseline heart rate had been increasing to the 80s.  She was drinking 1 cup of coffee per day.  Carvedilol was further increased to 12.5 mg twice a day.  Presently, she denies any episodes of chest pain.  However, she is concerned that her younger brother recently suffered a myocardial infarction and required insertion of 4 coronary stents.  She was inquiring about screening for CAD.   Past Medical History:  Diagnosis Date  . Arthritis   . Cancer (HCC)    hx of skin cancer   . Elevated cholesterol 05/22/12   Nuc stress test-Normal. The patiet had a markedly positive GXT for ischemia. post stress EF 72%  . Endometrial polyp   . Hypertension 05/22/12   ECHO-EF >55% trace tricupsid regurgitation. There is mild mitral regurgitation.     Past Surgical History:  Procedure Laterality Date  . ABDOMINAL SURGERY     Abdominoplasty  . CESAREAN SECTION     X 2  . CHOLECYSTECTOMY    . COLONSCOPY    . DILATION AND  CURETTAGE OF UTERUS    . FOOT SURGERY    . HYSTEROSCOPY    . KNEE CLOSED REDUCTION Right 07/20/2014   Procedure: RIGHT KNEE CLOSED MANIPULATION;  Surgeon: [name], MD;  Location: [facility];  Service: Orthopedics;  Laterality: Right;  . KNEE SURGERY     arthroscopic  . RADIOACTIVE SEED GUIDED EXCISIONAL BREAST BIOPSY N/A 10/06/2014   Procedure: RADIOACTIVE SEED GUIDED EXCISIONAL BREAST BIOPSY;  Surgeon: Rolm Bookbinder, MD;  Location: [place];  Service: General;  Laterality: N/A;  . TOTAL KNEE ARTHROPLASTY Right 05/11/2014   Procedure: RIGHT TOTAL KNEE ARTHROPLASTY;  Surgeon: [name], MD;   Location: [facility];  Service: Orthopedics;  Laterality: Right;  . TUBAL LIGATION      No Known Allergies  Current Outpatient Medications  Medication Sig Dispense Refill  . acetaminophen (TYLENOL) 325 MG tablet Take 650 mg by mouth once as needed for mild pain or headache.    Marland Kitchen amLODipine (NORVASC) 5 MG tablet Take 1 tablet (5 mg total) by mouth at bedtime. 90 tablet 3  . Ascorbic Acid (VITAMIN C PO) Take 900 mg by mouth daily.    Marland Kitchen aspirin EC 81 MG tablet Take 81 mg by mouth every morning.    . Calcium Carb-Cholecalciferol (CALCIUM-VITAMIN D3) 600-400 MG-UNIT TABS Take 1 tablet by mouth 2 (two) times daily.    . carvedilol (COREG) 12.5 MG tablet Take 1 tablet (12.5 mg total) by mouth 2 (two) times daily with a meal. Can take half tab (6.51m) as needed for palpitations 100 tablet 3  . IBUPROFEN PO Take 2-3 tablets by mouth every morning.     .Marland KitchenNAPROXEN PO Take 1 tablet by mouth as needed.    . Prenatal Multivit-Min-Fe-FA (PRENATAL VITAMINS PO) Take 1 tablet by mouth daily.    .Marland KitchenPROAIR HFA 108 (90 BASE) MCG/ACT inhaler 1-2 puffs every 4 (four) hours as needed.   0  . tamoxifen (NOLVADEX) 20 MG tablet Take 0.5 tablets (10 mg total) by mouth daily. 45 tablet 3  . atorvastatin (LIPITOR) 20 MG tablet Take 1 tablet (20 mg total) by mouth daily. 90 tablet 3  . ezetimibe (ZETIA) 10 MG tablet Take 1 tablet (10 mg total) by mouth daily. 90 tablet 3   No current facility-administered medications for this visit.     Social History   Socioeconomic History  . Marital status: Married    Spouse name: Not on file  . Number of children: Not on file  . Years of education: Not on file  . Highest education level: Not on file  Occupational History  . Not on file  Social Needs  . Financial resource strain: Not on file  . Food insecurity:    Worry: Not on file    Inability: Not on file  . Transportation needs:    Medical: Not on file    Non-medical: Not on file  Tobacco Use  . Smoking status: Never  Smoker  . Smokeless tobacco: Never Used  Substance and Sexual Activity  . Alcohol use: Yes    Alcohol/week: 0.6 oz    Types: 1 Standard drinks or equivalent per week    Comment: occasional   . Drug use: No  . Sexual activity: Yes    Birth control/protection: Surgical, Post-menopausal  Lifestyle  . Physical activity:    Days per week: Not on file    Minutes per session: Not on file  . Stress: Not on file  Relationships  .  Social connections:    Talks on phone: Not on file    Gets together: Not on file    Attends religious service: Not on file    Active member of club or organization: Not on file    Attends meetings of clubs or organizations: Not on file    Relationship status: Not on file  . Intimate partner violence:    Fear of current or ex partner: Not on file    Emotionally abused: Not on file    Physically abused: Not on file    Forced sexual activity: Not on file  Other Topics Concern  . Not on file  Social History Narrative  . Not on file   Additional social history is notable that she is married for 33 years. 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 There is no tobacco use. He does drink occasional wine. .  She is status post bilateral knee replacements.  She has resumed exercise.  Family History  Problem Relation Age of Onset  . Heart disease Father   . Aneurysm Mother   . Hyperlipidemia Mother   . Hypertension Brother   . Cancer - Other Paternal Grandmother        uterine  . Hypertension Paternal Grandfather   . Stroke Paternal Grandfather   . Heart disease Maternal Grandfather   . Cancer - Other Maternal Grandfather        lung   ROS General: Negative; No fevers, chills, or night sweats;  HEENT: Negative; No changes in vision or hearing, sinus congestion, difficulty swallowing Pulmonary: Negative; No cough, wheezing, shortness of breath, hemoptysis Cardiovascular: Negative; No chest pain, presyncope, syncope,  palpitations GI: Negative; No nausea, vomiting, diarrhea, or abdominal pain GU: Negative; No dysuria, hematuria, or difficulty voiding Musculoskeletal: Positive for bilateral knee replacement surgery;positive for osteoarthritis no myalgias, Hematologic/Oncology: positive for CLL Endocrine: Negative; no heat/cold intolerance; no diabetes Neuro: Negative; no changes in balance, headaches Skin: Negative; No rashes or skin lesions Psychiatric: Negative; No behavioral problems, depression Sleep: Negative; No snoring, daytime sleepiness, hypersomnolence, bruxism, restless legs, hypnogognic hallucinations, no cataplexy Other comprehensive 14 point system review is negative.  PE BP 102/70   Pulse 64   Ht 5' 1"  (1.549 m)   Wt 151 lb (68.5 kg)   LMP 11/19/2011   BMI 28.53 kg/m    Repeat blood pressure by me 138/70 supine and 134/68 standing   Wt Readings from Last 3 Encounters:  04/26/18 151 lb (68.5 kg)  01/30/18 150 lb (68 kg)  11/22/17 148 lb 12.8 oz (67.5 kg)   General: Alert, oriented, no distress.  Skin: normal turgor, no rashes, warm and dry HEENT: Normocephalic, atraumatic. Pupils equal round and reactive to light; sclera anicteric; extraocular muscles intact;  Nose without nasal septal hypertrophy Mouth/Parynx benign; Mallinpatti scale 2 Neck: No JVD, no carotid bruits; normal carotid upstroke Lungs: clear to ausculatation and percussion; no wheezing or rales Chest wall: without tenderness to palpitation Heart: PMI not displaced, RRR, s1 s2 normal, 1/6 systolic murmur, no diastolic murmur, no rubs, gallops, thrills, or heaves Abdomen: soft, nontender; no hepatosplenomehaly, BS+; abdominal aorta nontender and not dilated by palpation. Back: no CVA tenderness Pulses 2+ Musculoskeletal: full range of motion, normal strength, no joint deformities Extremities: no clubbing cyanosis or edema, Homan's sign negative  Neurologic: grossly nonfocal; Cranial nerves grossly  wnl Psychologic: Normal mood and affect   ECG (independently read by me): Normal sinus rhythm at 64 bpm.  No ectopy.  Normal intervals.  No ST segment changes.  January 2019 ECG (independently read by me): normal sinus rhythm at 76 bpm.  Normal intervals.  No ectopy or ST changes  November 2016 ECG (independently read by me) from 07/17/2014: Normal sinus rhythm.  No ectopy.  Normal intervals.  No ST segment changes.  August 2014 ECG: Normal sinus rhythm at 75 beats per minute without ectopy. Normal intervals.  LABS: BMP Latest Ref Rng & Units 11/22/2017 08/20/2017 08/09/2017  Glucose 70 - 140 mg/dL 109 125 132  BUN 7 - 26 mg/dL 19 11.9 10.9  Creatinine 0.60 - 1.10 mg/dL 0.77 0.8 0.7  Sodium 136 - 145 mmol/L 141 143 139  Potassium 3.5 - 5.1 mmol/L 5.1 4.5 3.7  Chloride 98 - 109 mmol/L 108 - -  CO2 22 - 29 mmol/L 27 28 24   Calcium 8.4 - 10.4 mg/dL 8.8 9.2 8.9   Hepatic Function Latest Ref Rng & Units 11/22/2017 08/20/2017 02/19/2017  Total Protein 6.4 - 8.3 g/dL 6.0(L) 6.3(L) 6.3(L)  Albumin 3.5 - 5.0 g/dL 3.8 3.6 3.9  AST 5 - 34 U/L 22 19 20   ALT 0 - 55 U/L 18 18 19   Alk Phosphatase 40 - 150 U/L 31(L) 42 36(L)  Total Bilirubin 0.2 - 1.2 mg/dL 0.4 0.31 0.36   CBC Latest Ref Rng & Units 11/22/2017 08/20/2017 08/09/2017  WBC 3.9 - 10.3 K/uL 28.0(H) 31.8(H) 40.8(H)  Hemoglobin 11.6 - 15.9 g/dL 12.4 12.4 12.2  Hematocrit 34.8 - 46.6 % 38.1 38.3 37.4  Platelets 145 - 400 K/uL 174 225 182   Lab Results  Component Value Date   MCV 94.9 11/22/2017   MCV 96.5 08/20/2017   MCV 94.6 08/09/2017   Lab Results  Component Value Date   TSH 1.860 01/30/2018   Lipid Panel     Component Value Date/Time   CHOL 167 08/19/2015 0857   TRIG 84 08/19/2015 0857   HDL 73 08/19/2015 0857   CHOLHDL 2.3 08/19/2015 0857   VLDL 17 08/19/2015 0857   LDLCALC 77 08/19/2015 0857    RADIOLOGY: No results found.  IMPRESSION: 1. Medication management   2. Essential hypertension   3. Family history  of ischemic heart disease   4. CLL (chronic lymphocytic leukemia) (HCC)   5. Palpitations   6. Hyperlipidemia with target LDL less than 70     ASSESSMENT AND PLAN: Colleen Shannon has a history of hypertension, and remotely had experienced chest pain.  In August 2013 nuclear perfusion study was normal.  Not having seen her in over 2 years, when I saw her in January 2019, her blood pressure was mildly elevated based on recent change in guidelines and she was experiencing some episodic palpitations.  Her palpitations improved with slight titration of carvedilol and when seen several months later her dose was further titrated to 12.5 mg twice a day.  Blood pressure today is stable and repeat by me was 110/70 on amlodipine 5 mg and carvedilol 12.5 mg twice a day.  She has been on atorvastatin 40 mg for hyperlipidemia.  Lipid studies in April 2018 showed an LDL of 74.  He has noticed some occasional myalgias on the atorvastatin dose.  As result, I will decrease her atorvastatin to 20 mg but initiate Zetia 10 mg for more aggressive lipid therapy.  She is followed for CLL which has been stable.  In addition her dose of tamoxifen has been reduced in half for previous carcinoma in situ of her  breast.  She is concerned with the demonstration that her brother was found to have significant cardio artery disease and previously had been asymptomatic.  I have recommended that she undergo a cardiac calcium score for initial assessment of potential coronary atherosclerosis.  She is asymptomatic with reference to chest pain or change in exercise tolerance.  In 3 months she will undergo repeat laboratory.  I will see her in the office in follow-up and further recommendations will be made at that time.   Time spent: 25 minutes Troy Sine, MD, [facility]  04/27/2018 2:06 PM

## 2018-04-26 NOTE — Patient Instructions (Signed)
Medication Instructions:  Your physician has recommended you make the following change in your medication:  1) DECREASE Atorvastatin to 20 mg tablet by mouth ONCE daily  2) START Zetia 10 mg tablet by mouth ONCE daily   Labwork: Your physician recommends that you return for lab work in: 3 months  - FASTING   Testing/Procedures: Your provider has recommended you have a Calcium Score to evaluate your cardiac risk. This test cost $150. It is complete at: [address] [place]: Your physician recommends that you schedule a follow-up appointment in: [place]. [place].   Any Other Special Instructions Will Be Listed Below (If Applicable).     If you need a refill on your cardiac medications before your next appointment, please call your pharmacy.

## 2018-04-27 ENCOUNTER — Encounter: Payer: Self-pay | Admitting: Cardiovascular Disease

## 2018-05-06 ENCOUNTER — Ambulatory Visit (INDEPENDENT_AMBULATORY_CARE_PROVIDER_SITE_OTHER)
Admission: RE | Admit: 2018-05-06 | Discharge: 2018-05-06 | Disposition: A | Discharge status: Home / Self Care | Payer: BLUE CROSS/BLUE SHIELD | Source: Ambulatory Visit | Attending: Cardiovascular Disease | Admitting: Cardiovascular Disease

## 2018-05-06 DIAGNOSIS — I10 Essential (primary) hypertension: Secondary | ICD-10-CM

## 2018-05-06 DIAGNOSIS — Z79899 Other long term (current) drug therapy: Secondary | ICD-10-CM

## 2018-05-21 ENCOUNTER — Telehealth: Payer: Self-pay | Admitting: Hematology and Oncology

## 2018-05-21 ENCOUNTER — Inpatient Hospital Stay: Payer: BLUE CROSS/BLUE SHIELD | Attending: Hematology and Oncology | Admitting: Hematology and Oncology

## 2018-05-21 ENCOUNTER — Inpatient Hospital Stay: Payer: BLUE CROSS/BLUE SHIELD

## 2018-05-21 DIAGNOSIS — C9110 Chronic lymphocytic leukemia of B-cell type not having achieved remission: Secondary | ICD-10-CM

## 2018-05-21 DIAGNOSIS — D0502 Lobular carcinoma in situ of left breast: Secondary | ICD-10-CM | POA: Diagnosis not present

## 2018-05-21 DIAGNOSIS — Z79810 Long term (current) use of selective estrogen receptor modulators (SERMs): Secondary | ICD-10-CM | POA: Diagnosis not present

## 2018-05-21 LAB — CBC WITH DIFFERENTIAL (CANCER CENTER ONLY)
BAND NEUTROPHILS: 0 %
BASOS ABS: 0 10*3/uL (ref 0.0–0.1)
Basophils Relative: 0 %
Blasts: 0 %
EOS ABS: 0 10*3/uL (ref 0.0–0.5)
Eosinophils Relative: 0 %
HCT: 39.4 % (ref 34.8–46.6)
Hemoglobin: 12.7 g/dL (ref 11.6–15.9)
LYMPHS PCT: 93 %
Lymphs Abs: 33.1 10*3/uL — ABNORMAL HIGH (ref 0.9–3.3)
MCH: 31.1 pg (ref 25.1–34.0)
MCHC: 32.2 g/dL (ref 31.5–36.0)
MCV: 96.6 fL (ref 79.5–101.0)
METAMYELOCYTES PCT: 0 %
MONOS PCT: 3 %
Monocytes Absolute: 1.1 10*3/uL — ABNORMAL HIGH (ref 0.1–0.9)
Myelocytes: 0 %
NEUTROS ABS: 1.4 10*3/uL — AB (ref 1.5–6.5)
Neutrophils Relative %: 4 %
OTHER: 0 %
Platelet Count: 152 10*3/uL (ref 145–400)
Promyelocytes Relative: 0 %
RBC: 4.08 MIL/uL (ref 3.70–5.45)
RDW: 14 % (ref 11.2–14.5)
WBC Count: 35.6 10*3/uL — ABNORMAL HIGH (ref 3.9–10.3)
nRBC: 0 /100 WBC

## 2018-05-21 LAB — CMP (CANCER CENTER ONLY)
ALBUMIN: 4 g/dL (ref 3.5–5.0)
ALT: 20 U/L (ref 0–44)
ANION GAP: 11 (ref 5–15)
AST: 20 U/L (ref 15–41)
Alkaline Phosphatase: 37 U/L — ABNORMAL LOW (ref 38–126)
BILIRUBIN TOTAL: 0.4 mg/dL (ref 0.3–1.2)
BUN: 15 mg/dL (ref 6–20)
CHLORIDE: 106 mmol/L (ref 98–111)
CO2: 27 mmol/L (ref 22–32)
Calcium: 9.5 mg/dL (ref 8.9–10.3)
Creatinine: 0.78 mg/dL (ref 0.44–1.00)
GFR, Est AFR Am: >60 mL/min (ref >60)
GFR, Estimated: >60 mL/min (ref >60)
GLUCOSE: 124 mg/dL — AB (ref 70–99)
POTASSIUM: 4.4 mmol/L (ref 3.5–5.1)
SODIUM: 144 mmol/L (ref 135–145)
Total Protein: 6.4 g/dL — ABNORMAL LOW (ref 6.5–8.1)

## 2018-05-21 LAB — LACTATE DEHYDROGENASE: LDH: 266 U/L — AB (ref 98–192)

## 2018-05-21 NOTE — Assessment & Plan Note (Signed)
Left breast LCIS status post lumpectomy 10/06/2014; Started tamoxifen for breast cancer risk reduction 20 mg daily 11/18/2014 , decrease to 10 mg on 11/22/2017 plan is to treat for 5 years   Tamoxifen toxicities:  1. Hot flashes 6-8 per day : Patient is able to handle the 2.Patient complains of diffuse myalgias and stiffness  Breast cancer surveillance: Mammograms at [facility]  Return to clinic in 6 months for follow-up

## 2018-05-21 NOTE — Progress Notes (Signed)
Patient Care Team: Carol Ada, MD as PCP - General (Family Medicine)  DIAGNOSIS:  Encounter Diagnoses  Name Primary?  . Chronic lymphocytic leukemia of B-cell type not having achieved remission ([place])   . Neoplasm of left breast, primary tumor staging category Tis: lobular carcinoma in situ (LCIS)     SUMMARY OF ONCOLOGIC HISTORY:   Chronic lymphocytic leukemia (CLL), B-cell ([place])   02/08/2016 Initial Diagnosis    Chronic lymphocytic leukemia (CLL), B-cell (HCC)     CHIEF COMPLIANT: Follow-up on tamoxifen and CLL  INTERVAL HISTORY: Colleen Shannon is a 62 year old with above-mentioned history of CLL with cervical lymphadenopathy and lymphocytosis who is currently on surveillance.  She is here for blood work checkup.  She reports no evidence of clinical symptoms of CLL.  Denies any fevers chills night sweats or weight loss.  Cervical lymph nodes are stable as well.  She has been tolerating tamoxifen reasonably well.  She is a 10 mg daily.  She does have muscle stiffness and achiness.  She exercises and it seems to get better.  Denies any lumps or nodules in the breast.  Mammograms are done at [facility].  REVIEW OF SYSTEMS:   Constitutional: Denies fevers, chills or abnormal weight loss Eyes: Denies blurriness of vision Ears, nose, mouth, throat, and face: Denies mucositis or sore throat Respiratory: Denies cough, dyspnea or wheezes Cardiovascular: Denies palpitation, chest discomfort Gastrointestinal:  Denies nausea, heartburn or change in bowel habits Skin: Denies abnormal skin rashes Lymphatics: Denies new lymphadenopathy or easy bruising Neurological:Denies numbness, tingling or new weaknesses Behavioral/Psych: Mood is stable, no new changes  Extremities: No lower extremity edema Breast:  denies any pain or lumps or nodules in either breasts All other systems were reviewed with the patient and are negative.  I have reviewed the past medical history, past surgical history,  social history and family history with the patient and they are unchanged from previous note.  ALLERGIES:  has No Known Allergies.  MEDICATIONS:  Current Outpatient Medications  Medication Sig Dispense Refill  . acetaminophen (TYLENOL) 325 MG tablet Take 650 mg by mouth once as needed for mild pain or headache.    Marland Kitchen amLODipine (NORVASC) 5 MG tablet Take 1 tablet (5 mg total) by mouth at bedtime. 90 tablet 3  . Ascorbic Acid (VITAMIN C PO) Take 900 mg by mouth daily.    Marland Kitchen aspirin EC 81 MG tablet Take 81 mg by mouth every morning.    Marland Kitchen atorvastatin (LIPITOR) 20 MG tablet Take 1 tablet (20 mg total) by mouth daily. 90 tablet 3  . Calcium Carb-Cholecalciferol (CALCIUM-VITAMIN D3) 600-400 MG-UNIT TABS Take 1 tablet by mouth 2 (two) times daily.    . carvedilol (COREG) 12.5 MG tablet Take 1 tablet (12.5 mg total) by mouth 2 (two) times daily with a meal. Can take half tab (6.25mg ) as needed for palpitations 100 tablet 3  . ezetimibe (ZETIA) 10 MG tablet Take 1 tablet (10 mg total) by mouth daily. 90 tablet 3  . IBUPROFEN PO Take 2-3 tablets by mouth every morning.     Marland Kitchen NAPROXEN PO Take 1 tablet by mouth as needed.    . Prenatal Multivit-Min-Fe-FA (PRENATAL VITAMINS PO) Take 1 tablet by mouth daily.    Marland Kitchen PROAIR HFA 108 (90 BASE) MCG/ACT inhaler 1-2 puffs every 4 (four) hours as needed.   0  . tamoxifen (NOLVADEX) 20 MG tablet Take 0.5 tablets (10 mg total) by mouth daily. 45 tablet 3   No current facility-administered  medications for this visit.     PHYSICAL EXAMINATION: ECOG PERFORMANCE STATUS: 1 - Symptomatic but completely ambulatory  Vitals:   05/21/18 0810  BP: 126/66  Pulse: 78  Resp: 17  Temp: 99.1 F (37.3 C)  SpO2: 97%   Filed Weights   05/21/18 0810  Weight: 149 lb 12.8 oz (67.9 kg)    GENERAL:alert, no distress and comfortable SKIN: skin color, texture, turgor are normal, no rashes or significant lesions EYES: normal, Conjunctiva are pink and non-injected, sclera  clear OROPHARYNX:no exudate, no erythema and lips, buccal mucosa, and tongue normal  NECK: supple, thyroid normal size, non-tender, without nodularity LYMPH:  no palpable lymphadenopathy in the cervical, axillary or inguinal LUNGS: clear to auscultation and percussion with normal breathing effort HEART: regular rate & rhythm and no murmurs and no lower extremity edema ABDOMEN:abdomen soft, non-tender and normal bowel sounds MUSCULOSKELETAL:no cyanosis of digits and no clubbing  NEURO: alert & oriented x 3 with fluent speech, no focal motor/sensory deficits EXTREMITIES: No lower extremity edema BREAST: No palpable masses or nodules in either right or left breasts. No palpable axillary supraclavicular or infraclavicular adenopathy no breast tenderness or nipple discharge. (exam performed in the presence of a chaperone)  LABORATORY DATA:  I have reviewed the data as listed CMP Latest Ref Rng & Units 11/22/2017 08/20/2017 08/09/2017  Glucose 70 - 140 mg/dL 109 125 132  BUN 7 - 26 mg/dL 19 11.9 10.9  Creatinine 0.60 - 1.10 mg/dL 0.77 0.8 0.7  Sodium 136 - 145 mmol/L 141 143 139  Potassium 3.5 - 5.1 mmol/L 5.1 4.5 3.7  Chloride 98 - 109 mmol/L 108 - -  CO2 22 - 29 mmol/L 27 28 24   Calcium 8.4 - 10.4 mg/dL 8.8 9.2 8.9  Total Protein 6.4 - 8.3 g/dL 6.0(L) 6.3(L) -  Total Bilirubin 0.2 - 1.2 mg/dL 0.4 0.31 -  Alkaline Phos 40 - 150 U/L 31(L) 42 -  AST 5 - 34 U/L 22 19 -  ALT 0 - 55 U/L 18 18 -    Lab Results  Component Value Date   WBC 35.6 (H) 05/21/2018   HGB 12.7 05/21/2018   HCT 39.4 05/21/2018   MCV 96.6 05/21/2018   PLT 152 05/21/2018   NEUTROABS PENDING 05/21/2018    ASSESSMENT & PLAN:  Chronic lymphocytic leukemia (CLL), B-cell (HCC) Lymphocytosis with cervical lymphadenopathy, flow cytometry revealed B-cell CLL CD5 and CD23 positive Stage I No B symptoms Lab review: WBC count 35.6.  Differential is pending.  Prognosis of CLL: FISH panel revealed + 12 and -13 Q Which are  indicative of favorable prognosis.  No indications for treatment.  Neoplasm of left breast, primary tumor staging category Tis: lobular carcinoma in situ (LCIS) Left breast LCIS status post lumpectomy 10/06/2014; Started tamoxifen for breast cancer risk reduction 20 mg daily 11/18/2014 , decrease to 10 mg on 11/22/2017 plan is to treat for 5 years   Tamoxifen toxicities:  1. Hot flashes 6-8 per day : Patient is able to handle the 2.Patient complains of diffuse myalgias and stiffness: In spite of reducing the dosage of tamoxifen the symptoms are continuing.  Could be related to Lipitor.  Breast cancer surveillance: [facility]  Return to clinic in 6 months for follow-up    No orders of the defined types were placed in this encounter.  The patient has a good understanding of the overall plan. she agrees with it. she will call with any problems that may develop before the next  visit here.   Harriette Ohara, MD 05/21/18

## 2018-05-21 NOTE — Telephone Encounter (Signed)
Gave avs and calendar Md on vacation

## 2018-05-21 NOTE — Assessment & Plan Note (Signed)
Lymphocytosis with cervical lymphadenopathy, flow cytometry revealed B-cell CLL CD5 and CD23 positive Stage I No B symptoms Lab review:  Prognosis of CLL: FISH panel revealed + 12 and -13 Q Which are indicative of favorable prognosis.  No indications for treatment.

## 2018-05-22 ENCOUNTER — Encounter: Payer: Self-pay | Admitting: Hematology and Oncology

## 2018-05-24 ENCOUNTER — Telehealth: Payer: Self-pay

## 2018-05-24 NOTE — Telephone Encounter (Signed)
My chart email from patient:  Please have someone call me with my lymphocyte results from Monday. The results were not available at my appt Monday.  Thank you,  Colleen Shannon   Outgoing call to patient, no answer.  Left patient a detailed message and responded to her email with the results.  I will route note to Dr Lindi Adie and if he needs to touch base with her as well.

## 2018-05-29 DIAGNOSIS — Z0000 Encounter for general adult medical examination without abnormal findings: Secondary | ICD-10-CM | POA: Diagnosis not present

## 2018-05-29 DIAGNOSIS — E559 Vitamin D deficiency, unspecified: Secondary | ICD-10-CM | POA: Diagnosis not present

## 2018-05-29 DIAGNOSIS — R739 Hyperglycemia, unspecified: Secondary | ICD-10-CM | POA: Diagnosis not present

## 2018-06-19 ENCOUNTER — Other Ambulatory Visit: Payer: Self-pay | Admitting: Physician Assistant

## 2018-07-05 DIAGNOSIS — L723 Sebaceous cyst: Secondary | ICD-10-CM | POA: Diagnosis not present

## 2018-07-05 DIAGNOSIS — Z85828 Personal history of other malignant neoplasm of skin: Secondary | ICD-10-CM | POA: Diagnosis not present

## 2018-07-05 DIAGNOSIS — L570 Actinic keratosis: Secondary | ICD-10-CM | POA: Diagnosis not present

## 2018-07-15 DIAGNOSIS — Z79899 Other long term (current) drug therapy: Secondary | ICD-10-CM | POA: Diagnosis not present

## 2018-07-15 DIAGNOSIS — I10 Essential (primary) hypertension: Secondary | ICD-10-CM | POA: Diagnosis not present

## 2018-07-15 LAB — COMPREHENSIVE METABOLIC PANEL
ALT: 18 IU/L (ref 0–32)
AST: 19 IU/L (ref 0–40)
Albumin/Globulin Ratio: 2.8 — ABNORMAL HIGH (ref 1.2–2.2)
Albumin: 4.4 g/dL (ref 3.6–4.8)
Alkaline Phosphatase: 35 IU/L — ABNORMAL LOW (ref 39–117)
BUN/Creatinine Ratio: 20 (ref 12–28)
BUN: 15 mg/dL (ref 8–27)
Bilirubin Total: 0.4 mg/dL (ref 0.0–1.2)
CALCIUM: 9.2 mg/dL (ref 8.7–10.3)
CO2: 25 mmol/L (ref 20–29)
CREATININE: 0.74 mg/dL (ref 0.57–1.00)
Chloride: 103 mmol/L (ref 96–106)
GFR, EST AFRICAN AMERICAN: 101 mL/min/{1.73_m2} (ref >59)
GFR, EST NON AFRICAN AMERICAN: 88 mL/min/{1.73_m2} (ref >59)
GLOBULIN, TOTAL: 1.6 g/dL (ref 1.5–4.5)
Glucose: 101 mg/dL — ABNORMAL HIGH (ref 65–99)
Potassium: 4.8 mmol/L (ref 3.5–5.2)
Sodium: 143 mmol/L (ref 134–144)
TOTAL PROTEIN: 6 g/dL (ref 6.0–8.5)

## 2018-07-15 LAB — LIPID PANEL
CHOL/HDL RATIO: 2.3 ratio (ref 0.0–4.4)
Cholesterol, Total: 131 mg/dL (ref 100–199)
HDL: 56 mg/dL (ref >39)
LDL CALC: 58 mg/dL (ref 0–99)
TRIGLYCERIDES: 84 mg/dL (ref 0–149)
VLDL Cholesterol Cal: 17 mg/dL (ref 5–40)

## 2018-07-25 ENCOUNTER — Other Ambulatory Visit: Payer: Self-pay | Admitting: Cardiovascular Disease

## 2018-07-25 DIAGNOSIS — I10 Essential (primary) hypertension: Secondary | ICD-10-CM

## 2018-07-25 DIAGNOSIS — Z79899 Other long term (current) drug therapy: Secondary | ICD-10-CM

## 2018-07-25 MED ORDER — EZETIMIBE 10 MG PO TABS: 10.0000 mg | ORAL_TABLET | Freq: Every day | ORAL | 3 refills | Status: DC

## 2018-07-25 NOTE — Telephone Encounter (Signed)
° ° °*  STAT* If patient is at the pharmacy, call can be transferred to refill team.   1. Which medications need to be refilled? (please list name of each medication and dose if known) ezetimibe (ZETIA) 10 MG tablet  2. Which pharmacy/location (including street and city if local pharmacy) is medication to be sent to? [place], [place]  3. Do they need a 30 day or 90 day supply? [place]

## 2018-08-28 ENCOUNTER — Other Ambulatory Visit: Payer: Self-pay | Admitting: Cardiovascular Disease

## 2018-08-29 ENCOUNTER — Encounter: Payer: Self-pay | Admitting: Cardiovascular Disease

## 2018-08-29 ENCOUNTER — Ambulatory Visit (INDEPENDENT_AMBULATORY_CARE_PROVIDER_SITE_OTHER): Payer: BLUE CROSS/BLUE SHIELD | Admitting: Cardiovascular Disease

## 2018-08-29 VITALS — BP 120/60 | HR 69 | Ht 61.0 in | Wt 149.6 lb

## 2018-08-29 DIAGNOSIS — C9110 Chronic lymphocytic leukemia of B-cell type not having achieved remission: Secondary | ICD-10-CM

## 2018-08-29 DIAGNOSIS — Z8249 Family history of ischemic heart disease and other diseases of the circulatory system: Secondary | ICD-10-CM | POA: Diagnosis not present

## 2018-08-29 DIAGNOSIS — I10 Essential (primary) hypertension: Secondary | ICD-10-CM

## 2018-08-29 DIAGNOSIS — Z86000 Personal history of in-situ neoplasm of breast: Secondary | ICD-10-CM

## 2018-08-29 DIAGNOSIS — E785 Hyperlipidemia, unspecified: Secondary | ICD-10-CM | POA: Diagnosis not present

## 2018-08-29 NOTE — Patient Instructions (Signed)
Medication Instructions:  Your physician recommends that you continue on your current medications as directed. Please refer to the Current Medication list given to you today.  If you need a refill on your cardiac medications before your next appointment, please call your pharmacy.    Follow-Up: At [facility], you and your health needs are our priority.  As part of our continuing mission to provide you with exceptional heart care, we have created designated Provider Care Teams.  These Care Teams include your primary Cardiologist (physician) and Advanced Practice Providers (APPs -  Physician Assistants and Nurse Practitioners) who all work together to provide you with the care you need, when you need it. You will need a follow up appointment in 12 months.  Please call our office 2 months in advance to schedule this appointment.  You may see Thomas Eriyana, MD or one of the following Advanced Practice Providers on your designated Care Team: Hao Meng, PA-C . Angela Duke, PA-C  

## 2018-08-29 NOTE — Progress Notes (Signed)
Patient ID: Colleen Shannon, female   DOB: 12-Oct-1955, 62 y.o.   MRN: [id number]     HPI: Colleen Shannon is a 62 y.o. female presents to the office today for a 4 month cardiology evaluation.  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  Colleen Shannon has a history of hypertension and previous chest pain for which he was started on carvedilol and initially nifedipine by Dr. Elisabeth Cara with a presumptive dose of possible spasm. In August 2013 a nuclear perfusion study was entirely normal. 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 She has been on lipid-lowering therapy with atorvastatin. When I saw her 2 years ago I discontinued her nifedipine and switched her to amlodipine. She states her blood pressure has been controlled on this regimen. An echo Doppler study in August 2013 showed normal systolic function. She did have mild mitral regurgitation.  Over the past year, she has done well from a cardiac standpoint.  She denies any episodes of chest pain.  He denies palpitations.  She is now followed by Dr. Carol Ada for primary care.  Recent laboratory had been drawn for which she was told was excellent.  Colleen Shannon underwent right knee replacement surgery by Dr. Sandie Ano on 05/11/2014.  She tolerated that well from a cardiovascular standpoint.  She underwent knee replacement to her left knee in November 2017.  She also was diagnosed as having CLL.  She is followed by Dr. Lindi Adie. .  She continues to take tamoxifen for previous carcinoma in situ on breast biopsy.   I last saw her in January 2019 at which time she had noticed palpitations which may occur 4-5 days per week.  These typically are isolated and last for approximately 20 seconds.  She denies any sustained episodes of tachycardia.  She denies chest pain.  I recommended slight titration of her current date of carvedilol up to 9.375 mg  twice a day.  Her blood pressure was stable and she was advised to continue her current dose of amlodipine.    Follow-up echo Doppler study in February 2019 which showed an EF of 55 to 60% with grade 1 diastolic dysfunction.  There was mild mitral regurgitation.  Pulmonary pressures were normal.    She was seen in April 2019 by Almyra Deforest, PA and had noted that her baseline heart rate had been increasing to the 80s.  She was drinking 1 cup of coffee per day.  Carvedilol was further increased to 12.5 mg twice a day.  When I last saw her in July 2019 she denied any episodes of chest pain but was concerned since her younger brother had suffered an MI and had required insertion of for coronary stents.  During that evaluation she was inquiring about screening for CAD.   She underwent a CT cardiac scoring study which showed a coronary calcium score of 0.  Her chest CT did show diffuse bronchial wall thickening.  She states as result of her CLL she does have episodes of bronchitis frequently.  She continues to be active.  Unfortunately her brother died at age 35 and was ultimately found to have a pituitary adenoma with development of Cushing's disease and 12 days following his resection of his adenoma he apparently had a bleed requiring mechanical ventilation and ultimately developed multisystem organ failure leading to his death.  This has been stressful for [name].  She denies any chest pain.  She continues to work.  She presents for reevaluation.   Past Medical History:  Diagnosis Date  . Arthritis   . Cancer (HCC)    hx of skin cancer   . Elevated cholesterol 05/22/12   Nuc stress test-Normal. The patiet had a markedly positive GXT for ischemia. post stress EF 72%  . Endometrial polyp   . Hypertension 05/22/12   ECHO-EF >55% trace tricupsid regurgitation. There is mild mitral regurgitation.     Past Surgical History:  Procedure Laterality Date  . ABDOMINAL SURGERY     Abdominoplasty  .  CESAREAN SECTION     X 2  . CHOLECYSTECTOMY    . COLONSCOPY    . DILATION AND CURETTAGE OF UTERUS    . FOOT SURGERY    . HYSTEROSCOPY    . KNEE CLOSED REDUCTION Right 07/20/2014   Procedure: RIGHT KNEE CLOSED MANIPULATION;  Surgeon: [name], MD;  Location: [facility];  Service: Orthopedics;  Laterality: Right;  . KNEE SURGERY     arthroscopic  . RADIOACTIVE SEED GUIDED EXCISIONAL BREAST BIOPSY N/A 10/06/2014   Procedure: RADIOACTIVE SEED GUIDED EXCISIONAL BREAST BIOPSY;  Surgeon: Rolm Bookbinder, MD;  Location: [facility];  Service: General;  Laterality: N/A;  . TOTAL KNEE ARTHROPLASTY Right 05/11/2014   Procedure: RIGHT TOTAL KNEE ARTHROPLASTY;  Surgeon: [name], MD;  Location: [facility];  Service: Orthopedics;  Laterality: Right;  . TUBAL LIGATION      No Known Allergies  Current Outpatient Medications  Medication Sig Dispense Refill  . acetaminophen (TYLENOL) 325 MG tablet Take 650 mg by mouth once as needed for mild pain or headache.    . Ascorbic Acid (VITAMIN C PO) Take 900 mg by mouth daily.    Marland Kitchen aspirin EC 81 MG tablet Take 81 mg by mouth every morning.    Marland Kitchen atorvastatin (LIPITOR) 20 MG tablet Take 1 tablet (20 mg total) by mouth daily. 90 tablet 3  . Calcium Carb-Cholecalciferol (CALCIUM-VITAMIN D3) 600-400 MG-UNIT TABS Take 1 tablet by mouth 2 (two) times daily.    . carvedilol (COREG) 12.5 MG tablet TAKE 1 TABLET BY MOUTH  TWICE A DAY WITH A MEAL ,  CAN TAKE 1/2 TABLET AS  NEEDED FOR PALPITATIONS 180 tablet 2  . Cholecalciferol (CVS VIT D 5000 HIGH-POTENCY PO) Take 1 capsule by mouth daily.    Marland Kitchen ezetimibe (ZETIA) 10 MG tablet Take 1 tablet (10 mg total) by mouth daily. 90 tablet 3  . IBUPROFEN PO Take 2-3 tablets by mouth daily as needed.     Marland Kitchen NAPROXEN PO Take 1 tablet by mouth as needed.    . Prenatal Multivit-Min-Fe-FA (PRENATAL VITAMINS PO) Take 1 tablet by mouth daily.    Marland Kitchen PROAIR HFA 108 (90 BASE) MCG/ACT inhaler 1-2 puffs every 4 (four) hours  as needed.   0  . tamoxifen (NOLVADEX) 20 MG tablet Take 0.5 tablets (10 mg total) by mouth daily. 45 tablet 3  . amLODipine (NORVASC) 5 MG tablet TAKE 1 TABLET BY MOUTH AT  BEDTIME 90 tablet 3   No current facility-administered medications for this visit.     Social History   Socioeconomic History  . Marital status: Married    Spouse name: Not on file  . Number of children: Not on file  . Years of education: Not on file  . Highest education level: Not on file  Occupational History  . Not on file  Social  Needs  . Financial resource strain: Not on file  . Food insecurity:    Worry: Not on file    Inability: Not on file  . Transportation needs:    Medical: Not on file    Non-medical: Not on file  Tobacco Use  . Smoking status: Never Smoker  . Smokeless tobacco: Never Used  Substance and Sexual Activity  . Alcohol use: Yes    Alcohol/week: 1.0 standard drinks    Types: 1 Standard drinks or equivalent per week    Comment: occasional   . Drug use: No  . Sexual activity: Yes    Birth control/protection: Surgical, Post-menopausal  Lifestyle  . Physical activity:    Days per week: Not on file    Minutes per session: Not on file  . Stress: Not on file  Relationships  . Social connections:    Talks on phone: Not on file    Gets together: Not on file    Attends religious service: Not on file    Active member of club or organization: Not on file    Attends meetings of clubs or organizations: Not on file    Relationship status: Not on file  . Intimate partner violence:    Fear of current or ex partner: Not on file    Emotionally abused: Not on file    Physically abused: Not on file    Forced sexual activity: Not on file  Other Topics Concern  . Not on file  Social History Narrative  . Not on file   Additional social history is notable that she is married for 33 years. 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 There is no tobacco  use. He does drink occasional wine. .  She is status post bilateral knee replacements.  She has resumed exercise.  Family History  Problem Relation Age of Onset  . Heart disease Father   . Aneurysm Mother   . Hyperlipidemia Mother   . Hypertension Brother   . Cancer - Other Paternal Grandmother        uterine  . Hypertension Paternal Grandfather   . Stroke Paternal Grandfather   . Heart disease Maternal Grandfather   . Cancer - Other Maternal Grandfather        lung   ROS General: Negative; No fevers, chills, or night sweats;  HEENT: Negative; No changes in vision or hearing, sinus congestion, difficulty swallowing Pulmonary: Negative; No cough, wheezing, shortness of breath, hemoptysis Cardiovascular: Negative; No chest pain, presyncope, syncope, palpitations GI: Negative; No nausea, vomiting, diarrhea, or abdominal pain GU: Negative; No dysuria, hematuria, or difficulty voiding Musculoskeletal: Positive for bilateral knee replacement surgery;positive for osteoarthritis no myalgias, Hematologic/Oncology: positive for CLL Endocrine: Negative; no heat/cold intolerance; no diabetes Neuro: Negative; no changes in balance, headaches Skin: Negative; No rashes or skin lesions Psychiatric: Negative; No behavioral problems, depression Sleep: Negative; No snoring, daytime sleepiness, hypersomnolence, bruxism, restless legs, hypnogognic hallucinations, no cataplexy Other comprehensive 14 point system review is negative.  PE BP 120/60   Pulse 69   Ht 5' 1"  (1.549 m)   Wt 149 lb 9.6 oz (67.9 kg)   LMP 11/19/2011   BMI 28.27 kg/m    Repeat blood pressure by me 114/64  Wt Readings from Last 3 Encounters:  08/29/18 149 lb 9.6 oz (67.9 kg)  05/21/18 149 lb 12.8 oz (67.9 kg)  04/26/18 151 lb (68.5 kg)   General: Alert, oriented, no distress.  Skin:  normal turgor, no rashes, warm and dry HEENT: Normocephalic, atraumatic. Pupils equal round and reactive to light; sclera anicteric;  extraocular muscles intact;  Nose without nasal septal hypertrophy Mouth/Parynx benign; Mallinpatti scale 2 Neck: No JVD, no carotid bruits; normal carotid upstroke Lungs: clear to ausculatation and percussion; no wheezing or rales Chest wall: without tenderness to palpitation Heart: PMI not displaced, RRR, s1 s2 normal, 1/6 systolic murmur, no diastolic murmur, no rubs, gallops, thrills, or heaves Abdomen: soft, nontender; no hepatosplenomehaly, BS+; abdominal aorta nontender and not dilated by palpation. Back: no CVA tenderness Pulses 2+ Musculoskeletal: full range of motion, normal strength, no joint deformities Extremities: no clubbing cyanosis or edema, Homan's sign negative  Neurologic: grossly nonfocal; Cranial nerves grossly wnl Psychologic: Normal mood and affect   ECG (independently read by me): Normal sinus rhythm at 69 bpm.  No ectopy.  Normal intervals.  July 2019 ECG (independently read by me): Normal sinus rhythm at 64 bpm.  No ectopy.  Normal intervals.  No ST segment changes.  January 2019 ECG (independently read by me): normal sinus rhythm at 76 bpm.  Normal intervals.  No ectopy or ST changes  November 2016 ECG (independently read by me) from 07/17/2014: Normal sinus rhythm.  No ectopy.  Normal intervals.  No ST segment changes.  August 2014 ECG: Normal sinus rhythm at 75 beats per minute without ectopy. Normal intervals.  LABS: BMP Latest Ref Rng & Units 07/15/2018 05/21/2018 11/22/2017  Glucose 65 - 99 mg/dL 101(H) 124(H) 109  BUN 8 - 27 mg/dL 15 15 19   Creatinine 0.57 - 1.00 mg/dL 0.74 0.78 0.77  BUN/Creat Ratio 12 - 28 20 - -  Sodium 134 - 144 mmol/L 143 144 141  Potassium 3.5 - 5.2 mmol/L 4.8 4.4 5.1  Chloride 96 - 106 mmol/L 103 106 108  CO2 20 - 29 mmol/L 25 27 27   Calcium 8.7 - 10.3 mg/dL 9.2 9.5 8.8   Hepatic Function Latest Ref Rng & Units 07/15/2018 05/21/2018 11/22/2017  Total Protein 6.0 - 8.5 g/dL 6.0 6.4(L) 6.0(L)  Albumin 3.6 - 4.8 g/dL 4.4 4.0 3.8   AST 0 - 40 IU/L 19 20 22   ALT 0 - 32 IU/L 18 20 18   Alk Phosphatase 39 - 117 IU/L 35(L) 37(L) 31(L)  Total Bilirubin 0.0 - 1.2 mg/dL 0.4 0.4 0.4   CBC Latest Ref Rng & Units 05/21/2018 11/22/2017 08/20/2017  WBC 3.9 - 10.3 K/uL 35.6(H) 28.0(H) 31.8(H)  Hemoglobin 11.6 - 15.9 g/dL 12.7 12.4 12.4  Hematocrit 34.8 - 46.6 % 39.4 38.1 38.3  Platelets 145 - 400 K/uL 152 174 225   Lab Results  Component Value Date   MCV 96.6 05/21/2018   MCV 94.9 11/22/2017   MCV 96.5 08/20/2017   Lab Results  Component Value Date   TSH 1.860 01/30/2018   Lipid Panel     Component Value Date/Time   CHOL 131 07/15/2018 0830   TRIG 84 07/15/2018 0830   HDL 56 07/15/2018 0830   CHOLHDL 2.3 07/15/2018 0830   CHOLHDL 2.3 08/19/2015 0857   VLDL 17 08/19/2015 0857   LDLCALC 58 07/15/2018 0830    RADIOLOGY: No results found.  IMPRESSION: 1. Essential hypertension   2. Family history of ischemic heart disease   3. CLL (chronic lymphocytic leukemia) ([place])   4. Hyperlipidemia with target LDL less than 70   5. History of carcinoma in situ of breast     ASSESSMENT AND PLAN: Colleen Shannon has a history of hypertension,  and remotely had experienced chest pain.  In August 2013 a nuclear perfusion study was normal.  Not having seen her in over 2 years, when I saw her in January 2019, her blood pressure was mildly elevated based on recent change in guidelines and she was experiencing some episodic palpitations.  Her palpitations improved with slight titration of carvedilol and when seen several months later her dose was further titrated to 12.5 mg twice a day.  On exam today her blood pressure is stable at 114/64 and she is continued to be on amlodipine 5 mg, carvedilol 12.5 mg twice a day.  When I last saw her she was experiencing some myalgias on atorvastatin 40 mg and her dose was reduced to 20 mg and she was started on Zetia 10 mg.  Most recent lipid studies of July 15, 2018 are excellent with total  cholesterol 131, LDL cholesterol 58.  HDL is 56 and triglycerides 84.  As part of her cardiovascular screening she underwent a CT cardiac scoring evaluation.  Coronary calcification score was 0.  She was found to have diffuse bronchial wall thickening on her chest CT.  She has continued to be active.  She is not having any anginal symptoms.  She continues to be on tamoxifen for her prior carcinoma in situ of her breast.  I will see her in 1 year for reevaluation.   Time spent: 25 minutes Troy Sine, MD, [facility]  08/30/2018 1:38 PM

## 2018-08-30 ENCOUNTER — Encounter: Payer: Self-pay | Admitting: Cardiovascular Disease

## 2018-09-27 DIAGNOSIS — Z13820 Encounter for screening for osteoporosis: Secondary | ICD-10-CM | POA: Diagnosis not present

## 2018-10-03 DIAGNOSIS — C9110 Chronic lymphocytic leukemia of B-cell type not having achieved remission: Secondary | ICD-10-CM | POA: Diagnosis not present

## 2018-10-03 DIAGNOSIS — Z853 Personal history of malignant neoplasm of breast: Secondary | ICD-10-CM | POA: Diagnosis not present

## 2018-10-07 ENCOUNTER — Other Ambulatory Visit: Payer: Self-pay | Admitting: Hematology and Oncology

## 2018-11-15 DIAGNOSIS — M79672 Pain in left foot: Secondary | ICD-10-CM | POA: Insufficient documentation

## 2018-11-18 DIAGNOSIS — M722 Plantar fascial fibromatosis: Secondary | ICD-10-CM | POA: Diagnosis not present

## 2018-11-18 DIAGNOSIS — M79672 Pain in left foot: Secondary | ICD-10-CM | POA: Diagnosis not present

## 2018-11-20 DIAGNOSIS — D2271 Melanocytic nevi of right lower limb, including hip: Secondary | ICD-10-CM | POA: Diagnosis not present

## 2018-11-20 DIAGNOSIS — D220 Melanocytic nevi of lip: Secondary | ICD-10-CM | POA: Diagnosis not present

## 2018-11-20 DIAGNOSIS — Z85828 Personal history of other malignant neoplasm of skin: Secondary | ICD-10-CM | POA: Diagnosis not present

## 2018-11-26 NOTE — Progress Notes (Signed)
Patient Care Team: Carol Ada, MD as PCP - General (Family Medicine) Troy Sine, MD as PCP - Cardiology (Cardiology)  DIAGNOSIS:    ICD-10-CM   1. Neoplasm of left breast, primary tumor staging category Tis: lobular carcinoma in situ (LCIS) D05.02   2. Chronic lymphocytic leukemia of B-cell type not having achieved remission (HCC) C91.10 CBC with Differential (Cancer Center Only)    Lactate dehydrogenase (LDH)    CMP (Cancer Center only)    SUMMARY OF ONCOLOGIC HISTORY:   Chronic lymphocytic leukemia (CLL), B-cell ([place])   02/08/2016 Initial Diagnosis    Chronic lymphocytic leukemia (CLL), B-cell ([place])     CHIEF COMPLIANT: Follow-up on tamoxifen and CLL  INTERVAL HISTORY: Colleen Shannon is a 63 y.o. with above-mentioned history of CLL with cervical lymphadenopathy and lymphocytosis who is currently on surveillance and on tamoxifen therapy 10mg  daily. She presents to the clinic alone today and is doing well. She denies any fever, bleeding, sweats, or weight loss. She occasionally feels the lymph nodes under her arm increase in size significantly, especially after recently receiving a flu and shingles shot, but reports today she cannot feel them. Her labs from today show: WBC 48.4, Hg 11.9, platelets 145. She recently cut back working to two days a week and is sleeping and exercising more. She reviewed her medication list with me.   REVIEW OF SYSTEMS:   Constitutional: Denies fevers, chills or abnormal weight loss Eyes: Denies blurriness of vision Ears, nose, mouth, throat, and face: Denies mucositis or sore throat Respiratory: Denies cough, dyspnea or wheezes Cardiovascular: Denies palpitation, chest discomfort Gastrointestinal: Denies nausea, heartburn or change in bowel habits Skin: Denies abnormal skin rashes Lymphatics: Denies new lymphadenopathy or easy bruising Neurological: Denies numbness, tingling or new weaknesses Behavioral/Psych: Mood is stable, no new changes    Extremities: No lower extremity edema Breast: denies any pain or lumps or nodules in either breasts All other systems were reviewed with the patient and are negative.  I have reviewed the past medical history, past surgical history, social history and family history with the patient and they are unchanged from previous note.  ALLERGIES:  has No Known Allergies.  MEDICATIONS:  Current Outpatient Medications  Medication Sig Dispense Refill  . acetaminophen (TYLENOL) 325 MG tablet Take 650 mg by mouth once as needed for mild pain or headache.    Marland Kitchen amLODipine (NORVASC) 5 MG tablet TAKE 1 TABLET BY MOUTH AT  BEDTIME 90 tablet 3  . Ascorbic Acid (VITAMIN C PO) Take 900 mg by mouth daily.    Marland Kitchen aspirin EC 81 MG tablet Take 81 mg by mouth every morning.    Marland Kitchen atorvastatin (LIPITOR) 20 MG tablet Take 1 tablet (20 mg total) by mouth daily. 90 tablet 3  . Calcium Carb-Cholecalciferol (CALCIUM-VITAMIN D3) 600-400 MG-UNIT TABS Take 1 tablet by mouth 2 (two) times daily.    . carvedilol (COREG) 12.5 MG tablet TAKE 1 TABLET BY MOUTH  TWICE A DAY WITH A MEAL ,  CAN TAKE 1/2 TABLET AS  NEEDED FOR PALPITATIONS 180 tablet 2  . Cholecalciferol (CVS VIT D 5000 HIGH-POTENCY PO) Take 1 capsule by mouth daily.    Marland Kitchen ezetimibe (ZETIA) 10 MG tablet Take 1 tablet (10 mg total) by mouth daily. 90 tablet 3  . IBUPROFEN PO Take 2-3 tablets by mouth daily as needed.     Marland Kitchen NAPROXEN PO Take 1 tablet by mouth as needed.    . Prenatal Multivit-Min-Fe-FA (PRENATAL VITAMINS PO) Take  1 tablet by mouth daily.    Marland Kitchen PROAIR HFA 108 (90 BASE) MCG/ACT inhaler 1-2 puffs every 4 (four) hours as needed.   0  . tamoxifen (NOLVADEX) 20 MG tablet Take 0.5 tablets (10 mg total) by mouth daily. 45 tablet 3   No current facility-administered medications for this visit.     PHYSICAL EXAMINATION: ECOG PERFORMANCE STATUS: 0 - Asymptomatic  Vitals:   11/28/18 0814  BP: (!) 121/59  Pulse: 71  Resp: 17  Temp: 98 F (36.7 C)  SpO2: 98%    Filed Weights   11/28/18 0814  Weight: 147 lb 4.8 oz (66.8 kg)    GENERAL: alert, no distress and comfortable SKIN: skin color, texture, turgor are normal, no rashes or significant lesions EYES: normal, Conjunctiva are pink and non-injected, sclera clear OROPHARYNX: no exudate, no erythema and lips, buccal mucosa, and tongue normal  NECK: supple, thyroid normal size, non-tender, without nodularity  LYMPH: no palpable lymphadenopathy in the cervical, axillary or inguinal LUNGS: clear to auscultation and percussion with normal breathing effort HEART: regular rate & rhythm and no murmurs and no lower extremity edema ABDOMEN: abdomen soft, non-tender and normal bowel sounds MUSCULOSKELETAL: no cyanosis of digits and no clubbing  NEURO: alert & oriented x 3 with fluent speech, no focal motor/sensory deficits EXTREMITIES: No lower extremity edema  LABORATORY DATA:  I have reviewed the data as listed CMP Latest Ref Rng & Units 07/15/2018 05/21/2018 11/22/2017  Glucose 65 - 99 mg/dL 101(H) 124(H) 109  BUN 8 - 27 mg/dL 15 15 19   Creatinine 0.57 - 1.00 mg/dL 0.74 0.78 0.77  Sodium 134 - 144 mmol/L 143 144 141  Potassium 3.5 - 5.2 mmol/L 4.8 4.4 5.1  Chloride 96 - 106 mmol/L 103 106 108  CO2 20 - 29 mmol/L 25 27 27   Calcium 8.7 - 10.3 mg/dL 9.2 9.5 8.8  Total Protein 6.0 - 8.5 g/dL 6.0 6.4(L) 6.0(L)  Total Bilirubin 0.0 - 1.2 mg/dL 0.4 0.4 0.4  Alkaline Phos 39 - 117 IU/L 35(L) 37(L) 31(L)  AST 0 - 40 IU/L 19 20 22   ALT 0 - 32 IU/L 18 20 18     Lab Results  Component Value Date   WBC 48.4 (H) 11/28/2018   HGB 11.9 (L) 11/28/2018   HCT 37.7 11/28/2018   MCV 99.0 11/28/2018   PLT 145 (L) 11/28/2018   NEUTROABS PENDING 11/28/2018    ASSESSMENT & PLAN:  Neoplasm of left breast, primary tumor staging category Tis: lobular carcinoma in situ (LCIS) Left breast LCIS status post lumpectomy 10/06/2014; Started tamoxifen for breast cancer risk reduction 20 mg daily 11/18/2014 , decrease to  10 mg on 11/22/2017 plan is to treat for 5 years   Tamoxifen toxicities:  1. Hot flashes 6-8 per day :Patient is able to handle the 2.Patient complains of diffuse myalgias and stiffness: In spite of reducing the dosage of tamoxifen the symptoms are continuing.  Could be related to Lipitor.  Breast cancer surveillance: Mammograms at [facility]  Return to clinic in 6 months for follow-up  Chronic lymphocytic leukemia (CLL), B-cell (HCC) CLL CD5 and CD23 positive Stage I (small cervical lymph nodes) No B symptoms Lab review: WBC count 48.4 which has increased from 35 The differential is pending.  LDH is also pending Platelet count 145  Prognosis of CLL: FISH panel revealed + 12 and -13 Q Which are indicative of favorable prognosis. No indications for treatment. Return to clinic in 6 months with labs and follow-up.  Orders Placed This Encounter  Procedures  . CBC with Differential (Cancer Center Only)    Standing Status:   Future    Standing Expiration Date:   11/29/2019  . Lactate dehydrogenase (LDH)    Standing Status:   Future    Standing Expiration Date:   11/28/2019  . CMP ([place] only)    Standing Status:   Future    Standing Expiration Date:   11/29/2019   The patient has a good understanding of the overall plan. she agrees with it. she will call with any problems that may develop before the next visit here.  Nicholas Lose, MD 11/28/2018  Julious Oka Dorshimer am acting as scribe for Dr. Nicholas Lose.  I have reviewed the above documentation for accuracy and completeness, and I agree with the above.

## 2018-11-27 ENCOUNTER — Other Ambulatory Visit: Payer: Self-pay

## 2018-11-27 DIAGNOSIS — D0502 Lobular carcinoma in situ of left breast: Secondary | ICD-10-CM

## 2018-11-27 DIAGNOSIS — C9110 Chronic lymphocytic leukemia of B-cell type not having achieved remission: Secondary | ICD-10-CM

## 2018-11-28 ENCOUNTER — Telehealth: Payer: Self-pay | Admitting: Hematology and Oncology

## 2018-11-28 ENCOUNTER — Inpatient Hospital Stay (HOSPITAL_BASED_OUTPATIENT_CLINIC_OR_DEPARTMENT_OTHER): Payer: 59 | Admitting: Hematology and Oncology

## 2018-11-28 ENCOUNTER — Inpatient Hospital Stay: Payer: 59 | Attending: Hematology and Oncology

## 2018-11-28 DIAGNOSIS — Z170 Estrogen receptor positive status [ER+]: Secondary | ICD-10-CM

## 2018-11-28 DIAGNOSIS — D0502 Lobular carcinoma in situ of left breast: Secondary | ICD-10-CM

## 2018-11-28 DIAGNOSIS — C9110 Chronic lymphocytic leukemia of B-cell type not having achieved remission: Secondary | ICD-10-CM | POA: Diagnosis not present

## 2018-11-28 DIAGNOSIS — Z79810 Long term (current) use of selective estrogen receptor modulators (SERMs): Secondary | ICD-10-CM | POA: Insufficient documentation

## 2018-11-28 LAB — CMP (CANCER CENTER ONLY)
ALT: 21 U/L (ref 0–44)
AST: 21 U/L (ref 15–41)
Albumin: 3.7 g/dL (ref 3.5–5.0)
Alkaline Phosphatase: 39 U/L (ref 38–126)
Anion gap: 9 (ref 5–15)
BUN: 14 mg/dL (ref 8–23)
CO2: 27 mmol/L (ref 22–32)
Calcium: 8.8 mg/dL — ABNORMAL LOW (ref 8.9–10.3)
Chloride: 106 mmol/L (ref 98–111)
Creatinine: 0.82 mg/dL (ref 0.44–1.00)
GFR, Est AFR Am: >60 mL/min (ref >60)
GFR, Estimated: >60 mL/min (ref >60)
Glucose, Bld: 140 mg/dL — ABNORMAL HIGH (ref 70–99)
Potassium: 4 mmol/L (ref 3.5–5.1)
SODIUM: 142 mmol/L (ref 135–145)
Total Bilirubin: 0.4 mg/dL (ref 0.3–1.2)
Total Protein: 6.1 g/dL — ABNORMAL LOW (ref 6.5–8.1)

## 2018-11-28 LAB — CBC WITH DIFFERENTIAL (CANCER CENTER ONLY)
Abs Immature Granulocytes: 0.13 10*3/uL — ABNORMAL HIGH (ref 0.00–0.07)
Basophils Absolute: 0.1 10*3/uL (ref 0.0–0.1)
Basophils Relative: 0 %
Eosinophils Absolute: 0.3 10*3/uL (ref 0.0–0.5)
Eosinophils Relative: 1 %
HCT: 37.7 % (ref 36.0–46.0)
Hemoglobin: 11.9 g/dL — ABNORMAL LOW (ref 12.0–15.0)
Immature Granulocytes: 0 %
Lymphocytes Relative: 90 %
Lymphs Abs: 43.5 10*3/uL — ABNORMAL HIGH (ref 0.7–4.0)
MCH: 31.2 pg (ref 26.0–34.0)
MCHC: 31.6 g/dL (ref 30.0–36.0)
MCV: 99 fL (ref 80.0–100.0)
MONOS PCT: 3 %
Monocytes Absolute: 1.6 10*3/uL — ABNORMAL HIGH (ref 0.1–1.0)
NEUTROS ABS: 2.8 10*3/uL (ref 1.7–7.7)
Neutrophils Relative %: 6 %
Platelet Count: 145 10*3/uL — ABNORMAL LOW (ref 150–400)
RBC: 3.81 MIL/uL — ABNORMAL LOW (ref 3.87–5.11)
RDW: 13.1 % (ref 11.5–15.5)
WBC Count: 48.4 10*3/uL — ABNORMAL HIGH (ref 4.0–10.5)
nRBC: 0 % (ref 0.0–0.2)

## 2018-11-28 LAB — LACTATE DEHYDROGENASE: LDH: 265 U/L — AB (ref 98–192)

## 2018-11-28 MED ORDER — TAMOXIFEN CITRATE 20 MG PO TABS: 10.0000 mg | ORAL_TABLET | Freq: Every day | ORAL | 3 refills | Status: DC

## 2018-11-28 NOTE — Assessment & Plan Note (Signed)
CLL CD5 and CD23 positive Stage I No B symptoms Lab review: WBC count   Prognosis of CLL: FISH panel revealed + 12 and -13 Q Which are indicative of favorable prognosis. No indications for treatment.

## 2018-11-28 NOTE — Assessment & Plan Note (Signed)
Left breast LCIS status post lumpectomy 10/06/2014; Started tamoxifen for breast cancer risk reduction 20 mg daily 11/18/2014 , decrease to 10 mg on 11/22/2017 plan is to treat for 5 years   Tamoxifen toxicities:  1. Hot flashes 6-8 per day :Patient is able to handle the 2.Patient complains of diffuse myalgias and stiffness: In spite of reducing the dosage of tamoxifen the symptoms are continuing.  Could be related to Lipitor.  Breast cancer surveillance: Mammograms at [facility]  Return to clinic in 6 months for follow-up

## 2018-11-28 NOTE — Telephone Encounter (Signed)
Gave avs and calendar ° °

## 2019-02-04 DIAGNOSIS — I10 Essential (primary) hypertension: Secondary | ICD-10-CM | POA: Diagnosis not present

## 2019-02-04 DIAGNOSIS — K219 Gastro-esophageal reflux disease without esophagitis: Secondary | ICD-10-CM | POA: Diagnosis not present

## 2019-02-04 DIAGNOSIS — R0789 Other chest pain: Secondary | ICD-10-CM | POA: Diagnosis not present

## 2019-02-06 ENCOUNTER — Telehealth: Payer: Self-pay | Admitting: *Deleted

## 2019-02-06 NOTE — Telephone Encounter (Signed)
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  The center has been closed but they are going to re open and start preforming cases.  Pt is concerned regarding her hx of CLL and breast cancer if she should return to work.  Per Dr. Lindi Adie, pt will need to be out until April 09, 2019.  If covid risk is still high after July 1st the pt will be written out for a longer time period.  Work excuse filled out and mailed to pts home address.  Pt stated she will also be in contact with her work to see if the excuse is enough or if she needs to file FMLA.  She will call us if she needs any paperwork filled out.

## 2019-02-12 ENCOUNTER — Telehealth: Payer: Self-pay | Admitting: Cardiovascular Disease

## 2019-02-12 NOTE — Telephone Encounter (Signed)
Spoke with pt and for about 4 weeks has had heartburn Pt has been taking tums and has not changed so has started taking Nexium for about  2weeks pt has noted SOB , belching, tingling ,and also more discomfort at night when lying flat.Will discuss with Dr Caterina./cy 

## 2019-02-12 NOTE — Telephone Encounter (Signed)
Patient called stating the past couple of weeks she has been experiencing some heartburn, she was taking Nexium that was helping at first but now it's not.  She has been having SOB that comes goes. Burning feeling in the middle of her chest, pain in her left breast that radiates to her left shoulder, and burping, and an occasional PVC.

## 2019-02-12 NOTE — Telephone Encounter (Signed)
Per Dr Claiborne Billings pt needs to f/u with GI Pt aware of recommendations ./cy

## 2019-02-18 DIAGNOSIS — K219 Gastro-esophageal reflux disease without esophagitis: Secondary | ICD-10-CM | POA: Diagnosis not present

## 2019-02-18 DIAGNOSIS — R140 Abdominal distension (gaseous): Secondary | ICD-10-CM | POA: Diagnosis not present

## 2019-02-18 DIAGNOSIS — R079 Chest pain, unspecified: Secondary | ICD-10-CM | POA: Diagnosis not present

## 2019-03-02 ENCOUNTER — Other Ambulatory Visit: Payer: Self-pay | Admitting: Cardiovascular Disease

## 2019-03-02 DIAGNOSIS — I10 Essential (primary) hypertension: Secondary | ICD-10-CM

## 2019-03-02 DIAGNOSIS — Z79899 Other long term (current) drug therapy: Secondary | ICD-10-CM

## 2019-03-26 ENCOUNTER — Telehealth: Payer: Self-pay

## 2019-03-26 NOTE — Telephone Encounter (Signed)
Patient requesting an extension on her return to work due to [employer] pandemic.  Pt with history of CLL and breast cancer.  Extension provided through July, return 8/1.  Per Dr. Lindi Adie will re-evaluate at that time, if further extension needed will provide based on COVID situation.    Left voicemail for patient notify of recommendations.  Letter mailed to home address.

## 2019-04-17 ENCOUNTER — Encounter: Payer: Self-pay | Admitting: Hematology and Oncology

## 2019-05-22 NOTE — Assessment & Plan Note (Signed)
Left breast LCIS status post lumpectomy 10/06/2014; Started tamoxifen for breast cancer risk reduction 20 mg daily 11/18/2014 ,decrease to 10 mg on 2/14/2019plan is to treat for 5 years which will be completed in February 2021  Tamoxifen toxicities:  1. Hot flashes 6-8 per day :Patient is able to handle the 2.Patient complains of diffuse myalgias and stiffness: In spite of reducing the dosage of tamoxifen the symptoms are continuing. Could be related to Lipitor.  Breast cancer surveillance: Mammograms at [facility]

## 2019-05-22 NOTE — Assessment & Plan Note (Signed)
CLL CD5 and CD23 positive Stage I (small cervical lymph nodes) No B symptoms Lab review:WBC count 48.4 which has increased from 35 The differential is pending.  LDH is also pending Platelet count 145  Prognosis of CLL: FISH panel revealed + 12 and -13 Q Which are indicative of favorable prognosis. No indications for treatment. Return to clinic in 6 months with labs and follow-up.

## 2019-05-26 ENCOUNTER — Other Ambulatory Visit: Payer: Self-pay | Admitting: Cardiovascular Disease

## 2019-05-26 DIAGNOSIS — Z79899 Other long term (current) drug therapy: Secondary | ICD-10-CM

## 2019-05-26 DIAGNOSIS — I10 Essential (primary) hypertension: Secondary | ICD-10-CM

## 2019-05-28 ENCOUNTER — Encounter (HOSPITAL_COMMUNITY): Payer: Self-pay

## 2019-05-28 NOTE — Progress Notes (Signed)
Patient Care Team: Carol Ada, MD as PCP - General (Family Medicine) Troy Sine, MD as PCP - Cardiology (Cardiology)  DIAGNOSIS:    ICD-10-CM   1. Neoplasm of left breast, primary tumor staging category Tis: lobular carcinoma in situ (LCIS)  D05.02   2. Chronic lymphocytic leukemia of B-cell type not having achieved remission (HCC)  C91.10     SUMMARY OF ONCOLOGIC HISTORY: Oncology History  Chronic lymphocytic leukemia (CLL), B-cell ([place])  02/08/2016 Initial Diagnosis   Chronic lymphocytic leukemia (CLL), B-cell ([facility])     CHIEF COMPLIANT: Follow-up on tamoxifen and CLL  INTERVAL HISTORY: Colleen Shannon is a 63 y.o. with above-mentioned history of CLL with cervical lymphadenopathy and lymphocytosis who is currently on surveillance. She also has a history of LCIS of the left breast and is currently on tamoxifen therapy 10mg  daily. She presents to the clinic today for follow-up.   REVIEW OF SYSTEMS:   Constitutional: Denies fevers, chills or abnormal weight loss Eyes: Denies blurriness of vision Ears, nose, mouth, throat, and face: Denies mucositis or sore throat Respiratory: Denies cough, dyspnea or wheezes Cardiovascular: Denies palpitation, chest discomfort Gastrointestinal: Denies nausea, heartburn or change in bowel habits Skin: Denies abnormal skin rashes Lymphatics: Denies new lymphadenopathy or easy bruising Neurological: Denies numbness, tingling or new weaknesses Behavioral/Psych: Mood is stable, no new changes  Extremities: No lower extremity edema Breast: denies any pain or lumps or nodules in either breasts All other systems were reviewed with the patient and are negative.  I have reviewed the past medical history, past surgical history, social history and family history with the patient and they are unchanged from previous note.  ALLERGIES:  has No Known Allergies.  MEDICATIONS:  Current Outpatient Medications  Medication Sig Dispense Refill  .  acetaminophen (TYLENOL) 325 MG tablet Take 650 mg by mouth once as needed for mild pain or headache.    Marland Kitchen amLODipine (NORVASC) 5 MG tablet TAKE 1 TABLET BY MOUTH AT  BEDTIME (Patient taking differently: Take 5 mg by mouth at bedtime. ) 90 tablet 3  . Ascorbic Acid (VITAMIN C) 1000 MG tablet Take 1,000 mg by mouth daily.    Marland Kitchen aspirin EC 81 MG tablet Take 81 mg by mouth every morning.    Marland Kitchen atorvastatin (LIPITOR) 20 MG tablet TAKE 1 TABLET BY MOUTH  DAILY (Patient taking differently: Take 20 mg by mouth at bedtime. ) 90 tablet 3  . Calcium Carb-Cholecalciferol (CALCIUM-VITAMIN D3) 600-400 MG-UNIT TABS Take 1 tablet by mouth 2 (two) times daily.    . calcium carbonate (TUMS - DOSED IN MG ELEMENTAL CALCIUM) 500 MG chewable tablet Chew 2 tablets by mouth daily as needed for indigestion or heartburn.    . carvedilol (COREG) 12.5 MG tablet TAKE 1 TABLET BY MOUTH  TWICE A DAY WITH A MEAL ,  CAN TAKE 1/2 TABLET AS  NEEDED FOR PALPITATIONS (Patient taking differently: Take 12.5 mg by mouth 2 (two) times daily with a meal. ) 180 tablet 2  . Cholecalciferol (DIALYVITE VITAMIN D 5000) 125 MCG (5000 UT) capsule Take 5,000 Units by mouth daily.    Marland Kitchen ezetimibe (ZETIA) 10 MG tablet Take 1 tablet (10 mg total) by mouth at bedtime. 30 tablet 3  . ibuprofen (ADVIL) 200 MG tablet Take 400 mg by mouth daily as needed for headache or moderate pain.    . Multiple Vitamin (MULTIVITAMIN WITH MINERALS) TABS tablet Take 1 tablet by mouth daily.    Marland Kitchen omeprazole (PRILOSEC) 40 MG  capsule Take 40 mg by mouth daily.    . Simethicone (PHAZYME) 180 MG CAPS Take 180 mg by mouth 2 (two) times daily.    . tamoxifen (NOLVADEX) 20 MG tablet Take 0.5 tablets (10 mg total) by mouth daily. (Patient taking differently: Take 5 mg by mouth daily. ) 45 tablet 3  . TURMERIC PO Take 1 tablet by mouth 2 (two) times daily.     No current facility-administered medications for this visit.     PHYSICAL EXAMINATION: ECOG PERFORMANCE STATUS: 1 -  Symptomatic but completely ambulatory  There were no vitals filed for this visit. There were no vitals filed for this visit.  GENERAL: alert, no distress and comfortable SKIN: skin color, texture, turgor are normal, no rashes or significant lesions EYES: normal, Conjunctiva are pink and non-injected, sclera clear OROPHARYNX: no exudate, no erythema and lips, buccal mucosa, and tongue normal  NECK: supple, thyroid normal size, non-tender, without nodularity LYMPH: no palpable lymphadenopathy in the cervical, axillary or inguinal LUNGS: clear to auscultation and percussion with normal breathing effort HEART: regular rate & rhythm and no murmurs and no lower extremity edema ABDOMEN: abdomen soft, non-tender and normal bowel sounds MUSCULOSKELETAL: no cyanosis of digits and no clubbing  NEURO: alert & oriented x 3 with fluent speech, no focal motor/sensory deficits EXTREMITIES: No lower extremity edema  LABORATORY DATA:  I have reviewed the data as listed CMP Latest Ref Rng & Units 11/28/2018 07/15/2018 05/21/2018  Glucose 70 - 99 mg/dL 140(H) 101(H) 124(H)  BUN 8 - 23 mg/dL 14 15 15   Creatinine 0.44 - 1.00 mg/dL 0.82 0.74 0.78  Sodium 135 - 145 mmol/L 142 143 144  Potassium 3.5 - 5.1 mmol/L 4.0 4.8 4.4  Chloride 98 - 111 mmol/L 106 103 106  CO2 22 - 32 mmol/L 27 25 27   Calcium 8.9 - 10.3 mg/dL 8.8(L) 9.2 9.5  Total Protein 6.5 - 8.1 g/dL 6.1(L) 6.0 6.4(L)  Total Bilirubin 0.3 - 1.2 mg/dL 0.4 0.4 0.4  Alkaline Phos 38 - 126 U/L 39 35(L) 37(L)  AST 15 - 41 U/L 21 19 20   ALT 0 - 44 U/L 21 18 20     Lab Results  Component Value Date   WBC 39.9 (H) 05/29/2019   HGB 12.2 05/29/2019   HCT 38.3 05/29/2019   MCV 99.0 05/29/2019   PLT 144 (L) 05/29/2019   NEUTROABS PENDING 05/29/2019    ASSESSMENT & PLAN:  Chronic lymphocytic leukemia (CLL), B-cell (HCC) CLL CD5 and CD23 positive Stage I (small cervical lymph nodes)  Lab review:05/29/2019 WBC 39.9, platelets 144, hemoglobin 12.2  Patient has bilateral cervical lymphadenopathy in bilateral axilla lymphadenopathy as well.  No hepatosplenomegaly. She does not have any B symptoms. Prognosis of CLL: FISH panel revealed + 12 and -13 Q Which are indicative of favorable prognosis. No indications for treatment. Return to clinic in 6 months with labs and follow-up.  Neoplasm of left breast, primary tumor staging category Tis: lobular carcinoma in situ (LCIS) Left breast LCIS status post lumpectomy 10/06/2014; Started tamoxifen for breast cancer risk reduction 20 mg daily 11/18/2014 ,decrease to 10 mg on 2/14/2019plan is to treat for 5 years which will be completed in February 2021  Tamoxifen toxicities:  1. Hot flashes 6-8 per day :Patient is able to handle the 2.Patient complains of diffuse myalgias and stiffness: In spite of reducing the dosage of tamoxifen the symptoms are continuing. Could be related to Lipitor.  Breast cancer surveillance: Mammograms at [facility] revealed bilateral axillary lymphadenopathy.  I discussed with Dr. Georgiann Cocker.  This is related to CLL and does not need a biopsy. Return to clinic in 6 months for follow-up with labs  No orders of the defined types were placed in this encounter.  The patient has a good understanding of the overall plan. she agrees with it. she will call with any problems that may develop before the next visit here.  Nicholas Lose, MD 05/29/2019  Colleen Shannon am acting as scribe for Dr. Nicholas Lose.  I have reviewed the above documentation for accuracy and completeness, and I agree with the above.

## 2019-05-28 NOTE — Progress Notes (Signed)
The following are in epic: Last office visit note Dr. Claiborne Billings 08/29/2018 EKG 08/29/2018 ECHO 11/14/2017

## 2019-05-28 NOTE — Patient Instructions (Addendum)
DUE TO COVID-19 ONLY ONE VISITOR IS ALLOWED TO COME WITH YOU AND STAY IN THE WAITING ROOM ONLY DURING PRE OP AND PROCEDURE. THE ONE VISITOR MAY VISIT WITH YOU IN YOUR PRIVATE ROOM DURING VISITOR HOURS ONLY!!   COVID SWAB TESTING MUST BE COMPLETED ON: Saturday, Aug. 22, 2020 at [place], [place] [facility] enter pre surgical testing line (Must self quarantine after testing. Follow instructions on handout.)             Your procedure is scheduled on: Wednesday, Aug. 26, 2020   Report to [facility] Main  Entrance    Report to admitting at 1:00 PM   Call this number if you have problems the morning of surgery [phone]   Do not eat food:After Midnight.   May have liquids until 12 Noon day of surgery   CLEAR LIQUID DIET  Foods Allowed                                                                     Foods Excluded  Water, Black Coffee and tea, regular and decaf                             liquids that you cannot  Plain Jell-O in any flavor  (No red)                                           see through such as: Fruit ices (not with fruit pulp)                                     milk, soups, orange juice  Iced Popsicles (No red)                                    All solid food Carbonated beverages, regular and diet                                    Apple juices Sports drinks like Gatorade (No red) Lightly seasoned clear broth or consume(fat free) Sugar, honey syrup  Sample Menu Breakfast                                Lunch                                     Supper Cranberry juice                    Beef broth                            Chicken broth Jell-O  Grape juice                           Apple juice Coffee or tea                        Jell-O                                      Popsicle                                                Coffee or tea                        Coffee or  tea   Complete one Ensure drink the morning of surgery at 12 Noon the day of surgery.   Brush your teeth the morning of surgery.   Do NOT smoke after Midnight   Take these medicines the morning of surgery with A SIP OF WATER: Carvedilol, Omeprazole, Tamoxifen                               You may not have any metal on your body including hair pins, jewelry, and body piercings             Do not wear make-up, lotions, powders, perfumes/cologne, or deodorant             Do not wear nail polish.  Do not shave  48 hours prior to surgery.             Do not bring valuables to the hospital. [facility].   Contacts, dentures or bridgework may not be worn into surgery.   Bring small overnight bag day of surgery.    Special Instructions: Bring a copy of your healthcare power of attorney and living will documents         the day of surgery if you haven't scanned them in before.              Please read over the following fact sheets you were given:  [facility] - Preparing for Surgery Before surgery, you can play an important role.  Because skin is not sterile, your skin needs to be as free of germs as possible.  You can reduce the number of germs on your skin by washing with CHG (chlorahexidine gluconate) soap before surgery.  CHG is an antiseptic cleaner which kills germs and bonds with the skin to continue killing germs even after washing. Please DO NOT use if you have an allergy to CHG or antibacterial soaps.  If your skin becomes reddened/irritated stop using the CHG and inform your nurse when you arrive at Short Stay. Do not shave (including legs and underarms) for at least 48 hours prior to the first CHG shower.  You may shave your face/neck.  Please follow these instructions carefully:  1.  Shower with CHG Soap the night before surgery and the  morning of surgery.  2.  If you choose to wash your  hair, wash your hair first as usual with your  normal  shampoo.  3.  After you shampoo, rinse your hair and body thoroughly to remove the shampoo.                             4.  Use CHG as you would any other liquid soap.  You can apply chg directly to the skin and wash.  Gently with a scrungie or clean washcloth.  5.  Apply the CHG Soap to your body ONLY FROM THE NECK DOWN.   Do   not use on face/ open                           Wound or open sores. Avoid contact with eyes, ears mouth and   genitals (private parts).                       Wash face,  Genitals (private parts) with your normal soap.             6.  Wash thoroughly, paying special attention to the area where your    surgery  will be performed.  7.  Thoroughly rinse your body with warm water from the neck down.  8.  DO NOT shower/wash with your normal soap after using and rinsing off the CHG Soap.                9.  Pat yourself dry with a clean towel.            10.  Wear clean pajamas.            11.  Place clean sheets on your bed the night of your first shower and do not  sleep with pets. Day of Surgery : Do not apply any lotions/deodorants the morning of surgery.  Please wear clean clothes to the hospital/surgery center.  FAILURE TO FOLLOW THESE INSTRUCTIONS MAY RESULT IN THE CANCELLATION OF YOUR SURGERY  PATIENT SIGNATURE_________________________________  NURSE SIGNATURE__________________________________  ________________________________________________________________________   Colleen Shannon  An incentive spirometer is a tool that can help keep your lungs clear and active. This tool measures how well you are filling your lungs with each breath. Taking long deep breaths may help reverse or decrease the chance of developing breathing (pulmonary) problems (especially infection) following:  A long period of time when you are unable to move or be active. BEFORE THE PROCEDURE   If the spirometer includes an indicator to show your best effort, your nurse or  respiratory therapist will set it to a desired goal.  If possible, sit up straight or lean slightly forward. Try not to slouch.  Hold the incentive spirometer in an upright position. INSTRUCTIONS FOR USE  1. Sit on the edge of your bed if possible, or sit up as far as you can in bed or on a chair. 2. Hold the incentive spirometer in an upright position. 3. Breathe out normally. 4. Place the mouthpiece in your mouth and seal your lips tightly around it. 5. Breathe in slowly and as deeply as possible, raising the piston or the ball toward the top of the column. 6. Hold your breath for 3-5 seconds or for as long as possible. Allow the piston or ball to fall to the bottom of the column. 7. Remove the mouthpiece from your mouth and breathe out normally. 8. Rest  for a few seconds and repeat Steps 1 through 7 at least 10 times every 1-2 hours when you are awake. Take your time and take a few normal breaths between deep breaths. 9. The spirometer may include an indicator to show your best effort. Use the indicator as a goal to work toward during each repetition. 10. After each set of 10 deep breaths, practice coughing to be sure your lungs are clear. If you have an incision (the cut made at the time of surgery), support your incision when coughing by placing a pillow or rolled up towels firmly against it. Once you are able to get out of bed, walk around indoors and cough well. You may stop using the incentive spirometer when instructed by your caregiver.  RISKS AND COMPLICATIONS  Take your time so you do not get dizzy or light-headed.  If you are in pain, you may need to take or ask for pain medication before doing incentive spirometry. It is harder to take a deep breath if you are having pain. AFTER USE  Rest and breathe slowly and easily.  It can be helpful to keep track of a log of your progress. Your caregiver can provide you with a simple table to help with this. If you are using the  spirometer at home, follow these instructions: [place] IF:   You are having difficultly using the spirometer.  You have trouble using the spirometer as often as instructed.  Your pain medication is not giving enough relief while using the spirometer.  You develop fever of 100.5 F (38.1 C) or higher. SEEK IMMEDIATE MEDICAL CARE IF:   You cough up bloody sputum that had not been present before.  You develop fever of 102 F (38.9 C) or greater.  You develop worsening pain at or near the incision site. MAKE SURE YOU:   Understand these instructions.  Will watch your condition.  Will get help right away if you are not doing well or get worse. Document Released: 02/05/2007 Document Revised: 12/18/2011 Document Reviewed: 04/08/2007 [facility] Patient Information 2014 [place], [place].   ________________________________________________________________________

## 2019-05-29 ENCOUNTER — Encounter (HOSPITAL_COMMUNITY): Payer: Self-pay

## 2019-05-29 ENCOUNTER — Other Ambulatory Visit: Payer: Self-pay

## 2019-05-29 ENCOUNTER — Inpatient Hospital Stay: Payer: 59

## 2019-05-29 ENCOUNTER — Encounter (HOSPITAL_COMMUNITY)
Admission: RE | Admit: 2019-05-29 | Discharge: 2019-05-29 | Disposition: A | Discharge status: Home / Self Care | Payer: 59 | Source: Ambulatory Visit | Attending: Orthopedic Surgery | Admitting: Orthopedic Surgery

## 2019-05-29 ENCOUNTER — Inpatient Hospital Stay: Payer: 59 | Attending: Hematology and Oncology | Admitting: Hematology and Oncology

## 2019-05-29 VITALS — BP 130/71 | HR 77 | Temp 98.3°F | Resp 18 | Wt 151.5 lb

## 2019-05-29 DIAGNOSIS — D0502 Lobular carcinoma in situ of left breast: Secondary | ICD-10-CM | POA: Insufficient documentation

## 2019-05-29 DIAGNOSIS — Z79810 Long term (current) use of selective estrogen receptor modulators (SERMs): Secondary | ICD-10-CM | POA: Diagnosis not present

## 2019-05-29 DIAGNOSIS — Z01812 Encounter for preprocedural laboratory examination: Secondary | ICD-10-CM | POA: Insufficient documentation

## 2019-05-29 DIAGNOSIS — C9110 Chronic lymphocytic leukemia of B-cell type not having achieved remission: Secondary | ICD-10-CM | POA: Insufficient documentation

## 2019-05-29 DIAGNOSIS — Z20828 Contact with and (suspected) exposure to other viral communicable diseases: Secondary | ICD-10-CM | POA: Insufficient documentation

## 2019-05-29 DIAGNOSIS — M24661 Ankylosis, right knee: Secondary | ICD-10-CM | POA: Diagnosis not present

## 2019-05-29 HISTORY — DX: Nausea with vomiting, unspecified: R11.2

## 2019-05-29 HISTORY — DX: Other ill-defined heart diseases: I51.89

## 2019-05-29 HISTORY — DX: Plantar fascial fibromatosis: M72.2

## 2019-05-29 HISTORY — DX: Nonrheumatic mitral (valve) insufficiency: I34.0

## 2019-05-29 HISTORY — DX: Gastro-esophageal reflux disease without esophagitis: K21.9

## 2019-05-29 HISTORY — DX: Other specified postprocedural states: Z98.890

## 2019-05-29 HISTORY — DX: Pneumonia, unspecified organism: J18.9

## 2019-05-29 HISTORY — DX: Chronic lymphocytic leukemia of B-cell type not having achieved remission: C91.10

## 2019-05-29 LAB — CBC WITH DIFFERENTIAL (CANCER CENTER ONLY)
Abs Immature Granulocytes: 0.06 10*3/uL (ref 0.00–0.07)
Basophils Absolute: 0 10*3/uL (ref 0.0–0.1)
Basophils Relative: 0 %
Eosinophils Absolute: 0.2 10*3/uL (ref 0.0–0.5)
Eosinophils Relative: 1 %
HCT: 38.3 % (ref 36.0–46.0)
Hemoglobin: 12.2 g/dL (ref 12.0–15.0)
Immature Granulocytes: 0 %
Lymphocytes Relative: 89 %
Lymphs Abs: 35.5 10*3/uL — ABNORMAL HIGH (ref 0.7–4.0)
MCH: 31.5 pg (ref 26.0–34.0)
MCHC: 31.9 g/dL (ref 30.0–36.0)
MCV: 99 fL (ref 80.0–100.0)
Monocytes Absolute: 1.5 10*3/uL — ABNORMAL HIGH (ref 0.1–1.0)
Monocytes Relative: 4 %
Neutro Abs: 2.6 10*3/uL (ref 1.7–7.7)
Neutrophils Relative %: 6 %
Platelet Count: 144 10*3/uL — ABNORMAL LOW (ref 150–400)
RBC: 3.87 MIL/uL (ref 3.87–5.11)
RDW: 13.2 % (ref 11.5–15.5)
WBC Count: 39.9 10*3/uL — ABNORMAL HIGH (ref 4.0–10.5)
nRBC: 0.1 % (ref 0.0–0.2)

## 2019-05-29 LAB — CMP (CANCER CENTER ONLY)
ALT: 23 U/L (ref 0–44)
AST: 26 U/L (ref 15–41)
Albumin: 3.9 g/dL (ref 3.5–5.0)
Alkaline Phosphatase: 34 U/L — ABNORMAL LOW (ref 38–126)
Anion gap: 10 (ref 5–15)
BUN: 16 mg/dL (ref 8–23)
CO2: 25 mmol/L (ref 22–32)
Calcium: 8.8 mg/dL — ABNORMAL LOW (ref 8.9–10.3)
Chloride: 106 mmol/L (ref 98–111)
Creatinine: 0.8 mg/dL (ref 0.44–1.00)
GFR, Est AFR Am: >60 mL/min (ref >60)
GFR, Estimated: >60 mL/min (ref >60)
Glucose, Bld: 120 mg/dL — ABNORMAL HIGH (ref 70–99)
Potassium: 4.3 mmol/L (ref 3.5–5.1)
Sodium: 141 mmol/L (ref 135–145)
Total Bilirubin: 0.3 mg/dL (ref 0.3–1.2)
Total Protein: 6.3 g/dL — ABNORMAL LOW (ref 6.5–8.1)

## 2019-05-29 LAB — LACTATE DEHYDROGENASE: LDH: 311 U/L — ABNORMAL HIGH (ref 98–192)

## 2019-05-29 MED ORDER — ENSURE PRE-SURGERY PO LIQD
296.0000 mL | Freq: Once | ORAL | Status: DC
  Filled 2019-05-29: qty 296

## 2019-05-29 NOTE — Progress Notes (Signed)
SPOKE W/  [place]     SCREENING SYMPTOMS OF COVID 19:   COUGH--NO  RUNNY NOSE--- NO  SORE THROAT---NO  NASAL CONGESTION----NO  SNEEZING----NO  SHORTNESS OF BREATH---NO  DIFFICULTY BREATHING---NO  TEMP >100.0 -----NO  UNEXPLAINED BODY ACHES------NO  CHILLS -------- NO  HEADACHES ---------NO  LOSS OF SMELL/ TASTE -------NO-    HAVE YOU OR ANY FAMILY MEMBER TRAVELLED PAST 14 DAYS OUT OF THE   COUNTY---Traveled to [facility] STATE----NO COUNTRY----NO  HAVE YOU OR ANY FAMILY MEMBER BEEN EXPOSED TO ANYONE WITH COVID 19? NO

## 2019-05-29 NOTE — Progress Notes (Signed)
CBC/diff and CMP drawn 05/29/2019 at cancer center.

## 2019-05-30 ENCOUNTER — Telehealth: Payer: Self-pay | Admitting: Hematology and Oncology

## 2019-05-30 NOTE — Telephone Encounter (Signed)
I left a message regarding schedule  

## 2019-05-31 ENCOUNTER — Other Ambulatory Visit (HOSPITAL_COMMUNITY)
Admission: RE | Admit: 2019-05-31 | Discharge: 2019-05-31 | Disposition: A | Discharge status: Home / Self Care | Payer: 59 | Source: Ambulatory Visit | Attending: Orthopedic Surgery | Admitting: Orthopedic Surgery

## 2019-05-31 DIAGNOSIS — Z01812 Encounter for preprocedural laboratory examination: Secondary | ICD-10-CM | POA: Diagnosis not present

## 2019-06-01 LAB — SARS CORONAVIRUS 2 (TAT 6-24 HRS): SARS Coronavirus 2: NEGATIVE

## 2019-06-03 NOTE — Progress Notes (Signed)
Anesthesia Chart Review   Case: [id number] Date/Time: 06/04/19 1446   Procedure: Right knee arthrotomy; scar excision (Right ) - 51min   Anesthesia type: Choice   Pre-op diagnosis: arthrofibrosis right knee   Location: [facility] ROOM 09 / [facility]   Surgeon: [name], MD      DISCUSSION:62 y.o. never smoker with h/o PONV, HTN, GERD, CLL, breast cancer (currently on tamoxifen therapy), arthrofibrosis right knee scheduled for above procedure 06/04/2019 with Dr. [name].   Pt last seen by cardiologist, Dr. Shelva Majestic, 08/29/18.  In August 2013 a nuclear perfusion study was normal. Stable at this visit with 1 year follow up recommended.    Anticipate pt can proceed with planned procedure barring acute status change.   VS: BP (!) 116/58   Pulse 71   Temp 37 C (Oral)   Resp 16   Ht 5\' 1"  (1.549 m)   Wt 68 kg   LMP 11/19/2011   SpO2 97%   BMI 28.34 kg/m   PROVIDERS: Carol Ada, MD is PCP   Nicholas Lose, MD is Medical Oncologist last OV 05/29/2019, stable at this visit  Shelva Majestic, MD is Cardiologist  LABS: Labs reviewed: Acceptable for surgery. (all labs ordered are listed, but only abnormal results are displayed)  Labs Reviewed - No data to display   IMAGES:   EKG: 08/29/18 Rate 69 bpm Normal sinus rhythm  Normal ECG   CV: Echo 11/14/17 Study Conclusions  - Left ventricle: The cavity size was normal. Wall thickness was   normal. Systolic function was normal. The estimated ejection   fraction was in the range of 55% to 60%. Wall motion was normal;   there were no regional wall motion abnormalities. Doppler   parameters are consistent with abnormal left ventricular   relaxation (grade 1 diastolic dysfunction). - Aortic valve: There was no stenosis. - Mitral valve: There was mild regurgitation. - Right ventricle: The cavity size was normal. Systolic function   was normal. - Pulmonary arteries: PA peak pressure: 23 mm Hg (S). - Inferior vena cava: The  vessel was normal in size. The   respirophasic diameter changes were in the normal range (= 50%),   consistent with normal central venous pressure.  Impressions:  - Normal LV size with EF 55-60%. Normal RV size and systolic   function. Mild MR. Past Medical History:  Diagnosis Date  . Arthritis   . Cancer (HCC)    hx of skin cancer   . CLL (chronic lymphocytic leukemia) ([place])   . Elevated cholesterol 05/22/12   Nuc stress test-Normal. The patiet had a markedly positive GXT for ischemia. post stress EF 72%  . Endometrial polyp   . GERD (gastroesophageal reflux disease)   . Grade I diastolic dysfunction    noted on echo  . Hypertension 05/22/12   ECHO-EF >55% trace tricupsid regurgitation. There is mild mitral regurgitation.   . MR (mitral regurgitation)    mild noted on echo  . Plantar fasciitis of left foot   . Pneumonia    history of  . PONV (postoperative nausea and vomiting)     Past Surgical History:  Procedure Laterality Date  . ABDOMINAL SURGERY     Abdominoplasty  . CESAREAN SECTION     X 2  . CHOLECYSTECTOMY    . COLONSCOPY    . DILATION AND CURETTAGE OF UTERUS    . FOOT SURGERY    . HYSTEROSCOPY    . KNEE CLOSED REDUCTION Right 07/20/2014  Procedure: RIGHT KNEE CLOSED MANIPULATION;  Surgeon: [name], MD;  Location: [facility];  Service: Orthopedics;  Laterality: Right;  . KNEE SURGERY     arthroscopic  . PARTIAL KNEE ARTHROPLASTY Left   . RADIOACTIVE SEED GUIDED EXCISIONAL BREAST BIOPSY N/A 10/06/2014   Procedure: RADIOACTIVE SEED GUIDED EXCISIONAL BREAST BIOPSY;  Surgeon: Rolm Bookbinder, MD;  Location: [place];  Service: General;  Laterality: N/A;  . REFRACTIVE SURGERY    . TOTAL KNEE ARTHROPLASTY Right 05/11/2014   Procedure: RIGHT TOTAL KNEE ARTHROPLASTY;  Surgeon: [name], MD;  Location: [facility];  Service: Orthopedics;  Laterality: Right;  . TUBAL LIGATION      MEDICATIONS: . acetaminophen (TYLENOL) 325 MG tablet  .  amLODipine (NORVASC) 5 MG tablet  . Ascorbic Acid (VITAMIN C) 1000 MG tablet  . aspirin EC 81 MG tablet  . atorvastatin (LIPITOR) 20 MG tablet  . Calcium Carb-Cholecalciferol (CALCIUM-VITAMIN D3) 600-400 MG-UNIT TABS  . calcium carbonate (TUMS - DOSED IN MG ELEMENTAL CALCIUM) 500 MG chewable tablet  . carvedilol (COREG) 12.5 MG tablet  . Cholecalciferol (DIALYVITE VITAMIN D 5000) 125 MCG (5000 UT) capsule  . ezetimibe (ZETIA) 10 MG tablet  . ibuprofen (ADVIL) 200 MG tablet  . Multiple Vitamin (MULTIVITAMIN WITH MINERALS) TABS tablet  . omeprazole (PRILOSEC) 40 MG capsule  . Simethicone (PHAZYME) 180 MG CAPS  . tamoxifen (NOLVADEX) 20 MG tablet  . TURMERIC PO   No current facility-administered medications for this encounter.    Maia Plan [facility] Pre-Surgical Testing [phone] 06/03/19  9:54 AM

## 2019-06-03 NOTE — Anesthesia Preprocedure Evaluation (Addendum)
Anesthesia Evaluation  Patient identified by MRN, date of birth, ID band Patient awake    Reviewed: Allergy & Precautions, NPO status , Patient's Chart, lab work & pertinent test results  History of Anesthesia Complications (+) PONV and history of anesthetic complications  Airway Mallampati: II  TM Distance: >3 FB Neck ROM: Full    Dental  (+) Dental Advisory Given, Teeth Intact   Pulmonary neg pulmonary ROS,    breath sounds clear to auscultation       Cardiovascular hypertension, Pt. on medications  Rhythm:Regular Rate:Normal   '19 TTE -  EF 55% to 60%. Grade 1 diastolic dysfunction. Mild MR.    Neuro/Psych negative neurological ROS  negative psych ROS   GI/Hepatic Neg liver ROS, GERD  Medicated and Controlled,  Endo/Other  negative endocrine ROS  Renal/GU negative Renal ROS     Musculoskeletal  (+) Arthritis ,   Abdominal   Peds  Hematology  CLL Leukocytosis Thrombocytopenia    Anesthesia Other Findings   Reproductive/Obstetrics  S/p tubal ligation                            Anesthesia Physical Anesthesia Plan  ASA: III  Anesthesia Plan: General   Post-op Pain Management:    Induction: Intravenous  PONV Risk Score and Plan: 4 or greater and Treatment may vary due to age or medical condition, Ondansetron, Dexamethasone and Midazolam  Airway Management Planned: LMA  Additional Equipment: None  Intra-op Plan:   Post-operative Plan: Extubation in OR  Informed Consent: I have reviewed the patients History and Physical, chart, labs and discussed the procedure including the risks, benefits and alternatives for the proposed anesthesia with the patient or authorized representative who has indicated his/her understanding and acceptance.     Dental advisory given  Plan Discussed with: CRNA and Anesthesiologist  Anesthesia Plan Comments:       Anesthesia Quick  Evaluation

## 2019-06-04 ENCOUNTER — Encounter (HOSPITAL_COMMUNITY): Payer: Self-pay | Admitting: Certified Registered"

## 2019-06-04 ENCOUNTER — Other Ambulatory Visit: Payer: Self-pay

## 2019-06-04 ENCOUNTER — Ambulatory Visit (HOSPITAL_COMMUNITY): Payer: 59 | Admitting: Anesthesiology

## 2019-06-04 ENCOUNTER — Ambulatory Visit (HOSPITAL_COMMUNITY): Payer: 59 | Admitting: Physician Assistant

## 2019-06-04 ENCOUNTER — Encounter (HOSPITAL_COMMUNITY)
Admission: RE | Disposition: A | Discharge status: Home / Self Care | Payer: Self-pay | Source: Home / Self Care | Attending: Orthopedic Surgery

## 2019-06-04 ENCOUNTER — Telehealth (HOSPITAL_COMMUNITY): Payer: Self-pay | Admitting: *Deleted

## 2019-06-04 ENCOUNTER — Ambulatory Visit (HOSPITAL_COMMUNITY)
Admission: RE | Admit: 2019-06-04 | Discharge: 2019-06-05 | Disposition: A | Discharge status: Home / Self Care | Payer: 59 | Attending: Orthopedic Surgery | Admitting: Orthopedic Surgery

## 2019-06-04 DIAGNOSIS — C9110 Chronic lymphocytic leukemia of B-cell type not having achieved remission: Secondary | ICD-10-CM | POA: Diagnosis not present

## 2019-06-04 DIAGNOSIS — M24661 Ankylosis, right knee: Secondary | ICD-10-CM | POA: Diagnosis present

## 2019-06-04 DIAGNOSIS — Z96651 Presence of right artificial knee joint: Secondary | ICD-10-CM | POA: Insufficient documentation

## 2019-06-04 DIAGNOSIS — T8482XA Fibrosis due to internal orthopedic prosthetic devices, implants and grafts, initial encounter: Secondary | ICD-10-CM | POA: Diagnosis present

## 2019-06-04 DIAGNOSIS — K219 Gastro-esophageal reflux disease without esophagitis: Secondary | ICD-10-CM | POA: Insufficient documentation

## 2019-06-04 DIAGNOSIS — M1990 Unspecified osteoarthritis, unspecified site: Secondary | ICD-10-CM | POA: Diagnosis not present

## 2019-06-04 DIAGNOSIS — Z79899 Other long term (current) drug therapy: Secondary | ICD-10-CM | POA: Insufficient documentation

## 2019-06-04 DIAGNOSIS — Z7982 Long term (current) use of aspirin: Secondary | ICD-10-CM | POA: Diagnosis not present

## 2019-06-04 DIAGNOSIS — I10 Essential (primary) hypertension: Secondary | ICD-10-CM | POA: Insufficient documentation

## 2019-06-04 HISTORY — PX: KNEE ARTHROTOMY: SHX5881

## 2019-06-04 SURGERY — ARTHROTOMY, KNEE
Anesthesia: General | Site: Knee | Laterality: Right

## 2019-06-04 MED ORDER — PHENYLEPHRINE HCL (PRESSORS) 10 MG/ML IV SOLN
INTRAVENOUS | Status: AC
  Filled 2019-06-04: qty 1

## 2019-06-04 MED ORDER — FENTANYL CITRATE (PF) 100 MCG/2ML IJ SOLN
50.0000 ug | INTRAMUSCULAR | Status: DC
  Filled 2019-06-04: qty 2

## 2019-06-04 MED ORDER — PANTOPRAZOLE SODIUM 40 MG PO TBEC
80.0000 mg | DELAYED_RELEASE_TABLET | Freq: Every day | ORAL | Status: DC
  Administered 2019-06-05: 80 mg via ORAL
  Filled 2019-06-04: qty 2

## 2019-06-04 MED ORDER — PROPOFOL 10 MG/ML IV BOLUS
INTRAVENOUS | Status: DC | PRN
  Administered 2019-06-04: 150 mg via INTRAVENOUS

## 2019-06-04 MED ORDER — FENTANYL CITRATE (PF) 100 MCG/2ML IJ SOLN
25.0000 ug | INTRAMUSCULAR | Status: DC | PRN
  Administered 2019-06-04 (×3): 50 ug via INTRAVENOUS

## 2019-06-04 MED ORDER — DEXAMETHASONE SODIUM PHOSPHATE 10 MG/ML IJ SOLN: 8.0000 mg | Freq: Once | INTRAMUSCULAR | Status: DC

## 2019-06-04 MED ORDER — POVIDONE-IODINE 10 % EX SWAB
2.0000 "application " | Freq: Once | CUTANEOUS | Status: AC
  Administered 2019-06-04: 2 via TOPICAL

## 2019-06-04 MED ORDER — MIDAZOLAM HCL 2 MG/2ML IJ SOLN
1.0000 mg | INTRAMUSCULAR | Status: DC
  Filled 2019-06-04: qty 2

## 2019-06-04 MED ORDER — ONDANSETRON HCL 4 MG/2ML IJ SOLN
INTRAMUSCULAR | Status: DC | PRN
  Administered 2019-06-04: 4 mg via INTRAVENOUS

## 2019-06-04 MED ORDER — AMLODIPINE BESYLATE 5 MG PO TABS
5.0000 mg | ORAL_TABLET | Freq: Every day | ORAL | Status: DC
  Administered 2019-06-04: 5 mg via ORAL
  Filled 2019-06-04: qty 1

## 2019-06-04 MED ORDER — METHOCARBAMOL 500 MG IVPB - SIMPLE MED
500.0000 mg | Freq: Four times a day (QID) | INTRAVENOUS | Status: DC | PRN
  Administered 2019-06-04: 500 mg via INTRAVENOUS
  Filled 2019-06-04: qty 50

## 2019-06-04 MED ORDER — TAMOXIFEN CITRATE 10 MG PO TABS
5.0000 mg | ORAL_TABLET | Freq: Every day | ORAL | Status: DC
  Filled 2019-06-04: qty 1

## 2019-06-04 MED ORDER — DOCUSATE SODIUM 100 MG PO CAPS
100.0000 mg | ORAL_CAPSULE | Freq: Two times a day (BID) | ORAL | Status: DC
  Administered 2019-06-04 – 2019-06-05 (×2): 100 mg via ORAL
  Filled 2019-06-04 (×2): qty 1

## 2019-06-04 MED ORDER — FENTANYL CITRATE (PF) 100 MCG/2ML IJ SOLN
INTRAMUSCULAR | Status: AC
  Filled 2019-06-04: qty 4

## 2019-06-04 MED ORDER — DEXAMETHASONE SODIUM PHOSPHATE 10 MG/ML IJ SOLN
INTRAMUSCULAR | Status: AC
  Filled 2019-06-04: qty 3

## 2019-06-04 MED ORDER — ALBUMIN HUMAN 5 % IV SOLN
INTRAVENOUS | Status: AC
  Filled 2019-06-04: qty 750

## 2019-06-04 MED ORDER — HYDROCODONE-ACETAMINOPHEN 5-325 MG PO TABS
1.0000 | ORAL_TABLET | ORAL | Status: DC | PRN
  Administered 2019-06-04: 2 via ORAL
  Filled 2019-06-04: qty 2

## 2019-06-04 MED ORDER — METOCLOPRAMIDE HCL 5 MG PO TABS: 5.0000 mg | ORAL_TABLET | Freq: Three times a day (TID) | ORAL | Status: DC | PRN

## 2019-06-04 MED ORDER — LIDOCAINE 2% (20 MG/ML) 5 ML SYRINGE
INTRAMUSCULAR | Status: AC
  Filled 2019-06-04: qty 15

## 2019-06-04 MED ORDER — CHLORHEXIDINE GLUCONATE 4 % EX LIQD
60.0000 mL | Freq: Once | CUTANEOUS | Status: AC
  Administered 2019-06-04: 4 via TOPICAL

## 2019-06-04 MED ORDER — OXYCODONE HCL 5 MG PO TABS: 5.0000 mg | ORAL_TABLET | Freq: Once | ORAL | Status: DC | PRN

## 2019-06-04 MED ORDER — EPHEDRINE SULFATE-NACL 50-0.9 MG/10ML-% IV SOSY
PREFILLED_SYRINGE | INTRAVENOUS | Status: DC | PRN
  Administered 2019-06-04: 10 mg via INTRAVENOUS

## 2019-06-04 MED ORDER — PROMETHAZINE HCL 25 MG/ML IJ SOLN
6.2500 mg | INTRAMUSCULAR | Status: DC | PRN
  Administered 2019-06-04: 15:00:00 12.5 mg via INTRAVENOUS

## 2019-06-04 MED ORDER — SUGAMMADEX SODIUM 500 MG/5ML IV SOLN
INTRAVENOUS | Status: AC
  Filled 2019-06-04: qty 5

## 2019-06-04 MED ORDER — OXYCODONE HCL 5 MG/5ML PO SOLN: 5.0000 mg | Freq: Once | ORAL | Status: DC | PRN

## 2019-06-04 MED ORDER — FENTANYL CITRATE (PF) 250 MCG/5ML IJ SOLN
INTRAMUSCULAR | Status: AC
  Filled 2019-06-04: qty 5

## 2019-06-04 MED ORDER — BUPIVACAINE-EPINEPHRINE (PF) 0.25% -1:200000 IJ SOLN
INTRAMUSCULAR | Status: AC
  Filled 2019-06-04: qty 30

## 2019-06-04 MED ORDER — ASPIRIN EC 325 MG PO TBEC
325.0000 mg | DELAYED_RELEASE_TABLET | Freq: Every day | ORAL | Status: DC
  Administered 2019-06-05: 325 mg via ORAL
  Filled 2019-06-04: qty 1

## 2019-06-04 MED ORDER — ACETAMINOPHEN 10 MG/ML IV SOLN
1000.0000 mg | Freq: Four times a day (QID) | INTRAVENOUS | Status: DC
  Administered 2019-06-04: 1000 mg via INTRAVENOUS
  Filled 2019-06-04: qty 100

## 2019-06-04 MED ORDER — HYDROMORPHONE HCL 1 MG/ML IJ SOLN
INTRAMUSCULAR | Status: AC
  Filled 2019-06-04: qty 2

## 2019-06-04 MED ORDER — METHOCARBAMOL 500 MG IVPB - SIMPLE MED
INTRAVENOUS | Status: AC
  Filled 2019-06-04: qty 50

## 2019-06-04 MED ORDER — ATORVASTATIN CALCIUM 20 MG PO TABS
20.0000 mg | ORAL_TABLET | Freq: Every day | ORAL | Status: DC
  Administered 2019-06-04: 20 mg via ORAL
  Filled 2019-06-04: qty 1

## 2019-06-04 MED ORDER — FENTANYL CITRATE (PF) 100 MCG/2ML IJ SOLN
INTRAMUSCULAR | Status: AC
  Filled 2019-06-04: qty 2

## 2019-06-04 MED ORDER — PHENYLEPHRINE HCL (PRESSORS) 10 MG/ML IV SOLN
INTRAVENOUS | Status: AC
  Filled 2019-06-04: qty 2

## 2019-06-04 MED ORDER — ONDANSETRON HCL 4 MG PO TABS: 4.0000 mg | ORAL_TABLET | Freq: Four times a day (QID) | ORAL | Status: DC | PRN

## 2019-06-04 MED ORDER — DEXAMETHASONE SODIUM PHOSPHATE 10 MG/ML IJ SOLN
INTRAMUSCULAR | Status: DC | PRN
  Administered 2019-06-04: 8 mg via INTRAVENOUS

## 2019-06-04 MED ORDER — 0.9 % SODIUM CHLORIDE (POUR BTL) OPTIME
TOPICAL | Status: DC | PRN
  Administered 2019-06-04: 14:00:00 1000 mL

## 2019-06-04 MED ORDER — LIDOCAINE 2% (20 MG/ML) 5 ML SYRINGE
INTRAMUSCULAR | Status: DC | PRN
  Administered 2019-06-04: 100 mg via INTRAVENOUS

## 2019-06-04 MED ORDER — LACTATED RINGERS IV SOLN
INTRAVENOUS | Status: DC
  Administered 2019-06-04 (×2): via INTRAVENOUS

## 2019-06-04 MED ORDER — MIDAZOLAM HCL 2 MG/2ML IJ SOLN
INTRAMUSCULAR | Status: AC
  Filled 2019-06-04: qty 2

## 2019-06-04 MED ORDER — MIDAZOLAM HCL 2 MG/2ML IJ SOLN
INTRAMUSCULAR | Status: DC | PRN
  Administered 2019-06-04 (×2): 1 mg via INTRAVENOUS

## 2019-06-04 MED ORDER — ONDANSETRON HCL 4 MG/2ML IJ SOLN: 4.0000 mg | Freq: Four times a day (QID) | INTRAMUSCULAR | Status: DC | PRN

## 2019-06-04 MED ORDER — SODIUM CHLORIDE (PF) 0.9 % IJ SOLN
INTRAMUSCULAR | Status: AC
  Filled 2019-06-04: qty 10

## 2019-06-04 MED ORDER — SODIUM CHLORIDE 0.9 % IV SOLN: INTRAVENOUS | Status: DC

## 2019-06-04 MED ORDER — PROPOFOL 10 MG/ML IV BOLUS
INTRAVENOUS | Status: AC
  Filled 2019-06-04: qty 20

## 2019-06-04 MED ORDER — EZETIMIBE 10 MG PO TABS
10.0000 mg | ORAL_TABLET | Freq: Every day | ORAL | Status: DC
  Administered 2019-06-04: 10 mg via ORAL
  Filled 2019-06-04: qty 1

## 2019-06-04 MED ORDER — METHOCARBAMOL 500 MG PO TABS: 500.0000 mg | ORAL_TABLET | Freq: Four times a day (QID) | ORAL | Status: DC | PRN

## 2019-06-04 MED ORDER — HYDROMORPHONE HCL 1 MG/ML IJ SOLN
0.2500 mg | INTRAMUSCULAR | Status: DC | PRN
  Administered 2019-06-04 (×2): 0.5 mg via INTRAVENOUS

## 2019-06-04 MED ORDER — PROMETHAZINE HCL 25 MG/ML IJ SOLN
INTRAMUSCULAR | Status: AC
  Filled 2019-06-04: qty 1

## 2019-06-04 MED ORDER — CARVEDILOL 12.5 MG PO TABS
12.5000 mg | ORAL_TABLET | Freq: Two times a day (BID) | ORAL | Status: DC
  Administered 2019-06-04 – 2019-06-05 (×2): 12.5 mg via ORAL
  Filled 2019-06-04: qty 1

## 2019-06-04 MED ORDER — ONDANSETRON HCL 4 MG/2ML IJ SOLN
INTRAMUSCULAR | Status: AC
  Filled 2019-06-04: qty 6

## 2019-06-04 MED ORDER — FENTANYL CITRATE (PF) 100 MCG/2ML IJ SOLN
INTRAMUSCULAR | Status: DC | PRN
  Administered 2019-06-04 (×2): 50 ug via INTRAVENOUS

## 2019-06-04 MED ORDER — CEFAZOLIN SODIUM-DEXTROSE 2-4 GM/100ML-% IV SOLN
2.0000 g | INTRAVENOUS | Status: AC
  Administered 2019-06-04: 2 g via INTRAVENOUS
  Filled 2019-06-04: qty 100

## 2019-06-04 MED ORDER — METOCLOPRAMIDE HCL 5 MG/ML IJ SOLN: 5.0000 mg | Freq: Three times a day (TID) | INTRAMUSCULAR | Status: DC | PRN

## 2019-06-04 MED ORDER — HYDROCODONE-ACETAMINOPHEN 7.5-325 MG PO TABS
1.0000 | ORAL_TABLET | ORAL | Status: DC | PRN
  Administered 2019-06-04 – 2019-06-05 (×3): 2 via ORAL
  Administered 2019-06-05: 1 via ORAL
  Filled 2019-06-04 (×3): qty 2
  Filled 2019-06-04: qty 1

## 2019-06-04 MED ORDER — ACETAMINOPHEN 500 MG PO TABS
500.0000 mg | ORAL_TABLET | Freq: Four times a day (QID) | ORAL | Status: DC
  Filled 2019-06-04: qty 1

## 2019-06-04 MED ORDER — MORPHINE SULFATE (PF) 2 MG/ML IV SOLN: 0.5000 mg | INTRAVENOUS | Status: DC | PRN

## 2019-06-04 MED ORDER — SUCCINYLCHOLINE CHLORIDE 200 MG/10ML IV SOSY
PREFILLED_SYRINGE | INTRAVENOUS | Status: AC
  Filled 2019-06-04: qty 10

## 2019-06-04 MED ORDER — FENTANYL CITRATE (PF) 250 MCG/5ML IJ SOLN
INTRAMUSCULAR | Status: DC | PRN
  Administered 2019-06-04: 50 ug via INTRAVENOUS
  Administered 2019-06-04: 100 ug via INTRAVENOUS
  Administered 2019-06-04 (×2): 50 ug via INTRAVENOUS

## 2019-06-04 MED ORDER — PHENYLEPHRINE 40 MCG/ML (10ML) SYRINGE FOR IV PUSH (FOR BLOOD PRESSURE SUPPORT)
PREFILLED_SYRINGE | INTRAVENOUS | Status: DC | PRN
  Administered 2019-06-04: 120 ug via INTRAVENOUS
  Administered 2019-06-04 (×3): 80 ug via INTRAVENOUS
  Administered 2019-06-04: 40 ug via INTRAVENOUS

## 2019-06-04 SURGICAL SUPPLY — 36 items
BAG SPEC THK2 15X12 ZIP CLS (MISCELLANEOUS) ×1
BAG ZIPLOCK 12X15 (MISCELLANEOUS) ×2 IMPLANT
BNDG CMPR MED 15X6 ELC VLCR LF (GAUZE/BANDAGES/DRESSINGS) ×1
BNDG ELASTIC 6X15 VLCR STRL LF (GAUZE/BANDAGES/DRESSINGS) ×1 IMPLANT
BNDG ELASTIC 6X5.8 VLCR STR LF (GAUZE/BANDAGES/DRESSINGS) ×2 IMPLANT
CLSR STERI-STRIP ANTIMIC 1/2X4 (GAUZE/BANDAGES/DRESSINGS) ×2 IMPLANT
COVER SURGICAL LIGHT HANDLE (MISCELLANEOUS) ×2 IMPLANT
COVER WAND RF STERILE (DRAPES) IMPLANT
CUFF TOURN SGL QUICK 34 (TOURNIQUET CUFF)
CUFF TRNQT CYL 34X4.125X (TOURNIQUET CUFF) IMPLANT
DRAPE U-SHAPE 47X51 STRL (DRAPES) ×2 IMPLANT
DRSG ADAPTIC 3X8 NADH LF (GAUZE/BANDAGES/DRESSINGS) ×2 IMPLANT
DRSG PAD ABDOMINAL 8X10 ST (GAUZE/BANDAGES/DRESSINGS) ×4 IMPLANT
DURAPREP 26ML APPLICATOR (WOUND CARE) ×2 IMPLANT
ELECT REM PT RETURN 15FT ADLT (MISCELLANEOUS) ×2 IMPLANT
EVACUATOR 1/8 PVC DRAIN (DRAIN) IMPLANT
GAUZE SPONGE 4X4 12PLY STRL (GAUZE/BANDAGES/DRESSINGS) ×2 IMPLANT
GLOVE BIO SURGEON STRL SZ7 (GLOVE) ×2 IMPLANT
GLOVE BIO SURGEON STRL SZ8 (GLOVE) ×2 IMPLANT
GLOVE BIOGEL PI IND STRL 7.0 (GLOVE) ×1 IMPLANT
GLOVE BIOGEL PI IND STRL 8 (GLOVE) ×3 IMPLANT
GLOVE BIOGEL PI INDICATOR 7.0 (GLOVE) ×1
GLOVE BIOGEL PI INDICATOR 8 (GLOVE) ×3
GOWN STRL REUS W/TWL LRG LVL3 (GOWN DISPOSABLE) ×6 IMPLANT
KIT TURNOVER KIT A (KITS) IMPLANT
MANIFOLD NEPTUNE II (INSTRUMENTS) ×2 IMPLANT
PACK TOTAL KNEE CUSTOM (KITS) ×2 IMPLANT
PADDING CAST COTTON 6X4 STRL (CAST SUPPLIES) ×4 IMPLANT
PROTECTOR NERVE ULNAR (MISCELLANEOUS) ×2 IMPLANT
STRIP CLOSURE SKIN 1/2X4 (GAUZE/BANDAGES/DRESSINGS) ×4 IMPLANT
SUT MNCRL AB 4-0 PS2 18 (SUTURE) ×2 IMPLANT
SUT STRATAFIX 0 PDS 27 VIOLET (SUTURE) ×2
SUT VIC AB 2-0 CT1 27 (SUTURE) ×4
SUT VIC AB 2-0 CT1 TAPERPNT 27 (SUTURE) ×2 IMPLANT
SUTURE STRATFX 0 PDS 27 VIOLET (SUTURE) ×1 IMPLANT
WRAP KNEE MAXI GEL POST OP (GAUZE/BANDAGES/DRESSINGS) ×1 IMPLANT

## 2019-06-04 NOTE — Plan of Care (Signed)
Continue current POC 

## 2019-06-04 NOTE — Anesthesia Postprocedure Evaluation (Signed)
Anesthesia Post Note  Patient: Colleen Shannon  Procedure(s) Performed: Right knee arthrotomy; scar excision (Right Knee)     Patient location during evaluation: PACU Anesthesia Type: General Level of consciousness: awake and alert Pain management: pain level controlled Vital Signs Assessment: post-procedure vital signs reviewed and stable Respiratory status: spontaneous breathing, nonlabored ventilation, respiratory function stable and patient connected to nasal cannula oxygen Cardiovascular status: blood pressure returned to baseline and stable Postop Assessment: no apparent nausea or vomiting Anesthetic complications: no    Last Vitals:  Vitals:   06/04/19 1615 06/04/19 1643  BP: (!) 124/59 118/68  Pulse: 81 75  Resp: 12 16  Temp: 37.2 C 37.2 C  SpO2: 93% 100%    Last Pain:  Vitals:   06/04/19 1615  TempSrc:   PainSc: [place]

## 2019-06-04 NOTE — Brief Op Note (Signed)
06/04/2019  2:40 PM  PATIENT:  Colleen Shannon  63 y.o. female  PRE-OPERATIVE DIAGNOSIS:  arthrofibrosis right knee  POST-OPERATIVE DIAGNOSIS:  arthrofibrosis right knee  PROCEDURE:  Procedure(s) with comments: Right knee arthrotomy; scar excision (Right) - 41min  SURGEON:  Surgeon(s) and Role:    [name], MD - Primary  PHYSICIAN ASSISTANT:   ASSISTANTS: Griffith Citron, PA-C   ANESTHESIA:   general  EBL:  50 mL   BLOOD ADMINISTERED:none  DRAINS: (Medium) Hemovact drain(s) in the right knee with  Suction Open   LOCAL MEDICATIONS USED:  NONE  COUNTS:  YES  TOURNIQUET:   Total Tourniquet Time Documented: Thigh (Right) - 14 minutes Total: Thigh (Right) - 14 minutes   DICTATION: .Other Dictation: Dictation Number [id number]  PLAN OF CARE: Admit for overnight observation  PATIENT DISPOSITION:  PACU - hemodynamically stable.

## 2019-06-04 NOTE — Anesthesia Procedure Notes (Signed)
Date/Time: 06/04/2019 2:40 PM Performed by: Cynda Familia, CRNA Oxygen Delivery Method: Simple face mask Placement Confirmation: positive ETCO2 and breath sounds checked- equal and bilateral Dental Injury: Teeth and Oropharynx as per pre-operative assessment

## 2019-06-04 NOTE — Op Note (Signed)
NAME: Colleen Shannon, Colleen Shannon MEDICAL RECORD [id number] ACCOUNT [id number] DATE OF BIRTH:May 13, 1956 FACILITY: [facility] LOCATION: [id number] PHYSICIAN:Cleone Hulick Zella Ball, MD  OPERATIVE REPORT  DATE OF PROCEDURE:  06/04/2019  PREOPERATIVE DIAGNOSIS:  Arthrofibrosis, right knee.  POSTOPERATIVE DIAGNOSIS:  Arthrofibrosis, right knee.  PROCEDURE:  Right knee arthrotomy with scar excision.  SURGEON:  [name], MD  ASSISTANT:  Griffith Citron, PA-C  ANESTHESIA:  General.  ESTIMATED BLOOD LOSS:  Minimal.  DRAINS:  Hemovac x1.  TOURNIQUET TIME:  14 minutes at 300 mmHg.  COMPLICATIONS:  None.  CONDITION:  Stable to recovery.  BRIEF CLINICAL NOTE:  The patient is a 63 year old female who had a right total knee performed in 2015.  She had difficulties with range of motion and had a closed manipulation.  She did well initially, but then has had progressive loss of motion in the  past 2 years.  She presents with a very stiff knee, flexion and maximum of 85 degrees.  She was having difficulty with activities of daily living and functional activities.  She presents now for arthrotomy and scar excision in attempt to regain more  motion.  Her workup preoperatively showed no evidence of any loosening of her components.  No signs of infection.  PROCEDURE IN DETAIL:  After successful administration of a general anesthetic, a tourniquet was placed high on her right thigh and right lower extremity prepped and draped in the usual sterile fashion.  Exam under anesthesia shows that the range of  motion is 0-85 degrees.  Right lower extremity was wrapped in Esmarch.  Knee flexed and tourniquet inflated to 300 mmHg.  Midline incision was made with a 10 blade through subcutaneous tissue.  Subcutaneous flaps were created circumferentially to remove  any scarring in the subcutaneous space.  A fresh blade was used to make a medial parapatellar arthrotomy.  Soft tissue over the proximal medial tibia subperiosteally  elevated the joint line with a knife and into the semimembranosus bursa with a Cobb  elevator.  I then debrided a large amount of scar tissue from under the extensor mechanism, both medial and lateral.  I also debrided the infrapatellar fat pad as that was severely contracted.  Further mobilization of tissues performed in the  suprapatellar region.  When all scar the tissue was eventually removed and I placed the knee through a range of motion, I easily was able to achieve 130 degrees of flexion.  The patella tracks normally.  Wound was then copiously irrigated with saline  solution.  The arthrotomy was closed with a running 0 Stratafix suture.  Flexion against gravity was again close to 130 degrees.  Tourniquet was then released.  Total time 14 minutes.  Subcutaneous was closed with interrupted 2-0 Vicryl and subcuticular  running 4-0 Monocryl.  Incisions were cleaned and dried and Steri-Strips and a bulky sterile dressing applied.  She was then awakened and transported to recovery in stable condition.  TN/NUANCE  D:06/04/2019 T:06/04/2019 JOB:[id number]

## 2019-06-04 NOTE — Discharge Instructions (Signed)
Dr. [name] Total Joint Specialist [facility] [address], [place], [place] [address] [phone]  POSTOPERATIVE DIRECTIONS  Knee Rehabilitation, Guidelines Following Surgery  Results after knee surgery are often greatly improved when you follow the exercise, range of motion and muscle strengthening exercises prescribed by your doctor. Safety measures are also important to protect the knee from further injury. Any time any of these exercises cause you to have increased pain or swelling in your knee joint, decrease the amount until you are comfortable again and slowly increase them. If you have problems or questions, call your caregiver or physical therapist for advice.   HOME CARE INSTRUCTIONS  Remove items at home which could result in a fall. This includes throw rugs or furniture in walking pathways.   ICE to the affected knee every three hours for 30 minutes at a time and then as needed for pain and swelling.  Continue to use ice on the knee for pain and swelling from surgery. You may notice swelling that will progress down to the foot and ankle.  This is normal after surgery.  Elevate the leg when you are not up walking on it.    Continue to use the breathing machine which will help keep your temperature down.  It is common for your temperature to cycle up and down following surgery, especially at night when you are not up moving around and exerting yourself.  The breathing machine keeps your lungs expanded and your temperature down.  Do not place pillow under knee, focus on keeping the knee straight while resting  DIET You may resume your previous home diet once your are discharged from the hospital.  DRESSING / WOUND CARE / SHOWERING You may shower 3 days after surgery, but keep the wounds dry during showering.  You may use an occlusive plastic wrap (Press'n Seal for example), NO SOAKING/SUBMERGING IN THE BATHTUB.  If the bandage gets wet, change with a  clean dry gauze.  If the incision gets wet, pat the wound dry with a clean towel. You may start showering once you are discharged home but do not submerge the incision under water. Just pat the incision dry and apply a dry gauze dressing on daily. Change the surgical dressing daily and reapply a dry dressing each time.  ACTIVITY Walk with your walker as instructed. Use walker as long as suggested by your caregivers. Avoid periods of inactivity such as sitting longer than an hour when not asleep. This helps prevent blood clots.  You may resume a sexual relationship in one month or when given the OK by your doctor.  You may return to work once you are cleared by your doctor.  Do not drive a car for 6 weeks or until released by you surgeon.  Do not drive while taking narcotics.  WEIGHT BEARING Weight bearing as tolerated with assist device (walker, cane, etc) as directed, use it as long as suggested by your surgeon or therapist, typically at least 4-6 weeks.  POSTOPERATIVE CONSTIPATION PROTOCOL Constipation - defined medically as fewer than three stools per week and severe constipation as less than one stool per week.  One of the most common issues patients have following surgery is constipation.  Even if you have a regular bowel pattern at home, your normal regimen is likely to be disrupted due to multiple reasons following surgery.  Combination of anesthesia, postoperative narcotics, change in appetite and fluid intake all can affect your bowels.  In order to  avoid complications following surgery, here are some recommendations in order to help you during your recovery period. ° °Colace (docusate) - Pick up an over-the-counter form of Colace or another stool softener and take twice a day as long as you are requiring postoperative pain medications.  Take with a full glass of water daily.  If you experience loose stools or diarrhea, hold the colace until you stool forms back up.  If your symptoms do  not get better within 1 week or if they get worse, check with your doctor. ° °Dulcolax (bisacodyl) - Pick up over-the-counter and take as directed by the product packaging as needed to assist with the movement of your bowels.  Take with a full glass of water.  Use this product as needed if not relieved by Colace only.  ° °MiraLax (polyethylene glycol) - Pick up over-the-counter to have on hand.  MiraLax is a solution that will increase the amount of water in your bowels to assist with bowel movements.  Take as directed and can mix with a glass of water, juice, soda, coffee, or tea.  Take if you go more than two days without a movement. °Do not use MiraLax more than once per day. Call your doctor if you are still constipated or irregular after using this medication for 7 days in a row. ° °If you continue to have problems with postoperative constipation, please contact the office for further assistance and recommendations.  If you experience "the worst abdominal pain ever" or develop nausea or vomiting, please contact the office immediatly for further recommendations for treatment. ° °ITCHING ° If you experience itching with your medications, try taking only a single pain pill, or even half a pain pill at a time.  You can also use Benadryl over the counter for itching or also to help with sleep.  ° °TED HOSE STOCKINGS °Wear the elastic stockings on both legs for three weeks following surgery during the day but you may remove then at night for sleeping. ° °MEDICATIONS °See your medication summary on the “After Visit Summary” that the nursing staff will review with you prior to discharge.  You may have some home medications which will be placed on hold until you complete the course of blood thinner medication.  It is important for you to complete the blood thinner medication as prescribed by your surgeon.  Continue your approved medications as instructed at time of discharge. ° °PRECAUTIONS °If you experience chest pain  or shortness of breath - call 911 immediately for transfer to the hospital emergency department.  °If you develop a fever greater that 101 F, purulent drainage from wound, increased redness or drainage from wound, foul odor from the wound/dressing, or calf pain - CONTACT YOUR SURGEON.   °                                                °FOLLOW-UP APPOINTMENTS °Make sure you keep all of your appointments after your operation with your surgeon and caregivers. You should call the office at the above phone number and make an appointment for approximately two weeks after the date of your surgery or on the date instructed by your surgeon outlined in the "After Visit Summary". ° ° °RANGE OF MOTION AND STRENGTHENING EXERCISES  °Rehabilitation of the knee is important following a knee injury or an operation. After   just a few days of immobilization, the muscles of the thigh which control the knee become weakened and shrink (atrophy). Knee exercises are designed to build up the tone and strength of the thigh muscles and to improve knee motion. Often times heat used for twenty to thirty minutes before working out will loosen up your tissues and help with improving the range of motion but do not use heat for the first two weeks following surgery. These exercises can be done on a training (exercise) mat, on the floor, on a table or on a bed. Use what ever works the best and is most comfortable for you Knee exercises include:  °Leg Lifts - While your knee is still immobilized in a splint or cast, you can do straight leg raises. Lift the leg to 60 degrees, hold for 3 sec, and slowly lower the leg. Repeat 10-20 times 2-3 times daily. Perform this exercise against resistance later as your knee gets better.  °Quad and Hamstring Sets - Tighten up the muscle on the front of the thigh (Quad) and hold for 5-10 sec. Repeat this 10-20 times hourly. Hamstring sets are done by pushing the foot backward against an object and holding for 5-10  sec. Repeat as with quad sets.  °· Leg Slides: Lying on your back, slowly slide your foot toward your buttocks, bending your knee up off the floor (only go as far as is comfortable). Then slowly slide your foot back down until your leg is flat on the floor again. °· Angel Wings: Lying on your back spread your legs to the side as far apart as you can without causing discomfort.  °A rehabilitation program following serious knee injuries can speed recovery and prevent re-injury in the future due to weakened muscles. Contact your doctor or a physical therapist for more information on knee rehabilitation.  ° °IF YOU ARE TRANSFERRED TO A SKILLED REHAB FACILITY °If the patient is transferred to a skilled rehab facility following release from the hospital, a list of the current medications will be sent to the facility for the patient to continue.  When discharged from the skilled rehab facility, please have the facility set up the patient's Home Health Physical Therapy prior to being released. Also, the skilled facility will be responsible for providing the patient with their medications at time of release from the facility to include their pain medication, the muscle relaxants, and their blood thinner medication. If the patient is still at the rehab facility at time of the two week follow up appointment, the skilled rehab facility will also need to assist the patient in arranging follow up appointment in our office and any transportation needs. ° °MAKE SURE YOU:  °Understand these instructions.  °Get help right away if you are not doing well or get worse.  ° ° °Pick up stool softner and laxative for home use following surgery while on pain medications. °Do not submerge incision under water. °Please use good hand washing techniques while changing dressing each day. °May shower starting three days after surgery. °Please use a clean towel to pat the incision dry following showers. °Continue to use ice for pain and swelling  after surgery. °Do not use any lotions or creams on the incision until instructed by your surgeon. °

## 2019-06-04 NOTE — Anesthesia Procedure Notes (Signed)
Procedure Name: LMA Insertion Date/Time: 06/04/2019 1:50 PM Performed by: Cynda Familia, CRNA Pre-anesthesia Checklist: Patient identified, Emergency Drugs available, Suction available and Patient being monitored Patient Re-evaluated:Patient Re-evaluated prior to induction Oxygen Delivery Method: Circle System Utilized Preoxygenation: Pre-oxygenation with 100% oxygen Induction Type: IV induction Ventilation: Mask ventilation without difficulty LMA: LMA inserted LMA Size: 4.0 Number of attempts: 1 Placement Confirmation: positive ETCO2 Tube secured with: Tape (20 cc air) Dental Injury: Teeth and Oropharynx as per pre-operative assessment  Comments: Smooth IV induction Fransisco Beau-- LMA AM CRNA atraumatic-- teeth and mouth as preop

## 2019-06-04 NOTE — H&P (Signed)
CC- Colleen Shannon is a 63 y.o. female who presents with right knee stiffness.  HPI- . Knee Pain: Patient presents with stiffness involving the  right knee. Onset of the symptoms was several years ago. Inciting event: She had a right Total Knee Arthroplasty in 05/2014 and had post-operative stiffness with subsequent closed manipulation. She did well initially but over the past 1-2 years has had worsening stiffness in the knee. She has a left knee unicompartmental arthroplasty with excellent range of motion and desires more motion in the right knee. She presents for right knee arthrotomy and scar excision. Current symptoms include stiffness. Pain is aggravated by going up and down stairs, rising after sitting and squatting.  Patient has had prior knee problems. Treatment to date: PT which was not very effective.  Past Medical History:  Diagnosis Date  . Arthritis   . Cancer (HCC)    hx of skin cancer   . CLL (chronic lymphocytic leukemia) ([place])   . Elevated cholesterol 05/22/12   Nuc stress test-Normal. The patiet had a markedly positive GXT for ischemia. post stress EF 72%  . Endometrial polyp   . GERD (gastroesophageal reflux disease)   . Grade I diastolic dysfunction    noted on echo  . Hypertension 05/22/12   ECHO-EF >55% trace tricupsid regurgitation. There is mild mitral regurgitation.   . MR (mitral regurgitation)    mild noted on echo  . Plantar fasciitis of left foot   . Pneumonia    history of  . PONV (postoperative nausea and vomiting)     Past Surgical History:  Procedure Laterality Date  . ABDOMINAL SURGERY     Abdominoplasty  . CESAREAN SECTION     X 2  . CHOLECYSTECTOMY    . COLONSCOPY    . DILATION AND CURETTAGE OF UTERUS    . FOOT SURGERY    . HYSTEROSCOPY    . KNEE CLOSED REDUCTION Right 07/20/2014   Procedure: RIGHT KNEE CLOSED MANIPULATION;  Surgeon: [name], MD;  Location: [facility];  Service: Orthopedics;  Laterality: Right;  . KNEE SURGERY     arthroscopic  . PARTIAL KNEE ARTHROPLASTY Left   . RADIOACTIVE SEED GUIDED EXCISIONAL BREAST BIOPSY N/A 10/06/2014   Procedure: RADIOACTIVE SEED GUIDED EXCISIONAL BREAST BIOPSY;  Surgeon: Rolm Bookbinder, MD;  Location: [facility];  Service: General;  Laterality: N/A;  . REFRACTIVE SURGERY    . TOTAL KNEE ARTHROPLASTY Right 05/11/2014   Procedure: RIGHT TOTAL KNEE ARTHROPLASTY;  Surgeon: [name], MD;  Location: [facility];  Service: Orthopedics;  Laterality: Right;  . TUBAL LIGATION      Prior to Admission medications   Medication Sig Start Date End Date Taking? Authorizing Provider  acetaminophen (TYLENOL) 325 MG tablet Take 650 mg by mouth once as needed for mild pain or headache.   Yes [provider]  amLODipine (NORVASC) 5 MG tablet TAKE 1 TABLET BY MOUTH AT  BEDTIME Patient taking differently: Take 5 mg by mouth at bedtime.  08/29/18  Yes Troy Sine, MD  Ascorbic Acid (VITAMIN C) 1000 MG tablet Take 1,000 mg by mouth daily.   Yes [provider]  aspirin EC 81 MG tablet Take 81 mg by mouth every morning.   Yes [provider]  atorvastatin (LIPITOR) 20 MG tablet TAKE 1 TABLET BY MOUTH  DAILY Patient taking differently: Take 20 mg by mouth at bedtime.  03/04/19  Yes Troy Sine, MD  Calcium Carb-Cholecalciferol (CALCIUM-VITAMIN D3)  600-400 MG-UNIT TABS Take 1 tablet by mouth 2 (two) times daily.   Yes [provider]  calcium carbonate (TUMS - DOSED IN MG ELEMENTAL CALCIUM) 500 MG chewable tablet Chew 2 tablets by mouth daily as needed for indigestion or heartburn.   Yes [provider]  carvedilol (COREG) 12.5 MG tablet TAKE 1 TABLET BY MOUTH  TWICE A DAY WITH A MEAL ,  CAN TAKE 1/2 TABLET AS  NEEDED FOR PALPITATIONS Patient taking differently: Take 12.5 mg by mouth 2 (two) times daily with a meal.  03/04/19  Yes Troy Sine, MD  Cholecalciferol (DIALYVITE VITAMIN D 5000) 125 MCG (5000 UT) capsule Take 5,000 Units  by mouth daily.   Yes [provider]  ezetimibe (ZETIA) 10 MG tablet Take 1 tablet (10 mg total) by mouth at bedtime. 05/27/19  Yes Troy Sine, MD  ibuprofen (ADVIL) 200 MG tablet Take 400 mg by mouth daily as needed for headache or moderate pain.   Yes [provider]  Multiple Vitamin (MULTIVITAMIN WITH MINERALS) TABS tablet Take 1 tablet by mouth daily.   Yes [provider]  omeprazole (PRILOSEC) 40 MG capsule Take 40 mg by mouth daily.   Yes [provider]  Simethicone (PHAZYME) 180 MG CAPS Take 180 mg by mouth 2 (two) times daily.   Yes [provider]  tamoxifen (NOLVADEX) 20 MG tablet Take 0.5 tablets (10 mg total) by mouth daily. Patient taking differently: Take 5 mg by mouth daily.  11/28/18  Yes Nicholas Lose, MD  TURMERIC PO Take 1 tablet by mouth 2 (two) times daily.   Yes [provider]   KNEE EXAM antalgic gait, no effusion, range 0-85 with no tenderness or instability  Physical Examination: General appearance - alert, well appearing, and in no distress Mental status - alert, oriented to person, place, and time Chest - clear to auscultation, no wheezes, rales or rhonchi, symmetric air entry Heart - normal rate, regular rhythm, normal S1, S2, no murmurs, rubs, clicks or gallops Abdomen - soft, nontender, nondistended, no masses or organomegaly Neurological - alert, oriented, normal speech, no focal findings or movement disorder noted    Asessment/Plan--- Right knee arthrofibrosis- - Plan right knee arthrotomy with scar excision. Procedure risks and potential comps discussed with patient who elects to proceed. Goals are decreased pain and increased function with a high likelihood of achieving both

## 2019-06-04 NOTE — Transfer of Care (Signed)
Immediate Anesthesia Transfer of Care Note  Patient: Colleen Shannon  Procedure(s) Performed: Right knee arthrotomy; scar excision (Right Knee)  Patient Location: PACU  Anesthesia Type:General  Level of Consciousness: awake and alert   Airway & Oxygen Therapy: Patient Spontanous Breathing and Patient connected to face mask oxygen  Post-op Assessment: Report given to RN and Post -op Vital signs reviewed and stable  Post vital signs: Reviewed and stable  Last Vitals:  Vitals Value Taken Time  BP    Temp    Pulse 82 06/04/19 1454  Resp 17 06/04/19 1454  SpO2 100 % 06/04/19 1454  Vitals shown include unvalidated device data.  Last Pain:  Vitals:   06/04/19 1250  TempSrc:   PainSc: 0-No pain         Complications: No apparent anesthesia complications

## 2019-06-05 ENCOUNTER — Encounter (HOSPITAL_COMMUNITY): Payer: Self-pay | Admitting: Orthopedic Surgery

## 2019-06-05 DIAGNOSIS — M24661 Ankylosis, right knee: Secondary | ICD-10-CM | POA: Diagnosis not present

## 2019-06-05 MED ORDER — HYDROCODONE-ACETAMINOPHEN 5-325 MG PO TABS: 1.0000 | ORAL_TABLET | ORAL | 0 refills | Status: DC | PRN

## 2019-06-05 MED ORDER — ASPIRIN 325 MG PO TBEC: 325.0000 mg | DELAYED_RELEASE_TABLET | Freq: Every day | ORAL | 0 refills | Status: DC

## 2019-06-05 MED ORDER — METHOCARBAMOL 500 MG PO TABS: 500.0000 mg | ORAL_TABLET | Freq: Four times a day (QID) | ORAL | 0 refills | Status: DC | PRN

## 2019-06-05 NOTE — Discharge Summary (Signed)
Physician Discharge Summary   Patient ID: ENEYDA BOLINSKI MRN: [id number] DOB/AGE: [birth year] 63 y.o.  Admit date: 06/04/2019 Discharge date: 06/05/19  Primary Diagnosis: Arthrofibrosis, right knee   Admission Diagnoses:  Past Medical History:  Diagnosis Date   Arthritis    Cancer ([place])    hx of skin cancer    CLL (chronic lymphocytic leukemia) ([place])    Elevated cholesterol 05/22/12   Nuc stress test-Normal. The patiet had a markedly positive GXT for ischemia. post stress EF 72%   Endometrial polyp    GERD (gastroesophageal reflux disease)    Grade I diastolic dysfunction    noted on echo   Hypertension 05/22/12   ECHO-EF >55% trace tricupsid regurgitation. There is mild mitral regurgitation.    MR (mitral regurgitation)    mild noted on echo   Plantar fasciitis of left foot    Pneumonia    history of   PONV (postoperative nausea and vomiting)    Discharge Diagnoses:   Active Problems:   Arthrofibrosis of total knee arthroplasty (HCC)  Estimated body mass index is 28.33 kg/m as calculated from the following:   Height as of this encounter: 5\' 1"  (1.549 m).   Weight as of this encounter: 68 kg.  Procedure:  Procedure(s) (LRB): Right knee arthrotomy; scar excision (Right)   Consults: None  HPI: Patient presents with stiffness involving the  right knee. Onset of the symptoms was several years ago. Inciting event: She had a right Total Knee Arthroplasty in 05/2014 and had post-operative stiffness with subsequent closed manipulation. She did well initially but over the past 1-2 years has had worsening stiffness in the knee. She has a left knee unicompartmental arthroplasty with excellent range of motion and desires more motion in the right knee. She presents for right knee arthrotomy and scar excision. Current symptoms include stiffness. Pain is aggravated by going up and down stairs, rising after sitting and squatting.  Patient has had prior knee problems. Treatment  to date: PT which was not very effective.  Laboratory Data: Hospital Outpatient Visit on 05/31/2019  Component Date Value Ref Range Status   SARS Coronavirus 2 05/31/2019 NEGATIVE  NEGATIVE Final   Comment: (NOTE) SARS-CoV-2 target nucleic acids are NOT DETECTED. The SARS-CoV-2 RNA is generally detectable in upper and lower respiratory specimens during the acute phase of infection. Negative results do not preclude SARS-CoV-2 infection, do not rule out co-infections with other pathogens, and should not be used as the sole basis for treatment or other patient management decisions. Negative results must be combined with clinical observations, patient history, and epidemiological information. The expected result is Negative. Fact Sheet for Patients: SugarRoll.be Fact Sheet for Healthcare Providers: https://www.woods-mathews.com/ This test is not yet approved or cleared by the [place] FDA and  has been authorized for detection and/or diagnosis of SARS-CoV-2 by FDA under an Emergency Use Authorization (EUA). This EUA will remain  in effect (meaning this test can be used) for the duration of the COVID-19 declaration under Section 56                          4(b)(1) of the Act, 21 U.S.C. section 360bbb-3(b)(1), unless the authorization is terminated or revoked sooner. Performed at [facility], [place] [address] [place], [place] [address]   Appointment on 05/29/2019  Component Date Value Ref Range Status   Sodium 05/29/2019 141  135 - 145 mmol/L Final   Potassium 05/29/2019 4.3  3.5 -  5.1 mmol/L Final   Chloride 05/29/2019 106  98 - 111 mmol/L Final   CO2 05/29/2019 25  22 - 32 mmol/L Final   Glucose, Bld 05/29/2019 120* 70 - 99 mg/dL Final   BUN 05/29/2019 16  8 - 23 mg/dL Final   Creatinine 05/29/2019 0.80  0.44 - 1.00 mg/dL Final   Calcium 05/29/2019 8.8* 8.9 - 10.3 mg/dL Final   Total Protein 05/29/2019 6.3* 6.5 - 8.1  g/dL Final   Albumin 05/29/2019 3.9  3.5 - 5.0 g/dL Final   AST 05/29/2019 26  15 - 41 U/L Final   ALT 05/29/2019 23  0 - 44 U/L Final   Alkaline Phosphatase 05/29/2019 34* 38 - 126 U/L Final   Total Bilirubin 05/29/2019 0.3  0.3 - 1.2 mg/dL Final   GFR, Est Non Af Am 05/29/2019 >60  >60 mL/min Final   GFR, Est AFR Am 05/29/2019 >60  >60 mL/min Final   Anion gap 05/29/2019 10  5 - 15 Final   Performed at [facility], [place] [address] [place], [place] [address]   LDH 05/29/2019 311* 98 - 192 U/L Final   Performed at [facility], [place] [address] [place], [place] [address]   WBC Count 05/29/2019 39.9* 4.0 - 10.5 K/uL Final   RBC 05/29/2019 3.87  3.87 - 5.11 MIL/uL Final   Hemoglobin 05/29/2019 12.2  12.0 - 15.0 g/dL Final   HCT 05/29/2019 38.3  36.0 - 46.0 % Final   MCV 05/29/2019 99.0  80.0 - 100.0 fL Final   MCH 05/29/2019 31.5  26.0 - 34.0 pg Final   MCHC 05/29/2019 31.9  30.0 - 36.0 g/dL Final   RDW 05/29/2019 13.2  11.5 - 15.5 % Final   Platelet Count 05/29/2019 144* 150 - 400 K/uL Final   nRBC 05/29/2019 0.1  0.0 - 0.2 % Final   Neutrophils Relative % 05/29/2019 6  % Final   Neutro Abs 05/29/2019 2.6  1.7 - 7.7 K/uL Final   Lymphocytes Relative 05/29/2019 89  % Final   Lymphs Abs 05/29/2019 35.5* 0.7 - 4.0 K/uL Final   Monocytes Relative 05/29/2019 4  % Final   Monocytes Absolute 05/29/2019 1.5* 0.1 - 1.0 K/uL Final   Eosinophils Relative 05/29/2019 1  % Final   Eosinophils Absolute 05/29/2019 0.2  0.0 - 0.5 K/uL Final   Basophils Relative 05/29/2019 0  % Final   Basophils Absolute 05/29/2019 0.0  0.0 - 0.1 K/uL Final   WBC Morphology 05/29/2019 ATYPICAL  LYMPHS PRESENT, SOME CONTAINING NUCLEOLI   Final   Immature Granulocytes 05/29/2019 0  % Final   Abs Immature Granulocytes 05/29/2019 0.06  0.00 - 0.07 K/uL Final   Smudge Cells 05/29/2019 PRESENT   Final   Performed at [facility], [place] [address], [place], [place] [address]      EKG: Orders placed or performed in visit on 08/29/18   EKG 12-Lead     Hospital Course: Colleen Shannon is a 63 y.o. who was admitted to [facility]. They were brought to the operating room on 06/04/2019 and underwent Procedure(s): Right knee arthrotomy; scar excision.  Patient tolerated the procedure well and was later transferred to the recovery room and then to the orthopaedic floor for postoperative care.  They were given PO and IV analgesics for pain control following their surgery.  They were given 24 hours of postoperative antibiotics of  Anti-infectives (From admission, onward)  Start     Dose/Rate Route Frequency Ordered Stop   06/04/19 1230  ceFAZolin (ANCEF) IVPB 2g/100 mL premix     2 g 200 mL/hr over 30 Minutes Intravenous On call to O.R. 06/04/19 1224 06/04/19 1352     and started on DVT prophylaxis in the form of Aspirin.   PT and OT were ordered for total joint protocol.  Discharge planning consulted to help with postop disposition and equipment needs.  Patient had a good night on the evening of surgery.  They started to get up OOB with therapy on day one. Hemovac drain was pulled without difficulty. The patient had progressed with therapy and meeting their goals.  Incision was healing well.  Patient was seen in rounds and was ready to go home.   Diet: Cardiac diet Activity:WBAT Follow-up:in 2 weeks Disposition - Home Discharged Condition: stable   Discharge Instructions    Call MD / Call 911   Complete by: As directed    If you experience chest pain or shortness of breath, CALL 911 and be transported to the hospital emergency room.  If you develope a fever above 101 F, pus (white drainage) or increased drainage or redness at the wound, or calf pain, call your surgeon's office.   Constipation Prevention   Complete by: As directed    Drink plenty of fluids.  Prune juice may be helpful.  You  may use a stool softener, such as Colace (over the counter) 100 mg twice a day.  Use MiraLax (over the counter) for constipation as needed.   Diet - low sodium heart healthy   Complete by: As directed    Discharge instructions   Complete by: As directed    Dr. [name] Total Joint Specialist [facility] [address] [place], [place] [address] [phone]  POSTOPERATIVE DIRECTIONS  Knee Rehabilitation, Guidelines Following Surgery  Results after knee surgery are often greatly improved when you follow the exercise, range of motion and muscle strengthening exercises prescribed by your doctor. Safety measures are also important to protect the knee from further injury. Any time any of these exercises cause you to have increased pain or swelling in your knee joint, decrease the amount until you are comfortable again and slowly increase them. If you have problems or questions, call your caregiver or physical therapist for advice.   HOME CARE INSTRUCTIONS  Remove items at home which could result in a fall. This includes throw rugs or furniture in walking pathways.  ICE to the affected knee every three hours for 30 minutes at a time and then as needed for pain and swelling.  Continue to use ice on the knee for pain and swelling from surgery. You may notice swelling that will progress down to the foot and ankle.  This is normal after surgery.  Elevate the leg when you are not up walking on it.   Continue to use the breathing machine which will help keep your temperature down.  It is common for your temperature to cycle up and down following surgery, especially at night when you are not up moving around and exerting yourself.  The breathing machine keeps your lungs expanded and your temperature down. Do not place pillow under knee, focus on keeping the knee straight while resting  DIET You may resume your previous home diet once your are discharged from the hospital.  DRESSING /  WOUND CARE / SHOWERING You may shower 3 days after surgery, but keep the wounds  dry during showering.  You may use an occlusive plastic wrap (Press'n Seal for example), NO SOAKING/SUBMERGING IN THE BATHTUB.  If the bandage gets wet, change with a clean dry gauze.  If the incision gets wet, pat the wound dry with a clean towel. You may start showering once you are discharged home but do not submerge the incision under water. Just pat the incision dry and apply a dry gauze dressing on daily. Change the surgical dressing daily and reapply a dry dressing each time.  ACTIVITY Walk with your walker as instructed. Use walker as long as suggested by your caregivers. Avoid periods of inactivity such as sitting longer than an hour when not asleep. This helps prevent blood clots.  You may resume a sexual relationship in one month or when given the OK by your doctor.  You may return to work once you are cleared by your doctor.  Do not drive a car for 6 weeks or until released by you surgeon.  Do not drive while taking narcotics.  WEIGHT BEARING Weight bearing as tolerated with assist device (walker, cane, etc) as directed, use it as long as suggested by your surgeon or therapist, typically at least 4-6 weeks.  POSTOPERATIVE CONSTIPATION PROTOCOL Constipation - defined medically as fewer than three stools per week and severe constipation as less than one stool per week.  One of the most common issues patients have following surgery is constipation.  Even if you have a regular bowel pattern at home, your normal regimen is likely to be disrupted due to multiple reasons following surgery.  Combination of anesthesia, postoperative narcotics, change in appetite and fluid intake all can affect your bowels.  In order to avoid complications following surgery, here are some recommendations in order to help you during your recovery period.  Colace (docusate) - Pick up an over-the-counter form of Colace or another  stool softener and take twice a day as long as you are requiring postoperative pain medications.  Take with a full glass of water daily.  If you experience loose stools or diarrhea, hold the colace until you stool forms back up.  If your symptoms do not get better within 1 week or if they get worse, check with your doctor.  Dulcolax (bisacodyl) - Pick up over-the-counter and take as directed by the product packaging as needed to assist with the movement of your bowels.  Take with a full glass of water.  Use this product as needed if not relieved by Colace only.   MiraLax (polyethylene glycol) - Pick up over-the-counter to have on hand.  MiraLax is a solution that will increase the amount of water in your bowels to assist with bowel movements.  Take as directed and can mix with a glass of water, juice, soda, coffee, or tea.  Take if you go more than two days without a movement. Do not use MiraLax more than once per day. Call your doctor if you are still constipated or irregular after using this medication for 7 days in a row.  If you continue to have problems with postoperative constipation, please contact the office for further assistance and recommendations.  If you experience "the worst abdominal pain ever" or develop nausea or vomiting, please contact the office immediatly for further recommendations for treatment.  ITCHING  If you experience itching with your medications, try taking only a single pain pill, or even half a pain pill at a time.  You can also use Benadryl over the counter for  itching or also to help with sleep.   TED HOSE STOCKINGS Wear the elastic stockings on both legs for three weeks following surgery during the day but you may remove then at night for sleeping.  MEDICATIONS See your medication summary on the "After Visit Summary" that the nursing staff will review with you prior to discharge.  You may have some home medications which will be placed on hold until you complete the  course of blood thinner medication.  It is important for you to complete the blood thinner medication as prescribed by your surgeon.  Continue your approved medications as instructed at time of discharge.  PRECAUTIONS If you experience chest pain or shortness of breath - call 911 immediately for transfer to the hospital emergency department.  If you develop a fever greater that 101 F, purulent drainage from wound, increased redness or drainage from wound, foul odor from the wound/dressing, or calf pain - CONTACT YOUR SURGEON.                                                   FOLLOW-UP APPOINTMENTS Make sure you keep all of your appointments after your operation with your surgeon and caregivers. You should call the office at the above phone number and make an appointment for approximately two weeks after the date of your surgery or on the date instructed by your surgeon outlined in the "After Visit Summary".   RANGE OF MOTION AND STRENGTHENING EXERCISES  Rehabilitation of the knee is important following a knee injury or an operation. After just a few days of immobilization, the muscles of the thigh which control the knee become weakened and shrink (atrophy). Knee exercises are designed to build up the tone and strength of the thigh muscles and to improve knee motion. Often times heat used for twenty to thirty minutes before working out will loosen up your tissues and help with improving the range of motion but do not use heat for the first two weeks following surgery. These exercises can be done on a training (exercise) mat, on the floor, on a table or on a bed. Use what ever works the best and is most comfortable for you Knee exercises include:  Leg Lifts - While your knee is still immobilized in a splint or cast, you can do straight leg raises. Lift the leg to 60 degrees, hold for 3 sec, and slowly lower the leg. Repeat 10-20 times 2-3 times daily. Perform this exercise against resistance later as your  knee gets better.  Quad and Hamstring Sets - Tighten up the muscle on the front of the thigh (Quad) and hold for 5-10 sec. Repeat this 10-20 times hourly. Hamstring sets are done by pushing the foot backward against an object and holding for 5-10 sec. Repeat as with quad sets.  Leg Slides: Lying on your back, slowly slide your foot toward your buttocks, bending your knee up off the floor (only go as far as is comfortable). Then slowly slide your foot back down until your leg is flat on the floor again. Angel Wings: Lying on your back spread your legs to the side as far apart as you can without causing discomfort.  A rehabilitation program following serious knee injuries can speed recovery and prevent re-injury in the future due to weakened muscles. Contact your doctor or a physical therapist  for more information on knee rehabilitation.   IF YOU ARE TRANSFERRED TO A SKILLED REHAB FACILITY If the patient is transferred to a skilled rehab facility following release from the hospital, a list of the current medications will be sent to the facility for the patient to continue.  When discharged from the skilled rehab facility, please have the facility set up the patient's [place] prior to being released. Also, the skilled facility will be responsible for providing the patient with their medications at time of release from the facility to include their pain medication, the muscle relaxants, and their blood thinner medication. If the patient is still at the rehab facility at time of the two week follow up appointment, the skilled rehab facility will also need to assist the patient in arranging follow up appointment in our office and any transportation needs.  MAKE SURE YOU:  Understand these instructions.  Get help right away if you are not doing well or get worse.    Pick up stool softner and laxative for home use following surgery while on pain medications. Do not submerge incision under  water. Please use good hand washing techniques while changing dressing each day. May shower starting three days after surgery. Please use a clean towel to pat the incision dry following showers. Continue to use ice for pain and swelling after surgery. Do not use any lotions or creams on the incision until instructed by your surgeon.   Increase activity slowly as tolerated   Complete by: As directed      Allergies as of 06/05/2019   No Known Allergies     Medication List    STOP taking these medications   ibuprofen 200 MG tablet Commonly known as: ADVIL     TAKE these medications   acetaminophen 325 MG tablet Commonly known as: TYLENOL Take 650 mg by mouth once as needed for mild pain or headache.   amLODipine 5 MG tablet Commonly known as: NORVASC TAKE 1 TABLET BY MOUTH AT  BEDTIME   aspirin 325 MG EC tablet Take 1 tablet (325 mg total) by mouth daily. What changed:   medication strength  how much to take  when to take this   atorvastatin 20 MG tablet Commonly known as: LIPITOR TAKE 1 TABLET BY MOUTH  DAILY What changed: when to take this   calcium carbonate 500 MG chewable tablet Commonly known as: TUMS - dosed in mg elemental calcium Chew 2 tablets by mouth daily as needed for indigestion or heartburn.   Calcium-Vitamin D3 600-400 MG-UNIT Tabs Take 1 tablet by mouth 2 (two) times daily.   carvedilol 12.5 MG tablet Commonly known as: COREG TAKE 1 TABLET BY MOUTH  TWICE A DAY WITH A MEAL ,  CAN TAKE 1/2 TABLET AS  NEEDED FOR PALPITATIONS What changed: See the new instructions.   Dialyvite Vitamin D 5000 125 MCG (5000 UT) capsule Generic drug: Cholecalciferol Take 5,000 Units by mouth daily.   ezetimibe 10 MG tablet Commonly known as: ZETIA Take 1 tablet (10 mg total) by mouth at bedtime.   HYDROcodone-acetaminophen 5-325 MG tablet Commonly known as: NORCO/VICODIN Take 1-2 tablets by mouth every 4 (four) hours as needed for moderate pain (pain score  4-6).   methocarbamol 500 MG tablet Commonly known as: ROBAXIN Take 1 tablet (500 mg total) by mouth every 6 (six) hours as needed for muscle spasms.   multivitamin with minerals Tabs tablet Take 1 tablet by mouth daily.   omeprazole 40  MG capsule Commonly known as: PRILOSEC Take 40 mg by mouth daily.   Phazyme 180 MG Caps Generic drug: Simethicone Take 180 mg by mouth 2 (two) times daily.   tamoxifen 20 MG tablet Commonly known as: NOLVADEX Take 0.5 tablets (10 mg total) by mouth daily. What changed: how much to take   TURMERIC PO Take 1 tablet by mouth 2 (two) times daily.   vitamin C 1000 MG tablet Take 1,000 mg by mouth daily.            Durable Medical Equipment  (From admission, onward)         Start     Ordered   06/04/19 1649  For Home Use Only DME CPM  Once    Question Answer Comment  Laterality Right Knee   Starting Flexion 0   Ending Flexion 90   Increase by Daily 10 degrees      06/04/19 1648         Follow-up Information    [name], MD. Schedule an appointment as soon as possible for a visit on 06/17/2019.   Specialty: Orthopedic Surgery Contact information: [address] [place] [address] [id number]           Signed: Ardeen Jourdain, PA-C Orthopaedic Surgery 06/05/2019, 7:25 AM

## 2019-06-05 NOTE — Progress Notes (Signed)
   Subjective: 1 Day Post-Op Procedure(s) (LRB): Right knee arthrotomy; scar excision (Right) Patient reports pain as mild.   Patient seen in rounds with Dr. Wynelle Link. Patient is well, and has had no acute complaints or problems. No issues overnight. No SOB or chest pain. Voiding well. And positive flatus. Motivated to get home.  Plan is to go Home after hospital stay.  Objective: Vital signs in last 24 hours: Temp:  [97.9 F (36.6 C)-100.1 F (37.8 C)] 98.1 F (36.7 C) (08/27 0558) Pulse Rate:  [66-86] 66 (08/27 0558) Resp:  [7-20] 20 (08/27 0558) BP: (108-152)/(45-75) 152/64 (08/27 0558) SpO2:  [93 %-100 %] 100 % (08/27 0558) Weight:  [68 kg] 68 kg (08/26 1250)  Intake/Output from previous day:  Intake/Output Summary (Last 24 hours) at 06/05/2019 [id number] Last data filed at 06/05/2019 [id number] Gross per 24 hour  Intake 2286.25 ml  Output 1910 ml  Net 376.25 ml     EXAM General - Patient is Alert and Oriented Extremity - Neurologically intact Intact pulses distally Dorsiflexion/Plantar flexion intact No cellulitis present Compartment soft Dressing - dressing C/D/I Motor Function - intact, moving foot and toes well on exam.  Hemovac pulled without difficulty.  Past Medical History:  Diagnosis Date  . Arthritis   . Cancer (HCC)    hx of skin cancer   . CLL (chronic lymphocytic leukemia) ([place])   . Elevated cholesterol 05/22/12   Nuc stress test-Normal. The patiet had a markedly positive GXT for ischemia. post stress EF 72%  . Endometrial polyp   . GERD (gastroesophageal reflux disease)   . Grade I diastolic dysfunction    noted on echo  . Hypertension 05/22/12   ECHO-EF >55% trace tricupsid regurgitation. There is mild mitral regurgitation.   . MR (mitral regurgitation)    mild noted on echo  . Plantar fasciitis of left foot   . Pneumonia    history of  . PONV (postoperative nausea and vomiting)     Assessment/Plan: 1 Day Post-Op Procedure(s) (LRB): Right knee  arthrotomy; scar excision (Right) Active Problems:   Arthrofibrosis of total knee arthroplasty (HCC)  Estimated body mass index is 28.33 kg/m as calculated from the following:   Height as of this encounter: 5\' 1"  (1.549 m).   Weight as of this encounter: 68 kg. Advance diet Up with therapy D/C IV fluids when tolerating POs well  DVT Prophylaxis - Aspirin Weight-Bearing as tolerated   PT this morning. DC home today if she is meeting her goals. CPM for home. Follow up in 2 weeks. Discharge instructions given.   Ardeen Jourdain, PA-C Orthopaedic Surgery 06/05/2019, 7:22 AM

## 2019-06-05 NOTE — Evaluation (Signed)
Physical Therapy Evaluation Patient Details Name: Colleen Shannon MRN: [id number] DOB: Nov 22, 1955 Today's Date: 06/05/2019   History of Present Illness  Pt s/p R knee arthrotomy with scar excision.  Pt with hx of L UKR and R TKR(15)  Clinical Impression  Pt admitted as above and presenting with functional mobility limitations 2* decreased R LE strength/ROM and post op pain.  Pt should progress well to dc home with family assist.  Pt has OP PT scheduled for 06/09/19.  Home therex program provided in writing.    Follow Up Recommendations Follow surgeon's recommendation for DC plan and follow-up therapies    Equipment Recommendations  None recommended by PT    Recommendations for Other Services       Precautions / Restrictions Precautions Precautions: Knee;Fall Restrictions Weight Bearing Restrictions: No Other Position/Activity Restrictions: WBAT      Mobility  Bed Mobility Overal bed mobility: Modified Independent                Transfers Overall transfer level: Needs assistance   Transfers: Sit to/from Stand Sit to Stand: Min guard;Supervision         General transfer comment: cues or LE management and use of UEs to self assist  Ambulation/Gait Ambulation/Gait assistance: Min guard;Supervision Gait Distance (Feet): 220 Feet Assistive device: Rolling walker (2 wheeled) Gait Pattern/deviations: Step-to pattern;Step-through pattern;Decreased step length - right;Decreased step length - left;Shuffle     General Gait Details: cues for posture, position from RW and initial sequence  Stairs Stairs: Yes Stairs assistance: Min guard Stair Management: Two rails;Step to pattern;Forwards Number of Stairs: 5 General stair comments: cues for sequence  Wheelchair Mobility    Modified Rankin (Stroke Patients Only)       Balance Overall balance assessment: Mild deficits observed, not formally tested                                            Pertinent Vitals/Pain Pain Assessment: 0-10 Pain Score: 4  Pain Location: R knee Pain Descriptors / Indicators: Aching;Sore Pain Intervention(s): Limited activity within patient's tolerance;Monitored during session;Premedicated before session;Ice applied    Home Living Family/patient expects to be discharged to:: Private residence Living Arrangements: Spouse/significant other Available Help at Discharge: Family Type of Home: House Home Access: Stairs to enter Entrance Stairs-Rails: Right;Left;Can reach both Entrance Stairs-Number of Steps: 5 Home Layout: One level Home Equipment: [place] - single point;Walker - 2 wheels      Prior Function Level of Independence: Independent               Hand Dominance        Extremity/Trunk Assessment   Upper Extremity Assessment Upper Extremity Assessment: Overall WFL for tasks assessed    Lower Extremity Assessment Lower Extremity Assessment: RLE deficits/detail RLE Deficits / Details: 3/5 quads with IND SLR;  AAROM at knee -5 - 45    Cervical / Trunk Assessment Cervical / Trunk Assessment: Normal  Communication   Communication: No difficulties  Cognition Arousal/Alertness: Awake/alert Behavior During Therapy: WFL for tasks assessed/performed Overall Cognitive Status: Within Functional Limits for tasks assessed                                        General Comments      Exercises Total Joint Exercises  Ankle Circles/Pumps: AROM;Both;20 reps;Supine Quad Sets: AROM;Both;10 reps;Supine Heel Slides: AAROM;Right;15 reps;Supine Straight Leg Raises: AAROM;AROM;Right;15 reps;Supine Long Arc Quad: AAROM;AROM;Right;10 reps;Seated Knee Flexion: AROM;AAROM;Right;10 reps;Seated   Assessment/Plan    PT Assessment Patient needs continued PT services  PT Problem List Decreased strength;Decreased range of motion;Decreased activity tolerance;Decreased mobility;Decreased knowledge of use of DME;Pain       PT  Treatment Interventions DME instruction;Gait training;Stair training;Functional mobility training;Therapeutic activities;Therapeutic exercise;Patient/family education    PT Goals (Current goals can be found in the Care Plan section)  Acute Rehab PT Goals Patient Stated Goal: stairs without difficulty PT Goal Formulation: With patient Time For Goal Achievement: 06/12/19 Potential to Achieve Goals: Good    Frequency 7X/week   Barriers to discharge        Co-evaluation               AM-PAC PT "6 Clicks" Mobility  Outcome Measure Help needed turning from your back to your side while in a flat bed without using bedrails?: None Help needed moving from lying on your back to sitting on the side of a flat bed without using bedrails?: None Help needed moving to and from a bed to a chair (including a wheelchair)?: A Little Help needed standing up from a chair using your arms (e.g., wheelchair or bedside chair)?: A Little Help needed to walk in hospital room?: A Little Help needed climbing 3-5 steps with a railing? : A Little 6 Click Score: 20    End of Session Equipment Utilized During Treatment: Gait belt Activity Tolerance: Patient tolerated treatment well Patient left: with call bell/phone within reach;in bed Nurse Communication: Mobility status PT Visit Diagnosis: Difficulty in walking, not elsewhere classified (R26.2)    Time: [id number] PT Time Calculation (min) (ACUTE ONLY): 39 min   Charges:   PT Evaluation $PT Eval Low Complexity: 1 Low PT Treatments $Gait Training: 8-22 mins $Therapeutic Exercise: 8-22 mins        [place] Pager [phone] Office [phone]   Sherline Eberwein 06/05/2019, 12:14 PM

## 2019-07-31 ENCOUNTER — Other Ambulatory Visit: Payer: Self-pay | Admitting: Cardiovascular Disease

## 2019-08-13 ENCOUNTER — Other Ambulatory Visit: Payer: Self-pay

## 2019-08-13 NOTE — Progress Notes (Signed)
PCP - Dr. Carol Ada Cardiologist - Dr. Shelva Majestic  Chest x-ray - none EKG - 08/29/2018 Stress Test - none ECHO - 11/14/2017 Cardiac Cath -none 05/06/2018- CT Cardiac scoring   Sleep Study - none CPAP -   Fasting Blood Sugar - none Checks Blood Sugar _0____ times a day  Blood Thinner Instructions:none Aspirin Instructions:Aspirin 325 mg Last Dose:few days ago -but having closed manipulation  Anesthesia review:   Patient has a history of Left Breast Cancer, HTN, CLL(Chronic Lymphocytic leukemia)  Patient denies shortness of breath, fever, cough and chest pain at PAT appointment   Patient verbalized understanding of instructions that were given to them at the PAT appointment. Patient was also instructed that they will need to review over the PAT instructions again at home before surgery.

## 2019-08-14 ENCOUNTER — Other Ambulatory Visit (HOSPITAL_COMMUNITY)
Admission: RE | Admit: 2019-08-14 | Discharge: 2019-08-14 | Disposition: A | Discharge status: Home / Self Care | Payer: 59 | Source: Ambulatory Visit | Attending: Orthopedic Surgery | Admitting: Orthopedic Surgery

## 2019-08-14 DIAGNOSIS — Z20828 Contact with and (suspected) exposure to other viral communicable diseases: Secondary | ICD-10-CM | POA: Insufficient documentation

## 2019-08-14 DIAGNOSIS — Z01812 Encounter for preprocedural laboratory examination: Secondary | ICD-10-CM | POA: Diagnosis not present

## 2019-08-15 LAB — NOVEL CORONAVIRUS, NAA (HOSP ORDER, SEND-OUT TO REF LAB; TAT 18-24 HRS): SARS-CoV-2, NAA: NOT DETECTED

## 2019-08-16 ENCOUNTER — Other Ambulatory Visit: Payer: Self-pay | Admitting: Cardiovascular Disease

## 2019-08-16 DIAGNOSIS — I10 Essential (primary) hypertension: Secondary | ICD-10-CM

## 2019-08-16 DIAGNOSIS — Z79899 Other long term (current) drug therapy: Secondary | ICD-10-CM

## 2019-08-18 ENCOUNTER — Ambulatory Visit: Admit: 2019-08-18 | Payer: 59 | Admitting: Orthopedic Surgery

## 2019-08-18 SURGERY — MANIPULATION, KNEE, CLOSED
Anesthesia: Choice | Site: Knee | Laterality: Right

## 2019-09-17 ENCOUNTER — Other Ambulatory Visit: Payer: Self-pay

## 2019-09-17 ENCOUNTER — Ambulatory Visit (INDEPENDENT_AMBULATORY_CARE_PROVIDER_SITE_OTHER): Payer: 59 | Admitting: Cardiovascular Disease

## 2019-09-17 ENCOUNTER — Encounter: Payer: Self-pay | Admitting: Cardiovascular Disease

## 2019-09-17 VITALS — BP 112/78 | HR 77 | Ht 61.0 in | Wt 149.6 lb

## 2019-09-17 DIAGNOSIS — I10 Essential (primary) hypertension: Secondary | ICD-10-CM | POA: Diagnosis not present

## 2019-09-17 DIAGNOSIS — R002 Palpitations: Secondary | ICD-10-CM

## 2019-09-17 DIAGNOSIS — E785 Hyperlipidemia, unspecified: Secondary | ICD-10-CM

## 2019-09-17 DIAGNOSIS — Z8249 Family history of ischemic heart disease and other diseases of the circulatory system: Secondary | ICD-10-CM | POA: Diagnosis not present

## 2019-09-17 DIAGNOSIS — C9110 Chronic lymphocytic leukemia of B-cell type not having achieved remission: Secondary | ICD-10-CM

## 2019-09-17 DIAGNOSIS — Z86000 Personal history of in-situ neoplasm of breast: Secondary | ICD-10-CM

## 2019-09-17 NOTE — Patient Instructions (Signed)
Medication Instructions:  Your physician recommends that you continue on your current medications as directed. Please refer to the Current Medication list given to you today.  *If you need a refill on your cardiac medications before your next appointment, please call your pharmacy*  Lab Work: none If you have labs (blood work) drawn today and your tests are completely normal, you will receive your results only by: Marland Kitchen MyChart Message (if you have MyChart) OR . A paper copy in the mail If you have any lab test that is abnormal or we need to change your treatment, we will call you to review the results.  Testing/Procedures: none  Follow-Up: At [facility], you and your health needs are our priority.  As part of our continuing mission to provide you with exceptional heart care, we have created designated Provider Care Teams.  These Care Teams include your primary Cardiologist (physician) and Advanced Practice Providers (APPs -  Physician Assistants and Nurse Practitioners) who all work together to provide you with the care you need, when you need it.  Your next appointment:   12 month(s)  The format for your next appointment:   Either In Person or Virtual  Provider:   Shelva Majestic, MD

## 2019-09-17 NOTE — Progress Notes (Signed)
Patient ID: Colleen Shannon, female   DOB: 12-May-1956, 63 y.o.   MRN: [id number]     HPI: Colleen Shannon is a 63 y.o. female presents to the office today for a 65 month cardiology evaluation.  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  Colleen Shannon has a history of hypertension and previous chest pain for which he was started on carvedilol and initially nifedipine by Dr. Elisabeth Cara with a presumptive dose of possible spasm. In August 2013 a nuclear perfusion study was entirely normal. 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 She has been on lipid-lowering therapy with atorvastatin. When I saw her 2 years ago I discontinued her nifedipine and switched her to amlodipine. She states her blood pressure has been controlled on this regimen. An echo Doppler study in August 2013 showed normal systolic function. She did have mild mitral regurgitation.  Over the past year, she has done well from a cardiac standpoint.  She denies any episodes of chest pain.  He denies palpitations.  She is now followed by Dr. Carol Ada for primary care.  Recent laboratory had been drawn for which she was told was excellent.  Colleen Shannon underwent right knee replacement surgery by Dr. Sandie Ano on 05/11/2014.  She tolerated that well from a cardiovascular standpoint.  She underwent knee replacement to her left knee in November 2017.  She also was diagnosed as having CLL.  She is followed by Dr. Lindi Adie. .  She continues to take tamoxifen for previous carcinoma in situ on breast biopsy.   I last saw her in January 2019 at which time she had noticed palpitations which may occur 4-5 days per week.  These typically are isolated and last for approximately 20 seconds.  She denies any sustained episodes of tachycardia.  She denies chest pain.  I recommended slight titration of her current date of  carvedilol up to 9.375 mg twice a day.  Her blood pressure was stable and she was advised to continue her current dose of amlodipine.    Follow-up echo Doppler study in February 2019 which showed an EF of 55 to 60% with grade 1 diastolic dysfunction.  There was mild mitral regurgitation.  Pulmonary pressures were normal.    She was seen in April 2019 by Almyra Deforest, PA and had noted that her baseline heart rate had been increasing to the 80s.  She was drinking 1 cup of coffee per day.  Carvedilol was further increased to 12.5 mg twice a day.  When I last saw her in July 2019 she denied any episodes of chest pain but was concerned since her younger brother had suffered an MI and had required insertion of for coronary stents.  During that evaluation she was inquiring about screening for CAD.   She underwent a CT cardiac scoring study which showed a coronary calcium score of 0.  Her chest CT did show diffuse bronchial wall thickening.  She states as result of her CLL she does have episodes of bronchitis frequently.  I last saw her in November 2019 unfortunately her brother died at age 82 and was ultimately found to have a pituitary adenoma with development of Cushing's disease and 12 days following his resection of his adenoma he apparently had a bleed requiring mechanical ventilation and ultimately developed multisystem organ failure leading to his death.  This has been  stressful for Colleen Shannon.  She denied any chest pain.   Over the past year, she has had difficulty with her right knee.  She had previously undergone bilateral knee replacements.  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  She is continuing to be followed by Dr. Quintella Reichert for her left breast cancer originally diagnosed in 2015 and currently on tamoxifen as well as her CLL with most recent white  blood count 39.9 thousand which has been fairly stable.  She presents for evaluation.  Past Medical History:  Diagnosis Date  . Arthritis   . Cancer (HCC)    hx of skin cancer   . CLL (chronic lymphocytic leukemia) ([place])   . Elevated cholesterol 05/22/12   Nuc stress test-Normal. The patiet had a markedly positive GXT for ischemia. post stress EF 72%  . Endometrial polyp   . GERD (gastroesophageal reflux disease)   . Grade I diastolic dysfunction    noted on echo  . Hypertension 05/22/12   ECHO-EF >55% trace tricupsid regurgitation. There is mild mitral regurgitation.   . MR (mitral regurgitation)    mild noted on echo  . Plantar fasciitis of left foot   . Pneumonia    history of  . PONV (postoperative nausea and vomiting)     Past Surgical History:  Procedure Laterality Date  . ABDOMINAL SURGERY     Abdominoplasty  . CESAREAN SECTION     X 2  . CHOLECYSTECTOMY    . COLONSCOPY    . DILATION AND CURETTAGE OF UTERUS    . FOOT SURGERY    . HYSTEROSCOPY    . KNEE ARTHROTOMY Right 06/04/2019   Procedure: Right knee arthrotomy; scar excision;  Surgeon: [name], MD;  Location: [facility];  Service: Orthopedics;  Laterality: Right;  67mn  . KNEE CLOSED REDUCTION Right 07/20/2014   Procedure: RIGHT KNEE CLOSED MANIPULATION;  Surgeon: FGearlean Alf MD;  Location: [facility];  Service: Orthopedics;  Laterality: Right;  . KNEE SURGERY     arthroscopic  . PARTIAL KNEE ARTHROPLASTY Left   . RADIOACTIVE SEED GUIDED EXCISIONAL BREAST BIOPSY N/A 10/06/2014   Procedure: RADIOACTIVE SEED GUIDED EXCISIONAL BREAST BIOPSY;  Surgeon: MRolm Bookbinder MD;  Location: [facility]  Service: General;  Laterality: N/A;  . REFRACTIVE SURGERY    . TOTAL KNEE ARTHROPLASTY Right 05/11/2014   Procedure: RIGHT TOTAL KNEE ARTHROPLASTY;  Surgeon: FGearlean Alf MD;  Location: [facility];  Service: Orthopedics;  Laterality: Right;  . TUBAL LIGATION      No Known Allergies  Current  Outpatient Medications  Medication Sig Dispense Refill  . acetaminophen (TYLENOL) 325 MG tablet Take 650 mg by mouth once as needed for mild pain or headache.    .Marland KitchenamLODipine (NORVASC) 5 MG tablet Take 1 tablet (5 mg total) by mouth at bedtime. 90 tablet 0  . Ascorbic Acid (VITAMIN C) 1000 MG tablet Take 1,000 mg by mouth daily.    .Marland Kitchenaspirin EC 325 MG EC tablet Take 1 tablet (325 mg total) by mouth daily. (Patient taking differently: Take 81 mg by mouth every other day. ) 40 tablet 0  . atorvastatin (LIPITOR) 20 MG tablet TAKE 1 TABLET BY MOUTH  DAILY (Patient taking differently: Take 20 mg by mouth at bedtime. ) 90 tablet 3  . Calcium Carb-Cholecalciferol (CALCIUM-VITAMIN D3) 600-400 MG-UNIT TABS  Take 1 tablet by mouth 2 (two) times daily.    . calcium carbonate (TUMS - DOSED IN MG ELEMENTAL CALCIUM) 500 MG chewable tablet Chew 2 tablets by mouth daily as needed for indigestion or heartburn.    . carvedilol (COREG) 12.5 MG tablet TAKE 1 TABLET BY MOUTH  TWICE A DAY WITH A MEAL ,  CAN TAKE 1/2 TABLET AS  NEEDED FOR PALPITATIONS (Patient taking differently: Take 12.5 mg by mouth 2 (two) times daily with a meal. ) 180 tablet 2  . Cholecalciferol (DIALYVITE VITAMIN D 5000) 125 MCG (5000 UT) capsule Take 1,000 Units by mouth 2 (two) times daily.     Marland Kitchen ezetimibe (ZETIA) 10 MG tablet TAKE 1 TABLET BY MOUTH AT  BEDTIME 90 tablet 3  . HYDROcodone-acetaminophen (NORCO/VICODIN) 5-325 MG tablet Take 1-2 tablets by mouth every 4 (four) hours as needed for moderate pain (pain score 4-6). 56 tablet 0  . methocarbamol (ROBAXIN) 500 MG tablet Take 1 tablet (500 mg total) by mouth every 6 (six) hours as needed for muscle spasms. 40 tablet 0  . Multiple Vitamin (MULTIVITAMIN WITH MINERALS) TABS tablet Take 1 tablet by mouth daily.    . Omega-3 Fatty Acids (FISH OIL) 1000 MG CAPS Take 1,000 mg by mouth daily. 120 mg-180 mg    . omeprazole (PRILOSEC) 40 MG capsule Take 40 mg by mouth daily.    . Simethicone (PHAZYME)  180 MG CAPS Take 180 mg by mouth at bedtime.     . tamoxifen (NOLVADEX) 20 MG tablet Take 0.5 tablets (10 mg total) by mouth daily. (Patient taking differently: Take 5 mg by mouth daily. ) 45 tablet 3  . TAMOXIFEN CITRATE PO Take 5 mg by mouth daily.    . Turmeric 500 MG TABS Take 1,000 mg by mouth daily.      No current facility-administered medications for this visit.     Social History   Socioeconomic History  . Marital status: Married    Spouse name: Not on file  . Number of children: Not on file  . Years of education: Not on file  . Highest education level: Not on file  Occupational History  . Not on file  Social Needs  . Financial resource strain: Not on file  . Food insecurity    Worry: Not on file    Inability: Not on file  . Transportation needs    Medical: Not on file    Non-medical: Not on file  Tobacco Use  . Smoking status: Never Smoker  . Smokeless tobacco: Never Used  Substance and Sexual Activity  . Alcohol use: Yes    Alcohol/week: 1.0 standard drinks    Types: 1 Standard drinks or equivalent per week    Comment: occasional   . Drug use: No  . Sexual activity: Yes    Birth control/protection: Surgical, Post-menopausal  Lifestyle  . Physical activity    Days per week: Not on file    Minutes per session: Not on file  . Stress: Not on file  Relationships  . Social Herbalist on phone: Not on file    Gets together: Not on file    Attends religious service: Not on file    Active member of club or organization: Not on file    Attends meetings of clubs or organizations: Not on file    Relationship status: Not on file  . Intimate partner violence    Fear of current or ex partner: Not on  file    Emotionally abused: Not on file    Physically abused: Not on file    Forced sexual activity: Not on file  Other Topics Concern  . Not on file  Social History Narrative  . Not on file   Additional social history is notable that she is married for 35  years. 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 There is no tobacco use. He does drink occasional wine. .  She is status post bilateral knee replacements.   Family History  Problem Relation Age of Onset  . Heart disease Father   . Aneurysm Mother   . Hyperlipidemia Mother   . Hypertension Brother   . Cancer - Other Paternal Grandmother        uterine  . Hypertension Paternal Grandfather   . Stroke Paternal Grandfather   . Heart disease Maternal Grandfather   . Cancer - Other Maternal Grandfather        lung   ROS General: Negative; No fevers, chills, or night sweats;  HEENT: Negative; No changes in vision or hearing, sinus congestion, difficulty swallowing Pulmonary: Negative; No cough, wheezing, shortness of breath, hemoptysis Cardiovascular: Negative; No chest pain, presyncope, syncope, palpitations GI: Negative; No nausea, vomiting, diarrhea, or abdominal pain GU: Negative; No dysuria, hematuria, or difficulty voiding Musculoskeletal: Positive for bilateral knee replacement surgery;positive for osteoarthritis no myalgias, Hematologic/Oncology: positive for CLL Endocrine: Negative; no heat/cold intolerance; no diabetes Neuro: Negative; no changes in balance, headaches Skin: Negative; No rashes or skin lesions Psychiatric: Negative; No behavioral problems, depression Sleep: Negative; No snoring, daytime sleepiness, hypersomnolence, bruxism, restless legs, hypnogognic hallucinations, no cataplexy Other comprehensive 14 point system review is negative.  PE BP 112/78   Pulse 77   Ht 5' 1"  (1.549 m)   Wt 149 lb 9.6 oz (67.9 kg)   LMP 11/19/2011   BMI 28.27 kg/m    Repeat blood pressure by me was 126/76  Wt Readings from Last 3 Encounters:  09/17/19 149 lb 9.6 oz (67.9 kg)  06/04/19 149 lb 14.6 oz (68 kg)  05/29/19 150 lb (68 kg)   General: Alert, oriented, no distress.  Skin: normal turgor, no rashes, warm and  dry HEENT: Normocephalic, atraumatic. Pupils equal round and reactive to light; sclera anicteric; extraocular muscles intact;  Nose without nasal septal hypertrophy Mouth/Parynx benign; Mallinpatti scale 2 Neck: No JVD, no carotid bruits; normal carotid upstroke Lungs: clear to ausculatation and percussion; no wheezing or rales Chest wall: without tenderness to palpitation Heart: PMI not displaced, RRR, s1 s2 normal, 1/6 systolic murmur, no diastolic murmur, no rubs, gallops, thrills, or heaves Abdomen: soft, nontender; no hepatosplenomehaly, BS+; abdominal aorta nontender and not dilated by palpation. Back: no CVA tenderness Pulses 2+ Musculoskeletal: full range of motion, normal strength, no joint deformities Extremities: no clubbing cyanosis or edema, Homan's sign negative  Neurologic: grossly nonfocal; Cranial nerves grossly wnl Psychologic: Normal mood and affect   ECG (independently read by me): Normal sinus rhythm at 77 bpm.  No ST segment changes.  No ectopy.  Normal intervals.  November 2019 ECG (independently read by me): Normal sinus rhythm at 69 bpm.  No ectopy.  Normal intervals.  July 2019 ECG (independently read by me): Normal sinus rhythm at 64 bpm.  No ectopy.  Normal intervals.  No ST segment changes.  January 2019 ECG (independently read by me): normal sinus rhythm at 76 bpm.  Normal intervals.  No ectopy or  ST changes  November 2016 ECG (independently read by me) from 07/17/2014: Normal sinus rhythm.  No ectopy.  Normal intervals.  No ST segment changes.  August 2014 ECG: Normal sinus rhythm at 75 beats per minute without ectopy. Normal intervals.  LABS: BMP Latest Ref Rng & Units 05/29/2019 11/28/2018 07/15/2018  Glucose 70 - 99 mg/dL 120(H) 140(H) 101(H)  BUN 8 - 23 mg/dL 16 14 15   Creatinine 0.44 - 1.00 mg/dL 0.80 0.82 0.74  BUN/Creat Ratio 12 - 28 - - 20  Sodium 135 - 145 mmol/L 141 142 143  Potassium 3.5 - 5.1 mmol/L 4.3 4.0 4.8  Chloride 98 - 111 mmol/L 106  106 103  CO2 22 - 32 mmol/L 25 27 25   Calcium 8.9 - 10.3 mg/dL 8.8(L) 8.8(L) 9.2   Hepatic Function Latest Ref Rng & Units 05/29/2019 11/28/2018 07/15/2018  Total Protein 6.5 - 8.1 g/dL 6.3(L) 6.1(L) 6.0  Albumin 3.5 - 5.0 g/dL 3.9 3.7 4.4  AST 15 - 41 U/L 26 21 19   ALT 0 - 44 U/L 23 21 18   Alk Phosphatase 38 - 126 U/L 34(L) 39 35(L)  Total Bilirubin 0.3 - 1.2 mg/dL 0.3 0.4 0.4   CBC Latest Ref Rng & Units 05/29/2019 11/28/2018 05/21/2018  WBC 4.0 - 10.5 K/uL 39.9(H) 48.4(H) 35.6(H)  Hemoglobin 12.0 - 15.0 g/dL 12.2 11.9(L) 12.7  Hematocrit 36.0 - 46.0 % 38.3 37.7 39.4  Platelets 150 - 400 K/uL 144(L) 145(L) 152   Lab Results  Component Value Date   MCV 99.0 05/29/2019   MCV 99.0 11/28/2018   MCV 96.6 05/21/2018   Lab Results  Component Value Date   TSH 1.860 01/30/2018   Lipid Panel     Component Value Date/Time   CHOL 131 07/15/2018 0830   TRIG 84 07/15/2018 0830   HDL 56 07/15/2018 0830   CHOLHDL 2.3 07/15/2018 0830   CHOLHDL 2.3 08/19/2015 0857   VLDL 17 08/19/2015 0857   LDLCALC 58 07/15/2018 0830    RADIOLOGY: No results found.  IMPRESSION: 1. Essential hypertension   2. Hyperlipidemia with target LDL less than 70   3. Palpitations   4. Family history of ischemic heart disease   5. History of carcinoma in situ of breast   6. CLL (chronic lymphocytic leukemia) ([place])     ASSESSMENT AND PLAN: Colleen Shannon has a history of hypertension, and remotely had experienced chest pain.  In August 2013 a nuclear perfusion study was normal.   When I saw her in January 2019, her blood pressure was mildly elevated based on recent change in guidelines and she was experiencing some episodic palpitations.  Her palpitations improved with slight titration of carvedilol and when seen several months later her dose was further titrated to 12.5 mg twice a day.  At present, her blood pressure today is stable on amlodipine 5 mg and carvedilol 12.5 mg twice a day.  She has not been aware  of any recurrent arrhythmia but has been instructed that she can take an extra 1/2 tablet as needed for palpitations.  She continues to be on atorvastatin 20 mg and Zetia 10 mg for hyperlipidemia.  In the past she had developed some myalgias on higher dose atorvastatin.  She is status post bilateral knee replacements and recently has had recurrent issues with arthrofibrosis of her right knee requiring 2 additional procedures in August and most recently November 2020.  She takes omeprazole for GERD.  She tells me she may be able to discontinue  tamoxifen in February 2021 with reference to her left breast carcinoma diagnosed in 2015.  Her white blood count remains elevated but stable with reference to her currently untreated CLL followed closely by Dr. Sonny Dandy.  Her dose of aspirin was increased to 325 mg following her most recent knee procedure.  I recommended reducing this back down to 81 mg which she has done already.  As long as she remains stable I will see her in 1 year for reevaluation   Time spent: 25 minutes Troy Sine, MD, [facility]  09/17/2019 3:16 PM

## 2019-10-15 ENCOUNTER — Encounter: Payer: Self-pay | Admitting: Hematology and Oncology

## 2019-10-19 ENCOUNTER — Other Ambulatory Visit: Payer: Self-pay | Admitting: Cardiovascular Disease

## 2019-10-22 ENCOUNTER — Ambulatory Visit: Payer: 59 | Attending: Internal Medicine

## 2019-10-22 DIAGNOSIS — Z20822 Contact with and (suspected) exposure to covid-19: Secondary | ICD-10-CM

## 2019-10-23 LAB — NOVEL CORONAVIRUS, NAA: SARS-CoV-2, NAA: NOT DETECTED

## 2019-11-05 ENCOUNTER — Other Ambulatory Visit: Payer: Self-pay | Admitting: Cardiovascular Disease

## 2019-11-21 ENCOUNTER — Ambulatory Visit: Payer: 59

## 2019-11-21 ENCOUNTER — Ambulatory Visit: Payer: 59 | Attending: Internal Medicine

## 2019-11-21 DIAGNOSIS — Z23 Encounter for immunization: Secondary | ICD-10-CM | POA: Insufficient documentation

## 2019-11-21 NOTE — Progress Notes (Signed)
   Covid-19 Vaccination Clinic  Name:  Colleen Shannon    MRN: [id number] DOB: Jan 17, 1956  11/21/2019  Colleen Shannon was observed post Covid-19 immunization for 15 minutes without incidence. She was provided with Vaccine Information Sheet and instruction to access the V-Safe system.   Colleen Shannon was instructed to call 911 with any severe reactions post vaccine: Marland Kitchen Difficulty breathing  . Swelling of your face and throat  . A fast heartbeat  . A bad rash all over your body  . Dizziness and weakness    Immunizations Administered    Name Date Dose VIS Date Route   Pfizer COVID-19 Vaccine 11/21/2019  3:12 PM 0.3 mL 09/19/2019 Intramuscular   Manufacturer: [place]   Lot: [id number]   [place]: [id number]

## 2019-11-30 NOTE — Progress Notes (Signed)
Patient Care Team: Carol Ada, MD as PCP - General (Family Medicine) Troy Sine, MD as PCP - Cardiology (Cardiology)  DIAGNOSIS:    ICD-10-CM   1. Chronic lymphocytic leukemia of B-cell type not having achieved remission (HCC)  C91.10   2. Neoplasm of left breast, primary tumor staging category Tis: lobular carcinoma in situ (LCIS)  D05.02     SUMMARY OF ONCOLOGIC HISTORY: Oncology History  Chronic lymphocytic leukemia (CLL), B-cell ([place])  02/08/2016 Initial Diagnosis   Chronic lymphocytic leukemia (CLL), B-cell (HCC)     CHIEF COMPLIANT: Follow-up of left breast LCIS on tamoxifen and CLL  INTERVAL HISTORY: Colleen Shannon is a 64 y.o. with above-mentioned history of CLL with cervical lymphadenopathy and lymphocytosis who is currently on surveillance. She also has a history of LCIS of the left breast and is currently on tamoxifen therapy 10mg  daily.She presents to the clinic today for follow-up.   ALLERGIES:  has No Known Allergies.  MEDICATIONS:  Current Outpatient Medications  Medication Sig Dispense Refill  . acetaminophen (TYLENOL) 325 MG tablet Take 650 mg by mouth once as needed for mild pain or headache.    Marland Kitchen amLODipine (NORVASC) 5 MG tablet TAKE 1 TABLET BY MOUTH AT  BEDTIME 90 tablet 3  . Ascorbic Acid (VITAMIN C) 1000 MG tablet Take 1,000 mg by mouth daily.    Marland Kitchen aspirin EC 325 MG EC tablet Take 1 tablet (325 mg total) by mouth daily. (Patient taking differently: Take 81 mg by mouth every other day. ) 40 tablet 0  . atorvastatin (LIPITOR) 20 MG tablet TAKE 1 TABLET BY MOUTH  DAILY (Patient taking differently: Take 20 mg by mouth at bedtime. ) 90 tablet 3  . Calcium Carb-Cholecalciferol (CALCIUM-VITAMIN D3) 600-400 MG-UNIT TABS Take 1 tablet by mouth 2 (two) times daily.    . calcium carbonate (TUMS - DOSED IN MG ELEMENTAL CALCIUM) 500 MG chewable tablet Chew 2 tablets by mouth daily as needed for indigestion or heartburn.    . carvedilol (COREG) 12.5 MG tablet  TAKE 1 TABLET BY MOUTH  TWICE A DAY WITH A MEAL ,  CAN TAKE 1/2 TABLET AS  NEEDED FOR PALPITATIONS 180 tablet 3  . Cholecalciferol (DIALYVITE VITAMIN D 5000) 125 MCG (5000 UT) capsule Take 1,000 Units by mouth 2 (two) times daily.     Marland Kitchen ezetimibe (ZETIA) 10 MG tablet TAKE 1 TABLET BY MOUTH AT  BEDTIME 90 tablet 3  . HYDROcodone-acetaminophen (NORCO/VICODIN) 5-325 MG tablet Take 1-2 tablets by mouth every 4 (four) hours as needed for moderate pain (pain score 4-6). 56 tablet 0  . methocarbamol (ROBAXIN) 500 MG tablet Take 1 tablet (500 mg total) by mouth every 6 (six) hours as needed for muscle spasms. 40 tablet 0  . Multiple Vitamin (MULTIVITAMIN WITH MINERALS) TABS tablet Take 1 tablet by mouth daily.    . Omega-3 Fatty Acids (FISH OIL) 1000 MG CAPS Take 1,000 mg by mouth daily. 120 mg-180 mg    . omeprazole (PRILOSEC) 40 MG capsule Take 40 mg by mouth daily.    . Simethicone (PHAZYME) 180 MG CAPS Take 180 mg by mouth at bedtime.     . tamoxifen (NOLVADEX) 20 MG tablet Take 0.5 tablets (10 mg total) by mouth daily. (Patient taking differently: Take 5 mg by mouth daily. ) 45 tablet 3  . TAMOXIFEN CITRATE PO Take 5 mg by mouth daily.    . Turmeric 500 MG TABS Take 1,000 mg by mouth daily.  No current facility-administered medications for this visit.    PHYSICAL EXAMINATION: ECOG PERFORMANCE STATUS: 1 - Symptomatic but completely ambulatory  Vitals:   12/01/19 0819  BP: (!) 129/57  Pulse: 74  Resp: 17  Temp: 97.8 F (36.6 C)  SpO2: 100%   Filed Weights   12/01/19 0819  Weight: 147 lb (66.7 kg)    BREAST: No palpable masses or nodules in either right or left breasts. No palpable axillary supraclavicular or infraclavicular adenopathy no breast tenderness or nipple discharge. (exam performed in the presence of a chaperone)  LABORATORY DATA:  I have reviewed the data as listed CMP Latest Ref Rng & Units 05/29/2019 11/28/2018 07/15/2018  Glucose 70 - 99 mg/dL 120(H) 140(H) 101(H)    BUN 8 - 23 mg/dL 16 14 15   Creatinine 0.44 - 1.00 mg/dL 0.80 0.82 0.74  Sodium 135 - 145 mmol/L 141 142 143  Potassium 3.5 - 5.1 mmol/L 4.3 4.0 4.8  Chloride 98 - 111 mmol/L 106 106 103  CO2 22 - 32 mmol/L 25 27 25   Calcium 8.9 - 10.3 mg/dL 8.8(L) 8.8(L) 9.2  Total Protein 6.5 - 8.1 g/dL 6.3(L) 6.1(L) 6.0  Total Bilirubin 0.3 - 1.2 mg/dL 0.3 0.4 0.4  Alkaline Phos 38 - 126 U/L 34(L) 39 35(L)  AST 15 - 41 U/L 26 21 19   ALT 0 - 44 U/L 23 21 18     Lab Results  Component Value Date   WBC 43.4 (H) 12/01/2019   HGB 12.2 12/01/2019   HCT 38.5 12/01/2019   MCV 98.2 12/01/2019   PLT 145 (L) 12/01/2019   NEUTROABS PENDING 12/01/2019    ASSESSMENT & PLAN:  Chronic lymphocytic leukemia (CLL), B-cell (HCC) CLL CD5 and CD23 positive Stage I(small cervical lymph nodes)  Lab review:05/29/2019 WBC 39.9, platelets 144, hemoglobin 12.2 Patient has bilateral cervical lymphadenopathy in bilateral axilla lymphadenopathy as well.  No hepatosplenomegaly. She does not have any B symptoms. Prognosis of CLL: FISH panel revealed + 12 and -13 Q Which are indicative of favorable prognosis. No indications for treatment. The lymph nodes in the neck appear to be waxing and waning. She received her first dose of COVID-19 vaccine.  Neoplasm of left breast, primary tumor staging category Tis: lobular carcinoma in situ (LCIS) Left breast LCIS status post lumpectomy 10/06/2014; Started tamoxifen for breast cancer risk reduction 20 mg daily 11/18/2014 ,decrease to 10 mg on 11/22/2017 Patient completed 5 years of tamoxifen and will discontinue it at this time.  Tamoxifen toxicities:  1. Hot flashes 6-8 per day :Patient is able to handle the 2.Patient complains of diffuse myalgias and stiffness: Stable. Could be related to Lipitor.  Breast cancer surveillance:  Mammogram and ultrasound 05/20/2019 at [facility]: Benign breast density category B    No orders of the defined types were placed in this  encounter.  The patient has a good understanding of the overall plan. she agrees with it. she will call with any problems that may develop before the next visit here.  Total time spent: 20 mins including face to face time and time spent for planning, charting and coordination of care  Heath Lark, MD 12/01/2019  I, Cloyde Reams Dorshimer, am acting as scribe for Dr. Nicholas Lose.  I have reviewed the above documentation for accuracy and completeness, and I agree with the above.

## 2019-12-01 ENCOUNTER — Ambulatory Visit: Payer: 59 | Admitting: Hematology and Oncology

## 2019-12-01 ENCOUNTER — Inpatient Hospital Stay: Payer: 59 | Attending: Hematology and Oncology

## 2019-12-01 ENCOUNTER — Other Ambulatory Visit: Payer: Self-pay

## 2019-12-01 ENCOUNTER — Inpatient Hospital Stay (HOSPITAL_BASED_OUTPATIENT_CLINIC_OR_DEPARTMENT_OTHER): Payer: 59 | Admitting: Hematology and Oncology

## 2019-12-01 DIAGNOSIS — C9110 Chronic lymphocytic leukemia of B-cell type not having achieved remission: Secondary | ICD-10-CM | POA: Diagnosis not present

## 2019-12-01 DIAGNOSIS — Z79810 Long term (current) use of selective estrogen receptor modulators (SERMs): Secondary | ICD-10-CM | POA: Diagnosis not present

## 2019-12-01 DIAGNOSIS — D0502 Lobular carcinoma in situ of left breast: Secondary | ICD-10-CM | POA: Diagnosis not present

## 2019-12-01 LAB — CMP (CANCER CENTER ONLY)
ALT: 21 U/L (ref 0–44)
AST: 23 U/L (ref 15–41)
Albumin: 4 g/dL (ref 3.5–5.0)
Alkaline Phosphatase: 37 U/L — ABNORMAL LOW (ref 38–126)
Anion gap: 9 (ref 5–15)
BUN: 15 mg/dL (ref 8–23)
CO2: 27 mmol/L (ref 22–32)
Calcium: 8.9 mg/dL (ref 8.9–10.3)
Chloride: 109 mmol/L (ref 98–111)
Creatinine: 0.84 mg/dL (ref 0.44–1.00)
GFR, Est AFR Am: >60 mL/min (ref >60)
GFR, Estimated: >60 mL/min (ref >60)
Glucose, Bld: 135 mg/dL — ABNORMAL HIGH (ref 70–99)
Potassium: 4.4 mmol/L (ref 3.5–5.1)
Sodium: 145 mmol/L (ref 135–145)
Total Bilirubin: 0.4 mg/dL (ref 0.3–1.2)
Total Protein: 6.1 g/dL — ABNORMAL LOW (ref 6.5–8.1)

## 2019-12-01 LAB — CBC WITH DIFFERENTIAL (CANCER CENTER ONLY)
Abs Immature Granulocytes: 0.06 10*3/uL (ref 0.00–0.07)
Basophils Absolute: 0.2 10*3/uL — ABNORMAL HIGH (ref 0.0–0.1)
Basophils Relative: 0 %
Eosinophils Absolute: 0.2 10*3/uL (ref 0.0–0.5)
Eosinophils Relative: 1 %
HCT: 38.5 % (ref 36.0–46.0)
Hemoglobin: 12.2 g/dL (ref 12.0–15.0)
Immature Granulocytes: 0 %
Lymphocytes Relative: 86 %
Lymphs Abs: 37.2 10*3/uL — ABNORMAL HIGH (ref 0.7–4.0)
MCH: 31.1 pg (ref 26.0–34.0)
MCHC: 31.7 g/dL (ref 30.0–36.0)
MCV: 98.2 fL (ref 80.0–100.0)
Monocytes Absolute: 3 10*3/uL — ABNORMAL HIGH (ref 0.1–1.0)
Monocytes Relative: 7 %
Neutro Abs: 2.8 10*3/uL (ref 1.7–7.7)
Neutrophils Relative %: 6 %
Platelet Count: 145 10*3/uL — ABNORMAL LOW (ref 150–400)
RBC: 3.92 MIL/uL (ref 3.87–5.11)
RDW: 13.3 % (ref 11.5–15.5)
WBC Count: 43.4 10*3/uL — ABNORMAL HIGH (ref 4.0–10.5)
nRBC: 0 % (ref 0.0–0.2)

## 2019-12-01 LAB — LACTATE DEHYDROGENASE: LDH: 285 U/L — ABNORMAL HIGH (ref 98–192)

## 2019-12-01 NOTE — Assessment & Plan Note (Signed)
Left breast LCIS status post lumpectomy 10/06/2014; Started tamoxifen for breast cancer risk reduction 20 mg daily 11/18/2014 ,decrease to 10 mg on 2/14/2019plan is to treat for 5 years which will be completed in February 2021  Tamoxifen toxicities:  1. Hot flashes 6-8 per day :Patient is able to handle the 2.Patient complains of diffuse myalgias and stiffness: Stable. Could be related to Lipitor.  Breast cancer surveillance:  Mammogram and ultrasound 05/20/2019 at [facility]: Benign breast density category B

## 2019-12-01 NOTE — Assessment & Plan Note (Signed)
CLL CD5 and CD23 positive Stage I(small cervical lymph nodes)  Lab review:05/29/2019 WBC 39.9, platelets 144, hemoglobin 12.2 Patient has bilateral cervical lymphadenopathy in bilateral axilla lymphadenopathy as well.  No hepatosplenomegaly. She does not have any B symptoms. Prognosis of CLL: FISH panel revealed + 12 and -13 Q Which are indicative of favorable prognosis. No indications for treatment. Return to clinic in 6 months with labs and follow-up.

## 2019-12-14 ENCOUNTER — Ambulatory Visit: Payer: 59 | Attending: Internal Medicine

## 2019-12-14 DIAGNOSIS — Z23 Encounter for immunization: Secondary | ICD-10-CM | POA: Insufficient documentation

## 2019-12-14 NOTE — Progress Notes (Signed)
   Covid-19 Vaccination Clinic  Name:  Colleen Shannon    MRN: [id number] DOB: February 03, 1956  12/14/2019  [name] was observed post Covid-19 immunization for 15 minutes without incident. She was provided with Vaccine Information Sheet and instruction to access the V-Safe system.   [name] was instructed to call 911 with any severe reactions post vaccine: Marland Kitchen Difficulty breathing  . Swelling of face and throat  . A fast heartbeat  . A bad rash all over body  . Dizziness and weakness   Immunizations Administered    Name Date Dose VIS Date Route   Pfizer COVID-19 Vaccine 12/14/2019 10:01 AM 0.3 mL 09/19/2019 Intramuscular   Manufacturer: [place]   Lot: [id number]   [place]: [id number]

## 2020-01-25 ENCOUNTER — Other Ambulatory Visit: Payer: Self-pay | Admitting: Cardiovascular Disease

## 2020-01-25 DIAGNOSIS — I10 Essential (primary) hypertension: Secondary | ICD-10-CM

## 2020-01-25 DIAGNOSIS — Z79899 Other long term (current) drug therapy: Secondary | ICD-10-CM

## 2020-02-09 DIAGNOSIS — M25571 Pain in right ankle and joints of right foot: Secondary | ICD-10-CM | POA: Insufficient documentation

## 2020-02-09 NOTE — Progress Notes (Signed)
Office Visit Note  Patient: Colleen Shannon             Date of Birth: [birth year]           MRN: [id number]             PCP: Carol Ada, MD Referring: Carol Ada, MD Visit Date: 02/18/2020 Occupation: @GUAROCC @  Subjective:  Pain in multiple joints.   History of Present Illness: Colleen Shannon is a 64 y.o. female seen in consultation per request of her PCP.  According to the patient about 5 to 6 years ago she started having some joint pain and stiffness.  She denies any joint swelling.  There is family history of osteoarthritis in her mother.  She has noticed pain and stiffness in her hands, knee joints, ankles and her feet.  She had right total knee replacement in 2015 and left partial knee replacement in 2017 by Dr. Maureen Ralphs.  She has been having some discomfort in her right ankle and an MRI of her right ankle is scheduled to rule out a stress fracture.  She also has some discomfort in her feet.  Activities of Daily Living:  Patient reports morning stiffness for 1 hour.   Patient Reports nocturnal pain.  Difficulty dressing/grooming: Denies Difficulty climbing stairs: Denies Difficulty getting out of chair: Denies Difficulty using hands for taps, buttons, cutlery, and/or writing: Denies  Review of Systems  Constitutional: Negative for fatigue, night sweats, weight gain and weight loss.  HENT: Negative for mouth sores, trouble swallowing, trouble swallowing, mouth dryness and nose dryness.   Eyes: Negative for pain, redness, itching, visual disturbance and dryness.  Respiratory: Negative for cough, shortness of breath and difficulty breathing.   Cardiovascular: Negative for chest pain, palpitations, hypertension, irregular heartbeat and swelling in legs/feet.  Gastrointestinal: Negative for blood in stool, constipation and diarrhea.  Endocrine: Negative for increased urination.  Genitourinary: Negative for difficulty urinating, painful urination and vaginal dryness.    Musculoskeletal: Positive for arthralgias, joint pain and morning stiffness. Negative for joint swelling, myalgias, muscle weakness, muscle tenderness and myalgias.  Skin: Negative for color change, rash, hair loss, redness, skin tightness, ulcers and sensitivity to sunlight.  Allergic/Immunologic: Negative for susceptible to infections.  Neurological: Negative for dizziness, numbness, headaches, memory loss, night sweats and weakness.  Hematological: Negative for bruising/bleeding tendency and swollen glands.  Psychiatric/Behavioral: Negative for depressed mood, confusion and sleep disturbance. The patient is not nervous/anxious.     PMFS History:  Patient Active Problem List   Diagnosis Date Noted  . Arthrofibrosis of total knee arthroplasty ([place]) 06/04/2019  . Chronic lymphocytic leukemia (CLL), B-cell ([place]) 02/08/2016  . Neoplasm of left breast, primary tumor staging category Tis: lobular carcinoma in situ (LCIS) 11/18/2014  . Essential hypertension 07/29/2014  . Hyperlipidemia 07/29/2014  . Postoperative stiffness of total knee replacement ([place]) 07/20/2014  . OA (osteoarthritis) of knee 05/11/2014  . Varicose veins of lower extremities with other complications [id number]  . Hypertension   . Elevated cholesterol   . Endometrial polyp     Past Medical History:  Diagnosis Date  . Arthritis   . Cancer (HCC)    hx of skin cancer   . CLL (chronic lymphocytic leukemia) ([place])   . Elevated cholesterol 05/22/12   Nuc stress test-Normal. The patiet had a markedly positive GXT for ischemia. post stress EF 72%  . Endometrial polyp   . GERD (gastroesophageal reflux disease)   . Grade I diastolic dysfunction    noted on  echo  . Hypertension 05/22/12   ECHO-EF >55% trace tricupsid regurgitation. There is mild mitral regurgitation.   . MR (mitral regurgitation)    mild noted on echo  . Plantar fasciitis of left foot   . Pneumonia    history of  . PONV (postoperative nausea and  vomiting)     Family History  Problem Relation Age of Onset  . Heart disease Father   . Dementia Father   . Aneurysm Mother   . Hyperlipidemia Mother   . Alzheimer's disease Mother   . Osteoarthritis Mother   . Hypertension Brother   . High Cholesterol Brother   . Cancer - Other Paternal Grandmother        uterine  . Hypertension Paternal Grandfather   . Stroke Paternal Grandfather   . Heart disease Maternal Grandfather   . Cancer - Other Maternal Grandfather        lung  . Cushing syndrome Brother   . Healthy Son   . Healthy Daughter    Past Surgical History:  Procedure Laterality Date  . ABDOMINAL SURGERY     Abdominoplasty  . CESAREAN SECTION     X 2  . CHOLECYSTECTOMY    . COLONSCOPY    . DILATION AND CURETTAGE OF UTERUS    . FOOT SURGERY Left   . HYSTEROSCOPY    . KNEE ARTHROTOMY Right 06/04/2019   Procedure: Right knee arthrotomy; scar excision;  Surgeon: [name], MD;  Location: [facility];  Service: Orthopedics;  Laterality: Right;  93min  . KNEE CLOSED REDUCTION Right 07/20/2014   Procedure: RIGHT KNEE CLOSED MANIPULATION;  Surgeon: [name], MD;  Location: [facility];  Service: Orthopedics;  Laterality: Right;  . KNEE SURGERY     arthroscopic  . PARTIAL KNEE ARTHROPLASTY Left   . RADIOACTIVE SEED GUIDED EXCISIONAL BREAST BIOPSY N/A 10/06/2014   Procedure: RADIOACTIVE SEED GUIDED EXCISIONAL BREAST BIOPSY;  Surgeon: Rolm Bookbinder, MD;  Location: [place];  Service: General;  Laterality: N/A;  . REFRACTIVE SURGERY    . TOTAL KNEE ARTHROPLASTY Right 05/11/2014   Procedure: RIGHT TOTAL KNEE ARTHROPLASTY;  Surgeon: [name], MD;  Location: [facility];  Service: Orthopedics;  Laterality: Right;  . TUBAL LIGATION     Social History   Social History Narrative  . Not on file   Immunization History  Administered Date(s) Administered  . PFIZER SARS-COV-2 Vaccination 11/21/2019, 12/14/2019     Objective: Vital Signs: BP 116/76 (BP  Location: Right Arm, Patient Position: Sitting, Cuff Size: Normal)   Pulse 79   Resp 14   Ht 5\' 1"  (1.549 m)   Wt 136 lb 12.8 oz (62.1 kg)   LMP 11/19/2011   BMI 25.85 kg/m    Physical Exam Vitals and nursing note reviewed. Chaperone present: She had bilateral submental and enlarged lymph nodes in the cervical chain.  Constitutional:      Appearance: She is well-developed.  HENT:     Head: Normocephalic and atraumatic.  Eyes:     Conjunctiva/sclera: Conjunctivae normal.  Cardiovascular:     Rate and Rhythm: Normal rate and regular rhythm.     Heart sounds: Normal heart sounds.  Pulmonary:     Effort: Pulmonary effort is normal.     Breath sounds: Normal breath sounds.  Abdominal:     General: Bowel sounds are normal.     Palpations: Abdomen is soft.  Musculoskeletal:     Cervical back: Normal range of motion.  Lymphadenopathy:  Cervical: Cervical adenopathy present.  Skin:    General: Skin is warm and dry.     Capillary Refill: Capillary refill takes less than 2 seconds.  Neurological:     Mental Status: She is alert and oriented to person, place, and time.  Psychiatric:        Behavior: Behavior normal.      Musculoskeletal Exam: C-spine thoracic and lumbar spine were in good range of motion.  She had no SI joint tenderness.  Shoulder joints, elbow joints, wrist joints with good range of motion.  No MCP swelling or tenderness was noted.  She has DIP and PIP thickening consistent with osteoarthritis.  Hip joints with good range of motion.  Her right knee joint is replaced which was warm to touch.  She has limited extension in the right knee joint.  Left knee joint has partial replacement which was in full range of motion without any warmth.  Ankle joints with good range of motion.  She is some osteoarthritic changes in her feet.  No synovitis was noted.  Her left second toe is fused.  CDAI Exam: CDAI Score: -- Patient Global: --; Provider Global: -- Swollen: --;  Tender: -- Joint Exam 02/18/2020   No joint exam has been documented for this visit   There is currently no information documented on the homunculus. Go to the Rheumatology activity and complete the homunculus joint exam.  Investigation: No additional findings.  Imaging: XR Foot 2 Views Left  Result Date: 02/18/2020 First MTP narrowing and valgus deformity was noted.  Fusion of second PIP joint was noted.  PIP and DIP narrowing was noted.  Dorsal spurring and inferior calcaneal spur was noted.  No tibiotalar or subtalar joint space narrowing was noted.  No erosive changes were noted. Impression: These findings are consistent with osteoarthritis of the hand.  XR Foot 2 Views Right  Result Date: 02/18/2020 PIP and DIP narrowing was noted.  First MTP narrowing was noted.  Dorsal spurring was noted.  Inferior calcaneal spur was noted.  No erosive changes were noted. Impression: These findings are consistent with osteoarthritis of the foot.  XR Hand 2 View Left  Result Date: 02/18/2020 CMC, PIP and DIP narrowing was noted.  Third MCP with severe narrowing with hanging osteophyte was noted.  No intercarpal or radiocarpal joint space narrowing was noted.  No erosive changes were noted. Impression: These findings are consistent with osteoarthritis and inflammatory arthritis overlap.  XR Hand 2 View Right  Result Date: 02/18/2020 CMC, PIP and DIP narrowing was noted.  Second PIP subluxation was noted.  Severe narrowing of the third MCP with hanging osteophyte was noted.  Fourth MCP narrowing was noted.  No intercarpal or radiocarpal joint space narrowing was noted.  No erosive changes were noted. Impression: These findings are consistent with osteoarthritis and inflammatory arthritis overlap.   Recent Labs: Lab Results  Component Value Date   WBC 43.4 (H) 12/01/2019   HGB 12.2 12/01/2019   PLT 145 (L) 12/01/2019   NA 145 12/01/2019   K 4.4 12/01/2019   CL 109 12/01/2019   CO2 27  12/01/2019   GLUCOSE 135 (H) 12/01/2019   BUN 15 12/01/2019   CREATININE 0.84 12/01/2019   BILITOT 0.4 12/01/2019   ALKPHOS 37 (L) 12/01/2019   AST 23 12/01/2019   ALT 21 12/01/2019   PROT 6.1 (L) 12/01/2019   ALBUMIN 4.0 12/01/2019   CALCIUM 8.9 12/01/2019   GFRAA >60 12/01/2019    Speciality Comments: No  specialty comments available.  Procedures:  No procedures performed Allergies: Patient has no known allergies.   Assessment / Plan:     Visit Diagnoses: Polyarthralgia-patient complains of pain in multiple joints.  Pain in both hands -she complains of pain and stiffness in her bilateral hands.  The clinical findings are consistent with osteoarthritis without any synovitis.  Plan: XR Hand 2 View Right, XR Hand 2 View Left.  X-rays were consistent with inflammatory arthritis involving MCPs and PIP and DIP joints.  These findings could be seen in gouty arthropathy or pseudogout.  I will obtain additional labs today.  A handout on hand exercises was given.  Joint protection was discussed.  Natural anti-inflammatories were discussed.y  Pain in both feet -she complains of pain and discomfort in her bilateral feet.  No synovitis was noted.  Plan: XR Foot 2 Views Right, XR Foot 2 Views Left.  X-rays are consistent with osteoarthritis.  S/P TKR (total knee replacement), right-she has chronic discomfort and incomplete extension.  She has warmth in her right knee joint.  She has been limping because of the right knee joint discomfort.  Status post left partial knee replacement-she had full extension without any warmth.  Essential hypertension-blood pressure is normal.  Dyslipidemia-she is on statins and Zetia.  Chronic lymphocytic leukemia of B-cell type not having achieved remission (HCC) - Monitored by Dr. Lindi Adie.  Patient has elevated WBC count and she has cervical lymphadenopathy on examination today.  Patient will be contacting Dr. Lindi Adie.  Atypical lobular hyperplasia of left breast  - Diagnosed December 2015 and Ttd with tamoxifen for 5 years.  Gastroesophageal reflux disease without esophagitis-she is on omeprazole.  Prediabetes  Orders: Orders Placed This Encounter  Procedures  . XR Hand 2 View Right  . XR Hand 2 View Left  . XR Foot 2 Views Right  . XR Foot 2 Views Left  . Sedimentation rate  . Rheumatoid factor  . Cyclic citrul peptide antibody, IgG  . ANA  . Uric acid  . 14-3-3 eta Protein  . Iron, TIBC and Ferritin Panel  . Magnesium   No orders of the defined types were placed in this encounter.   Face-to-face time spent with patient was 45 minutes. Greater than 50% of time was spent in counseling and coordination of care.  Follow-Up Instructions: Return for Osteoarthritis.   Bo Merino, MD  Note - This record has been created using Editor, commissioning.  Chart creation errors have been sought, but may not always  have been located. Such creation errors do not reflect on  the standard of medical care.

## 2020-02-18 ENCOUNTER — Encounter: Payer: Self-pay | Admitting: Rheumatology

## 2020-02-18 ENCOUNTER — Ambulatory Visit: Payer: 59 | Admitting: Rheumatology

## 2020-02-18 ENCOUNTER — Other Ambulatory Visit: Payer: Self-pay

## 2020-02-18 ENCOUNTER — Ambulatory Visit: Payer: Self-pay

## 2020-02-18 VITALS — BP 116/76 | HR 79 | Resp 14 | Ht 61.0 in | Wt 136.8 lb

## 2020-02-18 DIAGNOSIS — M79672 Pain in left foot: Secondary | ICD-10-CM

## 2020-02-18 DIAGNOSIS — M79641 Pain in right hand: Secondary | ICD-10-CM

## 2020-02-18 DIAGNOSIS — I10 Essential (primary) hypertension: Secondary | ICD-10-CM

## 2020-02-18 DIAGNOSIS — N6092 Unspecified benign mammary dysplasia of left breast: Secondary | ICD-10-CM

## 2020-02-18 DIAGNOSIS — M2550 Pain in unspecified joint: Secondary | ICD-10-CM

## 2020-02-18 DIAGNOSIS — M79671 Pain in right foot: Secondary | ICD-10-CM

## 2020-02-18 DIAGNOSIS — E785 Hyperlipidemia, unspecified: Secondary | ICD-10-CM

## 2020-02-18 DIAGNOSIS — Z96652 Presence of left artificial knee joint: Secondary | ICD-10-CM

## 2020-02-18 DIAGNOSIS — E559 Vitamin D deficiency, unspecified: Secondary | ICD-10-CM

## 2020-02-18 DIAGNOSIS — Z96651 Presence of right artificial knee joint: Secondary | ICD-10-CM

## 2020-02-18 DIAGNOSIS — R7303 Prediabetes: Secondary | ICD-10-CM

## 2020-02-18 DIAGNOSIS — K219 Gastro-esophageal reflux disease without esophagitis: Secondary | ICD-10-CM

## 2020-02-18 DIAGNOSIS — M79642 Pain in left hand: Secondary | ICD-10-CM | POA: Diagnosis not present

## 2020-02-18 DIAGNOSIS — C9110 Chronic lymphocytic leukemia of B-cell type not having achieved remission: Secondary | ICD-10-CM

## 2020-02-18 NOTE — Patient Instructions (Signed)
Hand Exercises Hand exercises can be helpful for almost anyone. These exercises can strengthen the hands, improve flexibility and movement, and increase blood flow to the hands. These results can make work and daily tasks easier. Hand exercises can be especially helpful for people who have joint pain from arthritis or have nerve damage from overuse (carpal tunnel syndrome). These exercises can also help people who have injured a hand. Exercises Most of these hand exercises are gentle stretching and motion exercises. It is usually safe to do them often throughout the day. Warming up your hands before exercise may help to reduce stiffness. You can do this with gentle massage or by placing your hands in warm water for 10-15 minutes. It is normal to feel some stretching, pulling, tightness, or mild discomfort as you begin new exercises. This will gradually improve. Stop an exercise right away if you feel sudden, severe pain or your pain gets worse. Ask your health care provider which exercises are best for you. Knuckle bend or "claw" fist 1. Stand or sit with your arm, hand, and all five fingers pointed straight up. Make sure to keep your wrist straight during the exercise. 2. Gently bend your fingers down toward your palm until the tips of your fingers are touching the top of your palm. Keep your big knuckle straight and just bend the small knuckles in your fingers. 3. Hold this position for __________ seconds. 4. Straighten (extend) your fingers back to the starting position. Repeat this exercise 5-10 times with each hand. Full finger fist 1. Stand or sit with your arm, hand, and all five fingers pointed straight up. Make sure to keep your wrist straight during the exercise. 2. Gently bend your fingers into your palm until the tips of your fingers are touching the middle of your palm. 3. Hold this position for __________ seconds. 4. Extend your fingers back to the starting position, stretching every  joint fully. Repeat this exercise 5-10 times with each hand. Straight fist 1. Stand or sit with your arm, hand, and all five fingers pointed straight up. Make sure to keep your wrist straight during the exercise. 2. Gently bend your fingers at the big knuckle, where your fingers meet your hand, and the middle knuckle. Keep the knuckle at the tips of your fingers straight and try to touch the bottom of your palm. 3. Hold this position for __________ seconds. 4. Extend your fingers back to the starting position, stretching every joint fully. Repeat this exercise 5-10 times with each hand. Tabletop 1. Stand or sit with your arm, hand, and all five fingers pointed straight up. Make sure to keep your wrist straight during the exercise. 2. Gently bend your fingers at the big knuckle, where your fingers meet your hand, as far down as you can while keeping the small knuckles in your fingers straight. Think of forming a tabletop with your fingers. 3. Hold this position for __________ seconds. 4. Extend your fingers back to the starting position, stretching every joint fully. Repeat this exercise 5-10 times with each hand. Finger spread 1. Place your hand flat on a table with your palm facing down. Make sure your wrist stays straight as you do this exercise. 2. Spread your fingers and thumb apart from each other as far as you can until you feel a gentle stretch. Hold this position for __________ seconds. 3. Bring your fingers and thumb tight together again. Hold this position for __________ seconds. Repeat this exercise 5-10 times with each hand.   Making circles 1. Stand or sit with your arm, hand, and all five fingers pointed straight up. Make sure to keep your wrist straight during the exercise. 2. Make a circle by touching the tip of your thumb to the tip of your index finger. 3. Hold for __________ seconds. Then open your hand wide. 4. Repeat this motion with your thumb and each finger on your  hand. Repeat this exercise 5-10 times with each hand. Thumb motion 1. Sit with your forearm resting on a table and your wrist straight. Your thumb should be facing up toward the ceiling. Keep your fingers relaxed as you move your thumb. 2. Lift your thumb up as high as you can toward the ceiling. Hold for __________ seconds. 3. Bend your thumb across your palm as far as you can, reaching the tip of your thumb for the small finger (pinkie) side of your palm. Hold for __________ seconds. Repeat this exercise 5-10 times with each hand. Grip strengthening  1. Hold a stress ball or other soft ball in the middle of your hand. 2. Slowly increase the pressure, squeezing the ball as much as you can without causing pain. Think of bringing the tips of your fingers into the middle of your palm. All of your finger joints should bend when doing this exercise. 3. Hold your squeeze for __________ seconds, then relax. Repeat this exercise 5-10 times with each hand. Contact a health care provider if:  Your hand pain or discomfort gets much worse when you do an exercise.  Your hand pain or discomfort does not improve within 2 hours after you exercise. If you have any of these problems, stop doing these exercises right away. Do not do them again unless your health care provider says that you can. Get help right away if:  You develop sudden, severe hand pain or swelling. If this happens, stop doing these exercises right away. Do not do them again unless your health care provider says that you can. This information is not intended to replace advice given to you by your health care provider. Make sure you discuss any questions you have with your health care provider. Document Revised: 01/16/2019 Document Reviewed: 09/26/2018 [employer] Patient Education  2020 [employer]  

## 2020-02-19 ENCOUNTER — Encounter: Payer: Self-pay | Admitting: Hematology and Oncology

## 2020-02-22 NOTE — Progress Notes (Signed)
Patient Care Team: Carol Ada, MD as PCP - General (Family Medicine) Troy Sine, MD as PCP - Cardiology (Cardiology)  DIAGNOSIS:    ICD-10-CM   1. Chronic lymphocytic leukemia of B-cell type not having achieved remission (HCC)  C91.10   2. Neoplasm of left breast, primary tumor staging category Tis: lobular carcinoma in situ (LCIS)  D05.02     SUMMARY OF ONCOLOGIC HISTORY: Oncology History  Chronic lymphocytic leukemia (CLL), B-cell ([place])  02/08/2016 Initial Diagnosis   Chronic lymphocytic leukemia (CLL), B-cell (HCC)     CHIEF COMPLIANT: Follow-up of left breast LCIS on tamoxifen and CLL  INTERVAL HISTORY: Colleen Shannon is a 64 y.o. with above-mentioned history of CLL with cervical lymphadenopathy and lymphocytosis who is currently on surveillance. She also has a history of LCIS of the left breast and is currently ontamoxifen therapy 10mg  daily.She presents to the clinic todayfor examination of enlarged lymph nodes.  These lymph nodes in the neck have rapidly increased in size over the past 2 weeks and have not come down.  They are also bothering her with the pain and discomfort.  ALLERGIES:  has No Known Allergies.  MEDICATIONS:  Current Outpatient Medications  Medication Sig Dispense Refill  . acetaminophen (TYLENOL) 325 MG tablet Take 650 mg by mouth once as needed for mild pain or headache.    Marland Kitchen amLODipine (NORVASC) 5 MG tablet TAKE 1 TABLET BY MOUTH AT  BEDTIME 90 tablet 3  . Ascorbic Acid (VITAMIN C) 1000 MG tablet Take 1,000 mg by mouth daily.    . ASPIRIN 81 PO Take by mouth daily.    Marland Kitchen atorvastatin (LIPITOR) 20 MG tablet TAKE 1 TABLET BY MOUTH  DAILY 90 tablet 2  . Calcium Carb-Cholecalciferol (CALCIUM-VITAMIN D3) 600-400 MG-UNIT TABS Take 1 tablet by mouth 2 (two) times daily.    . calcium carbonate (TUMS - DOSED IN MG ELEMENTAL CALCIUM) 500 MG chewable tablet Chew 2 tablets by mouth daily as needed for indigestion or heartburn.    . carvedilol (COREG) 12.5  MG tablet TAKE 1 TABLET BY MOUTH  TWICE A DAY WITH A MEAL ,  CAN TAKE 1/2 TABLET AS  NEEDED FOR PALPITATIONS 180 tablet 3  . Cholecalciferol (DIALYVITE VITAMIN D 5000) 125 MCG (5000 UT) capsule Take 1,000 Units by mouth 2 (two) times daily.     Marland Kitchen ezetimibe (ZETIA) 10 MG tablet TAKE 1 TABLET BY MOUTH AT  BEDTIME 90 tablet 3  . Multiple Vitamin (MULTIVITAMIN WITH MINERALS) TABS tablet Take 1 tablet by mouth daily.    . Omega-3 Fatty Acids (FISH OIL) 1000 MG CAPS Take 1,000 mg by mouth daily. 120 mg-180 mg    . omeprazole (PRILOSEC) 40 MG capsule Take 40 mg by mouth daily.    . Simethicone (PHAZYME) 180 MG CAPS Take 180 mg by mouth at bedtime.     . Turmeric 500 MG TABS Take 1,000 mg by mouth daily.      No current facility-administered medications for this visit.    PHYSICAL EXAMINATION: ECOG PERFORMANCE STATUS: 1 - Symptomatic but completely ambulatory  Vitals:   02/23/20 0951  BP: 124/69  Pulse: 81  Resp: 18  Temp: 98.2 F (36.8 C)  SpO2: 98%   Filed Weights   02/23/20 0951  Weight: 134 lb 11.2 oz (61.1 kg)      LABORATORY DATA:  I have reviewed the data as listed CMP Latest Ref Rng & Units 12/01/2019 05/29/2019 11/28/2018  Glucose 70 - 99 mg/dL 135(H)  120(H) 140(H)  BUN 8 - 23 mg/dL 15 16 14   Creatinine 0.44 - 1.00 mg/dL 0.84 0.80 0.82  Sodium 135 - 145 mmol/L 145 141 142  Potassium 3.5 - 5.1 mmol/L 4.4 4.3 4.0  Chloride 98 - 111 mmol/L 109 106 106  CO2 22 - 32 mmol/L 27 25 27   Calcium 8.9 - 10.3 mg/dL 8.9 8.8(L) 8.8(L)  Total Protein 6.5 - 8.1 g/dL 6.1(L) 6.3(L) 6.1(L)  Total Bilirubin 0.3 - 1.2 mg/dL 0.4 0.3 0.4  Alkaline Phos 38 - 126 U/L 37(L) 34(L) 39  AST 15 - 41 U/L 23 26 21   ALT 0 - 44 U/L 21 23 21     Lab Results  Component Value Date   WBC 43.4 (H) 12/01/2019   HGB 12.2 12/01/2019   HCT 38.5 12/01/2019   MCV 98.2 12/01/2019   PLT 145 (L) 12/01/2019   NEUTROABS 2.8 12/01/2019    ASSESSMENT & PLAN:  Chronic lymphocytic leukemia (CLL), B-cell (HCC) CLL  CD5 and CD23 positive Stage I(small cervical lymph nodes)  Lab review:05/29/2019 WBC 39.9, platelets 144, hemoglobin 12.2 Patient has bilateral cervical lymphadenopathy in bilateral axilla lymphadenopathy as well. No hepatosplenomegaly. She does not have any B symptoms. Prognosis of CLL: FISH panel revealed + 12 and -13 Q Which are indicative of favorable prognosis. -------------------------------------------------------------------------------------------------------------------------- Enlarging lymph nodes: Bilateral cervical lymph nodes and submandibular lymph nodes have dramatically increased in size and she is also having some tenderness.  They have not decreased in size and are slightly tender. I discussed with her that I would like to refer her to see Dr. Irene Limbo who is our hematology malignancy expert.  I still do not think that she needs immediate therapy but it might be worth reexamining her molecular profile. I would like to request Dr.Kale to take over her case and see her in a week to help her make these decisions.    -------------------------------------------------------------------------------------------------------------------------------- Neoplasm of left breast, primary tumor staging category Tis: lobular carcinoma in situ (LCIS) Left breast LCIS status post lumpectomy 10/06/2014; Started tamoxifen for breast cancer risk reduction 20 mg daily 11/18/2014 ,decrease to 10 mg on 11/22/2017 completed February 2021.  Breast cancer surveillance:  Mamm[facility]/2019 [facility]: Benign breast density category B Breast exam 02/23/2020: Benign Patient will be followed by Dr.Kale in the future.  No orders of the defined types were placed in this encounter.  The patient has a good understanding of the overall plan. she agrees with it. she will call with any problems that may develop before the next visit here.  Total time spent: 30 mins including face to face time and  time spent for planning, charting and coordination of care  Nicholas Lose, MD 02/23/2020  I, Cloyde Reams Dorshimer, am acting as scribe for Dr. Nicholas Lose.  I have reviewed the above documentation for accuracy and completeness, and I agree with the above.

## 2020-02-23 ENCOUNTER — Inpatient Hospital Stay: Payer: 59 | Attending: Hematology and Oncology | Admitting: Hematology and Oncology

## 2020-02-23 ENCOUNTER — Other Ambulatory Visit: Payer: Self-pay

## 2020-02-23 DIAGNOSIS — R599 Enlarged lymph nodes, unspecified: Secondary | ICD-10-CM | POA: Insufficient documentation

## 2020-02-23 DIAGNOSIS — D0502 Lobular carcinoma in situ of left breast: Secondary | ICD-10-CM | POA: Insufficient documentation

## 2020-02-23 DIAGNOSIS — Z79810 Long term (current) use of selective estrogen receptor modulators (SERMs): Secondary | ICD-10-CM | POA: Insufficient documentation

## 2020-02-23 DIAGNOSIS — C9110 Chronic lymphocytic leukemia of B-cell type not having achieved remission: Secondary | ICD-10-CM | POA: Diagnosis not present

## 2020-02-23 NOTE — Assessment & Plan Note (Addendum)
CLL CD5 and CD23 positive Stage I(small cervical lymph nodes)  Lab review:05/29/2019 WBC 39.9, platelets 144, hemoglobin 12.2 Patient has bilateral cervical lymphadenopathy in bilateral axilla lymphadenopathy as well. No hepatosplenomegaly. She does not have any B symptoms. Prognosis of CLL: FISH panel revealed + 12 and -13 Q Which are indicative of favorable prognosis. -------------------------------------------------------------------------------------------------------------------------- Enlarging lymph nodes

## 2020-02-23 NOTE — Assessment & Plan Note (Signed)
Left breast LCIS status post lumpectomy 10/06/2014; Started tamoxifen for breast cancer risk reduction 20 mg daily 11/18/2014 ,decrease to 10 mg on 11/22/2017 completed February 2021.  Breast cancer surveillance:  Mammogram and ultrasound 05/20/2019 at [facility]: Benign breast density category B Breast exam 02/23/2020: Benign

## 2020-02-25 ENCOUNTER — Encounter: Payer: Self-pay | Admitting: Hematology and Oncology

## 2020-02-25 ENCOUNTER — Telehealth: Payer: Self-pay | Admitting: Hematology

## 2020-02-25 NOTE — Telephone Encounter (Signed)
Scheduled appt per 5/19 sch message - pt is aware of appt date and time

## 2020-02-26 LAB — ANA: Anti Nuclear Antibody (ANA): NEGATIVE

## 2020-02-26 LAB — CYCLIC CITRUL PEPTIDE ANTIBODY, IGG: Cyclic Citrullin Peptide Ab: <16 UNITS

## 2020-02-26 LAB — IRON,TIBC AND FERRITIN PANEL
%SAT: 16 % (calc) (ref 16–45)
Ferritin: 39 ng/mL (ref 16–288)
Iron: 66 ug/dL (ref 45–160)
TIBC: 416 mcg/dL (calc) (ref 250–450)

## 2020-02-26 LAB — SEDIMENTATION RATE: Sed Rate: 2 mm/h (ref 0–30)

## 2020-02-26 LAB — 14-3-3 ETA PROTEIN: 14-3-3 eta Protein: <0.2 ng/mL (ref <0.2)

## 2020-02-26 LAB — URIC ACID: Uric Acid, Serum: 5.2 mg/dL (ref 2.5–7.0)

## 2020-02-26 LAB — RHEUMATOID FACTOR: Rheumatoid fact SerPl-aCnc: <14 IU/mL (ref <14)

## 2020-02-26 LAB — MAGNESIUM: Magnesium: 2.2 mg/dL (ref 1.5–2.5)

## 2020-02-27 NOTE — Progress Notes (Signed)
All the labs are within normal limits. I will discuss labs at the follow-up visit.

## 2020-02-27 NOTE — Progress Notes (Signed)
Office Visit Note  Patient: Colleen Shannon             Date of Birth: [birth year]           MRN: [id number]             PCP: Carol Ada, MD Referring: Carol Ada, MD Visit Date: 03/10/2020 Occupation: @GUAROCC @  Subjective:  Discuss lab work   History of Present Illness: MALLEY HAUTER is a 64 y.o. female with history of osteoarthritis.  She was evaluated by Dr. Delilah Shan for pain in both ankle joints.  She had a MRI ordered by Dr. Delilah Shan. She is currently taking a prednisone taper, which has improved her discomfort.  She states she was told that she previously fractured her left ankle joint but she was unaware.  She is going to be following back up with Dr. Delilah Shan for an ankle joint cortisone injection once completing the prednisone taper. She states her right knee replacement has limited extension and left partial replacement is doing well. She denies any other joint pain or joint swelling at this time.  Activities of Daily Living:  Patient reports morning stiffness for  30  minutes.   Patient Reports nocturnal pain.  Difficulty dressing/grooming: Denies Difficulty climbing stairs: Denies Difficulty getting out of chair: Denies Difficulty using hands for taps, buttons, cutlery, and/or writing: Denies  Review of Systems  Constitutional: Negative for fatigue.  HENT: Negative for mouth sores, mouth dryness and nose dryness.   Eyes: Negative for pain, itching, visual disturbance and dryness.  Respiratory: Negative for cough, hemoptysis, shortness of breath and difficulty breathing.   Cardiovascular: Negative for chest pain, palpitations, hypertension and swelling in legs/feet.  Gastrointestinal: Negative for blood in stool, constipation and diarrhea.  Endocrine: Negative for increased urination.  Genitourinary: Negative for difficulty urinating and painful urination.  Musculoskeletal: Positive for arthralgias, joint pain, joint swelling and morning stiffness. Negative for  myalgias, muscle weakness, muscle tenderness and myalgias.  Skin: Negative for color change, pallor, rash, hair loss, nodules/bumps, redness, skin tightness, ulcers and sensitivity to sunlight.  Allergic/Immunologic: Negative for susceptible to infections.  Neurological: Negative for dizziness, headaches, memory loss and weakness.  Hematological: Positive for bruising/bleeding tendency. Negative for swollen glands.  Psychiatric/Behavioral: Negative for depressed mood, confusion and sleep disturbance. The patient is not nervous/anxious.     PMFS History:  Patient Active Problem List   Diagnosis Date Noted  . Arthrofibrosis of total knee arthroplasty ([place]) 06/04/2019  . Chronic lymphocytic leukemia (CLL), B-cell ([place]) 02/08/2016  . Neoplasm of left breast, primary tumor staging category Tis: lobular carcinoma in situ (LCIS) 11/18/2014  . Essential hypertension 07/29/2014  . Hyperlipidemia 07/29/2014  . Postoperative stiffness of total knee replacement ([place]) 07/20/2014  . OA (osteoarthritis) of knee 05/11/2014  . Varicose veins of lower extremities with other complications 67/20/9470  . Hypertension   . Elevated cholesterol   . Endometrial polyp     Past Medical History:  Diagnosis Date  . Arthritis   . Cancer (HCC)    hx of skin cancer   . CLL (chronic lymphocytic leukemia) ([place])   . Elevated cholesterol 05/22/12   Nuc stress test-Normal. The patiet had a markedly positive GXT for ischemia. post stress EF 72%  . Endometrial polyp   . GERD (gastroesophageal reflux disease)   . Grade I diastolic dysfunction    noted on echo  . Hypertension 05/22/12   ECHO-EF >55% trace tricupsid regurgitation. There is mild mitral regurgitation.   . MR (  mitral regurgitation)    mild noted on echo  . Plantar fasciitis of left foot   . Pneumonia    history of  . PONV (postoperative nausea and vomiting)     Family History  Problem Relation Age of Onset  . Heart disease Father   . Dementia  Father   . Aneurysm Mother   . Hyperlipidemia Mother   . Alzheimer's disease Mother   . Osteoarthritis Mother   . Hypertension Brother   . High Cholesterol Brother   . Cancer - Other Paternal Grandmother        uterine  . Hypertension Paternal Grandfather   . Stroke Paternal Grandfather   . Heart disease Maternal Grandfather   . Cancer - Other Maternal Grandfather        lung  . Cushing syndrome Brother   . Healthy Son   . Healthy Daughter    Past Surgical History:  Procedure Laterality Date  . ABDOMINAL SURGERY     Abdominoplasty  . CESAREAN SECTION     X 2  . CHOLECYSTECTOMY    . COLONSCOPY    . DILATION AND CURETTAGE OF UTERUS    . FOOT SURGERY Left   . HYSTEROSCOPY    . KNEE ARTHROTOMY Right 06/04/2019   Procedure: Right knee arthrotomy; scar excision;  Surgeon: [name], MD;  Location: [facility];  Service: Orthopedics;  Laterality: Right;  29mn  . KNEE CLOSED REDUCTION Right 07/20/2014   Procedure: RIGHT KNEE CLOSED MANIPULATION;  Surgeon: FGearlean Alf MD;  Location: [facility];  Service: Orthopedics;  Laterality: Right;  . KNEE SURGERY     arthroscopic  . PARTIAL KNEE ARTHROPLASTY Left   . RADIOACTIVE SEED GUIDED EXCISIONAL BREAST BIOPSY N/A 10/06/2014   Procedure: RADIOACTIVE SEED GUIDED EXCISIONAL BREAST BIOPSY;  Surgeon: MRolm Bookbinder MD;  Location: [facility]  Service: General;  Laterality: N/A;  . REFRACTIVE SURGERY    . TOTAL KNEE ARTHROPLASTY Right 05/11/2014   Procedure: RIGHT TOTAL KNEE ARTHROPLASTY;  Surgeon: FGearlean Alf MD;  Location: [facility];  Service: Orthopedics;  Laterality: Right;  . TUBAL LIGATION     Social History   Social History Narrative  . Not on file   Immunization History  Administered Date(s) Administered  . PFIZER SARS-COV-2 Vaccination 11/21/2019, 12/14/2019     Objective: Vital Signs: BP 119/61 (BP Location: Left Arm, Patient Position: Sitting, Cuff Size: Normal)   Pulse 69   Resp 14   Ht 5' 1"   (1.549 m)   Wt 132 lb (59.9 kg)   LMP 11/19/2011   BMI 24.94 kg/m    Physical Exam Vitals and nursing note reviewed.  Constitutional:      Appearance: She is well-developed.  HENT:     Head: Normocephalic and atraumatic.  Eyes:     Conjunctiva/sclera: Conjunctivae normal.  Pulmonary:     Effort: Pulmonary effort is normal.  Abdominal:     General: Bowel sounds are normal.     Palpations: Abdomen is soft.  Musculoskeletal:     Cervical back: Normal range of motion.  Lymphadenopathy:     Cervical: No cervical adenopathy.  Skin:    General: Skin is warm and dry.     Capillary Refill: Capillary refill takes less than 2 seconds.  Neurological:     Mental Status: She is alert and oriented to person, place, and time.  Psychiatric:        Behavior: Behavior normal.      Musculoskeletal Exam: C-spine,  thoracic spine, and lumbar spine good ROM.  Shoulder joints, elbow joints, wrist joints, MCPs, PIPs, and DIPs good ROM with no synovitis. PIP and DIP thickening consistent with osteoarthritis of both hands.  Complete fist formation bilaterally.  Thickening of both 3rd MCP joints. Hip joints good ROM with no discomfort.  Right knee replacement has limited flexion and extension. Warmth of right knee noted. Partial left knee replacement good ROM. Ankle joints, MCPs, PIPs, and DIPs good ROM with no synovitis.  No warmth or effusion of knee joints.  No tenderness or swelling of ankle joints. Hallux rigidus bilaterally. Dorsal spurs noted bilaterally.    CDAI Exam: CDAI Score: -- Patient Global: --; Provider Global: -- Swollen: --; Tender: -- Joint Exam 03/10/2020   No joint exam has been documented for this visit   There is currently no information documented on the homunculus. Go to the Rheumatology activity and complete the homunculus joint exam.  Investigation: No additional findings.  Imaging: XR Foot 2 Views Left  Result Date: 02/18/2020 First MTP narrowing and valgus deformity  was noted.  Fusion of second PIP joint was noted.  PIP and DIP narrowing was noted.  Dorsal spurring and inferior calcaneal spur was noted.  No tibiotalar or subtalar joint space narrowing was noted.  No erosive changes were noted. Impression: These findings are consistent with osteoarthritis of the hand.  XR Foot 2 Views Right  Result Date: 02/18/2020 PIP and DIP narrowing was noted.  First MTP narrowing was noted.  Dorsal spurring was noted.  Inferior calcaneal spur was noted.  No erosive changes were noted. Impression: These findings are consistent with osteoarthritis of the foot.  XR Hand 2 View Left  Result Date: 02/18/2020 CMC, PIP and DIP narrowing was noted.  Third MCP with severe narrowing with hanging osteophyte was noted.  No intercarpal or radiocarpal joint space narrowing was noted.  No erosive changes were noted. Impression: These findings are consistent with osteoarthritis and inflammatory arthritis overlap.  XR Hand 2 View Right  Result Date: 02/18/2020 CMC, PIP and DIP narrowing was noted.  Second PIP subluxation was noted.  Severe narrowing of the third MCP with hanging osteophyte was noted.  Fourth MCP narrowing was noted.  No intercarpal or radiocarpal joint space narrowing was noted.  No erosive changes were noted. Impression: These findings are consistent with osteoarthritis and inflammatory arthritis overlap.   Recent Labs: Lab Results  Component Value Date   WBC 35.2 (H) 03/03/2020   HGB 13.2 03/03/2020   PLT 184 03/03/2020   NA 141 03/03/2020   K 4.2 03/03/2020   CL 102 03/03/2020   CO2 30 03/03/2020   GLUCOSE 115 (H) 03/03/2020   BUN 22 03/03/2020   CREATININE 0.83 03/03/2020   BILITOT 0.5 03/03/2020   ALKPHOS 44 03/03/2020   AST 35 03/03/2020   ALT 47 (H) 03/03/2020   PROT 7.2 03/03/2020   ALBUMIN 4.7 03/03/2020   CALCIUM 10.2 03/03/2020   GFRAA >60 03/03/2020   Speciality Comments: No specialty comments available.  Procedures:  No procedures  performed Allergies: Patient has no known allergies.   Assessment / Plan:     Visit Diagnoses: Primary osteoarthritis of both hands - Bilateral third MCP severe narrowing with hanging osteophytes were noted.  02/18/20: iron studies normal, ESR 2, magnesium normal, ANA negative, RF negative, anti-CCP negative, 14 33 eta negative, uric acid 5.2: X-rays and lab work from 02/18/2020 were reviewed with the patient today in the office and all questions were addressed.  She has PIP and DIP thickening consistent with osteoarthritis of both hands.  No tenderness or synovitis was noted on exam today.  She is currently taking a prednisone taper prescribed by Dr. Delilah Shan for this discomfort she is been experiencing in both ankle joints.  She has no clinical features of rheumatoid arthritis at this time.  She was encouraged to take natural anti-inflammatories as previously discussed.  She was advised to notify us if she develops increased joint pain or joint swelling.  She will follow-up in 1 year and we will repeat x-rays at that time.  Primary osteoarthritis of both feet: She has PIP and DIP thickening consistent with osteoarthritis of both feet.  Dorsal spurs noted bilaterally.  She has chronic pain in both ankle joints and has been seeing Dr. Delilah Shan at [facility].  She has had an MRI in the past.  She is currently taking a prednisone taper which has improved her discomfort.  She will be following up with Dr. Delilah Shan soon for further evaluation and management.  S/P TKR (total knee replacement), right: Dr. Wynelle Link.  She has had a manipulation x4 in the past.  She has limited flexion and extension of the right knee with warmth but no effusion on exam.  She continues to try to work on lower extremity muscle strengthening.  Status post left partial knee replacement: Doing well.  She has good range of motion with no discomfort at this time.  No warmth or effusion was noted.  Arthralgia of both ankles: She has  chronic pain in both ankle joints.  No warmth or effusion was noted.  She has been seeing Dr. Delilah Shan.  She is currently taking a prednisone taper as prescribed.  Essential hypertension  Dyslipidemia  Chronic lymphocytic leukemia of B-cell type not having achieved remission (HCC)  Atypical lobular hyperplasia of left breast  Gastroesophageal reflux disease without esophagitis  Prediabetes  Orders: No orders of the defined types were placed in this encounter.  No orders of the defined types were placed in this encounter.   Follow-Up Instructions: Return in about 1 year (around 03/10/2021), or if symptoms worsen or fail to improve, for Osteoarthritis.   Bo Merino, MD   Scribed by-  Hazel Sams, PA-C  Note - This record has been created using Dragon software.  Chart creation errors have been sought, but may not always  have been located. Such creation errors do not reflect on  the standard of medical care.

## 2020-03-03 ENCOUNTER — Inpatient Hospital Stay: Payer: 59

## 2020-03-03 ENCOUNTER — Other Ambulatory Visit: Payer: Self-pay

## 2020-03-03 ENCOUNTER — Inpatient Hospital Stay (HOSPITAL_BASED_OUTPATIENT_CLINIC_OR_DEPARTMENT_OTHER): Payer: 59 | Admitting: Hematology

## 2020-03-03 ENCOUNTER — Encounter: Payer: Self-pay | Admitting: Hematology

## 2020-03-03 ENCOUNTER — Telehealth: Payer: Self-pay | Admitting: Hematology

## 2020-03-03 VITALS — BP 133/58 | HR 78 | Temp 97.7°F | Resp 18 | Ht 61.0 in | Wt 133.7 lb

## 2020-03-03 DIAGNOSIS — C9110 Chronic lymphocytic leukemia of B-cell type not having achieved remission: Secondary | ICD-10-CM

## 2020-03-03 DIAGNOSIS — E559 Vitamin D deficiency, unspecified: Secondary | ICD-10-CM | POA: Diagnosis not present

## 2020-03-03 LAB — CMP (CANCER CENTER ONLY)
ALT: 47 U/L — ABNORMAL HIGH (ref 0–44)
AST: 35 U/L (ref 15–41)
Albumin: 4.7 g/dL (ref 3.5–5.0)
Alkaline Phosphatase: 44 U/L (ref 38–126)
Anion gap: 9 (ref 5–15)
BUN: 22 mg/dL (ref 8–23)
CO2: 30 mmol/L (ref 22–32)
Calcium: 10.2 mg/dL (ref 8.9–10.3)
Chloride: 102 mmol/L (ref 98–111)
Creatinine: 0.83 mg/dL (ref 0.44–1.00)
GFR, Est AFR Am: >60 mL/min (ref >60)
GFR, Estimated: >60 mL/min (ref >60)
Glucose, Bld: 115 mg/dL — ABNORMAL HIGH (ref 70–99)
Potassium: 4.2 mmol/L (ref 3.5–5.1)
Sodium: 141 mmol/L (ref 135–145)
Total Bilirubin: 0.5 mg/dL (ref 0.3–1.2)
Total Protein: 7.2 g/dL (ref 6.5–8.1)

## 2020-03-03 LAB — CBC WITH DIFFERENTIAL/PLATELET
Abs Immature Granulocytes: 0.08 10*3/uL — ABNORMAL HIGH (ref 0.00–0.07)
Basophils Absolute: 0.1 10*3/uL (ref 0.0–0.1)
Basophils Relative: 0 %
Eosinophils Absolute: 0.2 10*3/uL (ref 0.0–0.5)
Eosinophils Relative: 1 %
HCT: 41.1 % (ref 36.0–46.0)
Hemoglobin: 13.2 g/dL (ref 12.0–15.0)
Immature Granulocytes: 0 %
Lymphocytes Relative: 79 %
Lymphs Abs: 28 10*3/uL — ABNORMAL HIGH (ref 0.7–4.0)
MCH: 31 pg (ref 26.0–34.0)
MCHC: 32.1 g/dL (ref 30.0–36.0)
MCV: 96.5 fL (ref 80.0–100.0)
Monocytes Absolute: 2.7 10*3/uL — ABNORMAL HIGH (ref 0.1–1.0)
Monocytes Relative: 8 %
Neutro Abs: 4.2 10*3/uL (ref 1.7–7.7)
Neutrophils Relative %: 12 %
Platelets: 184 10*3/uL (ref 150–400)
RBC: 4.26 MIL/uL (ref 3.87–5.11)
RDW: 14.6 % (ref 11.5–15.5)
WBC: 35.2 10*3/uL — ABNORMAL HIGH (ref 4.0–10.5)
nRBC: 0 % (ref 0.0–0.2)

## 2020-03-03 LAB — LACTATE DEHYDROGENASE: LDH: 355 U/L — ABNORMAL HIGH (ref 98–192)

## 2020-03-03 LAB — VITAMIN D 25 HYDROXY (VIT D DEFICIENCY, FRACTURES): Vit D, 25-Hydroxy: 67.1 ng/mL (ref 30–100)

## 2020-03-03 NOTE — Patient Instructions (Signed)
Thank you for choosing [facility] to provide your oncology and hematology care.   Should you have questions after your visit to the [facility] ([facility]), please contact this office at [phone] between 8:30 AM and 4:30 PM.  Voice mails left after 4:00 PM may not be returned until the following business day.  Calls received after 4:30 PM will be answered by an off-site Nurse Triage Line.    Prescription Refills:  Please have your pharmacy contact us directly for most prescription requests.  Contact the office directly for refills of narcotics (pain medications). Allow 48-72 hours for refills.  Appointments: Please contact the [facility] scheduling department [phone] for questions regarding [facility] appointment scheduling.  Contact the schedulers with any scheduling changes so that your appointment can be rescheduled in a timely manner.   Central Scheduling for [place] [phone] - Call to schedule procedures such as PET scans, CT scans, MRI, Ultrasound, etc.  To afford each patient quality time with our providers, please arrive 30 minutes before your scheduled appointment time.  If you arrive late for your appointment, you may be asked to reschedule.  We strive to give you quality time with our providers, and arriving late affects you and other patients whose appointments are after yours. If you are a no show for multiple scheduled visits, you may be dismissed from the clinic at the providers discretion.     Resources: [facility] Social Workers [phone] for additional information on assistance programs or assistance connecting with community support programs   [place] DSS  336-641-3447: Information regarding food stamps, Medicaid, and utility assistance [place] 336-333-6589   [place] [employer]'s shared-ride transportation service for eligible riders who have a disability that prevents them from riding the fixed route bus.   Medicare Rights Center  800-333-4114 Helps people with Medicare understand their rights and benefits, navigate the Medicare system, and secure the quality healthcare they deserve [employer] 800-227-2345 Assists patients locate various types of support and financial assistance Cancer Care: 1-800-813-HOPE (4673) Provides financial assistance, online support groups, medication/co-pay assistance.   Transportation Assistance for appointments at [facility]: Transportation Coordinator [phone]  Again, thank you for choosing [facility] for your care.       

## 2020-03-03 NOTE — Telephone Encounter (Signed)
Scheduled per 5/26 los. Printed calendar for pt.

## 2020-03-03 NOTE — Progress Notes (Signed)
HEMATOLOGY/ONCOLOGY CONSULTATION NOTE  Date of Service: 03/06/2020  Patient Care Team: Carol Ada, MD as PCP - General (Family Medicine) Troy Sine, MD as PCP - Cardiology (Cardiology)  CHIEF COMPLAINTS/PURPOSE OF CONSULTATION:  CLL  HISTORY OF PRESENTING ILLNESS:  Colleen Shannon is a wonderful 64 y.o. female who has been referred to [place] by Dr. Lindi Adie for evaluation and management of CLL. The pt reports that she is doing well overall.   The pt reports that she was being seen by Dr. Lindi Adie for Lobular Carcinoma in situ (LCIS) of the left breast. She had a lumpectomy and recently completed 5 years of Tamoxifen for breast cancer prevention. Pt was having frequent respiratory infections 3-4 years ago and after some labwork was found to have CLL. Dr. Lindi Adie has been monitoring this and pt has never required treatment. She has not had as many issues with recurrent infections in the last 2-3 years. She has had two SCC lesions found and removed. Her last was found 8-10 years ago.   She has felt well over the last 6-12 months. Pt has had lymphadenopathy over the years that often swells and shrinks rapidly. About two weeks ago she began to notice bilateral neck swelling. It was uncomfortable at times and affected her breathing. Her neck swelling is starting to settle down and is no longer causing her discomfort. She received her last vaccine ([place]) in March and has not had any infections recently. Pt has joint pain for which she saw Dr. Estanislado Pandy who did not think that the pt has RA.   01/31/2016 Peripheral Blood Flow Cytometry Report revealed "Chronic Lymphocytic Leukemia."  Most recent lab results (12/01/2019) of CBC is as follows: all values are WNL except for WBC at 43.4K, PLT at 145K, Lymphs Abs at 37.2K, Mono Abs at 3.0K, Baso Abs at 0.2K, Glucose at 135, Total Protein at 6.1, ALP at 37. 12/01/2019 LDH at 285  On review of systems, pt reports joint pain, improving lumps and denies  fevers, chills, sweats, fatigue, unexpected weight loss, abdominal pain and any other symptoms.   On PMHx the pt reports Lobular Carcinoma in situ, CLL, Lumpectomy, Arthritis, Pneumonia, SCC. 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   MEDICAL HISTORY:  Past Medical History:  Diagnosis Date  . Arthritis   . Cancer (HCC)    hx of skin cancer   . CLL (chronic lymphocytic leukemia) ([place])   . Elevated cholesterol 05/22/12   Nuc stress test-Normal. The patiet had a markedly positive GXT for ischemia. post stress EF 72%  . Endometrial polyp   . GERD (gastroesophageal reflux disease)   . Grade I diastolic dysfunction    noted on echo  . Hypertension 05/22/12   ECHO-EF >55% trace tricupsid regurgitation. There is mild mitral regurgitation.   . MR (mitral regurgitation)    mild noted on echo  . Plantar fasciitis of left foot   . Pneumonia    history of  . PONV (postoperative nausea and vomiting)     SURGICAL HISTORY: Past Surgical History:  Procedure Laterality Date  . ABDOMINAL SURGERY     Abdominoplasty  . CESAREAN SECTION     X 2  . CHOLECYSTECTOMY    . COLONSCOPY    . DILATION AND CURETTAGE OF UTERUS    . FOOT SURGERY Left   . HYSTEROSCOPY    . KNEE ARTHROTOMY Right 06/04/2019   Procedure: Right knee arthrotomy; scar excision;  Surgeon: Wynelle Link,  Pilar Plate, MD;  Location: [facility];  Service: Orthopedics;  Laterality: Right;  55min  . KNEE CLOSED REDUCTION Right 07/20/2014   Procedure: RIGHT KNEE CLOSED MANIPULATION;  Surgeon: [name], MD;  Location: [facility];  Service: Orthopedics;  Laterality: Right;  . KNEE SURGERY     arthroscopic  . PARTIAL KNEE ARTHROPLASTY Left   . RADIOACTIVE SEED GUIDED EXCISIONAL BREAST BIOPSY N/A 10/06/2014   Procedure: RADIOACTIVE SEED GUIDED EXCISIONAL BREAST BIOPSY;  Surgeon: Rolm Bookbinder, MD;  Location: [facility];  Service: General;  Laterality: N/A;  . REFRACTIVE SURGERY    . TOTAL KNEE  ARTHROPLASTY Right 05/11/2014   Procedure: RIGHT TOTAL KNEE ARTHROPLASTY;  Surgeon: [name], MD;  Location: [facility];  Service: Orthopedics;  Laterality: Right;  . TUBAL LIGATION      SOCIAL HISTORY: Social History   Socioeconomic History  . Marital status: Married    Spouse name: Not on file  . Number of children: Not on file  . Years of education: Not on file  . Highest education level: Not on file  Occupational History  . Not on file  Tobacco Use  . Smoking status: Never Smoker  . Smokeless tobacco: Never Used  Substance and Sexual Activity  . Alcohol use: Not Currently    Comment: occasional   . Drug use: No  . Sexual activity: Yes    Birth control/protection: Surgical, Post-menopausal  Other Topics Concern  . Not on file  Social History Narrative  . Not on file   Social Determinants of Health   Financial Resource Strain:   . Difficulty of Paying Living Expenses:   Food Insecurity:   . Worried About Charity fundraiser in the Last Year:   . Arboriculturist in the Last Year:   Transportation Needs:   . Film/video editor (Medical):   Marland Kitchen Lack of Transportation (Non-Medical):   Physical Activity:   . Days of Exercise per Week:   . Minutes of Exercise per Session:   Stress:   . Feeling of Stress :   Social Connections:   . Frequency of Communication with Friends and Family:   . Frequency of Social Gatherings with Friends and Family:   . Attends Religious Services:   . Active Member of Clubs or Organizations:   . Attends Archivist Meetings:   Marland Kitchen Marital Status:   Intimate Partner Violence:   . Fear of Current or Ex-Partner:   . Emotionally Abused:   Marland Kitchen Physically Abused:   . Sexually Abused:     FAMILY HISTORY: Family History  Problem Relation Age of Onset  . Heart disease Father   . Dementia Father   . Aneurysm Mother   . Hyperlipidemia Mother   . Alzheimer's disease Mother   . Osteoarthritis Mother   . Hypertension Brother   . High  Cholesterol Brother   . Cancer - Other Paternal Grandmother        uterine  . Hypertension Paternal Grandfather   . Stroke Paternal Grandfather   . Heart disease Maternal Grandfather   . Cancer - Other Maternal Grandfather        lung  . Cushing syndrome Brother   . Healthy Son   . Healthy Daughter     ALLERGIES:  has No Known Allergies.  MEDICATIONS:  Current Outpatient Medications  Medication Sig Dispense Refill  . acetaminophen (TYLENOL) 325 MG tablet Take 650 mg by mouth once as needed for mild pain or  headache.    Marland Kitchen amLODipine (NORVASC) 5 MG tablet TAKE 1 TABLET BY MOUTH AT  BEDTIME 90 tablet 3  . Ascorbic Acid (VITAMIN C) 1000 MG tablet Take 1,000 mg by mouth daily.    . ASPIRIN 81 PO Take by mouth daily.    Marland Kitchen atorvastatin (LIPITOR) 20 MG tablet TAKE 1 TABLET BY MOUTH  DAILY 90 tablet 2  . Calcium Carb-Cholecalciferol (CALCIUM-VITAMIN D3) 600-400 MG-UNIT TABS Take 1 tablet by mouth 2 (two) times daily.    . calcium carbonate (TUMS - DOSED IN MG ELEMENTAL CALCIUM) 500 MG chewable tablet Chew 2 tablets by mouth daily as needed for indigestion or heartburn.    . carvedilol (COREG) 12.5 MG tablet TAKE 1 TABLET BY MOUTH  TWICE A DAY WITH A MEAL ,  CAN TAKE 1/2 TABLET AS  NEEDED FOR PALPITATIONS 180 tablet 3  . Cholecalciferol (DIALYVITE VITAMIN D 5000) 125 MCG (5000 UT) capsule Take 1,000 Units by mouth 2 (two) times daily.     Marland Kitchen ezetimibe (ZETIA) 10 MG tablet TAKE 1 TABLET BY MOUTH AT  BEDTIME 90 tablet 3  . Multiple Vitamin (MULTIVITAMIN WITH MINERALS) TABS tablet Take 1 tablet by mouth daily.    . Omega-3 Fatty Acids (FISH OIL) 1000 MG CAPS Take 1,000 mg by mouth daily. 120 mg-180 mg    . omeprazole (PRILOSEC) 40 MG capsule Take 40 mg by mouth daily.    . Simethicone (PHAZYME) 180 MG CAPS Take 180 mg by mouth at bedtime.     . Turmeric 500 MG TABS Take 1,000 mg by mouth daily.      No current facility-administered medications for this visit.    REVIEW OF SYSTEMS:    10  Point review of Systems was done is negative except as noted above.  PHYSICAL EXAMINATION: ECOG PERFORMANCE STATUS: 0 - Asymptomatic  . Vitals:   03/03/20 1113  BP: (!) 133/58  Pulse: 78  Resp: 18  Temp: 97.7 F (36.5 C)  SpO2: 97%   Filed Weights   03/03/20 1113  Weight: 133 lb 11.2 oz (60.6 kg)   .Body mass index is 25.26 kg/m.  GENERAL:alert, in no acute distress and comfortable SKIN: no acute rashes, no significant lesions EYES: conjunctiva are pink and non-injected, sclera anicteric OROPHARYNX: MMM, no exudates, no oropharyngeal erythema or ulceration NECK: supple, no JVD LYMPH:  no palpable lymphadenopathy in the inguinal region. Few, small lymph nodes in cervical & axillary regions b/l.  LUNGS: clear to auscultation b/l with normal respiratory effort HEART: regular rate & rhythm ABDOMEN:  normoactive bowel sounds , non tender, not distended. Extremity: no pedal edema PSYCH: alert & oriented x 3 with fluent speech NEURO: no focal motor/sensory deficits  LABORATORY DATA:  I have reviewed the data as listed  . CBC Latest Ref Rng & Units 03/03/2020 12/01/2019 05/29/2019  WBC 4.0 - 10.5 K/uL 35.2(H) 43.4(H) 39.9(H)  Hemoglobin 12.0 - 15.0 g/dL 13.2 12.2 12.2  Hematocrit 36.0 - 46.0 % 41.1 38.5 38.3  Platelets 150 - 400 K/uL 184 145(L) 144(L)    . CMP Latest Ref Rng & Units 03/03/2020 12/01/2019 05/29/2019  Glucose 70 - 99 mg/dL 115(H) 135(H) 120(H)  BUN 8 - 23 mg/dL 22 15 16   Creatinine 0.44 - 1.00 mg/dL 0.83 0.84 0.80  Sodium 135 - 145 mmol/L 141 145 141  Potassium 3.5 - 5.1 mmol/L 4.2 4.4 4.3  Chloride 98 - 111 mmol/L 102 109 106  CO2 22 - 32 mmol/L 30 27 25   Calcium  8.9 - 10.3 mg/dL 10.2 8.9 8.8(L)  Total Protein 6.5 - 8.1 g/dL 7.2 6.1(L) 6.3(L)  Total Bilirubin 0.3 - 1.2 mg/dL 0.5 0.4 0.3  Alkaline Phos 38 - 126 U/L 44 37(L) 34(L)  AST 15 - 41 U/L 35 23 26  ALT 0 - 44 U/L 47(H) 21 23     RADIOGRAPHIC STUDIES: I have personally reviewed the radiological  images as listed and agreed with the findings in the report. XR Foot 2 Views Left  Result Date: 02/18/2020 First MTP narrowing and valgus deformity was noted.  Fusion of second PIP joint was noted.  PIP and DIP narrowing was noted.  Dorsal spurring and inferior calcaneal spur was noted.  No tibiotalar or subtalar joint space narrowing was noted.  No erosive changes were noted. Impression: These findings are consistent with osteoarthritis of the hand.  XR Foot 2 Views Right  Result Date: 02/18/2020 PIP and DIP narrowing was noted.  First MTP narrowing was noted.  Dorsal spurring was noted.  Inferior calcaneal spur was noted.  No erosive changes were noted. Impression: These findings are consistent with osteoarthritis of the foot.  XR Hand 2 View Left  Result Date: 02/18/2020 CMC, PIP and DIP narrowing was noted.  Third MCP with severe narrowing with hanging osteophyte was noted.  No intercarpal or radiocarpal joint space narrowing was noted.  No erosive changes were noted. Impression: These findings are consistent with osteoarthritis and inflammatory arthritis overlap.  XR Hand 2 View Right  Result Date: 02/18/2020 CMC, PIP and DIP narrowing was noted.  Second PIP subluxation was noted.  Severe narrowing of the third MCP with hanging osteophyte was noted.  Fourth MCP narrowing was noted.  No intercarpal or radiocarpal joint space narrowing was noted.  No erosive changes were noted. Impression: These findings are consistent with osteoarthritis and inflammatory arthritis overlap.   ASSESSMENT & PLAN:   64 yo with   1) Rai Stage 1 Chronic lymphocytic Leukemia. Reported to have trisomy 12 and 13q deletion (report was not available) PLAN: -Discussed patient's most recent labs from 12/01/2019, all values are WNL except for WBC at 43.4K, PLT at 145K, Lymphs Abs at 37.2K, Mono Abs at 3.0K, Baso Abs at 0.2K, Glucose at 135, Total Protein at 6.1, ALP at 37. 12/01/2019 LDH at 285.  -Advised by that she  had a Trisomy 12 mutation, which carries an average risk, and a 13q deletion, which carries a better-than-average risk. Overall she has a better-than-average to average risk profile.  -No indication to get PET/CT at this time. Will watch with labs and clinic visits.  -Advised pt that treating early when she is not symptomatic and does not have have known bulky disease or cytopenias or singificant splenomegaly does not statistically lead to better health outcomes.  -Advised pt that we would move to treat if disease is: creating cytopenias, causing an enlarged spleen, causing bulky disease that's bothersome, threatening end organs, or causing constitutional symptoms  -Advised pt that constitutional symptoms include: fatigue, fevers, chills, night sweats, abdominal pain, unexpected weight loss, etc. -Advised pt that there is about a 5% chance that CLL can transform into a high-grade lymphoma. Will continue to monitor.  -Advised pt that for those with frequent infections and IgG deficiency there is a role for IVIG therapy - she is not having frequent infections currently. She notes she had these a few years ago when she was spending a decent amount of time caring for  Her grand kid/s. -Recommend pt de-stress,  stay well hydrated, stay active, stay well-rested, and eat a balanced diet to improve overall immune health.  -Recommend pt implement sun protection strategies as CLL, especially 13q deletion, is associated with an increase in risk for non melanoma skin cancers. -Recommend pt stay up-to-date with age-appropriate cancer screenings and vaccinations.  -Recommend keeping Vitamin D 60<>90 -- 25 OH Vit D levels today @ 67.1 -Will get labs today -Will see back in 6 months with labs   2) Neoplasm of left breast, primary tumor staging category Tis: lobular carcinoma in situ (LCIS) Left breast LCIS status post lumpectomy 10/06/2014; Started tamoxifen for breast cancer risk reduction 20 mg daily 11/18/2014  ,decrease to 10 mg on 11/22/2017 completed February 2021.  Breast cancer surveillance: Mammogram and ultrasound 8/11/2020at [facility]: Benign breast density category B Breast exam 02/23/2020: Benign  PLAN -continue breast self examinations -MMG Q12 months - next in aug 2021   FOLLOW UP: Labs today RTC with Dr Irene Limbo with labs in 6 months  All of the patients questions were answered with apparent satisfaction. The patient knows to call the clinic with any problems, questions or concerns.  I spent 30 mins counseling the patient face to face. The total time spent in the appointment was 40 minutes and more than 50% was on counseling and direct patient cares.    Sullivan Lone MD [place] AAHIVMS [facility] Hematology/Oncology Physician [facility]  (Office):       [phone] (Work cell):  [phone] (Fax):           [phone]  03/06/2020 9:32 AM  I, Yevette Edwards, am acting as a scribe for Dr. Sullivan Lone.   .I have reviewed the above documentation for accuracy and completeness, and I agree with the above. Brunetta Genera MD

## 2020-03-09 ENCOUNTER — Encounter: Payer: Self-pay | Admitting: Hematology

## 2020-03-09 LAB — MULTIPLE MYELOMA PANEL, SERUM
Albumin SerPl Elph-Mcnc: 4.4 g/dL (ref 2.9–4.4)
Albumin/Glob SerPl: 2.1 — ABNORMAL HIGH (ref 0.7–1.7)
Alpha 1: 0.2 g/dL (ref 0.0–0.4)
Alpha2 Glob SerPl Elph-Mcnc: 0.5 g/dL (ref 0.4–1.0)
B-Globulin SerPl Elph-Mcnc: 1 g/dL (ref 0.7–1.3)
Gamma Glob SerPl Elph-Mcnc: 0.5 g/dL (ref 0.4–1.8)
Globulin, Total: 2.2 g/dL (ref 2.2–3.9)
IgA: 80 mg/dL — ABNORMAL LOW (ref 87–352)
IgG (Immunoglobin G), Serum: 448 mg/dL — ABNORMAL LOW (ref 586–1602)
IgM (Immunoglobulin M), Srm: 12 mg/dL — ABNORMAL LOW (ref 26–217)
Total Protein ELP: 6.6 g/dL (ref 6.0–8.5)

## 2020-03-10 ENCOUNTER — Other Ambulatory Visit: Payer: Self-pay

## 2020-03-10 ENCOUNTER — Ambulatory Visit: Payer: 59 | Admitting: Rheumatology

## 2020-03-10 ENCOUNTER — Encounter: Payer: Self-pay | Admitting: Rheumatology

## 2020-03-10 VITALS — BP 119/61 | HR 69 | Resp 14 | Ht 61.0 in | Wt 132.0 lb

## 2020-03-10 DIAGNOSIS — M25571 Pain in right ankle and joints of right foot: Secondary | ICD-10-CM

## 2020-03-10 DIAGNOSIS — Z96651 Presence of right artificial knee joint: Secondary | ICD-10-CM | POA: Diagnosis not present

## 2020-03-10 DIAGNOSIS — M19041 Primary osteoarthritis, right hand: Secondary | ICD-10-CM

## 2020-03-10 DIAGNOSIS — Z96652 Presence of left artificial knee joint: Secondary | ICD-10-CM | POA: Diagnosis not present

## 2020-03-10 DIAGNOSIS — M19071 Primary osteoarthritis, right ankle and foot: Secondary | ICD-10-CM

## 2020-03-10 DIAGNOSIS — E785 Hyperlipidemia, unspecified: Secondary | ICD-10-CM

## 2020-03-10 DIAGNOSIS — R7303 Prediabetes: Secondary | ICD-10-CM

## 2020-03-10 DIAGNOSIS — M19072 Primary osteoarthritis, left ankle and foot: Secondary | ICD-10-CM

## 2020-03-10 DIAGNOSIS — M19042 Primary osteoarthritis, left hand: Secondary | ICD-10-CM

## 2020-03-10 DIAGNOSIS — C9110 Chronic lymphocytic leukemia of B-cell type not having achieved remission: Secondary | ICD-10-CM

## 2020-03-10 DIAGNOSIS — I10 Essential (primary) hypertension: Secondary | ICD-10-CM

## 2020-03-10 DIAGNOSIS — N6092 Unspecified benign mammary dysplasia of left breast: Secondary | ICD-10-CM

## 2020-03-10 DIAGNOSIS — M25572 Pain in left ankle and joints of left foot: Secondary | ICD-10-CM

## 2020-03-10 DIAGNOSIS — K219 Gastro-esophageal reflux disease without esophagitis: Secondary | ICD-10-CM

## 2020-03-16 ENCOUNTER — Encounter: Payer: Self-pay | Admitting: Hematology and Oncology

## 2020-03-22 LAB — FISH,CLL PROGNOSTIC PANEL

## 2020-05-13 ENCOUNTER — Telehealth: Payer: Self-pay | Admitting: Cardiovascular Disease

## 2020-05-13 MED ORDER — CARVEDILOL 12.5 MG PO TABS: 6.2500 mg | ORAL_TABLET | Freq: Two times a day (BID) | ORAL | 3 refills | Status: DC

## 2020-05-13 NOTE — Telephone Encounter (Signed)
°  Patient is calling because she has lost 24 lbs recently and she is experiencing lightheadedness, shortness of breath and headaches. She states that her BP has been running low, BP 88/64 today. She wonders if maybe she needs her medication adjusted so her BP won't be so low. Please advise.

## 2020-05-13 NOTE — Telephone Encounter (Signed)
Spoke with pt, she has already cut the amlodipine and carvedilol in 1/2 for the last 1 and 1/2 weeks and her bp is still low. She will stop the amlodipine and see if that makes a difference. She will continue the 1/2 of the carvedilol twice daily. She will call back in one week if pressure continues to run low. Pt agreed with this plan.

## 2020-05-25 ENCOUNTER — Ambulatory Visit
Admission: RE | Admit: 2020-05-25 | Discharge: 2020-05-25 | Disposition: A | Discharge status: Home / Self Care | Payer: 59 | Source: Ambulatory Visit | Attending: Family Medicine | Admitting: Family Medicine

## 2020-05-25 ENCOUNTER — Other Ambulatory Visit: Payer: Self-pay | Admitting: Family Medicine

## 2020-05-25 ENCOUNTER — Other Ambulatory Visit: Payer: Self-pay

## 2020-05-25 DIAGNOSIS — R0602 Shortness of breath: Secondary | ICD-10-CM

## 2020-05-26 ENCOUNTER — Telehealth: Payer: Self-pay | Admitting: *Deleted

## 2020-05-26 NOTE — Telephone Encounter (Signed)
Patient called - Has 2-3 weeks of increased short of breath & palpitations.Saw PCP - labs similar to last labs here. CXR- bronchotic changes. Lymph nodes in neck and axilla most swollen they've ever been. Dr. Smith(PCP) enoucraged her to call this office and thinks she should be seen as soon as possible. Dr.Kale informed. Contacted patient with Dr. Grier Mitts response: Please schedule for first available appt for evaluation of swollen lymph nodes. Schedule message sent. Patient verbalized understanding

## 2020-06-01 ENCOUNTER — Other Ambulatory Visit: Payer: Self-pay

## 2020-06-01 ENCOUNTER — Inpatient Hospital Stay: Payer: 59 | Attending: Hematology and Oncology | Admitting: Hematology

## 2020-06-01 VITALS — BP 118/49 | HR 74 | Temp 98.8°F | Resp 18 | Ht 61.0 in | Wt 123.9 lb

## 2020-06-01 DIAGNOSIS — Z853 Personal history of malignant neoplasm of breast: Secondary | ICD-10-CM | POA: Diagnosis not present

## 2020-06-01 DIAGNOSIS — R0602 Shortness of breath: Secondary | ICD-10-CM | POA: Diagnosis not present

## 2020-06-01 DIAGNOSIS — C9110 Chronic lymphocytic leukemia of B-cell type not having achieved remission: Secondary | ICD-10-CM

## 2020-06-01 NOTE — Progress Notes (Signed)
HEMATOLOGY/ONCOLOGY CLINIC NOTE  Date of Service: 06/01/2020  Patient Care Team: Carol Ada, MD as PCP - General (Family Medicine) Troy Sine, MD as PCP - Cardiology (Cardiology)  CHIEF COMPLAINTS/PURPOSE OF CONSULTATION:  CLL  HISTORY OF PRESENTING ILLNESS:  Colleen Shannon is a wonderful 64 y.o. female who has been referred to [place] by Dr. Lindi Adie for evaluation and management of CLL. The pt reports that she is doing well overall.   The pt reports that she was being seen by Dr. Lindi Adie for Lobular Carcinoma in situ (LCIS) of the left breast. She had a lumpectomy and recently completed 5 years of Tamoxifen for breast cancer prevention. Pt was having frequent respiratory infections 3-4 years ago and after some labwork was found to have CLL. Dr. Lindi Adie has been monitoring this and pt has never required treatment. She has not had as many issues with recurrent infections in the last 2-3 years. She has had two SCC lesions found and removed. Her last was found 8-10 years ago.   She has felt well over the last 6-12 months. Pt has had lymphadenopathy over the years that often swells and shrinks rapidly. About two weeks ago she began to notice bilateral neck swelling. It was uncomfortable at times and affected her breathing. Her neck swelling is starting to settle down and is no longer causing her discomfort. She received her last vaccine (Daphne) in March and has not had any infections recently. Pt has joint pain for which she saw Dr. Estanislado Pandy who did not think that the pt has RA.   01/31/2016 Peripheral Blood Flow Cytometry Report revealed "Chronic Lymphocytic Leukemia."  Most recent lab results (12/01/2019) of CBC is as follows: all values are WNL except for WBC at 43.4K, PLT at 145K, Lymphs Abs at 37.2K, Mono Abs at 3.0K, Baso Abs at 0.2K, Glucose at 135, Total Protein at 6.1, ALP at 37. 12/01/2019 LDH at 285  On review of systems, pt reports joint pain, improving lumps and denies  fevers, chills, sweats, fatigue, unexpected weight loss, abdominal pain and any other symptoms.   On PMHx the pt reports Lobular Carcinoma in situ, CLL, Lumpectomy, Arthritis, Pneumonia, SCC. 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  INTERVAL HISTORY: Colleen Shannon is a wonderful 64 y.o. female who is here for evaluation and management of CLL. The patient's last visit with [place] was on 03/03/2020. The pt reports that she is doing well overall.  The pt reports that she has been experiencing episodic SOB that worsens as the day proceeds. The SOB did not come on suddenly. She tried Albuterol, which did not improve her symptoms significantly. Pt has noticed an increase in the size of her cervical lymph nodes, but denies any soreness or pressure in the area. She has lost 25 lbs via dieting.  Pt has been having low blood pressures which occasionally cause dizziness. She has discontinued Amlodipine. She has an ECHO scheduled for tomorrow and an upcoming appointment with Cardiology. Pt is a non-smoker that had significant second hand exposure as a child. Pt had repeated bronchitis and suspicion of Pneumonia prior to her CLL diagnosis. Pt received steriods in late May/early June.   Lab results (05/25/20) of CBC w/diff and CMP is as follows: all values are WNL except for WBC at 22.3K, Neutro Rel at 14.1, Lymphs Rel at 81.8, Mono Rel at 2.8, Lymphs Abs at 18.20K, Glucose at 111, ALP at 37. 05/25/2020 Vitamin D 25 (OH)  at 91.2 05/25/2020 TSH at 2.70  On review of systems, pt reports SOB, dizziness and denies fever, productive cough, night sweats, abdominal pain, unexpected weight loss, chest pain, leg swelling/redness, back pain and any other symptoms.   MEDICAL HISTORY:  Past Medical History:  Diagnosis Date  . Arthritis   . Cancer (HCC)    hx of skin cancer   . CLL (chronic lymphocytic leukemia) ([place])   . Elevated cholesterol 05/22/12   Nuc stress test-Normal. The patiet had a  markedly positive GXT for ischemia. post stress EF 72%  . Endometrial polyp   . GERD (gastroesophageal reflux disease)   . Grade I diastolic dysfunction    noted on echo  . Hypertension 05/22/12   ECHO-EF >55% trace tricupsid regurgitation. There is mild mitral regurgitation.   . MR (mitral regurgitation)    mild noted on echo  . Plantar fasciitis of left foot   . Pneumonia    history of  . PONV (postoperative nausea and vomiting)     SURGICAL HISTORY: Past Surgical History:  Procedure Laterality Date  . ABDOMINAL SURGERY     Abdominoplasty  . CESAREAN SECTION     X 2  . CHOLECYSTECTOMY    . COLONSCOPY    . DILATION AND CURETTAGE OF UTERUS    . FOOT SURGERY Left   . HYSTEROSCOPY    . KNEE ARTHROTOMY Right 06/04/2019   Procedure: Right knee arthrotomy; scar excision;  Surgeon: [name], MD;  Location: [facility];  Service: Orthopedics;  Laterality: Right;  24min  . KNEE CLOSED REDUCTION Right 07/20/2014   Procedure: RIGHT KNEE CLOSED MANIPULATION;  Surgeon: [name], MD;  Location: [facility];  Service: Orthopedics;  Laterality: Right;  . KNEE SURGERY     arthroscopic  . PARTIAL KNEE ARTHROPLASTY Left   . RADIOACTIVE SEED GUIDED EXCISIONAL BREAST BIOPSY N/A 10/06/2014   Procedure: RADIOACTIVE SEED GUIDED EXCISIONAL BREAST BIOPSY;  Surgeon: Rolm Bookbinder, MD;  Location: [facility];  Service: General;  Laterality: N/A;  . REFRACTIVE SURGERY    . TOTAL KNEE ARTHROPLASTY Right 05/11/2014   Procedure: RIGHT TOTAL KNEE ARTHROPLASTY;  Surgeon: [name], MD;  Location: [facility];  Service: Orthopedics;  Laterality: Right;  . TUBAL LIGATION      SOCIAL HISTORY: Social History   Socioeconomic History  . Marital status: Married    Spouse name: Not on file  . Number of children: Not on file  . Years of education: Not on file  . Highest education level: Not on file  Occupational History  . Not on file  Tobacco Use  . Smoking status: Never Smoker  .  Smokeless tobacco: Never Used  Vaping Use  . Vaping Use: Never used  Substance and Sexual Activity  . Alcohol use: Not Currently    Comment: occasional   . Drug use: No  . Sexual activity: Yes    Birth control/protection: Surgical, Post-menopausal  Other Topics Concern  . Not on file  Social History Narrative  . Not on file   Social Determinants of Health   Financial Resource Strain:   . Difficulty of Paying Living Expenses: Not on file  Food Insecurity:   . Worried About Charity fundraiser in the Last Year: Not on file  . Ran Out of Food in the Last Year: Not on file  Transportation Needs:   . Lack of Transportation (Medical): Not on file  . Lack of Transportation (Non-Medical): Not on file  Physical  Activity:   . Days of Exercise per Week: Not on file  . Minutes of Exercise per Session: Not on file  Stress:   . Feeling of Stress : Not on file  Social Connections:   . Frequency of Communication with Friends and Family: Not on file  . Frequency of Social Gatherings with Friends and Family: Not on file  . Attends Religious Services: Not on file  . Active Member of Clubs or Organizations: Not on file  . Attends Archivist Meetings: Not on file  . Marital Status: Not on file  Intimate Partner Violence:   . Fear of Current or Ex-Partner: Not on file  . Emotionally Abused: Not on file  . Physically Abused: Not on file  . Sexually Abused: Not on file    FAMILY HISTORY: Family History  Problem Relation Age of Onset  . Heart disease Father   . Dementia Father   . Aneurysm Mother   . Hyperlipidemia Mother   . Alzheimer's disease Mother   . Osteoarthritis Mother   . Hypertension Brother   . High Cholesterol Brother   . Cancer - Other Paternal Grandmother        uterine  . Hypertension Paternal Grandfather   . Stroke Paternal Grandfather   . Heart disease Maternal Grandfather   . Cancer - Other Maternal Grandfather        lung  . Cushing syndrome Brother     . Healthy Son   . Healthy Daughter     ALLERGIES:  has No Known Allergies.  MEDICATIONS:  Current Outpatient Medications  Medication Sig Dispense Refill  . acetaminophen (TYLENOL) 325 MG tablet Take 650 mg by mouth once as needed for mild pain or headache.    . Ascorbic Acid (VITAMIN C) 1000 MG tablet Take 1,000 mg by mouth daily.    . ASPIRIN 81 PO Take by mouth daily.    Marland Kitchen atorvastatin (LIPITOR) 20 MG tablet TAKE 1 TABLET BY MOUTH  DAILY 90 tablet 2  . Calcium Carb-Cholecalciferol (CALCIUM-VITAMIN D3) 600-400 MG-UNIT TABS Take 1 tablet by mouth 2 (two) times daily.    . calcium carbonate (TUMS - DOSED IN MG ELEMENTAL CALCIUM) 500 MG chewable tablet Chew 2 tablets by mouth daily as needed for indigestion or heartburn.    . carvedilol (COREG) 12.5 MG tablet Take 0.5 tablets (6.25 mg total) by mouth 2 (two) times daily with a meal. 180 tablet 3  . Cholecalciferol (DIALYVITE VITAMIN D 5000) 125 MCG (5000 UT) capsule Take 1,000 Units by mouth 2 (two) times daily.     . Cyanocobalamin (VITAMIN B 12 PO) Take 12 mg by mouth daily.    Marland Kitchen ezetimibe (ZETIA) 10 MG tablet TAKE 1 TABLET BY MOUTH AT  BEDTIME 90 tablet 3  . magnesium oxide (MAG-OX) 400 MG tablet Take 400 mg by mouth daily.    . Multiple Vitamin (MULTIVITAMIN WITH MINERALS) TABS tablet Take 1 tablet by mouth daily.    . Omega-3 Fatty Acids (FISH OIL) 1000 MG CAPS Take 1,000 mg by mouth daily. 120 mg-180 mg    . omeprazole (PRILOSEC) 40 MG capsule Take 20 mg by mouth daily. Pt taking differently    . Simethicone (PHAZYME) 180 MG CAPS Take 180 mg by mouth at bedtime.     . Turmeric 500 MG TABS Take 1,000 mg by mouth daily.      No current facility-administered medications for this visit.    REVIEW OF SYSTEMS:   A 10+ POINT  REVIEW OF SYSTEMS WAS OBTAINED including neurology, dermatology, psychiatry, cardiac, respiratory, lymph, extremities, GI, GU, Musculoskeletal, constitutional, breasts, reproductive, HEENT.  All pertinent positives  are noted in the HPI.  All others are negative.   PHYSICAL EXAMINATION: ECOG PERFORMANCE STATUS: 0 - Asymptomatic  . Vitals:   06/01/20 1347  BP: (!) 118/49  Pulse: 74  Resp: 18  Temp: 98.8 F (37.1 C)  SpO2: 96%   Filed Weights   06/01/20 1347  Weight: 123 lb 14.4 oz (56.2 kg)   .Body mass index is 23.41 kg/m.   GENERAL:alert, in no acute distress and comfortable SKIN: no acute rashes, no significant lesions EYES: conjunctiva are pink and non-injected, sclera anicteric OROPHARYNX: MMM, no exudates, no oropharyngeal erythema or ulceration NECK: supple, no JVD LYMPH:  no palpable lymphadenopathy in the inguinal region. Few, small lymph nodes in cervical & axillary regions b/l.  LUNGS: normal respiratory effort, dry crackles at bases HEART: regular rate & rhythm ABDOMEN:  normoactive bowel sounds , non tender, not distended. No palpable hepatosplenomegaly.  Extremity: no pedal edema PSYCH: alert & oriented x 3 with fluent speech NEURO: no focal motor/sensory deficits  LABORATORY DATA:  I have reviewed the data as listed  . CBC Latest Ref Rng & Units 03/03/2020 12/01/2019 05/29/2019  WBC 4.0 - 10.5 K/uL 35.2(H) 43.4(H) 39.9(H)  Hemoglobin 12.0 - 15.0 g/dL 13.2 12.2 12.2  Hematocrit 36 - 46 % 41.1 38.5 38.3  Platelets 150 - 400 K/uL 184 145(L) 144(L)    . CMP Latest Ref Rng & Units 03/03/2020 12/01/2019 05/29/2019  Glucose 70 - 99 mg/dL 115(H) 135(H) 120(H)  BUN 8 - 23 mg/dL 22 15 16   Creatinine 0.44 - 1.00 mg/dL 0.83 0.84 0.80  Sodium 135 - 145 mmol/L 141 145 141  Potassium 3.5 - 5.1 mmol/L 4.2 4.4 4.3  Chloride 98 - 111 mmol/L 102 109 106  CO2 22 - 32 mmol/L 30 27 25   Calcium 8.9 - 10.3 mg/dL 10.2 8.9 8.8(L)  Total Protein 6.5 - 8.1 g/dL 7.2 6.1(L) 6.3(L)  Total Bilirubin 0.3 - 1.2 mg/dL 0.5 0.4 0.3  Alkaline Phos 38 - 126 U/L 44 37(L) 34(L)  AST 15 - 41 U/L 35 23 26  ALT 0 - 44 U/L 47(H) 21 23   03/03/2020 FISH (CLL Prognosis) Panel:     RADIOGRAPHIC  STUDIES: I have personally reviewed the radiological images as listed and agreed with the findings in the report. DG Chest 2 View  Result Date: 05/25/2020 CLINICAL DATA:  64 year old female with shortness of breath. EXAM: CHEST - 2 VIEW COMPARISON:  Chest radiograph dated 08/09/2017. FINDINGS: There is chronic bronchitic changes. No focal consolidation, pleural effusion, or pneumothorax. The cardiac silhouette is within limits. No acute osseous pathology. Cholecystectomy clips. IMPRESSION: No active cardiopulmonary disease. Electronically Signed   By: Anner Crete M.D.   On: 05/25/2020 21:09    ASSESSMENT & PLAN:   64 yo with   1) Rai Stage 1 Chronic lymphocytic Leukemia. Found to have trisomy 12 and 13q deletion via 03/03/2020 FISH (CLL Prognosis) Panel  PLAN: -Discussed pt labwork today, 06/01/20; all values are WNL except for WBC at 22.3K, Neutro Rel at 14.1, Lymphs Rel at 81.8, Mono Rel at 2.8, Lymphs Abs at 18.20K, Glucose at 111, ALP at 37. -Discussed 05/25/2020 Vitamin D 25 (OH) at 91.2, TSH at 2.70 -Symptoms do not reflect current respiratory infection or progression of CLL.  -No lab or clinical evidence of CLL progression at this time. Will continue to  watch with labs and clinic visits. -Recommend pt continue to implement sun protection strategies as 13q deletion carries an increased risk for non melanoma skin cancers. -Discussed CDC guidelines regarding COVID19 vaccine booster. -Recommend pt stay up to date with appropriate vaccinations.  -Advised pt that a CTA could r/o chronic thromboembolic disease.   -Recommend pt f/u with Cardiology as scheduled -Recommend pt discuss PFT with PCP  -Recommend pt f/u for ECHO tomorrow -Will get CT neck/chest/abdomen/pelvis in 1 week -Will see back via phone in 2 weeks     2) Neoplasm of left breast, primary tumor staging category Tis: lobular carcinoma in situ (LCIS) Left breast LCIS status post lumpectomy 10/06/2014; Started tamoxifen  for breast cancer risk reduction 20 mg daily 11/18/2014 ,decrease to 10 mg on 11/22/2017 completed February 2021.  Breast cancer surveillance: Mammogram and ultrasound 8/11/2020at [facility]: Benign breast density category B Breast exam 02/23/2020: Benign  PLAN -continue breast self examinations -MMG Q12 months - next in aug 2021   FOLLOW UP: CT neck/chest/abd/pelvis in 1 week Phone visit with Dr Irene Limbo in 2 weeks (okay to add end of day if no other slots available +/-3-4 days)  The total time spent in the appt was 25 minutes and more than 50% was on counseling and direct patient cares.  All of the patient's questions were answered with apparent satisfaction. The patient knows to call the clinic with any problems, questions or concerns.    Sullivan Lone MD [place] AAHIVMS [facility] Hematology/Oncology Physician [facility]  (Office):       [phone] (Work cell):  [phone] (Fax):           [phone]  06/01/2020 4:46 PM  I, Yevette Edwards, am acting as a scribe for Dr. Sullivan Lone.   .I have reviewed the above documentation for accuracy and completeness, and I agree with the above. Brunetta Genera MD

## 2020-06-03 NOTE — Progress Notes (Signed)
Cardiology Office Note:    Date:  06/07/2020   ID:  Colleen Shannon, DOB June 23, 1956, MRN [id number]  PCP:  Carol Ada, MD  Cardiologist:  Shelva Majestic, MD  Electrophysiologist:  None   Referring MD: Carol Ada, MD   Chief Complaint: low BP, shortness of breath, and palpitations  History of Present Illness:    Colleen Shannon is a 64 y.o. female with a history of chest pain with normal Myoview in 2013, palpitations felt to likely be PVCs, hypertension, hyperlipidemia, CLL, breast cancer previously on Tamoxifen who is followed by Dr. Claiborne Billings and presents today for concerns of low BP.  Patient has a history of chest pain. She was previously treated with Coreg for presumed spasm. Myoview in 2013 was normal. Echo in 2013 showed normal LVEF and mild MR. 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 She also has a history of palpitations. She has never worn a heart monitor but based on patient's description these have been felt to be PVCs. Palpitations improved with increased dose of Coreg. Repeat Echo in 11/2017 showed LVEF of 55-60% with grade 1 diastolic dysfunction and mild MR. Patient follows with Dr. Lindi Adie (Oncology) for chronic lymphocytic leukemia. Patient was last seen by Dr. Claiborne Billings in 09/2019 at which time she was doing well from a cardiac standpoint.   Patient called our office on 05/13/2020 with reports lightheadedness, shortness of breath, and low BP as low as 88/64. Amlodipine was stopped. There is also mention of patient's Coreg being cut in half to 6.54m twice daily but patient reports she I still taking 12.550mtwice daily today. She sent our office a MyChart message on 05/23/2020 with concerns of persistent symptoms and this appointment was made.  Patient reports intermittent shortness of breath for 2 months. She describes this as having an "urge to take a deep breath  and then can't." It is not related to activity. She notes it is worse when laying down but does not wake her from sleep. No PND. She has has an intentional 25 lb weight loss and denies any lower extremity edema. No angina but notes some discomfort that she describes as "inflamation" in her airway when she is short of breath. She still has occasional "fluttering" in her chest that still sounds like PVCs. No prolonged palpitations. She was having low BP with systolic BP as low as 8840/10ut this has improved with stopping Amlodipine. She states she feels "foggy headed" at times but real lightheadedness/dizziness. No syncope. No abnormal bleeding in urine or stools.   She was was seen by her PCP last week who checked labs including BNP which was normal. Chest x-ray reportedly just showed bronchitic changes. PCP also ordered Echo which showed LVEF of 60-65% with evidence of some LV diastolic dysfunction, mild MR, mild TR, elevated RVSP of 40.97 mmHg. Patient was also seen by Oncologist who has ordered chest/abdominal/pelvic CT scheduled for tomorrow. If CT scan is unrevealing, patient states Oncologist is likely going to refer her to Pulmonology. Patient states she frequently got bronchitis/pneumonia when she was younger and is wondering if she could just have scarring of her lungs.  Reviewed Labs from PCP on 05/25/2020: - BNP 11 - WBC 22.3, Hgb 13.6, Plts 179. - Na 141, K 5.0, Glucose 111, BUN 19, Cr 0.72. Albumin 4.5, AST 34, AST 38, Alk Phos 37, Total Bili 0.7.  -  TSH 2.70.   Past Medical History:  Diagnosis Date   Arthritis    Cancer ([place])    hx of skin cancer    CLL (chronic lymphocytic leukemia) ([place])    Elevated cholesterol 05/22/12   Nuc stress test-Normal. The patiet had a markedly positive GXT for ischemia. post stress EF 72%   Endometrial polyp    GERD (gastroesophageal reflux disease)    Grade I diastolic dysfunction    noted on echo   Hypertension 05/22/12   ECHO-EF >55% trace  tricupsid regurgitation. There is mild mitral regurgitation.    MR (mitral regurgitation)    mild noted on echo   Plantar fasciitis of left foot    Pneumonia    history of   PONV (postoperative nausea and vomiting)     Past Surgical History:  Procedure Laterality Date   ABDOMINAL SURGERY     Abdominoplasty   CESAREAN SECTION     X 2   CHOLECYSTECTOMY     COLONSCOPY     DILATION AND CURETTAGE OF UTERUS     FOOT SURGERY Left    HYSTEROSCOPY     KNEE ARTHROTOMY Right 06/04/2019   Procedure: Right knee arthrotomy; scar excision;  Surgeon: [name], MD;  Location: [facility];  Service: Orthopedics;  Laterality: Right;  3mn   KNEE CLOSED REDUCTION Right 07/20/2014   Procedure: RIGHT KNEE CLOSED MANIPULATION;  Surgeon: FGearlean Alf MD;  Location: [facility];  Service: Orthopedics;  Laterality: Right;   KNEE SURGERY     arthroscopic   PARTIAL KNEE ARTHROPLASTY Left    RADIOACTIVE SEED GUIDED EXCISIONAL BREAST BIOPSY N/A 10/06/2014   Procedure: RADIOACTIVE SEED GUIDED EXCISIONAL BREAST BIOPSY;  Surgeon: MRolm Bookbinder MD;  Location: [facility]  Service: General;  Laterality: N/A;   REFRACTIVE SURGERY     TOTAL KNEE ARTHROPLASTY Right 05/11/2014   Procedure: RIGHT TOTAL KNEE ARTHROPLASTY;  Surgeon: FGearlean Alf MD;  Location: [facility];  Service: Orthopedics;  Laterality: Right;   TUBAL LIGATION      Current Medications: Current Meds  Medication Sig   Ascorbic Acid (VITAMIN C) 1000 MG tablet Take 1,000 mg by mouth daily.   aspirin EC 81 MG tablet Take 81 mg by mouth daily. Swallow whole.   atorvastatin (LIPITOR) 20 MG tablet TAKE 1 TABLET BY MOUTH  DAILY   Calcium Carb-Cholecalciferol (CALCIUM-VITAMIN D3) 600-400 MG-UNIT TABS Take 1 tablet by mouth 2 (two) times daily.   carvedilol (COREG) 12.5 MG tablet Take 0.5 tablets (6.25 mg total) by mouth 2 (two) times daily with a meal. (Patient taking differently: Take 12.5 mg by mouth 2 (two)  times daily with a meal. )   Cholecalciferol (DIALYVITE VITAMIN D 5000) 125 MCG (5000 UT) capsule Take 1,000 Units by mouth 2 (two) times daily.    ezetimibe (ZETIA) 10 MG tablet TAKE 1 TABLET BY MOUTH AT  BEDTIME   magnesium oxide (MAG-OX) 400 MG tablet Take 400 mg by mouth daily.   Multiple Vitamin (MULTIVITAMIN WITH MINERALS) TABS tablet Take 1 tablet by mouth daily.   Omega-3 Fatty Acids (FISH OIL) 1000 MG CAPS Take 1,000 mg by mouth daily. 120 mg-180 mg   omeprazole (PRILOSEC) 40 MG capsule Take 20 mg by mouth daily. Pt taking differently   Turmeric 500 MG TABS Take 1,000 mg by mouth daily.      Allergies:   Patient has no known allergies.   Social History   Socioeconomic History   Marital status: Married  Spouse name: Not on file   Number of children: Not on file   Years of education: Not on file   Highest education level: Not on file  Occupational History   Not on file  Tobacco Use   Smoking status: Never Smoker   Smokeless tobacco: Never Used  Vaping Use   Vaping Use: Never used  Substance and Sexual Activity   Alcohol use: Not Currently    Comment: occasional    Drug use: No   Sexual activity: Yes    Birth control/protection: Surgical, Post-menopausal  Other Topics Concern   Not on file  Social History Narrative   Not on file   Social Determinants of Health   Financial Resource Strain:    Difficulty of Paying Living Expenses: Not on file  Food Insecurity:    Worried About [place] in the Last Year: Not on file   Ran Out of Food in the Last Year: Not on file  Transportation Needs:    Lack of Transportation (Medical): Not on file   Lack of Transportation (Non-Medical): Not on file  Physical Activity:    Days of Exercise per Week: Not on file   Minutes of Exercise per Session: Not on file  Stress:    Feeling of Stress : Not on file  Social Connections:    Frequency of Communication with Friends and Family: Not on  file   Frequency of Social Gatherings with Friends and Family: Not on file   Attends Religious Services: Not on file   Active Member of Clubs or Organizations: Not on file   Attends Archivist Meetings: Not on file   Marital Status: Not on file     Family History: The patient's family history includes Alzheimer's disease in her mother; Aneurysm in her mother; Cancer - Other in her maternal grandfather and paternal grandmother; Cushing syndrome in her brother; Dementia in her father; Healthy in her daughter and son; Heart disease in her father and maternal grandfather; High Cholesterol in her brother; Hyperlipidemia in her mother; Hypertension in her brother and paternal grandfather; Osteoarthritis in her mother; Stroke in her paternal grandfather.  ROS:   Please see the history of present illness.     EKGs/Labs/Other Studies Reviewed:    The following studies were reviewed today:  Echocardiogram 11/14/2017: Study Conclusions: - Left ventricle: The cavity size was normal. Wall thickness was  normal. Systolic function was normal. The estimated ejection  fraction was in the range of 55% to 60%. Wall motion was normal;  there were no regional wall motion abnormalities. Doppler  parameters are consistent with abnormal left ventricular  relaxation (grade 1 diastolic dysfunction).  - Aortic valve: There was no stenosis.  - Mitral valve: There was mild regurgitation.  - Right ventricle: The cavity size was normal. Systolic function  was normal.  - Pulmonary arteries: PA peak pressure: 23 mm Hg (S).  - Inferior vena cava: The vessel was normal in size. The  respirophasic diameter changes were in the normal range (= 50%),  consistent with normal central venous pressure.   Impressions: - Normal LV size with EF 55-60%. Normal RV size and systolic  function. Mild MR. _______________  Echocardiogram 06/02/2020 [facility]): - LVEF of 60-65%. LV  chamber size normal. LV wall thickness normal. There is doppler evidence for LV diastolic dysfunction. - Normal RV size, wall dimension, and systolic function,  - Normal biatrial size. - Normal aortic valve structure and function. No AS or  AI. - Mild thickening of mitral valve anterior and posterior leaflets. Mil mitral annular calcification. Mild MR.  - Tricuspid valve structurally normal. Mild TR. Mild elevation of estimated RVSP of 40.97 mmHg. - Pericardium normal.  - Aortic root/ascending aorta normal in size.   EKG:  EKG ordered today. EKG personally reviewed and demonstrates normal sinus rhythm, rate 66 bpm, with possible Q waves in V1-V2 but no acute ischemic changes. Normal axis. Normal PR and QRS. QTc 400 ms.  Recent Labs: 02/18/2020: Magnesium 2.2 03/03/2020: ALT 47; BUN 22; Creatinine 0.83; Hemoglobin 13.2; Platelets 184; Potassium 4.2; Sodium 141  Recent Lipid Panel    Component Value Date/Time   CHOL 131 07/15/2018 0830   TRIG 84 07/15/2018 0830   HDL 56 07/15/2018 0830   CHOLHDL 2.3 07/15/2018 0830   CHOLHDL 2.3 08/19/2015 0857   VLDL 17 08/19/2015 0857   LDLCALC 58 07/15/2018 0830    Physical Exam:    Vital Signs: BP 136/72    Pulse 66    Ht 5' 1"  (1.549 m)    Wt 122 lb 9.6 oz (55.6 kg)    LMP 11/19/2011    SpO2 95%    BMI 23.17 kg/m     Wt Readings from Last 3 Encounters:  06/07/20 122 lb 9.6 oz (55.6 kg)  06/01/20 123 lb 14.4 oz (56.2 kg)  03/10/20 132 lb (59.9 kg)     General: 64 y.o. female in no acute distress. HEENT: Normocephalic and atraumatic. Sclera clear. EOMs intact. Neck: Supple. No carotid bruits. No JVD. Large cervical lymphadenopathy. Heart: RRR. Distinct S1 and S2. No murmurs, gallops, or rubs. Radial pulses 2+ and equal bilaterally. Lungs: No increased work of breathing. Very faint crackles in bilateral bases (sounds like possible atelectasis). No wheezes or rhonchi. Abdomen: Soft, non-distended, and non-tender to palpation. Bowel sounds  present. Extremities: No lower extremity edema.    Skin: Warm and dry. Neuro: Alert and oriented x3. No focal deficits. Psych: Normal affect. Responds appropriately.  Assessment:    1. Dyspnea, unspecified type   2. Palpitations   3. Essential hypertension   4. Hyperlipidemia, unspecified hyperlipidemia type     Plan:    Dyspnea - Patient reports intermittent dyspnea x2 months. Not associated with exertion. Worse when laying down but no other CHF symptoms.  - EKG today shows no acute ischemic changes. - Echo done last week at PCP's office showed LVEF of 60-65% with evidence of some LV diastolic dysfunction, mild MR, mild TR, elevated RVSP of 40.97 mmHg.  - BNP at PCP's was normal. - Patient does not look volume overloaded. Does not sounds like diastolic CHF. I do not think that patient needs diuretic at this time and she agrees. - Also does not sound like anginal equivalent.  - Patient schedule to have chest CT tomorrow. If unrevealing, patient states Oncologist will likely refer to Pulmonology. I agree with this plan. If Pulmonary work-up is unrevealing, can consider Myoview but do not think this is necessary at this time.  Palpitations - Stable. - Continue Coreg. Patient states she is still taking Coreg 12.53m twice daily (not 6.260m twice daily) - will continue current dose.  Hypertension - History of hypertension but has recently been more hypotensive with systolic BP dropping to the 80's. This improved after stopping Amlodipine. - BP mildly elevated in the office today. 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  - Continue Coreg as above. -  Patient to continue to monitor BP and let us know if consistently >130/80.  Hyperlipidemia - LDL 76 in 05/2019. - Continue Lipitor 26m daily and Zetia 171mdaily. - Labs followed by PCP.   CLL - Stable. Has not required treatment so far.  - Followed by  Oncology.  Disposition: Keep follow-up appointment with Dr. KeClaiborne Billingsn 10/2020.   Medication Adjustments/Labs and Tests Ordered: Current medicines are reviewed at length with the patient today.  Concerns regarding medicines are outlined above.  Orders Placed This Encounter  Procedures   EKG 12-Lead   No orders of the defined types were placed in this encounter.   Patient Instructions  Medication Instructions:  Your physician recommends that you continue on your current medications as directed. Please refer to the Current Medication list given to you today.  *If you need a refill on your cardiac medications before your next appointment, please call your pharmacy*  Lab Work: NONE ordered at this time of appointment   If you have labs (blood work) drawn today and your tests are completely normal, you will receive your results only by:  [place] have MyChart) OR  A paper copy in the mail If you have any lab test that is abnormal or we need to change your treatment, we will call you to review the results.  Testing/Procedures: NONE ordered at this time of appointment   Follow-Up: [facility].  As part of our continuing mission to provide you with exceptional heart care, we have created designated Provider Care Teams.  These Care Teams include your primary Cardiologist (physician) and Advanced Practice Providers (APPs -  Physician Assistants and Nurse Practitioners) who all work together to provide you with the care you need, when you need it.  Your next appointment:   Follow up as scheduled 11/01/2020 at 1:40 PM   The format for your next appointment:   In Person  Provider:   ThShelva MajesticMD  Other Instructions      Signed, CaDarreld McleanPA-C  06/07/2020 3:47 PM    [place]

## 2020-06-07 ENCOUNTER — Ambulatory Visit (INDEPENDENT_AMBULATORY_CARE_PROVIDER_SITE_OTHER): Payer: 59 | Admitting: Student

## 2020-06-07 ENCOUNTER — Other Ambulatory Visit: Payer: Self-pay | Admitting: *Deleted

## 2020-06-07 ENCOUNTER — Other Ambulatory Visit: Payer: Self-pay

## 2020-06-07 ENCOUNTER — Encounter: Payer: Self-pay | Admitting: Student

## 2020-06-07 VITALS — BP 136/72 | HR 66 | Ht 61.0 in | Wt 122.6 lb

## 2020-06-07 DIAGNOSIS — I10 Essential (primary) hypertension: Secondary | ICD-10-CM

## 2020-06-07 DIAGNOSIS — C9110 Chronic lymphocytic leukemia of B-cell type not having achieved remission: Secondary | ICD-10-CM

## 2020-06-07 DIAGNOSIS — E785 Hyperlipidemia, unspecified: Secondary | ICD-10-CM

## 2020-06-07 DIAGNOSIS — R002 Palpitations: Secondary | ICD-10-CM | POA: Diagnosis not present

## 2020-06-07 DIAGNOSIS — R0600 Dyspnea, unspecified: Secondary | ICD-10-CM

## 2020-06-07 NOTE — Patient Instructions (Signed)
Medication Instructions:  Your physician recommends that you continue on your current medications as directed. Please refer to the Current Medication list given to you today.  *If you need a refill on your cardiac medications before your next appointment, please call your pharmacy*  Lab Work: NONE ordered at this time of appointment   If you have labs (blood work) drawn today and your tests are completely normal, you will receive your results only by:  [place] (if you have MyChart) OR  A paper copy in the mail If you have any lab test that is abnormal or we need to change your treatment, we will call you to review the results.  Testing/Procedures: NONE ordered at this time of appointment   Follow-Up: At [facility], you and your health needs are our priority.  As part of our continuing mission to provide you with exceptional heart care, we have created designated Provider Care Teams.  These Care Teams include your primary Cardiologist (physician) and Advanced Practice Providers (APPs -  Physician Assistants and Nurse Practitioners) who all work together to provide you with the care you need, when you need it.  Your next appointment:   Follow up as scheduled 11/01/2020 at 1:40 PM   The format for your next appointment:   In Person  Provider:   Shelva Majestic, MD  Other Instructions

## 2020-06-08 ENCOUNTER — Encounter (HOSPITAL_COMMUNITY): Payer: Self-pay

## 2020-06-08 ENCOUNTER — Encounter: Payer: Self-pay | Admitting: Hematology

## 2020-06-08 ENCOUNTER — Ambulatory Visit (HOSPITAL_COMMUNITY)
Admission: RE | Admit: 2020-06-08 | Discharge: 2020-06-08 | Disposition: A | Discharge status: Home / Self Care | Payer: 59 | Source: Ambulatory Visit | Attending: Hematology | Admitting: Hematology

## 2020-06-08 DIAGNOSIS — R0602 Shortness of breath: Secondary | ICD-10-CM | POA: Insufficient documentation

## 2020-06-08 DIAGNOSIS — C9110 Chronic lymphocytic leukemia of B-cell type not having achieved remission: Secondary | ICD-10-CM | POA: Diagnosis not present

## 2020-06-08 HISTORY — DX: Malignant neoplasm of unspecified site of unspecified female breast: C50.919

## 2020-06-08 LAB — POCT I-STAT CREATININE: Creatinine, Ser: 0.8 mg/dL (ref 0.44–1.00)

## 2020-06-08 MED ORDER — SODIUM CHLORIDE (PF) 0.9 % IJ SOLN
INTRAMUSCULAR | Status: AC
  Filled 2020-06-08: qty 50

## 2020-06-08 MED ORDER — IOHEXOL 300 MG/ML  SOLN
100.0000 mL | Freq: Once | INTRAMUSCULAR | Status: AC | PRN
  Administered 2020-06-08: 100 mL via INTRAVENOUS

## 2020-06-09 ENCOUNTER — Other Ambulatory Visit: Payer: Self-pay | Admitting: Hematology

## 2020-06-09 DIAGNOSIS — R0602 Shortness of breath: Secondary | ICD-10-CM

## 2020-06-09 DIAGNOSIS — R918 Other nonspecific abnormal finding of lung field: Secondary | ICD-10-CM

## 2020-06-09 DIAGNOSIS — C9110 Chronic lymphocytic leukemia of B-cell type not having achieved remission: Secondary | ICD-10-CM

## 2020-06-10 ENCOUNTER — Telehealth: Payer: Self-pay | Admitting: *Deleted

## 2020-06-10 NOTE — Telephone Encounter (Signed)
Dr. Irene Limbo contacted patient 06/09/20 by phone in response to MyChart message regarding CT results. Dr. Irene Limbo placed urgent referral for Pulmonary consult and requested [facility] scheduling notified of referral - notification sent. Phone appt with patient on 06/18/20 cancelled at Dr. Grier Mitts request. Patient's next appt with Dr. Irene Limbo 08/30/20.

## 2020-06-17 ENCOUNTER — Ambulatory Visit: Payer: 59 | Admitting: Emergency Medicine

## 2020-06-17 ENCOUNTER — Other Ambulatory Visit: Payer: Self-pay

## 2020-06-17 ENCOUNTER — Encounter: Payer: Self-pay | Admitting: Emergency Medicine

## 2020-06-17 VITALS — BP 130/70 | HR 90 | Temp 97.6°F | Ht 61.0 in | Wt 123.8 lb

## 2020-06-17 DIAGNOSIS — J479 Bronchiectasis, uncomplicated: Secondary | ICD-10-CM | POA: Diagnosis not present

## 2020-06-17 DIAGNOSIS — R0602 Shortness of breath: Secondary | ICD-10-CM

## 2020-06-17 DIAGNOSIS — R0600 Dyspnea, unspecified: Secondary | ICD-10-CM | POA: Insufficient documentation

## 2020-06-17 NOTE — Progress Notes (Signed)
Subjective:    Patient ID: Colleen Shannon, female    DOB: [birth year], 64 y.o.   MRN: [id number]  HPI 64 year old never smoker with a history of left breast cancer (2015), CLL, hypertension with diastolic dysfunction, hyperlipidemia, GERD, allergic rhinitis. Review of Dr Grier Mitts notes indicates that she has not required rx for her CLL to date, does have some waxing and waning LAD. Felt to be inactive as of 06/05/20.  She is referred today for evaluation of shortness of breath. She has noted some increased size B cervical nodes over the last month. She describes dyspnea starting in June, felt that she had a restricted breathing pattern. Had to sigh frequently, present vertical or supine. No pain. May have heard some wheeze on insp or exp. Some dry cough for about 6 weeks, not really productive. Her exercise routine is less over the summer. She can get SOB with long walks, OK with stairs, inclines. She has lost 20 lbs intentionally this year. She has nasal obstruction, congestion.   CT scan of the chest abdomen and pelvis from 06/08/2020 reviewed by me, shows diffuse bronchial wall thickening with some mild tubular bronchiectatic changes in the bilateral lower lobes, bilateral clustered tree-in-bud nodularity.  There are numerous bulky lymph nodes in the chest abdomen pelvis consistent with history of lymphoma CT scan of the neck 06/08/2020 reviewed by me shows bulky bilateral cervical lymphadenopathy   Review of Systems As per HPI  Past Medical History:  Diagnosis Date  . Arthritis   . Breast CA ([place]) dx'd 2015   left  . Cancer (HCC)    hx of skin cancer   . CLL (chronic lymphocytic leukemia) ([place]) dx'd 2015  . Elevated cholesterol 05/22/12   Nuc stress test-Normal. The patiet had a markedly positive GXT for ischemia. post stress EF 72%  . Endometrial polyp   . GERD (gastroesophageal reflux disease)   . Grade I diastolic dysfunction    noted on echo  . Hypertension 05/22/12   ECHO-EF >55%  trace tricupsid regurgitation. There is mild mitral regurgitation.   . MR (mitral regurgitation)    mild noted on echo  . Plantar fasciitis of left foot   . Pneumonia    history of  . PONV (postoperative nausea and vomiting)      Family History  Problem Relation Age of Onset  . Heart disease Father   . Dementia Father   . Aneurysm Mother   . Hyperlipidemia Mother   . Alzheimer's disease Mother   . Osteoarthritis Mother   . Hypertension Brother   . High Cholesterol Brother   . Cancer - Other Paternal Grandmother        uterine  . Hypertension Paternal Grandfather   . Stroke Paternal Grandfather   . Heart disease Maternal Grandfather   . Cancer - Other Maternal Grandfather        lung  . Cushing syndrome Brother   . Healthy Son   . Healthy Daughter      Social History   Socioeconomic History  . Marital status: Married    Spouse name: Not on file  . Number of children: Not on file  . Years of education: Not on file  . Highest education level: Not on file  Occupational History  . Not on file  Tobacco Use  . Smoking status: Never Smoker  . Smokeless tobacco: Never Used  Vaping Use  . Vaping Use: Never used  Substance and Sexual Activity  . Alcohol use: Not  Currently    Comment: occasional   . Drug use: No  . Sexual activity: Yes    Birth control/protection: Surgical, Post-menopausal  Other Topics Concern  . Not on file  Social History Narrative  . Not on file   Social Determinants of Health   Financial Resource Strain:   . Difficulty of Paying Living Expenses: Not on file  Food Insecurity:   . Worried About Charity fundraiser in the Last Year: Not on file  . Ran Out of Food in the Last Year: Not on file  Transportation Needs:   . Lack of Transportation (Medical): Not on file  . Lack of Transportation (Non-Medical): Not on file  Physical Activity:   . Days of Exercise per Week: Not on file  . Minutes of Exercise per Session: Not on file  Stress:   .  Feeling of Stress : Not on file  Social Connections:   . Frequency of Communication with Friends and Family: Not on file  . Frequency of Social Gatherings with Friends and Family: Not on file  . Attends Religious Services: Not on file  . Active Member of Clubs or Organizations: Not on file  . Attends Archivist Meetings: Not on file  . Marital Status: Not on file  Intimate Partner Violence:   . Fear of Current or Ex-Partner: Not on file  . Emotionally Abused: Not on file  . Physically Abused: Not on file  . 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 ? Mold exposure.  [place] native   No Known Allergies   Outpatient Medications Prior to Visit  Medication Sig Dispense Refill  . aspirin EC 81 MG tablet Take 81 mg by mouth daily. Swallow whole.    Marland Kitchen atorvastatin (LIPITOR) 20 MG tablet TAKE 1 TABLET BY MOUTH  DAILY 90 tablet 2  . Calcium Carb-Cholecalciferol (CALCIUM-VITAMIN D3) 600-400 MG-UNIT TABS Take 1 tablet by mouth 2 (two) times daily.    . carvedilol (COREG) 12.5 MG tablet Take 0.5 tablets (6.25 mg total) by mouth 2 (two) times daily with a meal. (Patient taking differently: Take 12.5 mg by mouth 2 (two) times daily with a meal. ) 180 tablet 3  . Cholecalciferol (DIALYVITE VITAMIN D 5000) 125 MCG (5000 UT) capsule Take 1,000 Units by mouth 2 (two) times daily.     Marland Kitchen ezetimibe (ZETIA) 10 MG tablet TAKE 1 TABLET BY MOUTH AT  BEDTIME 90 tablet 3  . magnesium oxide (MAG-OX) 400 MG tablet Take 400 mg by mouth daily. Every other day    . Multiple Vitamin (MULTIVITAMIN WITH MINERALS) TABS tablet Take 1 tablet by mouth daily.    . Omega-3 Fatty Acids (FISH OIL) 1000 MG CAPS Take 1,000 mg by mouth daily. 120 mg-180 mg    . omeprazole (PRILOSEC) 40 MG capsule Take 20 mg by mouth daily. Pt taking differently    . Turmeric 500 MG TABS Take 1,000 mg by mouth daily.     . Ascorbic Acid (VITAMIN C) 1000 MG tablet Take  1,000 mg by mouth daily. (Patient not taking: Reported on 06/17/2020)     No facility-administered medications prior to visit.        Objective:   Physical Exam Vitals:   06/17/20 1435  BP: 130/70  Pulse: 90  Temp: 97.6 F (36.4 C)  TempSrc: Temporal  SpO2: 95%  Weight: 123 lb 12.8 oz (56.2 kg)  Height: 5\' 1"  (1.549 m)   Gen: Pleasant, well-nourished, in no distress,  normal affect  ENT: No lesions,  mouth clear,  oropharynx clear, no postnasal drip  Neck: No JVD, no stridor  Lungs: No use of accessory muscles, no crackles or wheezing on normal respiration, no wheeze on forced expiration  Cardiovascular: RRR, heart sounds normal, no murmur or gallops, no peripheral edema  Musculoskeletal: No deformities, no cyanosis or clubbing  Neuro: alert, awake, non focal  Skin: Warm, no lesions or rash      Assessment & Plan:  Dyspnea Specifically with difficulty getting a deep breath.  Can happen vertical or supine.  No evidence for hemidiaphragmatic paralysis or dysfunction.  Sometimes associated with wheezing on inspiration and expiration.  Her CT neck does not show any upper airway compromise or tracheal deviation.  She has bronchiectatic change and micronodular disease on CT chest but not clear that this correlates with the dyspnea.  Certainly could do so.  She needs full pulmonary function testing to evaluate further.  We may decide to perform cardiopulmonary exercise testing depending on results  Bronchiectasis without complication ([place]) With associated micronodular disease suspicious for mycobacterial colonization.  Given her immunosuppressed state she is at risk for this as well as other organisms.  I think that she needs bronchoscopy with culture data and we will work on arranging.  She does not have significant secretions, mucopurulent cough.  Baltazar Apo, MD, PhD 06/17/2020, 3:08 PM [facility] [phone] or if no answer [phone]

## 2020-06-17 NOTE — Assessment & Plan Note (Signed)
With associated micronodular disease suspicious for mycobacterial colonization.  Given her immunosuppressed state she is at risk for this as well as other organisms.  I think that she needs bronchoscopy with culture data and we will work on arranging.  She does not have significant secretions, mucopurulent cough.

## 2020-06-17 NOTE — Patient Instructions (Signed)
We will arrange for pulmonary function testing in next office visit. We will set up bronchoscopy to inspect your airways and obtain respiratory culture data Follow with Dr Lamonte Sakai in 1 month or next available

## 2020-06-17 NOTE — H&P (View-Only) (Signed)
Subjective:    Patient ID: Colleen Shannon, female    DOB: 05-04-1956, 64 y.o.   MRN: [id number]  HPI 64 year old never smoker with a history of left breast cancer (2015), CLL, hypertension with diastolic dysfunction, hyperlipidemia, GERD, allergic rhinitis. Review of Dr Grier Mitts notes indicates that she has not required rx for her CLL to date, does have some waxing and waning LAD. Felt to be inactive as of 06/05/20.  She is referred today for evaluation of shortness of breath. She has noted some increased size B cervical nodes over the last month. She describes dyspnea starting in June, felt that she had a restricted breathing pattern. Had to sigh frequently, present vertical or supine. No pain. May have heard some wheeze on insp or exp. Some dry cough for about 6 weeks, not really productive. Her exercise routine is less over the summer. She can get SOB with long walks, [place] with stairs, inclines. She has lost 20 lbs intentionally this year. She has nasal obstruction, congestion.   CT scan of the chest abdomen and pelvis from 06/08/2020 reviewed by me, shows diffuse bronchial wall thickening with some mild tubular bronchiectatic changes in the bilateral lower lobes, bilateral clustered tree-in-bud nodularity.  There are numerous bulky lymph nodes in the chest abdomen pelvis consistent with history of lymphoma CT scan of the neck 06/08/2020 reviewed by me shows bulky bilateral cervical lymphadenopathy   Review of Systems As per HPI  Past Medical History:  Diagnosis Date  . Arthritis   . Breast CA ([place]) dx'd 2015   left  . Cancer (HCC)    hx of skin cancer   . CLL (chronic lymphocytic leukemia) ([place]) dx'd 2015  . Elevated cholesterol 05/22/12   Nuc stress test-Normal. The patiet had a markedly positive GXT for ischemia. post stress EF 72%  . Endometrial polyp   . GERD (gastroesophageal reflux disease)   . Grade I diastolic dysfunction    noted on echo  . Hypertension 05/22/12   ECHO-EF >55%  trace tricupsid regurgitation. There is mild mitral regurgitation.   . MR (mitral regurgitation)    mild noted on echo  . Plantar fasciitis of left foot   . Pneumonia    history of  . PONV (postoperative nausea and vomiting)      Family History  Problem Relation Age of Onset  . Heart disease Father   . Dementia Father   . Aneurysm Mother   . Hyperlipidemia Mother   . Alzheimer's disease Mother   . Osteoarthritis Mother   . Hypertension Brother   . High Cholesterol Brother   . Cancer - Other Paternal Grandmother        uterine  . Hypertension Paternal Grandfather   . Stroke Paternal Grandfather   . Heart disease Maternal Grandfather   . Cancer - Other Maternal Grandfather        lung  . Cushing syndrome Brother   . Healthy Son   . Healthy Daughter      Social History   Socioeconomic History  . Marital status: Married    Spouse name: Not on file  . Number of children: Not on file  . Years of education: Not on file  . Highest education level: Not on file  Occupational History  . Not on file  Tobacco Use  . Smoking status: Never Smoker  . Smokeless tobacco: Never Used  Vaping Use  . Vaping Use: Never used  Substance and Sexual Activity  . Alcohol use: Not  Currently    Comment: occasional   . Drug use: No  . Sexual activity: Yes    Birth control/protection: Surgical, Post-menopausal  Other Topics Concern  . Not on file  Social History Narrative  . Not on file   Social Determinants of Health   Financial Resource Strain:   . Difficulty of Paying Living Expenses: Not on file  Food Insecurity:   . Worried About Charity fundraiser in the Last Year: Not on file  . Ran Out of Food in the Last Year: Not on file  Transportation Needs:   . Lack of Transportation (Medical): Not on file  . Lack of Transportation (Non-Medical): Not on file  Physical Activity:   . Days of Exercise per Week: Not on file  . Minutes of Exercise per Session: Not on file  Stress:   .  Feeling of Stress : Not on file  Social Connections:   . Frequency of Communication with Friends and Family: Not on file  . Frequency of Social Gatherings with Friends and Family: Not on file  . Attends Religious Services: Not on file  . Active Member of Clubs or Organizations: Not on file  . Attends Archivist Meetings: Not on file  . Marital Status: Not on file  Intimate Partner Violence:   . Fear of Current or Ex-Partner: Not on file  . Emotionally Abused: Not on file  . Physically Abused: Not on file  . 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 ? Mold exposure.  [place] native   No Known Allergies   Outpatient Medications Prior to Visit  Medication Sig Dispense Refill  . aspirin EC 81 MG tablet Take 81 mg by mouth daily. Swallow whole.    Marland Kitchen atorvastatin (LIPITOR) 20 MG tablet TAKE 1 TABLET BY MOUTH  DAILY 90 tablet 2  . Calcium Carb-Cholecalciferol (CALCIUM-VITAMIN D3) 600-400 MG-UNIT TABS Take 1 tablet by mouth 2 (two) times daily.    . carvedilol (COREG) 12.5 MG tablet Take 0.5 tablets (6.25 mg total) by mouth 2 (two) times daily with a meal. (Patient taking differently: Take 12.5 mg by mouth 2 (two) times daily with a meal. ) 180 tablet 3  . Cholecalciferol (DIALYVITE VITAMIN D 5000) 125 MCG (5000 UT) capsule Take 1,000 Units by mouth 2 (two) times daily.     Marland Kitchen ezetimibe (ZETIA) 10 MG tablet TAKE 1 TABLET BY MOUTH AT  BEDTIME 90 tablet 3  . magnesium oxide (MAG-OX) 400 MG tablet Take 400 mg by mouth daily. Every other day    . Multiple Vitamin (MULTIVITAMIN WITH MINERALS) TABS tablet Take 1 tablet by mouth daily.    . Omega-3 Fatty Acids (FISH OIL) 1000 MG CAPS Take 1,000 mg by mouth daily. 120 mg-180 mg    . omeprazole (PRILOSEC) 40 MG capsule Take 20 mg by mouth daily. Pt taking differently    . Turmeric 500 MG TABS Take 1,000 mg by mouth daily.     . Ascorbic Acid (VITAMIN C) 1000 MG tablet Take  1,000 mg by mouth daily. (Patient not taking: Reported on 06/17/2020)     No facility-administered medications prior to visit.        Objective:   Physical Exam Vitals:   06/17/20 1435  BP: 130/70  Pulse: 90  Temp: 97.6 F (36.4 C)  TempSrc: Temporal  SpO2: 95%  Weight: 123 lb 12.8 oz (56.2 kg)  Height: 5\' 1"  (1.549 m)   Gen: Pleasant, well-nourished, in no distress,  normal affect  ENT: No lesions,  mouth clear,  oropharynx clear, no postnasal drip  Neck: No JVD, no stridor  Lungs: No use of accessory muscles, no crackles or wheezing on normal respiration, no wheeze on forced expiration  Cardiovascular: RRR, heart sounds normal, no murmur or gallops, no peripheral edema  Musculoskeletal: No deformities, no cyanosis or clubbing  Neuro: alert, awake, non focal  Skin: Warm, no lesions or rash      Assessment & Plan:  Dyspnea Specifically with difficulty getting a deep breath.  Can happen vertical or supine.  No evidence for hemidiaphragmatic paralysis or dysfunction.  Sometimes associated with wheezing on inspiration and expiration.  Her CT neck does not show any upper airway compromise or tracheal deviation.  She has bronchiectatic change and micronodular disease on CT chest but not clear that this correlates with the dyspnea.  Certainly could do so.  She needs full pulmonary function testing to evaluate further.  We may decide to perform cardiopulmonary exercise testing depending on results  Bronchiectasis without complication ([place]) With associated micronodular disease suspicious for mycobacterial colonization.  Given her immunosuppressed state she is at risk for this as well as other organisms.  I think that she needs bronchoscopy with culture data and we will work on arranging.  She does not have significant secretions, mucopurulent cough.  Baltazar Apo, MD, PhD 06/17/2020, 3:08 PM [facility] [phone] or if no answer [phone]

## 2020-06-17 NOTE — Assessment & Plan Note (Signed)
Specifically with difficulty getting a deep breath.  Can happen vertical or supine.  No evidence for hemidiaphragmatic paralysis or dysfunction.  Sometimes associated with wheezing on inspiration and expiration.  Her CT neck does not show any upper airway compromise or tracheal deviation.  She has bronchiectatic change and micronodular disease on CT chest but not clear that this correlates with the dyspnea.  Certainly could do so.  She needs full pulmonary function testing to evaluate further.  We may decide to perform cardiopulmonary exercise testing depending on results

## 2020-06-18 ENCOUNTER — Ambulatory Visit: Payer: 59 | Admitting: Hematology

## 2020-06-18 ENCOUNTER — Telehealth: Payer: Self-pay | Admitting: *Deleted

## 2020-06-18 NOTE — Telephone Encounter (Signed)
Called and spoke with patient. Let them know their Bronch is scheduled for 06/29/20 with Dr. Lamonte Sakai at [facility] at 2pm.  Patient was instructed to arrive at hospital at 12:30. They were instructed to bring someone with them as they will not be able to drive home from procedure. Patient instructed not to have anything to eat or drink after midnight.   Patient's covid screening is scheduled at [facility] location for 06/26/20 at 0915.  Patient voiced understanding, nothing further needed  Routing to Dr. Lamonte Sakai as Juluis Rainier

## 2020-06-18 NOTE — Addendum Note (Signed)
Addended by: Amado Coe on: 06/18/2020 10:43 AM   Modules accepted: Orders

## 2020-06-18 NOTE — Telephone Encounter (Signed)
-----   Message from Darral Dash, RN sent at 06/18/2020 11:39 AM EDT ----- And she is scheduled at [facility]. ----- Message ----- From: Darral Dash, RN Sent: 06/18/2020  11:27 AM EDT To: Amado Coe, RN  Cloretta Ned, I just scheduled this patient for a bronch on 9/21 @ 1400 for Dr Lamonte Sakai.  He asked if you could let the patient know and get her set up for covid testing.  Thanks!  Larene Beach

## 2020-06-18 NOTE — Telephone Encounter (Signed)
Need to clarify >> this bronch is at [facility] endoscopy, Not [facility]

## 2020-06-18 NOTE — Telephone Encounter (Signed)
Both of those times will run into my office schedule. Let me speak with Endoscopy to try to squeeze something on another date.

## 2020-06-18 NOTE — Telephone Encounter (Signed)
Diagnosis: Bronchiectasis, micronodular disease  Procedure: Standard bronchoscopy with BAL  Anesthesia: MAC  Do you need Fluro? No  Priority: Normal  Date: 9/15 or 9/16 early am, before office  Alternate Date: 9/20, 9/22 - 9/24 early before office  Time: AM  Location: either [facility] or [facility] Endo  Does patient have OSA? no DM? no Or Latex allergy? no  Medication Restriction: none  Anticoagulate/Antiplatelet: none  Pre-op Labs Ordered: none    Please coordinate Pre-op COVID Testing   Dr. Lamonte Sakai ENDO availability is :  9/15 at 10:15 and 9/20 at 845am  Please advise on slot to be use for patient's procedure.

## 2020-06-19 NOTE — Telephone Encounter (Signed)
Yes clarification is that Patient's procedure is at [facility] and Patient is aware of this also.

## 2020-06-26 ENCOUNTER — Other Ambulatory Visit (HOSPITAL_COMMUNITY)
Admission: RE | Admit: 2020-06-26 | Discharge: 2020-06-26 | Disposition: A | Discharge status: Home / Self Care | Payer: 59 | Source: Ambulatory Visit | Attending: Emergency Medicine | Admitting: Emergency Medicine

## 2020-06-26 DIAGNOSIS — Z20822 Contact with and (suspected) exposure to covid-19: Secondary | ICD-10-CM | POA: Diagnosis not present

## 2020-06-26 DIAGNOSIS — Z01812 Encounter for preprocedural laboratory examination: Secondary | ICD-10-CM | POA: Diagnosis not present

## 2020-06-26 LAB — SARS CORONAVIRUS 2 (TAT 6-24 HRS): SARS Coronavirus 2: NEGATIVE

## 2020-06-28 ENCOUNTER — Telehealth: Payer: Self-pay | Admitting: Emergency Medicine

## 2020-06-28 ENCOUNTER — Encounter (HOSPITAL_COMMUNITY): Payer: Self-pay | Admitting: Emergency Medicine

## 2020-06-28 NOTE — Anesthesia Preprocedure Evaluation (Addendum)
Anesthesia Evaluation  Patient identified by MRN, date of birth, ID band Patient awake    Reviewed: Allergy & Precautions, H&P , NPO status , Patient's Chart, lab work & pertinent test results, reviewed documented beta blocker date and time   History of Anesthesia Complications (+) PONV  Airway Mallampati: II  TM Distance: >3 FB Neck ROM: Full    Dental no notable dental hx. (+) Teeth Intact, Dental Advisory Given   Pulmonary shortness of breath,    Pulmonary exam normal breath sounds clear to auscultation       Cardiovascular hypertension, Pt. on medications and Pt. on home beta blockers + dysrhythmias + Valvular Problems/Murmurs MR  Rhythm:Regular Rate:Normal     Neuro/Psych negative neurological ROS  negative psych ROS   GI/Hepatic Neg liver ROS, GERD  Medicated,  Endo/Other  negative endocrine ROS  Renal/GU negative Renal ROS  negative genitourinary   Musculoskeletal  (+) Arthritis , Osteoarthritis,    Abdominal   Peds  Hematology negative hematology ROS (+)   Anesthesia Other Findings   Reproductive/Obstetrics negative OB ROS                           Anesthesia Physical Anesthesia Plan  ASA: III  Anesthesia Plan: General   Post-op Pain Management:    Induction: Intravenous  PONV Risk Score and Plan: 4 or greater and Ondansetron, Dexamethasone and Midazolam  Airway Management Planned: Oral ETT  Additional Equipment:   Intra-op Plan:   Post-operative Plan: Extubation in OR  Informed Consent: I have reviewed the patients History and Physical, chart, labs and discussed the procedure including the risks, benefits and alternatives for the proposed anesthesia with the patient or authorized representative who has indicated his/her understanding and acceptance.     Dental advisory given  Plan Discussed with: CRNA  Anesthesia Plan Comments: (See PAT note by Karoline Caldwell,  PA-C: Follows with cardiology for history of chest pain with normal Myoview in 2013 (initially had exercise test positive for ischemia, this was followed by a nuclear study that was entirely normal), palpitations felt to likely be PVCs, hypertension, hyperlipidemia. Echo in 11/2017 showed LVEF of 55-60% with grade 1 diastolic dysfunction and mild MR. Last seen by cardiology 06/07/2020 with report of intermittent dyspnea x2 months.  Not associated with exertion.  EKG showed no acute ischemic changes.  Per note, patient had an echocardiogram done the week prior at PCPs office which showed  LVEF of 60-65% with evidence of some LV diastolic dysfunction, mild MR, mild TR, elevated RVSP of 40.97 mmHg.  She was ultimately referred to pulmonology for further evaluation and was seen by Dr. Lamonte Sakai 06/17/2020 and bronchoscopy was recommended.  Patient follows with Dr. Lindi Adie (Oncology) for chronic lymphocytic leukemia that has not yet required treatment.  She also has history of left breast cancer 2015 treated with lumpectomy and tamoxifen.  Will need day of surgery labs and evaluation.  EKG 06/07/2020: NSR. Rate 66. Septal infarct, age undetermined.  CT chest abdomen pelvis 06/08/2020: IMPRESSION: 1. Numerous bulky lymph nodes throughout the chest, abdomen, and pelvis as detailed above, consistent with history of lymphoma. Comparison to prior imaging, if available, would be helpful to assess for interval change.  2.  Splenomegaly, maximum coronal span 15.5 cm.  3. Diffuse bilateral bronchial wall thickening, mild, tubular bronchiectasis in the lower lungs, and extensive clustered and tree-in-bud nodularity throughout the lungs, most conspicuous in the lower lobes. Findings are consistent with atypical infection,  particularly atypical mycobacterium, and nodularity is substantially worsened compared to findings on examination dated 05/06/2018.  4. Aortic Atherosclerosis (ICD10-I70.0).   CT cardiac  scoring 05/06/2018: FINDINGS: Non-cardiac: See separate report from [facility].  Ascending Aorta: Normal diameter 3.3 cm  Pericardium: Normal  Coronary arteries: No calcium detected  IMPRESSION: Coronary calcium score of 0.  TTE 11/14/17: - Left ventricle: The cavity size was normal. Wall thickness was  normal. Systolic function was normal. The estimated ejection  fraction was in the range of 55% to 60%. Wall motion was normal;  there were no regional wall motion abnormalities. Doppler  parameters are consistent with abnormal left ventricular  relaxation (grade 1 diastolic dysfunction).  - Aortic valve: There was no stenosis.  - Mitral valve: There was mild regurgitation.  - Right ventricle: The cavity size was normal. Systolic function  was normal.  - Pulmonary arteries: PA peak pressure: 23 mm Hg (S).  - Inferior vena cava: The vessel was normal in size. The  respirophasic diameter changes were in the normal range (= 50%),  consistent with normal central venous pressure.   Impressions:   - Normal LV size with EF 55-60%. Normal RV size and systolic  function. Mild MR.  )       Anesthesia Quick Evaluation

## 2020-06-28 NOTE — Progress Notes (Signed)
PCP - Dr Carol Ada Cardiologist - Dr Shelva Majestic Oncology - Dr Sullivan Lone  Chest x-ray - 05/25/20 (2V) EKG - 06/07/20 Stress Test - 05/22/12 ECHO - 11/14/17 Cardiac Cath - n/a  Aspirin Instructions: Last dose on 06/18/20 per patient.    Anesthesia review: Yes  STOP now taking any Aspirin (unless otherwise instructed by your surgeon), Aleve, Naproxen, Ibuprofen, Motrin, Advil, Goody's, BC's, all herbal medications, fish oil, and all vitamins.   Coronavirus Screening Covid test on 06/26/20 was negative.  Patient verbalized understanding of instructions that were given via phone.

## 2020-06-28 NOTE — Telephone Encounter (Signed)
I have called Sharyn Lull back and left a voicemail with the code [phone]

## 2020-06-28 NOTE — Progress Notes (Signed)
Anesthesia Chart Review: Same-day work-up  Follows with cardiology for history of chest pain with normal Myoview in 2013 (initially had exercise test positive for ischemia, this was followed by a nuclear study that was entirely normal), palpitations felt to likely be PVCs, hypertension, hyperlipidemia. Echo in 11/2017 showed LVEF of 55-60% with grade 1 diastolic dysfunction and mild MR. Last seen by cardiology 06/07/2020 with report of intermittent dyspnea x2 months.  Not associated with exertion.  EKG showed no acute ischemic changes.  Per note, patient had an echocardiogram done the week prior at PCPs office which showed  LVEF of 60-65% with evidence of some LV diastolic dysfunction, mild MR, mild TR, elevated RVSP of 40.97 mmHg.  She was ultimately referred to pulmonology for further evaluation and was seen by Dr. Lamonte Sakai 06/17/2020 and bronchoscopy was recommended.  Patient follows with Dr. Lindi Adie (Oncology) for chronic lymphocytic leukemia that has not yet required treatment.  She also has history of left breast cancer 2015 treated with lumpectomy and tamoxifen.  Will need day of surgery labs and evaluation.  EKG 06/07/2020: NSR. Rate 66. Septal infarct, age undetermined.  CT chest abdomen pelvis 06/08/2020: IMPRESSION: 1. Numerous bulky lymph nodes throughout the chest, abdomen, and pelvis as detailed above, consistent with history of lymphoma. Comparison to prior imaging, if available, would be helpful to assess for interval change.  2.  Splenomegaly, maximum coronal span 15.5 cm.  3. Diffuse bilateral bronchial wall thickening, mild, tubular bronchiectasis in the lower lungs, and extensive clustered and tree-in-bud nodularity throughout the lungs, most conspicuous in the lower lobes. Findings are consistent with atypical infection, particularly atypical mycobacterium, and nodularity is substantially worsened compared to findings on examination dated 05/06/2018.  4. Aortic  Atherosclerosis (ICD10-I70.0).   CT cardiac scoring 05/06/2018: FINDINGS: Non-cardiac: See separate report from [facility].  Ascending Aorta: Normal diameter 3.3 cm  Pericardium: Normal  Coronary arteries: No calcium detected  IMPRESSION: Coronary calcium score of 0.  TTE 11/14/17: - Left ventricle: The cavity size was normal. Wall thickness was  normal. Systolic function was normal. The estimated ejection  fraction was in the range of 55% to 60%. Wall motion was normal;  there were no regional wall motion abnormalities. Doppler  parameters are consistent with abnormal left ventricular  relaxation (grade 1 diastolic dysfunction).  - Aortic valve: There was no stenosis.  - Mitral valve: There was mild regurgitation.  - Right ventricle: The cavity size was normal. Systolic function  was normal.  - Pulmonary arteries: PA peak pressure: 23 mm Hg (S).  - Inferior vena cava: The vessel was normal in size. The  respirophasic diameter changes were in the normal range (= 50%),  consistent with normal central venous pressure.   Impressions:   - Normal LV size with EF 55-60%. Normal RV size and systolic  function. Mild MR.    Wynonia Musty [facility] Short Stay Center/Anesthesiology Phone [phone] 06/28/2020 10:07 AM

## 2020-06-29 ENCOUNTER — Ambulatory Visit (HOSPITAL_COMMUNITY): Payer: 59 | Admitting: Physician Assistant

## 2020-06-29 ENCOUNTER — Ambulatory Visit (HOSPITAL_COMMUNITY): Payer: 59

## 2020-06-29 ENCOUNTER — Encounter (HOSPITAL_COMMUNITY)
Admission: RE | Disposition: A | Discharge status: Home / Self Care | Payer: Self-pay | Source: Home / Self Care | Attending: Emergency Medicine

## 2020-06-29 ENCOUNTER — Ambulatory Visit (HOSPITAL_COMMUNITY)
Admission: RE | Admit: 2020-06-29 | Discharge: 2020-06-29 | Disposition: A | Discharge status: Home / Self Care | Payer: 59 | Attending: Emergency Medicine | Admitting: Emergency Medicine

## 2020-06-29 ENCOUNTER — Other Ambulatory Visit: Payer: Self-pay

## 2020-06-29 ENCOUNTER — Encounter (HOSPITAL_COMMUNITY): Payer: Self-pay | Admitting: Emergency Medicine

## 2020-06-29 DIAGNOSIS — Z7982 Long term (current) use of aspirin: Secondary | ICD-10-CM | POA: Diagnosis not present

## 2020-06-29 DIAGNOSIS — Z79899 Other long term (current) drug therapy: Secondary | ICD-10-CM | POA: Insufficient documentation

## 2020-06-29 DIAGNOSIS — C9110 Chronic lymphocytic leukemia of B-cell type not having achieved remission: Secondary | ICD-10-CM | POA: Diagnosis not present

## 2020-06-29 DIAGNOSIS — Z9889 Other specified postprocedural states: Secondary | ICD-10-CM

## 2020-06-29 DIAGNOSIS — R590 Localized enlarged lymph nodes: Secondary | ICD-10-CM | POA: Insufficient documentation

## 2020-06-29 DIAGNOSIS — Z853 Personal history of malignant neoplasm of breast: Secondary | ICD-10-CM | POA: Insufficient documentation

## 2020-06-29 DIAGNOSIS — M1990 Unspecified osteoarthritis, unspecified site: Secondary | ICD-10-CM | POA: Diagnosis not present

## 2020-06-29 DIAGNOSIS — J479 Bronchiectasis, uncomplicated: Secondary | ICD-10-CM

## 2020-06-29 DIAGNOSIS — I10 Essential (primary) hypertension: Secondary | ICD-10-CM | POA: Insufficient documentation

## 2020-06-29 DIAGNOSIS — K219 Gastro-esophageal reflux disease without esophagitis: Secondary | ICD-10-CM | POA: Insufficient documentation

## 2020-06-29 HISTORY — DX: Dyspnea, unspecified: R06.00

## 2020-06-29 HISTORY — DX: Cardiac arrhythmia, unspecified: I49.9

## 2020-06-29 HISTORY — PX: BRONCHIAL BIOPSY: SHX5109

## 2020-06-29 HISTORY — PX: VIDEO BRONCHOSCOPY: SHX5072

## 2020-06-29 HISTORY — PX: BRONCHIAL WASHINGS: SHX5105

## 2020-06-29 HISTORY — DX: Bronchiectasis, uncomplicated: J47.9

## 2020-06-29 HISTORY — DX: Personal history of other specified conditions: Z87.898

## 2020-06-29 HISTORY — PX: BRONCHIAL BRUSHINGS: SHX5108

## 2020-06-29 LAB — BASIC METABOLIC PANEL
Anion gap: 8 (ref 5–15)
BUN: 16 mg/dL (ref 8–23)
CO2: 26 mmol/L (ref 22–32)
Calcium: 9.3 mg/dL (ref 8.9–10.3)
Chloride: 108 mmol/L (ref 98–111)
Creatinine, Ser: 0.63 mg/dL (ref 0.44–1.00)
GFR calc Af Amer: >60 mL/min (ref >60)
GFR calc non Af Amer: >60 mL/min (ref >60)
Glucose, Bld: 117 mg/dL — ABNORMAL HIGH (ref 70–99)
Potassium: 4.3 mmol/L (ref 3.5–5.1)
Sodium: 142 mmol/L (ref 135–145)

## 2020-06-29 SURGERY — VIDEO BRONCHOSCOPY WITHOUT FLUORO
Anesthesia: General

## 2020-06-29 MED ORDER — PHENYLEPHRINE 40 MCG/ML (10ML) SYRINGE FOR IV PUSH (FOR BLOOD PRESSURE SUPPORT)
PREFILLED_SYRINGE | INTRAVENOUS | Status: DC | PRN
  Administered 2020-06-29: 80 ug via INTRAVENOUS

## 2020-06-29 MED ORDER — ONDANSETRON HCL 4 MG/2ML IJ SOLN
INTRAMUSCULAR | Status: DC | PRN
  Administered 2020-06-29: 4 mg via INTRAVENOUS

## 2020-06-29 MED ORDER — PHENYLEPHRINE HCL-NACL 10-0.9 MG/250ML-% IV SOLN
INTRAVENOUS | Status: DC | PRN
  Administered 2020-06-29: 50 ug/min via INTRAVENOUS

## 2020-06-29 MED ORDER — PROPOFOL 10 MG/ML IV BOLUS
INTRAVENOUS | Status: DC | PRN
  Administered 2020-06-29: 120 mg via INTRAVENOUS

## 2020-06-29 MED ORDER — ATORVASTATIN CALCIUM 20 MG PO TABS: 20.0000 mg | ORAL_TABLET | Freq: Every day | ORAL | Status: DC

## 2020-06-29 MED ORDER — SUGAMMADEX SODIUM 200 MG/2ML IV SOLN
INTRAVENOUS | Status: DC | PRN
  Administered 2020-06-29: 200 mg via INTRAVENOUS

## 2020-06-29 MED ORDER — DEXAMETHASONE SODIUM PHOSPHATE 10 MG/ML IJ SOLN
INTRAMUSCULAR | Status: DC | PRN
  Administered 2020-06-29: 5 mg via INTRAVENOUS

## 2020-06-29 MED ORDER — FENTANYL CITRATE (PF) 250 MCG/5ML IJ SOLN
INTRAMUSCULAR | Status: DC | PRN
Start: 2020-06-29 — End: 2020-06-29
  Administered 2020-06-29: 50 ug via INTRAVENOUS

## 2020-06-29 MED ORDER — ROCURONIUM BROMIDE 10 MG/ML (PF) SYRINGE
PREFILLED_SYRINGE | INTRAVENOUS | Status: DC | PRN
  Administered 2020-06-29: 50 mg via INTRAVENOUS

## 2020-06-29 MED ORDER — LIDOCAINE 2% (20 MG/ML) 5 ML SYRINGE
INTRAMUSCULAR | Status: DC | PRN
  Administered 2020-06-29: 60 mg via INTRAVENOUS

## 2020-06-29 MED ORDER — ACETAMINOPHEN 500 MG PO TABS
1000.0000 mg | ORAL_TABLET | Freq: Once | ORAL | Status: AC
  Administered 2020-06-29: 1000 mg via ORAL
  Filled 2020-06-29: qty 2

## 2020-06-29 MED ORDER — MIDAZOLAM HCL 2 MG/2ML IJ SOLN
INTRAMUSCULAR | Status: DC | PRN
  Administered 2020-06-29: 2 mg via INTRAVENOUS

## 2020-06-29 MED ORDER — LACTATED RINGERS IV SOLN: INTRAVENOUS | Status: DC

## 2020-06-29 MED ORDER — CHLORHEXIDINE GLUCONATE 0.12 % MT SOLN
OROMUCOSAL | Status: AC
  Filled 2020-06-29: qty 15

## 2020-06-29 NOTE — Interval H&P Note (Signed)
History and Physical Interval Note:  06/29/2020 9:26 AM  Alene Mires  has presented today for surgery, with the diagnosis of bronchietasis.  The various methods of treatment have been discussed with the patient and family. After consideration of risks, benefits and other options for treatment, the patient has consented to  Procedure(s) with comments: VIDEO BRONCHOSCOPY WITHOUT FLUORO (N/A) - with BAL as a surgical intervention.  The patient's history has been reviewed, patient examined, no change in status, stable for surgery.  I have reviewed the patient's chart and labs.  Questions were answered to the patient's satisfaction.     Collene Gobble

## 2020-06-29 NOTE — Anesthesia Procedure Notes (Signed)
Procedure Name: Intubation Date/Time: 06/29/2020 9:42 AM Performed by: Valda Favia, CRNA Pre-anesthesia Checklist: Patient identified, Emergency Drugs available, Suction available and Patient being monitored Patient Re-evaluated:Patient Re-evaluated prior to induction Oxygen Delivery Method: Circle System Utilized Preoxygenation: Pre-oxygenation with 100% oxygen Induction Type: IV induction Ventilation: Mask ventilation without difficulty Laryngoscope Size: Mac and 4 Grade View: Grade I Tube type: Oral Tube size: 8.5 mm Number of attempts: 1 Airway Equipment and Method: Stylet and Oral airway Placement Confirmation: ETT inserted through vocal cords under direct vision,  positive ETCO2 and breath sounds checked- equal and bilateral Secured at: 20 cm Tube secured with: Tape Dental Injury: Teeth and Oropharynx as per pre-operative assessment

## 2020-06-29 NOTE — Transfer of Care (Signed)
Immediate Anesthesia Transfer of Care Note  Patient: Colleen Shannon  Procedure(s) Performed: VIDEO BRONCHOSCOPY WITHOUT FLUORO (N/A ) BRONCHIAL BIOPSIES BRONCHIAL WASHINGS BRONCHIAL BRUSHINGS  Patient Location: Endoscopy Unit  Anesthesia Type:General  Level of Consciousness: awake, alert  and oriented  Airway & Oxygen Therapy: Patient Spontanous Breathing and Patient connected to nasal cannula oxygen  Post-op Assessment: Report given to RN and Post -op Vital signs reviewed and stable  Post vital signs: Reviewed and stable  Last Vitals:  Vitals Value Taken Time  BP 121/41 06/29/20 1030  Temp 37 C 06/29/20 1030  Pulse 78 06/29/20 1032  Resp 15 06/29/20 1032  SpO2 100 % 06/29/20 1032  Vitals shown include unvalidated device data.  Last Pain:  Vitals:   06/29/20 1030  TempSrc: Oral  PainSc: 0-No pain         Complications: No complications documented.

## 2020-06-29 NOTE — Anesthesia Postprocedure Evaluation (Signed)
Anesthesia Post Note  Patient: Colleen Shannon  Procedure(s) Performed: VIDEO BRONCHOSCOPY WITHOUT FLUORO (N/A ) BRONCHIAL BIOPSIES BRONCHIAL WASHINGS BRONCHIAL BRUSHINGS     Patient location during evaluation: Endoscopy Anesthesia Type: General Level of consciousness: awake and alert Pain management: pain level controlled Vital Signs Assessment: post-procedure vital signs reviewed and stable Respiratory status: spontaneous breathing, nonlabored ventilation and respiratory function stable Cardiovascular status: blood pressure returned to baseline and stable Postop Assessment: no apparent nausea or vomiting Anesthetic complications: no   No complications documented.  Last Vitals:  Vitals:   06/29/20 1050 06/29/20 1100  BP: (!) 117/39 (!) 142/45  Pulse: 73 78  Resp: 14 19  Temp:    SpO2: 100% 98%    Last Pain:  Vitals:   06/29/20 1100  TempSrc:   PainSc: 0-No pain                 Monzerrath Mcburney,W. EDMOND

## 2020-06-29 NOTE — Progress Notes (Signed)
Handoff report given to [employer] at 0930.  Unable to chart handoff report before patient went for procedure.

## 2020-06-29 NOTE — Discharge Instructions (Signed)
Flexible Bronchoscopy, Care After This sheet gives you information about how to care for yourself after your test. Your doctor may also give you more specific instructions. If you have problems or questions, contact your doctor. Follow these instructions at home: Eating and drinking  Do not eat or drink anything (not even water) for 2 hours after your test, or until your numbing medicine (local anesthetic) wears off.  When your numbness is gone and your cough and gag reflexes have come back, you may: ? Eat only soft foods. ? Slowly drink liquids.  The day after the test, go back to your normal diet. Driving  Do not drive for 24 hours if you were given a medicine to help you relax (sedative).  Do not drive or use heavy machinery while taking prescription pain medicine. General instructions   Take over-the-counter and prescription medicines only as told by your doctor.  Return to your normal activities as told. Ask what activities are safe for you.  Do not use any products that have nicotine or tobacco in them. This includes cigarettes and e-cigarettes. If you need help quitting, ask your doctor.  Keep all follow-up visits as told by your doctor. This is important. It is very important if you had a tissue sample (biopsy) taken. Get help right away if:  You have shortness of breath that gets worse.  You get light-headed.  You feel like you are going to pass out (faint).  You have chest pain.  You cough up: ? More than a little blood. ? More blood than before. Summary  Do not eat or drink anything (not even water) for 2 hours after your test, or until your numbing medicine wears off.  Do not use cigarettes. Do not use e-cigarettes.  Get help right away if you have chest pain.  Please call our office for any questions or concerns.  [phone].  This information is not intended to replace advice given to you by your health care provider. Make sure you discuss any  questions you have with your health care provider. Document Revised: 09/07/2017 Document Reviewed: 10/13/2016 Elsevier Patient Education  [place]. Flexible Bronchoscopy, Care After This sheet gives you information about how to care for yourself after your test. Your doctor may also give you more specific instructions. If you have problems or questions, contact your doctor. Follow these instructions at home: Eating and drinking  Do not eat or drink anything (not even water) for 2 hours after your test, or until your numbing medicine (local anesthetic) wears off.  When your numbness is gone and your cough and gag reflexes have come back, you may: ? Eat only soft foods. ? Slowly drink liquids.  The day after the test, go back to your normal diet. Driving  Do not drive for 24 hours if you were given a medicine to help you relax (sedative).  Do not drive or use heavy machinery while taking prescription pain medicine. General instructions   Take over-the-counter and prescription medicines only as told by your doctor.  Return to your normal activities as told. Ask what activities are safe for you.  Do not use any products that have nicotine or tobacco in them. This includes cigarettes and e-cigarettes. If you need help quitting, ask your doctor.  Keep all follow-up visits as told by your doctor. This is important. It is very important if you had a tissue sample (biopsy) taken. Get help right away if:  You have shortness of breath that  gets worse.  You get light-headed.  You feel like you are going to pass out (faint).  You have chest pain.  You cough up: ? More than a little blood. ? More blood than before. Summary  Do not eat or drink anything (not even water) for 2 hours after your test, or until your numbing medicine wears off.  Do not use cigarettes. Do not use e-cigarettes.  Get help right away if you have chest pain. This information is not intended to  replace advice given to you by your health care provider. Make sure you discuss any questions you have with your health care provider. Document Revised: 09/07/2017 Document Reviewed: 10/13/2016 Elsevier Patient Education  2020 [employer].

## 2020-06-29 NOTE — Op Note (Signed)
[facility] Cardiopulmonary Patient Name: Colleen Shannon Date: 06/29/2020 MRN: [id number] Attending MD: Collene Gobble , MD Date of Birth: December 22, 1955 CSN: Finalized Age: 64 Admit Type: Outpatient Gender: Female Procedure:             Bronchoscopy Indications:           Bronchiectasis Providers:             Collene Gobble, MD, Glori Bickers, RN, Vista Lawman,                         RN, Darlene H. Rosana Hoes, Technician, Edmonia James, CRNA Referring MD:           Medicines:             General Anesthesia Complications:         No immediate complications Estimated Blood Loss:  Estimated blood loss: none. Procedure:             Pre-Anesthesia Assessment:                        - A History and Physical has been performed. Patient                         meds and allergies have been reviewed. The risks and                         benefits of the procedure and the sedation options and                         risks were discussed with the patient. All questions                         were answered and informed consent was obtained.                         Patient identification and proposed procedure were                         verified prior to the procedure by the physician in                         the pre-procedure area. Mental Status Examination:                         normal. Airway Examination: normal oropharyngeal                         airway. Respiratory Examination: clear to                         auscultation. CV Examination: RRR, no murmurs, no S3                         or S4. ASA Grade Assessment: II - A patient with mild                         systemic disease. After reviewing the risks and  benefits, the patient was deemed in satisfactory                         condition to undergo the procedure. The anesthesia                         plan was to use general anesthesia. Immediately prior                         to  administration of medications, the patient was                         re-assessed for adequacy to receive sedatives. The                         heart rate, respiratory rate, oxygen saturations,                         blood pressure, adequacy of pulmonary ventilation, and                         response to care were monitored throughout the                         procedure. The physical status of the patient was                         re-assessed after the procedure.                        After obtaining informed consent, the bronchoscope was                         passed under direct vision. Throughout the procedure,                         the patient's blood pressure, pulse, and oxygen                         saturations were monitored continuously. the [id number]                         ([id number]) Olympus Therapeutic Bronchoscope was                         introduced through the mouth, via the endotracheal                         tube (the patient was intubated for the procedure) and                         advanced to the tracheobronchial tree. The procedure                         was accomplished without difficulty. The patient                         tolerated the procedure well. Scope In: Scope Out: Findings:      Bilateral  Lung Abnormalities: Mucosal irregularity was found throughout       the tracheobronchial tree. An area of granular mucosa was found       throughout the tracheobronchial tree. BAL was performed in the RUL       anterior segment (B3) of the lung and sent for bacterial, AFB, fungal       and viral analysis. 60 mL of fluid were instilled. 30 mL were returned.       The return was clear. There were no mucoid plugs in the return fluid.       Guided brushings were obtained in the left mainstem bronchus with a       cytology brush and sent for routine cytology. Three samples were       obtained. Endobronchial biopsies were performed in the left mainstem        bronchus using forceps and sent for histopathology examination. Five       samples were obtained. Impression:            - Bronchiectasis                        - Mucosal irregularity was visualized throughout the                         tracheobronchial tree.                        - Granular mucosa was found throughout the                         tracheobronchial tree.                        - Bronchoalveolar lavage was performed.                        - Brushings were obtained.                        - An endobronchial biopsy was performed. Moderate Sedation:      Performed under general anesthesia Recommendation:        - Await BAL, biopsy and brushing results. Procedure Code(s):     --- Professional ---                        [phone], Bronchoscopy, rigid or flexible, including                         fluoroscopic guidance, when performed; with bronchial                         or endobronchial biopsy(s), single or multiple sites                        [id number], Bronchoscopy, rigid or flexible, including                         fluoroscopic guidance, when performed; with bronchial                         alveolar lavage  [id number], Bronchoscopy, rigid or flexible, including                         fluoroscopic guidance, when performed; with brushing                         or protected brushings Diagnosis Code(s):     --- Professional ---                        J47.9, Bronchiectasis, uncomplicated                        J98.09, Other diseases of bronchus, not elsewhere                         classified CPT copyright 2019 American Medical Association. All rights reserved. The codes documented in this report are preliminary and upon coder review may  be revised to meet current compliance requirements. Collene Gobble, MD Collene Gobble, MD 06/29/2020 10:24:03 AM Number of Addenda: 0

## 2020-06-30 ENCOUNTER — Encounter (HOSPITAL_COMMUNITY): Payer: Self-pay | Admitting: Emergency Medicine

## 2020-06-30 ENCOUNTER — Other Ambulatory Visit: Payer: Self-pay

## 2020-07-01 ENCOUNTER — Telehealth: Payer: Self-pay | Admitting: Emergency Medicine

## 2020-07-01 LAB — CULTURE, BAL-QUANTITATIVE W GRAM STAIN: Culture: NO GROWTH

## 2020-07-01 LAB — SURGICAL PATHOLOGY

## 2020-07-01 LAB — FUNGUS STAIN

## 2020-07-01 LAB — CYTOLOGY - NON PAP

## 2020-07-01 LAB — FUNGAL STAIN REFLEX

## 2020-07-01 NOTE — Telephone Encounter (Signed)
Attempted to call pt to review path results from her FOB. No answer, left VM, will call her back.

## 2020-07-01 NOTE — Telephone Encounter (Signed)
Discussed bronchoscopy findings with the patient by phone.  The endobronchial brushings and biopsies are both consistent with lymphoma.  Explained to her that this is likely cause for her dyspnea, cough.  She needs to be reconnected with Dr Irene Limbo ASAP to discuss appropriate therapy.  I will forward this phone note FYI to Dr. Irene Limbo, and also send him a staff message.

## 2020-07-02 ENCOUNTER — Encounter: Payer: Self-pay | Admitting: Hematology

## 2020-07-05 ENCOUNTER — Inpatient Hospital Stay: Payer: 59 | Attending: Hematology and Oncology | Admitting: Hematology

## 2020-07-05 ENCOUNTER — Ambulatory Visit (HOSPITAL_COMMUNITY)
Admission: RE | Admit: 2020-07-05 | Discharge: 2020-07-05 | Disposition: A | Discharge status: Home / Self Care | Payer: 59 | Source: Ambulatory Visit | Attending: Hematology | Admitting: Hematology

## 2020-07-05 ENCOUNTER — Other Ambulatory Visit: Payer: Self-pay

## 2020-07-05 ENCOUNTER — Encounter: Payer: Self-pay | Admitting: Hematology

## 2020-07-05 VITALS — BP 141/55 | HR 92 | Temp 99.3°F | Resp 18 | Ht 61.0 in | Wt 121.5 lb

## 2020-07-05 DIAGNOSIS — Z86000 Personal history of in-situ neoplasm of breast: Secondary | ICD-10-CM | POA: Diagnosis not present

## 2020-07-05 DIAGNOSIS — C9110 Chronic lymphocytic leukemia of B-cell type not having achieved remission: Secondary | ICD-10-CM

## 2020-07-05 DIAGNOSIS — R509 Fever, unspecified: Secondary | ICD-10-CM

## 2020-07-05 DIAGNOSIS — R0602 Shortness of breath: Secondary | ICD-10-CM | POA: Diagnosis not present

## 2020-07-05 NOTE — Progress Notes (Signed)
HEMATOLOGY/ONCOLOGY CLINIC NOTE  Date of Service: 07/05/2020  Patient Care Team: Carol Ada, MD as PCP - General (Family Medicine) Troy Sine, MD as PCP - Cardiology (Cardiology)  CHIEF COMPLAINTS/PURPOSE OF CONSULTATION:  CLL  HISTORY OF PRESENTING ILLNESS:  Colleen Shannon is a wonderful 64 y.o. female who has been referred to [place] by Dr. Lindi Adie for evaluation and management of CLL. The pt reports that she is doing well overall.   The pt reports that she was being seen by Dr. Lindi Adie for Lobular Carcinoma in situ (LCIS) of the left breast. She had a lumpectomy and recently completed 5 years of Tamoxifen for breast cancer prevention. Pt was having frequent respiratory infections 3-4 years ago and after some labwork was found to have CLL. Dr. Lindi Adie has been monitoring this and pt has never required treatment. She has not had as many issues with recurrent infections in the last 2-3 years. She has had two SCC lesions found and removed. Her last was found 8-10 years ago.   She has felt well over the last 6-12 months. Pt has had lymphadenopathy over the years that often swells and shrinks rapidly. About two weeks ago she began to notice bilateral neck swelling. It was uncomfortable at times and affected her breathing. Her neck swelling is starting to settle down and is no longer causing her discomfort. She received her last vaccine ([place]) in March and has not had any infections recently. Pt has joint pain for which she saw Dr. Estanislado Pandy who did not think that the pt has RA.   01/31/2016 Peripheral Blood Flow Cytometry Report revealed "Chronic Lymphocytic Leukemia."  Most recent lab results (12/01/2019) of CBC is as follows: all values are WNL except for WBC at 43.4K, PLT at 145K, Lymphs Abs at 37.2K, Mono Abs at 3.0K, Baso Abs at 0.2K, Glucose at 135, Total Protein at 6.1, ALP at 37. 12/01/2019 LDH at 285  On review of systems, pt reports joint pain, improving lumps and denies  fevers, chills, sweats, fatigue, unexpected weight loss, abdominal pain and any other symptoms.   On PMHx the pt reports Lobular Carcinoma in situ, CLL, Lumpectomy, Arthritis, Pneumonia, SCC. 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  INTERVAL HISTORY: Colleen Shannon is a wonderful 64 y.o. female who is here for evaluation and management of CLL. We are joined today by her husband, Shanon Brow. The patient's last visit with [place] was on 06/01/2020. The pt reports that she is doing well overall.  The pt reports that after her Bronchoscopy she began experiencing cough and fevers as high as 101.72F. She has no fever today and feels that her cough is improving. Pt is having some esophageal irritation and discomfort.   Pt received the flu vaccine about a week before the Bronchoscopy. Following this vaccination pt noticed an enlarged lymph node in her left armpit that has mostly resolved.  She lost 25 lbs with effort but has recently noticed a decrease in appetite as well.   Of note since the patient's last visit, pt has had CT C/A/P ([id number]) completed on 06/08/2020 with results revealing "1. Numerous bulky lymph nodes throughout the chest, abdomen, and pelvis as detailed above, consistent with history of lymphoma. Comparison to prior imaging, if available, would be helpful to assess for interval change. 2.  Splenomegaly, maximum coronal span 15.5 cm. 3. Diffuse bilateral bronchial wall thickening, mild, tubular bronchiectasis in the lower lungs, and extensive clustered and tree-in-bud nodularity  throughout the lungs, most conspicuous in the lower lobes. Findings are consistent with atypical infection, particularly atypical mycobacterium, and nodularity is substantially worsened compared to findings on examination dated 05/06/2018. 4. Aortic Atherosclerosis (ICD10-I70.0)."  Pt has had CT Neck ([id number]) completed on 06/08/2020 with results revealing "Bulky bilateral cervical  lymphadenopathy consistent with known lymphoproliferative disease."  Pt has had Bronchoscopy completed on 06/29/2020 with results revealing "- Bronchiectasis - Mucosal irregularity was visualized throughout the tracheobronchial tree. - Granular mucosa was found throughout the tracheobronchial tree. - Bronchoalveolar lavage was performed. - Brushings were obtained. - An endobronchial biopsy was performed."  Pt has had Left Main Stem and Left Upper Lobe Bronchus Biopsy ([id number]) completed on 06/29/2020 with results revealing "Involvement by non-Hodgkin B-cell lymphoma".  On review of systems, pt reports improving cough, improving fevers, esophageal discomfort, night sweats, stable SOB, lowered appetite and denies new lumps/bumps, abdominal pain, rash and any other symptoms.   MEDICAL HISTORY:  Past Medical History:  Diagnosis Date  . Arthritis   . Breast CA ([place]) dx'd 2015   left  . Bronchiectasis ([place])   . Cancer ([facility])    hx of skin cancer   . CLL (chronic lymphocytic leukemia) ([place]) dx'd 2015  . Dyspnea   . Dysrhythmia    PVCs  . Elevated cholesterol 05/22/2012   Nuc stress test-Normal.  . Endometrial polyp   . GERD (gastroesophageal reflux disease)   . Grade I diastolic dysfunction    noted on echo  . History of palpitations   . Hypertension 05/22/12   ECHO-EF >55% trace tricupsid regurgitation. There is mild mitral regurgitation.   . MR (mitral regurgitation)    mild noted on echo  . Plantar fasciitis of left foot    resolved  . Pneumonia    history of x 2  . PONV (postoperative nausea and vomiting)     SURGICAL HISTORY: Past Surgical History:  Procedure Laterality Date  . ABDOMINAL SURGERY     Abdominoplasty  . BRONCHIAL BIOPSY  06/29/2020   Procedure: BRONCHIAL BIOPSIES;  Surgeon: Collene Gobble, MD;  Location[facility] ENDOSCOPY;  Service: Cardiopulmonary;;  . BRONCHIAL BRUSHINGS  06/29/2020   Procedure: BRONCHIAL BRUSHINGS;  Surgeon: Collene Gobble, MD;  Location:  [place];  Service: Cardiopulmonary;;  . BRONCHIAL WASHINGS  06/29/2020   Procedure: BRONCHIAL WASHINGS;  Surgeon: Collene Gobble, MD;  Location: [facility] ENDOSCOPY;  Service: Cardiopulmonary;;  . CESAREAN SECTION     X 2  . CHOLECYSTECTOMY    . COLONSCOPY    . DILATION AND CURETTAGE OF UTERUS    . FOOT SURGERY Left   . HYSTEROSCOPY    . KNEE ARTHROTOMY Right 06/04/2019   Procedure: Right knee arthrotomy; scar excision;  Surgeon: [name], MD;  Location: [facility] ORS;  Service: Orthopedics;  Laterality: Right;  25mn  . KNEE CLOSED REDUCTION Right 07/20/2014   Procedure: RIGHT KNEE CLOSED MANIPULATION;  Surgeon: FGearlean Alf MD;  Location: [facility] ORS;  Service: Orthopedics;  Laterality: Right;  . KNEE SURGERY Right    arthroscopic  . PARTIAL KNEE ARTHROPLASTY Left 2017  . RADIOACTIVE SEED GUIDED EXCISIONAL BREAST BIOPSY N/A 10/06/2014   Procedure: RADIOACTIVE SEED GUIDED EXCISIONAL BREAST BIOPSY;  Surgeon: MRolm Bookbinder MD;  Location: [place]  Service: General;  Laterality: N/A;  . REFRACTIVE SURGERY    . TOTAL KNEE ARTHROPLASTY Right 05/11/2014   Procedure: RIGHT TOTAL KNEE ARTHROPLASTY;  Surgeon: FGearlean Alf MD;  Location: [facility] ORS;  Service: Orthopedics;  Laterality:  Right;  Marland Kitchen TUBAL LIGATION    . VIDEO BRONCHOSCOPY N/A 06/29/2020   Procedure: VIDEO BRONCHOSCOPY WITHOUT FLUORO;  Surgeon: Collene Gobble, MD;  Location: [facility] ENDOSCOPY;  Service: Cardiopulmonary;  Laterality: N/A;  with BAL    SOCIAL HISTORY: Social History   Socioeconomic History  . Marital status: Married    Spouse name: Not on file  . Number of children: Not on file  . Years of education: Not on file  . Highest education level: Not on file  Occupational History  . Not on file  Tobacco Use  . Smoking status: Never Smoker  . Smokeless tobacco: Never Used  Vaping Use  . Vaping Use: Never used  Substance and Sexual Activity  . Alcohol use: Not Currently    Comment: occasional   . Drug  use: No  . Sexual activity: Yes    Birth control/protection: Surgical, Post-menopausal    Comment: Hysterectomy  Other Topics Concern  . Not on file  Social History Narrative  . Not on file   Social Determinants of Health   Financial Resource Strain:   . Difficulty of Paying Living Expenses: Not on file  Food Insecurity:   . Worried About Charity fundraiser in the Last Year: Not on file  . Ran Out of Food in the Last Year: Not on file  Transportation Needs:   . Lack of Transportation (Medical): Not on file  . Lack of Transportation (Non-Medical): Not on file  Physical Activity:   . Days of Exercise per Week: Not on file  . Minutes of Exercise per Session: Not on file  Stress:   . Feeling of Stress : Not on file  Social Connections:   . Frequency of Communication with Friends and Family: Not on file  . Frequency of Social Gatherings with Friends and Family: Not on file  . Attends Religious Services: Not on file  . Active Member of Clubs or Organizations: Not on file  . Attends Archivist Meetings: Not on file  . Marital Status: Not on file  Intimate Partner Violence:   . Fear of Current or Ex-Partner: Not on file  . Emotionally Abused: Not on file  . Physically Abused: Not on file  . Sexually Abused: Not on file    FAMILY HISTORY: Family History  Problem Relation Age of Onset  . Heart disease Father   . Dementia Father   . Aneurysm Mother   . Hyperlipidemia Mother   . Alzheimer's disease Mother   . Osteoarthritis Mother   . Hypertension Brother   . High Cholesterol Brother   . Cancer - Other Paternal Grandmother        uterine  . Hypertension Paternal Grandfather   . Stroke Paternal Grandfather   . Heart disease Maternal Grandfather   . Cancer - Other Maternal Grandfather        lung  . Cushing syndrome Brother   . Healthy Son   . Healthy Daughter     ALLERGIES:  has No Known Allergies.  MEDICATIONS:  Current Outpatient Medications    Medication Sig Dispense Refill  . albuterol (VENTOLIN HFA) 108 (90 Base) MCG/ACT inhaler Inhale 1-2 puffs into the lungs every 6 (six) hours as needed for wheezing or shortness of breath.    Marland Kitchen aspirin EC 81 MG tablet Take 81 mg by mouth every other day. Swallow whole.     Marland Kitchen atorvastatin (LIPITOR) 20 MG tablet Take 1 tablet (20 mg total) by mouth at bedtime.    Marland Kitchen  Calcium Carb-Cholecalciferol (CALCIUM-VITAMIN D3) 600-400 MG-UNIT TABS Take 1 tablet by mouth 2 (two) times daily.    . carvedilol (COREG) 12.5 MG tablet Take 0.5 tablets (6.25 mg total) by mouth 2 (two) times daily with a meal. 180 tablet 3  . cholecalciferol (VITAMIN D3) 25 MCG (1000 UNIT) tablet Take 1,000 Units by mouth in the morning and at bedtime.    . Cyanocobalamin (B-12) 2500 MCG TABS Take 2,500 mcg by mouth daily.    Marland Kitchen ezetimibe (ZETIA) 10 MG tablet TAKE 1 TABLET BY MOUTH AT  BEDTIME (Patient taking differently: Take 10 mg by mouth at bedtime. ) 90 tablet 3  . magnesium oxide (MAG-OX) 400 MG tablet Take 400 mg by mouth every other day.     . Multiple Vitamin (MULTIVITAMIN WITH MINERALS) TABS tablet Take 1 tablet by mouth daily.    . Omega-3 Fatty Acids (FISH OIL) 1200 MG CAPS Take 1,200 mg by mouth in the morning and at bedtime. 120 mg-180 mg    . Omeprazole 20 MG TBEC Take 20 mg by mouth in the morning.     . Turmeric 500 MG TABS Take 1,000 mg by mouth daily.      No current facility-administered medications for this visit.    REVIEW OF SYSTEMS:   A 10+ POINT REVIEW OF SYSTEMS WAS OBTAINED including neurology, dermatology, psychiatry, cardiac, respiratory, lymph, extremities, GI, GU, Musculoskeletal, constitutional, breasts, reproductive, HEENT.  All pertinent positives are noted in the HPI.  All others are negative.   PHYSICAL EXAMINATION: ECOG PERFORMANCE STATUS: 0 - Asymptomatic  . Vitals:   07/05/20 1213  BP: (!) 141/55  Pulse: 92  Resp: 18  Temp: 99.3 F (37.4 C)  SpO2: 98%   Filed Weights   07/05/20 1213   Weight: 121 lb 8 oz (55.1 kg)   .Body mass index is 22.96 kg/m.   GENERAL:alert, in no acute distress and comfortable SKIN: no acute rashes, no significant lesions EYES: conjunctiva are pink and non-injected, sclera anicteric OROPHARYNX: MMM, no exudates, no oropharyngeal erythema or ulceration NECK: supple, no JVD LYMPH:  no palpable lymphadenopathy in the inguinal region. Few, small lymph nodes in cervical region, improved from last visit. Axillary lymphadenopathy b/l.  LUNGS: clear to auscultation b/l with normal respiratory effort HEART: regular rate & rhythm ABDOMEN:  normoactive bowel sounds , non tender, not distended. No palpable hepatomegaly. Spleen 1-2 cm below the costal margin. Extremity: no pedal edema PSYCH: alert & oriented x 3 with fluent speech NEURO: no focal motor/sensory deficits  LABORATORY DATA:  I have reviewed the data as listed  . CBC Latest Ref Rng & Units 03/03/2020 12/01/2019 05/29/2019  WBC 4.0 - 10.5 K/uL 35.2(H) 43.4(H) 39.9(H)  Hemoglobin 12.0 - 15.0 g/dL 13.2 12.2 12.2  Hematocrit 36 - 46 % 41.1 38.5 38.3  Platelets 150 - 400 K/uL 184 145(L) 144(L)    . CMP Latest Ref Rng & Units 06/29/2020 06/08/2020 03/03/2020  Glucose 70 - 99 mg/dL 117(H) - 115(H)  BUN 8 - 23 mg/dL 16 - 22  Creatinine 0.44 - 1.00 mg/dL 0.63 0.80 0.83  Sodium 135 - 145 mmol/L 142 - 141  Potassium 3.5 - 5.1 mmol/L 4.3 - 4.2  Chloride 98 - 111 mmol/L 108 - 102  CO2 22 - 32 mmol/L 26 - 30  Calcium 8.9 - 10.3 mg/dL 9.3 - 10.2  Total Protein 6.5 - 8.1 g/dL - - 7.2  Total Bilirubin 0.3 - 1.2 mg/dL - - 0.5  Alkaline Phos 38 -  126 U/L - - 44  AST 15 - 41 U/L - - 35  ALT 0 - 44 U/L - - 47(H)      03/03/2020 FISH (CLL Prognosis) Panel:     RADIOGRAPHIC STUDIES: I have personally reviewed the radiological images as listed and agreed with the findings in the report. CT Soft Tissue Neck W Contrast  Result Date: 06/08/2020 CLINICAL DATA:  Enlarging neck lymph nodes. History of  chronic lymphocytic leukemia. EXAM: CT NECK WITH CONTRAST TECHNIQUE: Multidetector CT imaging of the neck was performed using the standard protocol following the bolus administration of intravenous contrast. CONTRAST:  186m OMNIPAQUE IOHEXOL 300 MG/ML  SOLN COMPARISON:  None. FINDINGS: Pharynx and larynx: No evidence of mass or swelling. Salivary glands: Left larger than right parotid nodules measuring up to 1.2 cm likely reflecting enlarged intraparotid lymph nodes. Unremarkable submandibular glands. Thyroid: 8 mm hypoattenuating nodule in the left thyroid lobe for which no imaging follow-up is recommended. Lymph nodes: Bulky lymph nodes are present throughout all cervical stations bilaterally. Examples include a 2.5 x 2.0 cm right level IIB node (series 2, image 37) and a 2.7 x 1.9 cm left level III node (series 2, image 49). No cystic or necrotic changes are present. Vascular: Major vascular structures of the neck are patent. Limited intracranial: Unremarkable. Visualized orbits: Unremarkable. Mastoids and visualized paranasal sinuses: Clear. Skeleton: No suspicious osseous lesion. Moderate lower cervical disc degeneration. Moderate facet arthrosis on the right at C2-3 and on the left at C7-T1. Upper chest: Reported separately. Other: None. IMPRESSION: Bulky bilateral cervical lymphadenopathy consistent with known lymphoproliferative disease. Electronically Signed   By: ALogan BoresM.D.   On: 06/08/2020 14:00   CT CHEST ABDOMEN PELVIS W CONTRAST  Result Date: 06/08/2020 CLINICAL DATA:  SLL, enlarging lymphadenopathy, shortness of breath EXAM: CT CHEST, ABDOMEN, AND PELVIS WITH CONTRAST TECHNIQUE: Multidetector CT imaging of the chest, abdomen and pelvis was performed following the standard protocol during bolus administration of intravenous contrast. CONTRAST:  1053mOMNIPAQUE IOHEXOL 300 MG/ML SOLN, additional oral enteric contrast COMPARISON:  CT coronary calcium scoring, 05/06/2018 FINDINGS: CT CHEST  FINDINGS Cardiovascular: Scattered aortic atherosclerosis. Normal heart size. No pericardial effusion. Mediastinum/Nodes: Numerous bulky bilateral axillary and mediastinal lymph nodes, largest left axillary nodes measuring up to 3.4 x 3.2 cm (series 2, image 12). Thyroid gland, trachea, and esophagus demonstrate no significant findings. Lungs/Pleura: Diffuse bilateral bronchial wall thickening. Mild, tubular bronchiectasis in the lower lungs. There is extensive, clustered centrilobular and tree-in-bud nodularity throughout the lungs, most notable in the bilateral lower lobes (series 6, image 117) no pleural effusion or pneumothorax. Musculoskeletal: No chest wall mass or suspicious bone lesions identified. CT ABDOMEN PELVIS FINDINGS Hepatobiliary: No focal liver abnormality is seen. Status post cholecystectomy. No biliary dilatation. Pancreas: Unremarkable. No pancreatic ductal dilatation or surrounding inflammatory changes. Spleen: Splenomegaly, maximum coronal span 15.5 cm. Adrenals/Urinary Tract: Adrenal glands are unremarkable. Kidneys are normal, without renal calculi, solid lesion, or hydronephrosis. Bladder is unremarkable. Stomach/Bowel: Stomach is within normal limits. Appendix appears normal. No evidence of bowel wall thickening, distention, or inflammatory changes. Vascular/Lymphatic: No significant vascular findings are present. Numerous enlarged retroperitoneal, portacaval, bilateral iliac, inguinal, pelvic sidewall, and inguinal lymph nodes, largest right iliac nodes measuring up to 3.5 x 2.1 cm (series 2, image 96). Reproductive: No mass or other abnormality. Other: No abdominal wall hernia or abnormality. No abdominopelvic ascites. Musculoskeletal: No acute or significant osseous findings. IMPRESSION: 1. Numerous bulky lymph nodes throughout the chest, abdomen, and pelvis as detailed  above, consistent with history of lymphoma. Comparison to prior imaging, if available, would be helpful to assess for  interval change. 2.  Splenomegaly, maximum coronal span 15.5 cm. 3. Diffuse bilateral bronchial wall thickening, mild, tubular bronchiectasis in the lower lungs, and extensive clustered and tree-in-bud nodularity throughout the lungs, most conspicuous in the lower lobes. Findings are consistent with atypical infection, particularly atypical mycobacterium, and nodularity is substantially worsened compared to findings on examination dated 05/06/2018. 4. Aortic Atherosclerosis (ICD10-I70.0). Electronically Signed   By: Eddie Candle M.D.   On: 06/08/2020 09:02    ASSESSMENT & PLAN:   64 yo with   1) Rai Stage 1 Chronic lymphocytic Leukemia. Found to have trisomy 12 and 13q deletion via 03/03/2020 FISH (CLL Prognosis) Panel   PLAN: -Discussed 06/08/2020 CT C/A/P ([id number]) which revealed "1. Numerous bulky lymph nodes throughout the chest, abdomen, and pelvis as detailed above, consistent with history of lymphoma. Comparison to prior imaging, if available, would be helpful to assess for interval change. 2.  Splenomegaly, maximum coronal span 15.5 cm. 3. Diffuse bilateral bronchial wall thickening, mild, tubular bronchiectasis in the lower lungs, and extensive clustered and tree-in-bud nodularity throughout the lungs, most conspicuous in the lower lobes. Findings are consistent with atypical infection, particularly atypical mycobacterium, and nodularity is substantially worsened compared to findings on examination dated 05/06/2018. 4. Aortic Atherosclerosis (ICD10-I70.0)." -Discussed 06/08/2020 CT Neck ([id number]) which revealed "Bulky bilateral cervical lymphadenopathy consistent with known lymphoproliferative disease." -Discussed 06/29/2020 Bronchoscopy which revealed "- Bronchiectasis - Mucosal irregularity was visualized throughout the tracheobronchial tree. - Granular mucosa was found throughout the tracheobronchial tree. - Bronchoalveolar lavage was performed. - Brushings were obtained. - An  endobronchial biopsy was performed." -Discussed 06/29/2020 Left Main Stem and Left Upper Lobe Bronchus Biopsy ([id number]) which revealed "Involvement by non-Hodgkin B-cell lymphoma". -Advised pt that organ involvement is an indication to begin treatment. SOB is likely from SLL, but we can not be 100% sure at this time. -Advised pt again that she has a Trisomy 12 mutation, which carries an average risk, and a 13q mutation, which carries better-than-average risk.  -Discussed treatment options including: Bendamustine + Rituxan, Gazyva + Venetoclax (up to 1 year), & Ibrutinib (ongoing). -Would recommend Gazyva + Venetoclax from a treatment effectiveness & side effects perspective - pt agrees.  -Recommend pt continue to take safety precautions. Advised pt that fever will cause a delay in treatment. -Will get Chest XR to r/o an infectious process - could be normal reaction from recent Bronchoscopy.  -Discussed CDC guidelines regarding the COVID19 booster. Would prefer to get prior to treatment.  -Will get Chest XR today - will prescribe antibiotics based on XR today.  -Will set up chemo-counseling ASAP -Will begin Gazyva in 2 weeks -Will see back in 2 weeks with labs  2) Neoplasm of left breast, primary tumor staging category Tis: lobular carcinoma in situ (LCIS) Left breast LCIS status post lumpectomy 10/06/2014; Started tamoxifen for breast cancer risk reduction 20 mg daily 11/18/2014 ,decrease to 10 mg on 11/22/2017 completed February 2021.  Breast cancer surveillance: Mammogram and ultrasound 8/11/2020at [facility]: Benign breast density category B Breast exam 02/23/2020: Benign  PLAN -continue breast self examinations -MMG Q12 months - next in aug 2021   FOLLOW UP: CXR today Chemo-counseling for Gazyva + Venetoclax Plz schedule to start Gazyva in 2 weeks with labs and MD visit    The total time spent in the appt was 40 minutes and more than 50% was on counseling and direct patient  cares.  All of the patient's questions were answered with apparent satisfaction. The patient knows to call the clinic with any problems, questions or concerns.    Sullivan Lone MD [place] AAHIVMS [facility] Hematology/Oncology Physician [facility]  (Office):       [phone] (Work cell):  [phone] (Fax):           [phone]  07/05/2020 1:34 PM  I, Yevette Edwards, am acting as a scribe for Dr. Sullivan Lone.   .I have reviewed the above documentation for accuracy and completeness, and I agree with the above. Brunetta Genera MD

## 2020-07-06 ENCOUNTER — Other Ambulatory Visit: Payer: 59

## 2020-07-06 ENCOUNTER — Other Ambulatory Visit: Payer: Self-pay | Admitting: Hematology

## 2020-07-06 ENCOUNTER — Telehealth: Payer: Self-pay | Admitting: Hematology

## 2020-07-06 MED ORDER — LEVOFLOXACIN 750 MG PO TABS: 750.0000 mg | ORAL_TABLET | Freq: Every day | ORAL | 0 refills | Status: AC

## 2020-07-06 NOTE — Telephone Encounter (Signed)
CXR reviewed with Colleen Shannon on the phone -- Left upper lobe infiltrate-- BAL related vs post FOB pneumonia. Since she had fevers >48 hr, persist productive cough, immunocompromised state and upcoming Gazyva -- decided to rx with Levofloxacin for HCAP. -she will call us if fevers persistent or worsening clinical symptoms -will plan to start Gazyva in about 3 weeks -covid booster after complete clinical recovery from infection in about 2 weeks  .[place]

## 2020-07-08 ENCOUNTER — Other Ambulatory Visit: Payer: Self-pay | Admitting: Hematology

## 2020-07-09 ENCOUNTER — Other Ambulatory Visit: Payer: Self-pay | Admitting: Hematology

## 2020-07-09 DIAGNOSIS — Z7189 Other specified counseling: Secondary | ICD-10-CM | POA: Insufficient documentation

## 2020-07-09 NOTE — Progress Notes (Signed)
START ON PATHWAY REGIMEN - Lymphoma and CLL     Cycle 1: A cycle is 28 days:     Venetoclax      Obinutuzumab      Obinutuzumab      Obinutuzumab    Cycle 2: A cycle is 28 days:     Venetoclax      Venetoclax      Venetoclax      Venetoclax      Obinutuzumab    Cycles 3 through 6: A cycle is 28 days:     Venetoclax      Obinutuzumab    Cycles 7 through 12: A cycle is 28 days:     Venetoclax   **Always confirm dose/schedule in your pharmacy ordering system**  Patient Characteristics: Chronic Lymphocytic Leukemia (CLL), Treatment Indicated, First Line, 17p del(-)/Unknown and ATM Mutation Negative/Unknown and TP53 Mutation Negative/Unknown, Age ? 59 or Frail (Any Age) Disease Type: Chronic Lymphocytic Leukemia (CLL) Disease Type: Not Applicable Disease Type: Not Applicable Treatment Indicated<= Treatment Indicated Line of Therapy: First Line ATM Mutation Status: Negative 17p Deletion Status: Negative TP53 Mutation Status: Negative Patient Age: ? 3 Patient Condition: Fit Patient Intent of Therapy: Non-Curative / Palliative Intent, Discussed with Patient

## 2020-07-14 ENCOUNTER — Telehealth: Payer: Self-pay | Admitting: Emergency Medicine

## 2020-07-14 ENCOUNTER — Telehealth: Payer: Self-pay | Admitting: Hematology

## 2020-07-14 NOTE — Telephone Encounter (Signed)
Spoke with the pt  She is scheduled with RB for appt on 07/19/20  She was advised she did not need PFT and so this has been cancelled  She states a few days after her bronch she developed PNA and was txed with Levaquin by Dr Irene Limbo  She is feeling much improved  She is going to be f/u with oncology and has appt 07/22/20 for her infusion  She is asking if she can cancel appt with RB for 07/19/20  Please advise, thanks!

## 2020-07-14 NOTE — Telephone Encounter (Signed)
Scheduled per 09/27 los, patient has been called and notified of upcoming appointments.

## 2020-07-15 NOTE — Telephone Encounter (Signed)
Called and spoke to pt. Appt has been canceled and advised to call back when ready to reschedule. Pt verbalized understanding and denied any further questions or concerns at this time.

## 2020-07-15 NOTE — Telephone Encounter (Signed)
Yes ok to cancel

## 2020-07-16 NOTE — Progress Notes (Signed)
Pharmacist Chemotherapy Monitoring - Initial Assessment    Anticipated start date: 07/22/20   Regimen:  . Are orders appropriate based on the patient's diagnosis, regimen, and cycle? Yes . Does the plan date match the patient's scheduled date? Yes . Is the sequencing of drugs appropriate? Yes . Are the premedications appropriate for the patient's regimen? Yes . Prior Authorization for treatment is: Pending o If applicable, is the correct biosimilar selected based on the patient's insurance? not applicable  Organ Function and Labs: Marland Kitchen Are dose adjustments needed based on the patient's renal function, hepatic function, or hematologic function? Yes . Are appropriate labs ordered prior to the start of patient's treatment? Yes . Other organ system assessment, if indicated: N/A . The following baseline labs, if indicated, have been ordered: obinutuzumab: baseline Hepatitis B labs  Dose Assessment: . Are the drug doses appropriate? Yes . Are the following correct: o Drug concentrations Yes o IV fluid compatible with drug Yes o Administration routes Yes o Timing of therapy Yes . If applicable, does the patient have documented access for treatment and/or plans for port-a-cath placement? yes . If applicable, have lifetime cumulative doses been properly documented and assessed? not applicable Lifetime Dose Tracking  No doses have been documented on this patient for the following tracked chemicals: Doxorubicin, Epirubicin, Idarubicin, Daunorubicin, Mitoxantrone, Bleomycin, Oxaliplatin, Carboplatin, Liposomal Doxorubicin  o   Toxicity Monitoring/Prevention: . The patient has the following take home antiemetics prescribed: Prochlorperazine . The patient has the following take home medications prescribed: N/A . Medication allergies and previous infusion related reactions, if applicable, have been reviewed and addressed. Yes . The patient's current medication list has been assessed for drug-drug  interactions with their chemotherapy regimen. no significant drug-drug interactions were identified on review.  Order Review: . Are the treatment plan orders signed? Yes . Is the patient scheduled to see a provider prior to their treatment? Yes  I verify that I have reviewed each item in the above checklist and answered each question accordingly.  Liberty Seto K 07/16/2020 1:00 PM

## 2020-07-17 ENCOUNTER — Other Ambulatory Visit: Payer: Self-pay | Admitting: Cardiovascular Disease

## 2020-07-17 DIAGNOSIS — Z79899 Other long term (current) drug therapy: Secondary | ICD-10-CM

## 2020-07-17 DIAGNOSIS — I10 Essential (primary) hypertension: Secondary | ICD-10-CM

## 2020-07-18 ENCOUNTER — Encounter: Payer: Self-pay | Admitting: Hematology

## 2020-07-19 ENCOUNTER — Ambulatory Visit: Payer: 59 | Admitting: Emergency Medicine

## 2020-07-19 NOTE — Progress Notes (Signed)
Pharmacist Chemotherapy Monitoring - Initial Assessment    Anticipated start date: 07/23/20   Regimen:   Are orders appropriate based on the patients diagnosis, regimen, and cycle? Yes  Does the plan date match the patients scheduled date? Yes  Is the sequencing of drugs appropriate? Yes  Are the premedications appropriate for the patients regimen? Yes  Prior Authorization for treatment is: Pending o If applicable, is the correct biosimilar selected based on the patient's insurance? not applicable  Organ Function and Labs:  Are dose adjustments needed based on the patient's renal function, hepatic function, or hematologic function? Yes  Are appropriate labs ordered prior to the start of patient's treatment? No  Other organ system assessment, if indicated: N/A  The following baseline labs, if indicated, have been ordered: obinutuzumab: baseline Hepatitis B labs  Dose Assessment:  Are the drug doses appropriate? Yes  Are the following correct: o Drug concentrations Yes o IV fluid compatible with drug Yes o Administration routes Yes o Timing of therapy Yes  If applicable, does the patient have documented access for treatment and/or plans for port-a-cath placement? not applicable  If applicable, have lifetime cumulative doses been properly documented and assessed? not applicable Lifetime Dose Tracking  No doses have been documented on this patient for the following tracked chemicals: Doxorubicin, Epirubicin, Idarubicin, Daunorubicin, Mitoxantrone, Bleomycin, Oxaliplatin, Carboplatin, Liposomal Doxorubicin  o   Toxicity Monitoring/Prevention:  The patient has the following take home antiemetics prescribed: N/A  The patient has the following take home medications prescribed: N/A  Medication allergies and previous infusion related reactions, if applicable, have been reviewed and addressed. Yes  The patient's current medication list has been assessed for drug-drug  interactions with their chemotherapy regimen. no significant drug-drug interactions were identified on review.  Order Review:  Are the treatment plan orders signed? No  Is the patient scheduled to see a provider prior to their treatment? Yes  I verify that I have reviewed each item in the above checklist and answered each question accordingly.  Emanuelle Hammerstrom K 07/19/2020 3:07 PM

## 2020-07-20 ENCOUNTER — Other Ambulatory Visit: Payer: Self-pay

## 2020-07-20 ENCOUNTER — Inpatient Hospital Stay: Payer: 59 | Attending: Hematology and Oncology

## 2020-07-20 ENCOUNTER — Inpatient Hospital Stay: Payer: 59

## 2020-07-20 DIAGNOSIS — Z86000 Personal history of in-situ neoplasm of breast: Secondary | ICD-10-CM | POA: Insufficient documentation

## 2020-07-20 DIAGNOSIS — C9110 Chronic lymphocytic leukemia of B-cell type not having achieved remission: Secondary | ICD-10-CM | POA: Diagnosis present

## 2020-07-20 DIAGNOSIS — Z5112 Encounter for antineoplastic immunotherapy: Secondary | ICD-10-CM | POA: Insufficient documentation

## 2020-07-20 DIAGNOSIS — R509 Fever, unspecified: Secondary | ICD-10-CM | POA: Diagnosis not present

## 2020-07-20 DIAGNOSIS — R7989 Other specified abnormal findings of blood chemistry: Secondary | ICD-10-CM | POA: Insufficient documentation

## 2020-07-20 DIAGNOSIS — Z23 Encounter for immunization: Secondary | ICD-10-CM

## 2020-07-20 NOTE — Progress Notes (Signed)
   Covid-19 Vaccination Clinic  Name:  Colleen Shannon    MRN: [id number] DOB: Mar 13, 1956  07/20/2020  Colleen Shannon was observed post Covid-19 immunization for 15 minutes without incident. She was provided with Vaccine Information Sheet and instruction to access the V-Safe system.   Colleen Shannon was instructed to call 911 with any severe reactions post vaccine: Marland Kitchen Difficulty breathing  . Swelling of face and throat  . A fast heartbeat  . A bad rash all over body  . Dizziness and weakness

## 2020-07-21 ENCOUNTER — Encounter: Payer: Self-pay | Admitting: Hematology

## 2020-07-21 ENCOUNTER — Other Ambulatory Visit: Payer: Self-pay | Admitting: Hematology

## 2020-07-21 MED ORDER — ONDANSETRON HCL 8 MG PO TABS: 8.0000 mg | ORAL_TABLET | Freq: Three times a day (TID) | ORAL | 3 refills | Status: DC | PRN

## 2020-07-22 ENCOUNTER — Ambulatory Visit: Payer: 59

## 2020-07-22 ENCOUNTER — Inpatient Hospital Stay: Payer: 59

## 2020-07-22 ENCOUNTER — Other Ambulatory Visit: Payer: Self-pay | Admitting: Hematology

## 2020-07-22 ENCOUNTER — Other Ambulatory Visit: Payer: Self-pay

## 2020-07-22 ENCOUNTER — Ambulatory Visit: Payer: 59 | Admitting: Emergency Medicine

## 2020-07-22 VITALS — BP 115/80 | HR 103 | Temp 99.8°F | Resp 16

## 2020-07-22 DIAGNOSIS — C9110 Chronic lymphocytic leukemia of B-cell type not having achieved remission: Secondary | ICD-10-CM

## 2020-07-22 DIAGNOSIS — Z7189 Other specified counseling: Secondary | ICD-10-CM

## 2020-07-22 DIAGNOSIS — Z5112 Encounter for antineoplastic immunotherapy: Secondary | ICD-10-CM | POA: Diagnosis not present

## 2020-07-22 LAB — CMP (CANCER CENTER ONLY)
ALT: 45 U/L — ABNORMAL HIGH (ref 0–44)
AST: 42 U/L — ABNORMAL HIGH (ref 15–41)
Albumin: 4 g/dL (ref 3.5–5.0)
Alkaline Phosphatase: 50 U/L (ref 38–126)
Anion gap: 9 (ref 5–15)
BUN: 13 mg/dL (ref 8–23)
CO2: 30 mmol/L (ref 22–32)
Calcium: 9.9 mg/dL (ref 8.9–10.3)
Chloride: 103 mmol/L (ref 98–111)
Creatinine: 0.75 mg/dL (ref 0.44–1.00)
GFR, Estimated: >60 mL/min (ref >60)
Glucose, Bld: 158 mg/dL — ABNORMAL HIGH (ref 70–99)
Potassium: 3.6 mmol/L (ref 3.5–5.1)
Sodium: 142 mmol/L (ref 135–145)
Total Bilirubin: 0.6 mg/dL (ref 0.3–1.2)
Total Protein: 6.4 g/dL — ABNORMAL LOW (ref 6.5–8.1)

## 2020-07-22 LAB — CBC WITH DIFFERENTIAL (CANCER CENTER ONLY)
Abs Immature Granulocytes: 0.03 10*3/uL (ref 0.00–0.07)
Basophils Absolute: 0.1 10*3/uL (ref 0.0–0.1)
Basophils Relative: 0 %
Eosinophils Absolute: 0.2 10*3/uL (ref 0.0–0.5)
Eosinophils Relative: 1 %
HCT: 41.6 % (ref 36.0–46.0)
Hemoglobin: 13.3 g/dL (ref 12.0–15.0)
Immature Granulocytes: 0 %
Lymphocytes Relative: 80 %
Lymphs Abs: 19.4 10*3/uL — ABNORMAL HIGH (ref 0.7–4.0)
MCH: 30.4 pg (ref 26.0–34.0)
MCHC: 32 g/dL (ref 30.0–36.0)
MCV: 95 fL (ref 80.0–100.0)
Monocytes Absolute: 2.1 10*3/uL — ABNORMAL HIGH (ref 0.1–1.0)
Monocytes Relative: 9 %
Neutro Abs: 2.5 10*3/uL (ref 1.7–7.7)
Neutrophils Relative %: 10 %
Platelet Count: 181 10*3/uL (ref 150–400)
RBC: 4.38 MIL/uL (ref 3.87–5.11)
RDW: 13.3 % (ref 11.5–15.5)
WBC Count: 24.3 10*3/uL — ABNORMAL HIGH (ref 4.0–10.5)
nRBC: 0 % (ref 0.0–0.2)

## 2020-07-22 LAB — HEPATITIS PANEL, ACUTE
HCV Ab: NONREACTIVE
Hep A IgM: NONREACTIVE
Hep B C IgM: NONREACTIVE
Hepatitis B Surface Ag: NONREACTIVE

## 2020-07-22 LAB — HEPATITIS B SURFACE ANTIBODY,QUALITATIVE: Hep B S Ab: REACTIVE — AB

## 2020-07-22 LAB — LACTATE DEHYDROGENASE: LDH: 360 U/L — ABNORMAL HIGH (ref 98–192)

## 2020-07-22 MED ORDER — ACETAMINOPHEN 325 MG PO TABS
650.0000 mg | ORAL_TABLET | Freq: Once | ORAL | Status: AC
  Administered 2020-07-22: 650 mg via ORAL

## 2020-07-22 MED ORDER — DIPHENHYDRAMINE HCL 50 MG/ML IJ SOLN
50.0000 mg | Freq: Once | INTRAMUSCULAR | Status: AC
  Administered 2020-07-22: 50 mg via INTRAVENOUS

## 2020-07-22 MED ORDER — SODIUM CHLORIDE 0.9 % IV SOLN
Freq: Once | INTRAVENOUS | Status: AC
  Filled 2020-07-22: qty 250

## 2020-07-22 MED ORDER — DIPHENHYDRAMINE HCL 50 MG/ML IJ SOLN
INTRAMUSCULAR | Status: AC
  Filled 2020-07-22: qty 1

## 2020-07-22 MED ORDER — FAMOTIDINE IN NACL 20-0.9 MG/50ML-% IV SOLN
INTRAVENOUS | Status: AC
  Filled 2020-07-22: qty 50

## 2020-07-22 MED ORDER — SODIUM CHLORIDE 0.9 % IV SOLN
20.0000 mg | Freq: Once | INTRAVENOUS | Status: AC
  Administered 2020-07-22: 20 mg via INTRAVENOUS
  Filled 2020-07-22: qty 20

## 2020-07-22 MED ORDER — SODIUM CHLORIDE 0.9 % IV SOLN
100.0000 mg | Freq: Once | INTRAVENOUS | Status: AC
  Administered 2020-07-22: 100 mg via INTRAVENOUS
  Filled 2020-07-22: qty 4

## 2020-07-22 MED ORDER — FAMOTIDINE IN NACL 20-0.9 MG/50ML-% IV SOLN
20.0000 mg | Freq: Once | INTRAVENOUS | Status: AC
  Administered 2020-07-22: 20 mg via INTRAVENOUS

## 2020-07-22 MED ORDER — LORAZEPAM 2 MG/ML IJ SOLN
0.5000 mg | Freq: Once | INTRAMUSCULAR | Status: AC
  Administered 2020-07-22: 0.5 mg via INTRAVENOUS

## 2020-07-22 MED ORDER — MONTELUKAST SODIUM 10 MG PO TABS
ORAL_TABLET | ORAL | Status: AC
  Filled 2020-07-22: qty 1

## 2020-07-22 MED ORDER — METHYLPREDNISOLONE SODIUM SUCC 125 MG IJ SOLR
125.0000 mg | Freq: Once | INTRAMUSCULAR | Status: AC | PRN
  Administered 2020-07-22: 125 mg via INTRAVENOUS

## 2020-07-22 MED ORDER — MONTELUKAST SODIUM 10 MG PO TABS: 10.0000 mg | ORAL_TABLET | Freq: Once | ORAL | Status: AC

## 2020-07-22 MED ORDER — SODIUM CHLORIDE 0.9 % IV SOLN
Freq: Once | INTRAVENOUS | Status: DC | PRN
  Filled 2020-07-22: qty 250

## 2020-07-22 MED ORDER — LORAZEPAM 2 MG/ML IJ SOLN
INTRAMUSCULAR | Status: AC
  Filled 2020-07-22: qty 1

## 2020-07-22 MED ORDER — MONTELUKAST SODIUM 10 MG PO TABS
10.0000 mg | ORAL_TABLET | Freq: Every day | ORAL | Status: DC
  Administered 2020-07-22: 10 mg via ORAL

## 2020-07-22 MED ORDER — ACETAMINOPHEN 325 MG PO TABS
ORAL_TABLET | ORAL | Status: AC
  Filled 2020-07-22: qty 2

## 2020-07-22 NOTE — Patient Instructions (Signed)
Colleen Shannon Discharge Instructions for Patients Receiving Chemotherapy  Today you received the following chemotherapy agents: obinutuzumab  To help prevent nausea and vomiting after your treatment, we encourage you to take your nausea medication as prescribed.   If you develop nausea and vomiting that is not controlled by your nausea medication, call the clinic.   BELOW ARE SYMPTOMS THAT SHOULD BE REPORTED IMMEDIATELY:  *FEVER GREATER THAN 100.5 F  *CHILLS WITH OR WITHOUT FEVER  NAUSEA AND VOMITING THAT IS NOT CONTROLLED WITH YOUR NAUSEA MEDICATION  *UNUSUAL SHORTNESS OF BREATH  *UNUSUAL BRUISING OR BLEEDING  TENDERNESS IN MOUTH AND THROAT WITH OR WITHOUT PRESENCE OF ULCERS  *URINARY PROBLEMS  *BOWEL PROBLEMS  UNUSUAL RASH Items with * indicate a potential emergency and should be followed up as soon as possible.  Feel free to call the clinic should you have any questions or concerns. The clinic phone number is [phone].  Please show the [facility] at check-in to the Emergency Department and triage nurse.  Obinutuzumab injection What is this medicine? OBINUTUZUMAB (OH bi nue TOOZ ue mab) is a monoclonal antibody. It is used to treat chronic lymphocytic leukemia (CLL) and a type of non-Hodgkin lymphoma (NHL), follicular lymphoma. This medicine may be used for other purposes; ask your health care provider or pharmacist if you have questions. COMMON BRAND NAME(S): GAZYVA What should I tell my health care provider before I take this medicine? They need to know if you have any of these conditions:  infection (especially a virus infection such as hepatitis B virus)  lung or breathing disease  heart disease  take medicines that treat or prevent blood clots  an unusual or allergic reaction to obinutuzumab, other medicines, foods, dyes, or preservatives  pregnant or trying to get pregnant  breast-feeding How should I use this medicine? This  medicine is for infusion into a vein. It is given by a health care professional in a hospital or clinic setting. Talk to your pediatrician regarding the use of this medicine in children. Special care may be needed. Overdosage: If you think you have taken too much of this medicine contact a poison control center or emergency room at once. NOTE: This medicine is only for you. Do not share this medicine with others. What if I miss a dose? Keep appointments for follow-up doses as directed. It is important not to miss your dose. Call your doctor or health care professional if you are unable to keep an appointment. What may interact with this medicine?  live virus vaccines This list may not describe all possible interactions. Give your health care provider a list of all the medicines, herbs, non-prescription drugs, or dietary supplements you use. Also tell them if you smoke, drink alcohol, or use illegal drugs. Some items may interact with your medicine. What should I watch for while using this medicine? Report any side effects that you notice during your treatment right away, such as changes in your breathing, fever, chills, dizziness or lightheadedness. These effects are more common with the first dose. Visit your prescriber or health care professional for checks on your progress. You will need to have regular blood work. Report any other side effects. The side effects of this medicine can continue after you finish your treatment. Continue your course of treatment even though you feel ill unless your doctor tells you to stop. Call your doctor or health care professional for advice if you get a fever, chills or sore throat, or other  symptoms of a cold or flu. Do not treat yourself. This drug decreases your body's ability to fight infections. Try to avoid being around people who are sick. This medicine may increase your risk to bruise or bleed. Call your doctor or health care professional if you notice any  unusual bleeding. Do not become pregnant while taking this medicine or for 6 months after stopping it. Women should inform their doctor if they wish to become pregnant or think they might be pregnant. There is a potential for serious side effects to an unborn child. Talk to your health care professional or pharmacist for more information. Do not breast-feed an infant while taking this medicine or for 6 months after stopping it. What side effects may I notice from receiving this medicine? Side effects that you should report to your doctor or health care professional as soon as possible:  allergic reactions like skin rash, itching or hives, swelling of the face, lips, or tongue  breathing problems  changes in vision  chest pain or chest tightness  confusion  dizziness  loss of balance or coordination  low blood counts - this medicine may decrease the number of white blood cells, red blood cells and platelets. You may be at increased risk for infections and bleeding.  signs of decreased platelets or bleeding - bruising, pinpoint red spots on the skin, black, tarry stools, blood in the urine  signs of infection - fever or chills, cough, sore throat, pain or trouble passing urine  signs and symptoms of liver injury like dark yellow or brown urine; general ill feeling or flu-like symptoms; light-colored stools; loss of appetite; nausea; right upper belly pain; unusually weak or tired; yellowing of the eyes or skin  trouble speaking or understanding  trouble walking  vomiting Side effects that usually do not require medical attention (report to your doctor or health care professional if they continue or are bothersome):  constipation  joint pain  muscle pain This list may not describe all possible side effects. Call your doctor for medical advice about side effects. You may report side effects to FDA at 1-800-FDA-1088. Where should I keep my medicine? This drug is only given in a  hospital or clinic and will not be stored at home. NOTE: This sheet is a summary. It may not cover all possible information. If you have questions about this medicine, talk to your doctor, pharmacist, or health care provider.  2020 [employer] Standard (2019-01-07 15:34:53)

## 2020-07-22 NOTE — Progress Notes (Signed)
At approximately 1140, pt noted nausea, vomiting, and an episode of diarrhea. She then became chilled without fever and hypotensive. Gazyva paused, hypersensitivity protocol started, and NS open to gravity. Dr. Irene Limbo to tx area to assess patient. 10mg  montelukast given and 80mg  solu-medrol given per verbal order. Renee Pain, to chairside for assistance with reaction. Patient remained A&Ox4 throughout reaction and noted feeling much better after fluids and noted medications. Infusion restarted at approximately 1216. Writer will continue to monitor.

## 2020-07-23 ENCOUNTER — Encounter: Payer: Self-pay | Admitting: Hematology

## 2020-07-23 ENCOUNTER — Other Ambulatory Visit: Payer: Self-pay

## 2020-07-23 ENCOUNTER — Inpatient Hospital Stay: Payer: 59

## 2020-07-23 ENCOUNTER — Inpatient Hospital Stay (HOSPITAL_BASED_OUTPATIENT_CLINIC_OR_DEPARTMENT_OTHER): Payer: 59 | Admitting: Hematology

## 2020-07-23 ENCOUNTER — Ambulatory Visit (HOSPITAL_COMMUNITY)
Admission: RE | Admit: 2020-07-23 | Discharge: 2020-07-23 | Disposition: A | Discharge status: Home / Self Care | Payer: 59 | Source: Ambulatory Visit | Attending: Hematology | Admitting: Hematology

## 2020-07-23 ENCOUNTER — Encounter: Payer: Self-pay | Admitting: *Deleted

## 2020-07-23 DIAGNOSIS — R509 Fever, unspecified: Secondary | ICD-10-CM | POA: Diagnosis not present

## 2020-07-23 DIAGNOSIS — Z5112 Encounter for antineoplastic immunotherapy: Secondary | ICD-10-CM | POA: Diagnosis not present

## 2020-07-23 DIAGNOSIS — C9110 Chronic lymphocytic leukemia of B-cell type not having achieved remission: Secondary | ICD-10-CM | POA: Insufficient documentation

## 2020-07-23 LAB — SAMPLE TO BLOOD BANK

## 2020-07-23 LAB — CMP (CANCER CENTER ONLY)
ALT: 278 U/L (ref 0–44)
AST: 268 U/L (ref 15–41)
Albumin: 3.6 g/dL (ref 3.5–5.0)
Alkaline Phosphatase: 71 U/L (ref 38–126)
Anion gap: 9 (ref 5–15)
BUN: 21 mg/dL (ref 8–23)
CO2: 23 mmol/L (ref 22–32)
Calcium: 8.8 mg/dL — ABNORMAL LOW (ref 8.9–10.3)
Chloride: 106 mmol/L (ref 98–111)
Creatinine: 0.73 mg/dL (ref 0.44–1.00)
GFR, Estimated: >60 mL/min (ref >60)
Glucose, Bld: 170 mg/dL — ABNORMAL HIGH (ref 70–99)
Potassium: 4.2 mmol/L (ref 3.5–5.1)
Sodium: 138 mmol/L (ref 135–145)
Total Bilirubin: 0.7 mg/dL (ref 0.3–1.2)
Total Protein: 6.1 g/dL — ABNORMAL LOW (ref 6.5–8.1)

## 2020-07-23 LAB — CBC WITH DIFFERENTIAL (CANCER CENTER ONLY)
Abs Immature Granulocytes: 0.03 10*3/uL (ref 0.00–0.07)
Basophils Absolute: 0 10*3/uL (ref 0.0–0.1)
Basophils Relative: 0 %
Eosinophils Absolute: 0 10*3/uL (ref 0.0–0.5)
Eosinophils Relative: 0 %
HCT: 38.9 % (ref 36.0–46.0)
Hemoglobin: 12.8 g/dL (ref 12.0–15.0)
Immature Granulocytes: 0 %
Lymphocytes Relative: 34 %
Lymphs Abs: 3.6 10*3/uL (ref 0.7–4.0)
MCH: 30.5 pg (ref 26.0–34.0)
MCHC: 32.9 g/dL (ref 30.0–36.0)
MCV: 92.8 fL (ref 80.0–100.0)
Monocytes Absolute: 0.4 10*3/uL (ref 0.1–1.0)
Monocytes Relative: 3 %
Neutro Abs: 6.5 10*3/uL (ref 1.7–7.7)
Neutrophils Relative %: 63 %
Platelet Count: 90 10*3/uL — ABNORMAL LOW (ref 150–400)
RBC: 4.19 MIL/uL (ref 3.87–5.11)
RDW: 13.8 % (ref 11.5–15.5)
WBC Count: 10.5 10*3/uL (ref 4.0–10.5)
nRBC: 0 % (ref 0.0–0.2)

## 2020-07-23 LAB — LACTATE DEHYDROGENASE: LDH: 1045 U/L — ABNORMAL HIGH (ref 98–192)

## 2020-07-23 LAB — URINALYSIS, ROUTINE W REFLEX MICROSCOPIC
Bilirubin Urine: NEGATIVE
Glucose, UA: NEGATIVE mg/dL
Hgb urine dipstick: NEGATIVE
Ketones, ur: NEGATIVE mg/dL
Leukocytes,Ua: NEGATIVE
Nitrite: NEGATIVE
Protein, ur: NEGATIVE mg/dL
Specific Gravity, Urine: 1.004 — ABNORMAL LOW (ref 1.005–1.030)
pH: 6 (ref 5.0–8.0)

## 2020-07-23 LAB — PROTIME-INR
INR: 1.2 (ref 0.8–1.2)
Prothrombin Time: 14.9 seconds (ref 11.4–15.2)

## 2020-07-23 NOTE — Progress Notes (Signed)
HEMATOLOGY/ONCOLOGY CLINIC NOTE  Date of Service: 07/23/2020  Patient Care Team: Carol Ada, MD as PCP - General (Family Medicine) Troy Sine, MD as PCP - Cardiology (Cardiology)  CHIEF COMPLAINTS/PURPOSE OF CONSULTATION:  CLL  HISTORY OF PRESENTING ILLNESS:  Colleen Shannon is a wonderful 64 y.o. female who has been referred to [place] by Dr. Lindi Adie for evaluation and management of CLL. The pt reports that she is doing well overall.   The pt reports that she was being seen by Dr. Lindi Adie for Lobular Carcinoma in situ (LCIS) of the left breast. She had a lumpectomy and recently completed 5 years of Tamoxifen for breast cancer prevention. Pt was having frequent respiratory infections 3-4 years ago and after some labwork was found to have CLL. Dr. Lindi Adie has been monitoring this and pt has never required treatment. She has not had as many issues with recurrent infections in the last 2-3 years. She has had two SCC lesions found and removed. Her last was found 8-10 years ago.   She has felt well over the last 6-12 months. Pt has had lymphadenopathy over the years that often swells and shrinks rapidly. About two weeks ago she began to notice bilateral neck swelling. It was uncomfortable at times and affected her breathing. Her neck swelling is starting to settle down and is no longer causing her discomfort. She received her last vaccine ([place]) in March and has not had any infections recently. Pt has joint pain for which she saw Dr. Estanislado Pandy who did not think that the pt has RA.   01/31/2016 Peripheral Blood Flow Cytometry Report revealed "Chronic Lymphocytic Leukemia."  Most recent lab results (12/01/2019) of CBC is as follows: all values are WNL except for WBC at 43.4K, PLT at 145K, Lymphs Abs at 37.2K, Mono Abs at 3.0K, Baso Abs at 0.2K, Glucose at 135, Total Protein at 6.1, ALP at 37. 12/01/2019 LDH at 285  On review of systems, pt reports joint pain, improving lumps and denies  fevers, chills, sweats, fatigue, unexpected weight loss, abdominal pain and any other symptoms.   On PMHx the pt reports Lobular Carcinoma in situ, CLL, Lumpectomy, Arthritis, Pneumonia, SCC. 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  INTERVAL HISTORY: Colleen Shannon is a wonderful 64 y.o. female who is here for evaluation and management of CLL. The patient's last visit with [place] was on 07/05/2020. The pt reports that she is doing well overall.  The pt reports that she is currently experiencing a fever. She feels well otherwise and denies any other new focal symptoms.  Lab results today (07/23/20) of CBC w/diff and CMP is as follows: all values are WNL except for PLT at 90K, Glucose at 170, Calcium at 8.8, Total Protein at 6.1, AST at 268, ALT at 278. 07/23/2020 LDH at 1045  On review of systems, pt reports fevers and denies SOB, chest pain, diarrhea and any other symptoms.   MEDICAL HISTORY:  Past Medical History:  Diagnosis Date  . Arthritis   . Breast CA ([place]) dx'd 2015   left  . Bronchiectasis ([place])   . Cancer (HCC)    hx of skin cancer   . CLL (chronic lymphocytic leukemia) ([place]) dx'd 2015  . Dyspnea   . Dysrhythmia    PVCs  . Elevated cholesterol 05/22/2012   Nuc stress test-Normal.  . Endometrial polyp   . GERD (gastroesophageal reflux disease)   . Grade I diastolic dysfunction  noted on echo  . History of palpitations   . Hypertension 05/22/12   ECHO-EF >55% trace tricupsid regurgitation. There is mild mitral regurgitation.   . MR (mitral regurgitation)    mild noted on echo  . Plantar fasciitis of left foot    resolved  . Pneumonia    history of x 2  . PONV (postoperative nausea and vomiting)     SURGICAL HISTORY: Past Surgical History:  Procedure Laterality Date  . ABDOMINAL SURGERY     Abdominoplasty  . BRONCHIAL BIOPSY  06/29/2020   Procedure: BRONCHIAL BIOPSIES;  Surgeon: Collene Gobble, MD;  Location: [facility] ENDOSCOPY;  Service:  Cardiopulmonary;;  . BRONCHIAL BRUSHINGS  06/29/2020   Procedure: BRONCHIAL BRUSHINGS;  Surgeon: Collene Gobble, MD;  Location: [facility];  Service: Cardiopulmonary;;  . BRONCHIAL WASHINGS  06/29/2020   Procedure: BRONCHIAL WASHINGS;  Surgeon: Collene Gobble, MD;  Location[facility] ENDOSCOPY;  Service: Cardiopulmonary;;  . CESAREAN SECTION     X 2  . CHOLECYSTECTOMY    . COLONSCOPY    . DILATION AND CURETTAGE OF UTERUS    . FOOT SURGERY Left   . HYSTEROSCOPY    . KNEE ARTHROTOMY Right 06/04/2019   Procedure: Right knee arthrotomy; scar excision;  Surgeon: [name], MD;  Location: [facility] ORS;  Service: Orthopedics;  Laterality: Right;  92mn  . KNEE CLOSED REDUCTION Right 07/20/2014   Procedure: RIGHT KNEE CLOSED MANIPULATION;  Surgeon: FGearlean Alf MD;  Location: [facility] ORS;  Service: Orthopedics;  Laterality: Right;  . KNEE SURGERY Right    arthroscopic  . PARTIAL KNEE ARTHROPLASTY Left 2017  . RADIOACTIVE SEED GUIDED EXCISIONAL BREAST BIOPSY N/A 10/06/2014   Procedure: RADIOACTIVE SEED GUIDED EXCISIONAL BREAST BIOPSY;  Surgeon: MRolm Bookbinder MD;  [facility]  Service: General;  Laterality: N/A;  . REFRACTIVE SURGERY    . TOTAL KNEE ARTHROPLASTY Right 05/11/2014   Procedure: RIGHT TOTAL KNEE ARTHROPLASTY;  Surgeon: FGearlean Alf MD;  Location: [facility] ORS;  Service: Orthopedics;  Laterality: Right;  . TUBAL LIGATION    . VIDEO BRONCHOSCOPY N/A 06/29/2020   Procedure: VIDEO BRONCHOSCOPY WITHOUT FLUORO;  Surgeon: BCollene Gobble MD;  [facility];  Service: Cardiopulmonary;  Laterality: N/A;  with BAL    SOCIAL HISTORY: Social History   Socioeconomic History  . Marital status: Married    Spouse name: Not on file  . Number of children: Not on file  . Years of education: Not on file  . Highest education level: Not on file  Occupational History  . Not on file  Tobacco Use  . Smoking status: Never Smoker  . Smokeless tobacco: Never Used  Vaping  Use  . Vaping Use: Never used  Substance and Sexual Activity  . Alcohol use: Not Currently    Comment: occasional   . Drug use: No  . Sexual activity: Yes    Birth control/protection: Surgical, Post-menopausal    Comment: Hysterectomy  Other Topics Concern  . Not on file  Social History Narrative  . Not on file   Social Determinants of Health   Financial Resource Strain:   . Difficulty of Paying Living Expenses: Not on file  Food Insecurity:   . Worried About RCharity fundraiserin the Last Year: Not on file  . Ran Out of Food in the Last Year: Not on file  Transportation Needs:   . Lack of Transportation (Medical): Not on file  . Lack of Transportation (Non-Medical): Not  on file  Physical Activity:   . Days of Exercise per Week: Not on file  . Minutes of Exercise per Session: Not on file  Stress:   . Feeling of Stress : Not on file  Social Connections:   . Frequency of Communication with Friends and Family: Not on file  . Frequency of Social Gatherings with Friends and Family: Not on file  . Attends Religious Services: Not on file  . Active Member of Clubs or Organizations: Not on file  . Attends Archivist Meetings: Not on file  . Marital Status: Not on file  Intimate Partner Violence:   . Fear of Current or Ex-Partner: Not on file  . Emotionally Abused: Not on file  . Physically Abused: Not on file  . Sexually Abused: Not on file    FAMILY HISTORY: Family History  Problem Relation Age of Onset  . Heart disease Father   . Dementia Father   . Aneurysm Mother   . Hyperlipidemia Mother   . Alzheimer's disease Mother   . Osteoarthritis Mother   . Hypertension Brother   . High Cholesterol Brother   . Cancer - Other Paternal Grandmother        uterine  . Hypertension Paternal Grandfather   . Stroke Paternal Grandfather   . Heart disease Maternal Grandfather   . Cancer - Other Maternal Grandfather        lung  . Cushing syndrome Brother   . Healthy  Son   . Healthy Daughter     ALLERGIES:  has No Known Allergies.  MEDICATIONS:  Current Outpatient Medications  Medication Sig Dispense Refill  . albuterol (VENTOLIN HFA) 108 (90 Base) MCG/ACT inhaler Inhale 1-2 puffs into the lungs every 6 (six) hours as needed for wheezing or shortness of breath.    Marland Kitchen aspirin EC 81 MG tablet Take 81 mg by mouth every other day. Swallow whole.     Marland Kitchen atorvastatin (LIPITOR) 20 MG tablet Take 1 tablet (20 mg total) by mouth at bedtime.    . Calcium Carb-Cholecalciferol (CALCIUM-VITAMIN D3) 600-400 MG-UNIT TABS Take 1 tablet by mouth 2 (two) times daily.    . carvedilol (COREG) 12.5 MG tablet Take 0.5 tablets (6.25 mg total) by mouth 2 (two) times daily with a meal. 180 tablet 3  . cholecalciferol (VITAMIN D3) 25 MCG (1000 UNIT) tablet Take 1,000 Units by mouth in the morning and at bedtime.    . Cyanocobalamin (B-12) 2500 MCG TABS Take 2,500 mcg by mouth daily.    Marland Kitchen ezetimibe (ZETIA) 10 MG tablet TAKE 1 TABLET BY MOUTH AT  BEDTIME 90 tablet 3  . magnesium oxide (MAG-OX) 400 MG tablet Take 400 mg by mouth every other day.     . Multiple Vitamin (MULTIVITAMIN WITH MINERALS) TABS tablet Take 1 tablet by mouth daily.    . Omega-3 Fatty Acids (FISH OIL) 1200 MG CAPS Take 1,200 mg by mouth in the morning and at bedtime. 120 mg-180 mg    . Omeprazole 20 MG TBEC Take 20 mg by mouth in the morning.     . ondansetron (ZOFRAN) 8 MG tablet Take 1 tablet (8 mg total) by mouth every 8 (eight) hours as needed for nausea. 30 tablet 3  . Turmeric 500 MG TABS Take 1,000 mg by mouth daily.      No current facility-administered medications for this visit.    REVIEW OF SYSTEMS:   A 10+ POINT REVIEW OF SYSTEMS WAS OBTAINED including neurology, dermatology, psychiatry,  cardiac, respiratory, lymph, extremities, GI, GU, Musculoskeletal, constitutional, breasts, reproductive, HEENT.  All pertinent positives are noted in the HPI.  All others are negative.   PHYSICAL  EXAMINATION: ECOG PERFORMANCE STATUS: 0 - Asymptomatic VS reviewed GENERAL:alert, in no acute distress and comfortable SKIN: no acute rashes, no significant lesions EYES: conjunctiva are pink and non-injected, sclera anicteric OROPHARYNX: MMM, no exudates, no oropharyngeal erythema or ulceration NECK: supple, no JVD LYMPH:  no palpable lymphadenopathy in the cervical, axillary or inguinal regions.  LUNGS: clear to auscultation b/l with normal respiratory effort HEART: regular rate & rhythm ABDOMEN:  normoactive bowel sounds , non tender, not distended. No palpable hepatosplenomegaly. Extremity: no pedal edema PSYCH: alert & oriented x 3 with fluent speech NEURO: no focal motor/sensory deficits  LABORATORY DATA:  I have reviewed the data as listed  . CBC Latest Ref Rng & Units 07/23/2020 07/22/2020 03/03/2020  WBC 4.0 - 10.5 K/uL 10.5 24.3(H) 35.2(H)  Hemoglobin 12.0 - 15.0 g/dL 12.8 13.3 13.2  Hematocrit 36 - 46 % 38.9 41.6 41.1  Platelets 150 - 400 K/uL 90(L) 181 184    . CMP Latest Ref Rng & Units 07/23/2020 07/22/2020 06/29/2020  Glucose 70 - 99 mg/dL 170(H) 158(H) 117(H)  BUN 8 - 23 mg/dL _0 Creatinine 0.44 - 1.00 mg/dL 0.73 0.75 0.63  Sodium 135 - 145 mmol/L 138 142 142  Potassium 3.5 - 5.1 mmol/L 4.2 3.6 4.3  Chloride 98 - 111 mmol/L 106 103 108  CO2 22 - 32 mmol/L _1 Calcium 8.9 - 10.3 mg/dL 8.8(L) 9.9 9.3  Total Protein 6.5 - 8.1 g/dL 6.1(L) 6.4(L) -  Total Bilirubin 0.3 - 1.2 mg/dL 0.7 0.6 -  Alkaline Phos 38 - 126 U/L 71 50 -  AST 15 - 41 U/L 268(HH) 42(H) -  ALT 0 - 44 U/L 278(HH) 45(H) -      03/03/2020 FISH (CLL Prognosis) Panel:     RADIOGRAPHIC STUDIES: I have personally reviewed the radiological images as listed and agreed with the findings in the report. DG Chest 2 View  Result Date: 07/05/2020 CLINICAL DATA:  Shortness of breath and fevers EXAM: CHEST - 2 VIEW COMPARISON:  06/08/2020 FINDINGS: Cardiac shadow is within normal limits.  The lungs are well aerated bilaterally. Patchy left midlung infiltrate is seen projecting in the left upper lobe consistent with acute pneumonia. No other focal abnormality is seen. IMPRESSION: Left-sided pneumonia. Electronically Signed   By: Inez Catalina M.D.   On: 07/05/2020 13:38    ASSESSMENT & PLAN:   64 yo with   1) Rai Stage 1 Chronic lymphocytic Leukemia. Found to have trisomy 12 and 13q deletion via 03/03/2020 FISH (CLL Prognosis) Panel  2) Fever 3) Abnormal LFTs PLAN: -Discussed pt labwork today, 07/23/20; moderate thrombocytopenia, other blood counts are nml, significant elevation of liver enzymes and LDH. -Will hold treatment today due to fever, which could be caused by the COVID19 booster, tumor lysis, or Gazyva.  -Plan to proceed with C1D8 of Gazyva in 7-10 days, given resolution of current fever and liver enzyme elevation. -Advised pt that liver enzymes may be elevated due to treatment or recent COVID19 booster.  -Will send out urinalysis, urine culture, and blood culture today.  -Will repeat Chest XR today -Will get Abd [place] ASAP -Recommend pt go to ED if fevers persist. -Will see back in 3 days with labs   2) Neoplasm of left breast, primary tumor staging category Tis: lobular carcinoma in situ (  LCIS) Left breast LCIS status post lumpectomy 10/06/2014; Started tamoxifen for breast cancer risk reduction 20 mg daily 11/18/2014 ,decrease to 10 mg on 11/22/2017 completed February 2021.  Breast cancer surveillance: Mammogram and ultrasound 8/11/2020at [facility]: Benign breast density category B Breast exam 02/23/2020: Benign  PLAN -continue breast self examinations -MMG Q12 months - next in aug 2021   FOLLOW UP: Additional labs today UA/Ucx and Blood culture CXR today [place] abd ASAP today or Monday Labs on Monday and add visit with Dr Irene Limbo Plz schedule C1D8 of [facility] with labs and visit with Dr Irene Limbo in 7-10 days   The total time spent in the appt was 30 minutes and  more than 50% was on counseling and direct patient cares.,  And f/u on results  All of the patient's questions were answered with apparent satisfaction. The patient knows to call the clinic with any problems, questions or concerns.    Sullivan Lone MD [place] AAHIVMS [facility] Hematology/Oncology Physician [facility]  (Office):       [phone] (Work cell):  [phone] (Fax):           [phone]  07/23/2020 10:11 AM  I, Yevette Edwards, am acting as a scribe for Dr. Sullivan Lone.   .I have reviewed the above documentation for accuracy and completeness, and I agree with the above.

## 2020-07-23 NOTE — Progress Notes (Signed)
CRITICAL VALUE STICKER  CRITICAL VALUE: AST 268 and ALT 278  RECEIVER (on-site recipient of call):Jaquilla Woodroof,RN  DATE & TIME NOTIFIED: 07/23/20 @ 0823  MESSENGER (representative from lab):Pam  MD NOTIFIED: Dr. Lindi Adie  TIME OF NOTIFICATION: 0830  RESPONSE: Will review w/collaborative RN

## 2020-07-23 NOTE — Progress Notes (Signed)
Contacted Dr. Irene Limbo related to patient elevated temp prior to start of D2 Gazyva. Per Dr. Irene Limbo we are to hold treatment today and he will see patient in infusion room at 9am as previously scheduled. Patient is aware of plan.  Dr. Irene Limbo assessed patient in infusion room. Blood cultures ordered x2, urine UA and culture, and patient encouraged to drink plenty of fluids.   Patient stable upon Dc and aware to go to the ED if symptoms worsen. She understands per Dr. Irene Limbo to take no more that 1000 mg Tylenol daily for fevers and ibuprofen preferred and to drink plenty of fluids. Patient ambulating to radiology to check in for CXR and possibly [place].

## 2020-07-26 ENCOUNTER — Encounter: Payer: Self-pay | Admitting: Hematology

## 2020-07-26 ENCOUNTER — Telehealth: Payer: Self-pay | Admitting: Hematology

## 2020-07-26 ENCOUNTER — Ambulatory Visit (HOSPITAL_COMMUNITY)
Admission: RE | Admit: 2020-07-26 | Discharge: 2020-07-26 | Disposition: A | Discharge status: Home / Self Care | Payer: 59 | Source: Ambulatory Visit | Attending: Hematology | Admitting: Hematology

## 2020-07-26 ENCOUNTER — Other Ambulatory Visit: Payer: Self-pay

## 2020-07-26 DIAGNOSIS — R509 Fever, unspecified: Secondary | ICD-10-CM

## 2020-07-26 DIAGNOSIS — C9110 Chronic lymphocytic leukemia of B-cell type not having achieved remission: Secondary | ICD-10-CM | POA: Diagnosis not present

## 2020-07-26 NOTE — Telephone Encounter (Signed)
Scheduled per providers request, patient is notified of 10/19 appointment.

## 2020-07-27 ENCOUNTER — Other Ambulatory Visit: Payer: Self-pay

## 2020-07-27 ENCOUNTER — Inpatient Hospital Stay: Payer: 59

## 2020-07-27 DIAGNOSIS — Z5112 Encounter for antineoplastic immunotherapy: Secondary | ICD-10-CM | POA: Diagnosis not present

## 2020-07-27 DIAGNOSIS — C9110 Chronic lymphocytic leukemia of B-cell type not having achieved remission: Secondary | ICD-10-CM

## 2020-07-27 DIAGNOSIS — R509 Fever, unspecified: Secondary | ICD-10-CM

## 2020-07-27 LAB — CBC WITH DIFFERENTIAL/PLATELET
Abs Immature Granulocytes: 0.01 10*3/uL (ref 0.00–0.07)
Basophils Absolute: 0 10*3/uL (ref 0.0–0.1)
Basophils Relative: 0 %
Eosinophils Absolute: 0.2 10*3/uL (ref 0.0–0.5)
Eosinophils Relative: 4 %
HCT: 37.6 % (ref 36.0–46.0)
Hemoglobin: 12.2 g/dL (ref 12.0–15.0)
Immature Granulocytes: 0 %
Lymphocytes Relative: 63 %
Lymphs Abs: 2.9 10*3/uL (ref 0.7–4.0)
MCH: 30.2 pg (ref 26.0–34.0)
MCHC: 32.4 g/dL (ref 30.0–36.0)
MCV: 93.1 fL (ref 80.0–100.0)
Monocytes Absolute: 0.3 10*3/uL (ref 0.1–1.0)
Monocytes Relative: 6 %
Neutro Abs: 1.3 10*3/uL — ABNORMAL LOW (ref 1.7–7.7)
Neutrophils Relative %: 27 %
Platelets: 81 10*3/uL — ABNORMAL LOW (ref 150–400)
RBC: 4.04 MIL/uL (ref 3.87–5.11)
RDW: 13.8 % (ref 11.5–15.5)
WBC: 4.7 10*3/uL (ref 4.0–10.5)
nRBC: 0 % (ref 0.0–0.2)

## 2020-07-27 LAB — CMP (CANCER CENTER ONLY)
ALT: 82 U/L — ABNORMAL HIGH (ref 0–44)
AST: 34 U/L (ref 15–41)
Albumin: 3.5 g/dL (ref 3.5–5.0)
Alkaline Phosphatase: 42 U/L (ref 38–126)
Anion gap: 8 (ref 5–15)
BUN: 18 mg/dL (ref 8–23)
CO2: 31 mmol/L (ref 22–32)
Calcium: 9.4 mg/dL (ref 8.9–10.3)
Chloride: 103 mmol/L (ref 98–111)
Creatinine: 0.93 mg/dL (ref 0.44–1.00)
GFR, Estimated: >60 mL/min (ref >60)
Glucose, Bld: 108 mg/dL — ABNORMAL HIGH (ref 70–99)
Potassium: 4.2 mmol/L (ref 3.5–5.1)
Sodium: 142 mmol/L (ref 135–145)
Total Bilirubin: 0.8 mg/dL (ref 0.3–1.2)
Total Protein: 6.2 g/dL — ABNORMAL LOW (ref 6.5–8.1)

## 2020-07-28 LAB — CULTURE, BLOOD (SINGLE)
Culture: NO GROWTH
Culture: NO GROWTH

## 2020-07-29 ENCOUNTER — Other Ambulatory Visit: Payer: Self-pay | Admitting: Hematology

## 2020-07-29 MED ORDER — DEXAMETHASONE 4 MG PO TABS
ORAL_TABLET | ORAL | 0 refills | Status: DC
Start: 2020-07-29 — End: 2020-09-06

## 2020-07-29 MED FILL — Dexamethasone Sodium Phosphate Inj 100 MG/10ML: INTRAMUSCULAR | Qty: 2 | Status: AC

## 2020-07-30 ENCOUNTER — Inpatient Hospital Stay: Payer: 59

## 2020-07-30 ENCOUNTER — Other Ambulatory Visit: Payer: Self-pay

## 2020-07-30 VITALS — BP 129/59 | HR 96 | Temp 98.8°F | Resp 18 | Wt 123.5 lb

## 2020-07-30 DIAGNOSIS — C9110 Chronic lymphocytic leukemia of B-cell type not having achieved remission: Secondary | ICD-10-CM

## 2020-07-30 DIAGNOSIS — R509 Fever, unspecified: Secondary | ICD-10-CM

## 2020-07-30 DIAGNOSIS — Z5112 Encounter for antineoplastic immunotherapy: Secondary | ICD-10-CM | POA: Diagnosis not present

## 2020-07-30 DIAGNOSIS — Z7189 Other specified counseling: Secondary | ICD-10-CM

## 2020-07-30 MED ORDER — ACETAMINOPHEN 325 MG PO TABS
ORAL_TABLET | ORAL | Status: AC
  Filled 2020-07-30: qty 2

## 2020-07-30 MED ORDER — MONTELUKAST SODIUM 10 MG PO TABS
10.0000 mg | ORAL_TABLET | Freq: Once | ORAL | Status: AC
  Administered 2020-07-30: 10 mg via ORAL

## 2020-07-30 MED ORDER — METHYLPREDNISOLONE SODIUM SUCC 125 MG IJ SOLR
INTRAMUSCULAR | Status: AC
  Filled 2020-07-30: qty 2

## 2020-07-30 MED ORDER — DIPHENHYDRAMINE HCL 50 MG/ML IJ SOLN
50.0000 mg | Freq: Once | INTRAMUSCULAR | Status: AC
  Administered 2020-07-30: 50 mg via INTRAVENOUS

## 2020-07-30 MED ORDER — SODIUM CHLORIDE 0.9 % IV SOLN
900.0000 mg | Freq: Once | INTRAVENOUS | Status: AC
  Administered 2020-07-30: 900 mg via INTRAVENOUS
  Filled 2020-07-30: qty 36

## 2020-07-30 MED ORDER — METHYLPREDNISOLONE SODIUM SUCC 125 MG IJ SOLR
125.0000 mg | Freq: Once | INTRAMUSCULAR | Status: AC
  Administered 2020-07-30: 125 mg via INTRAVENOUS

## 2020-07-30 MED ORDER — MONTELUKAST SODIUM 10 MG PO TABS
ORAL_TABLET | ORAL | Status: AC
  Filled 2020-07-30: qty 1

## 2020-07-30 MED ORDER — FAMOTIDINE IN NACL 20-0.9 MG/50ML-% IV SOLN
INTRAVENOUS | Status: AC
  Filled 2020-07-30: qty 50

## 2020-07-30 MED ORDER — ACETAMINOPHEN 325 MG PO TABS
650.0000 mg | ORAL_TABLET | Freq: Once | ORAL | Status: AC
  Administered 2020-07-30: 650 mg via ORAL

## 2020-07-30 MED ORDER — FAMOTIDINE IN NACL 20-0.9 MG/50ML-% IV SOLN
20.0000 mg | Freq: Once | INTRAVENOUS | Status: AC
  Administered 2020-07-30: 20 mg via INTRAVENOUS

## 2020-07-30 MED ORDER — DIPHENHYDRAMINE HCL 50 MG/ML IJ SOLN
INTRAMUSCULAR | Status: AC
  Filled 2020-07-30: qty 1

## 2020-07-30 MED ORDER — SODIUM CHLORIDE 0.9 % IV SOLN
Freq: Once | INTRAVENOUS | Status: AC
  Filled 2020-07-30: qty 250

## 2020-07-30 NOTE — Patient Instructions (Signed)
[facility] Discharge Instructions for Patients Receiving Chemotherapy  Today you received the following chemotherapy agents: Gazyva   To help prevent nausea and vomiting after your treatment, we encourage you to take your nausea medication as directed.    If you develop nausea and vomiting that is not controlled by your nausea medication, call the clinic.   BELOW ARE SYMPTOMS THAT SHOULD BE REPORTED IMMEDIATELY:  *FEVER GREATER THAN 100.5 F  *CHILLS WITH OR WITHOUT FEVER  NAUSEA AND VOMITING THAT IS NOT CONTROLLED WITH YOUR NAUSEA MEDICATION  *UNUSUAL SHORTNESS OF BREATH  *UNUSUAL BRUISING OR BLEEDING  TENDERNESS IN MOUTH AND THROAT WITH OR WITHOUT PRESENCE OF ULCERS  *URINARY PROBLEMS  *BOWEL PROBLEMS  UNUSUAL RASH Items with * indicate a potential emergency and should be followed up as soon as possible.  Feel free to call the clinic should you have any questions or concerns. The clinic phone number is [phone].  Please show the CHEMO ALERT CARD at check-in to the Emergency Department and triage nurse.   

## 2020-08-02 ENCOUNTER — Encounter: Payer: Self-pay | Admitting: Hematology

## 2020-08-03 ENCOUNTER — Encounter: Payer: Self-pay | Admitting: Hematology

## 2020-08-03 ENCOUNTER — Telehealth: Payer: Self-pay | Admitting: *Deleted

## 2020-08-03 ENCOUNTER — Other Ambulatory Visit: Payer: 59

## 2020-08-03 ENCOUNTER — Telehealth: Payer: Self-pay | Admitting: Hematology

## 2020-08-03 NOTE — Telephone Encounter (Signed)
Scheduled per 10/15 los, patient has been called and notified.

## 2020-08-03 NOTE — Telephone Encounter (Signed)
Contacted patient re: MyChart messages - she states she took ibuprofen one time and has not taken again. Temperature returned to normal.  Contacted scheduling with question regarding scheduling appointments for later this week.   08/02/20 "...I had my 2nd infusion on Friday and felt great all weekend until early this morning, 4-5am when I started running more fever. It was 102.1 and is presently down with 200mg  Ibuprofen, no other symptoms. I figured Dr Irene Limbo may want bloodwork drawn again at some point in preparation hopefully for treatment later this week, not yet scheduled. Thank you." 08/03/20 "...I was hoping to hear which day my next infusion would be scheduled. Ill send a message yesterday stating that I ran fever but not for long. I have been without fever now for almost 34 hours with no other problems. Please let me know if I will be treated on Thursday or Friday.

## 2020-08-03 NOTE — Telephone Encounter (Signed)
Contacted patient - she states she took ibuprofen one time and has not taken again. Temperature returned to normal.

## 2020-08-04 ENCOUNTER — Inpatient Hospital Stay: Payer: 59

## 2020-08-04 ENCOUNTER — Other Ambulatory Visit: Payer: Self-pay | Admitting: *Deleted

## 2020-08-04 ENCOUNTER — Other Ambulatory Visit: Payer: Self-pay

## 2020-08-04 DIAGNOSIS — R509 Fever, unspecified: Secondary | ICD-10-CM

## 2020-08-04 DIAGNOSIS — Z5112 Encounter for antineoplastic immunotherapy: Secondary | ICD-10-CM | POA: Diagnosis not present

## 2020-08-04 LAB — CMP (CANCER CENTER ONLY)
ALT: 29 U/L (ref 0–44)
AST: 22 U/L (ref 15–41)
Albumin: 3.6 g/dL (ref 3.5–5.0)
Alkaline Phosphatase: 41 U/L (ref 38–126)
Anion gap: 6 (ref 5–15)
BUN: 14 mg/dL (ref 8–23)
CO2: 29 mmol/L (ref 22–32)
Calcium: 9.1 mg/dL (ref 8.9–10.3)
Chloride: 105 mmol/L (ref 98–111)
Creatinine: 0.8 mg/dL (ref 0.44–1.00)
GFR, Estimated: >60 mL/min (ref >60)
Glucose, Bld: 112 mg/dL — ABNORMAL HIGH (ref 70–99)
Potassium: 4.1 mmol/L (ref 3.5–5.1)
Sodium: 140 mmol/L (ref 135–145)
Total Bilirubin: 1 mg/dL (ref 0.3–1.2)
Total Protein: 5.6 g/dL — ABNORMAL LOW (ref 6.5–8.1)

## 2020-08-04 LAB — CBC WITH DIFFERENTIAL (CANCER CENTER ONLY)
Abs Immature Granulocytes: 0 10*3/uL (ref 0.00–0.07)
Basophils Absolute: 0 10*3/uL (ref 0.0–0.1)
Basophils Relative: 1 %
Eosinophils Absolute: 0.1 10*3/uL (ref 0.0–0.5)
Eosinophils Relative: 3 %
HCT: 36 % (ref 36.0–46.0)
Hemoglobin: 11.8 g/dL — ABNORMAL LOW (ref 12.0–15.0)
Immature Granulocytes: 0 %
Lymphocytes Relative: 30 %
Lymphs Abs: 1 10*3/uL (ref 0.7–4.0)
MCH: 29.8 pg (ref 26.0–34.0)
MCHC: 32.8 g/dL (ref 30.0–36.0)
MCV: 90.9 fL (ref 80.0–100.0)
Monocytes Absolute: 0.3 10*3/uL (ref 0.1–1.0)
Monocytes Relative: 7 %
Neutro Abs: 2.1 10*3/uL (ref 1.7–7.7)
Neutrophils Relative %: 59 %
Platelet Count: 163 10*3/uL (ref 150–400)
RBC: 3.96 MIL/uL (ref 3.87–5.11)
RDW: 14.7 % (ref 11.5–15.5)
WBC Count: 3.5 10*3/uL — ABNORMAL LOW (ref 4.0–10.5)
nRBC: 0 % (ref 0.0–0.2)

## 2020-08-04 NOTE — Progress Notes (Incomplete)
HEMATOLOGY/ONCOLOGY CLINIC NOTE  Date of Service: 08/04/2020  Patient Care Team: Carol Ada, MD as PCP - General (Family Medicine) Troy Sine, MD as PCP - Cardiology (Cardiology)  CHIEF COMPLAINTS/PURPOSE OF CONSULTATION:  CLL  HISTORY OF PRESENTING ILLNESS:  Colleen Shannon is a wonderful 64 y.o. female who has been referred to [place] by Dr. Lindi Adie for evaluation and management of CLL. The pt reports that she is doing well overall.   The pt reports that she was being seen by Dr. Lindi Adie for Lobular Carcinoma in situ (LCIS) of the left breast. She had a lumpectomy and recently completed 5 years of Tamoxifen for breast cancer prevention. Pt was having frequent respiratory infections 3-4 years ago and after some labwork was found to have CLL. Dr. Lindi Adie has been monitoring this and pt has never required treatment. She has not had as many issues with recurrent infections in the last 2-3 years. She has had two SCC lesions found and removed. Her last was found 8-10 years ago.   She has felt well over the last 6-12 months. Pt has had lymphadenopathy over the years that often swells and shrinks rapidly. About two weeks ago she began to notice bilateral neck swelling. It was uncomfortable at times and affected her breathing. Her neck swelling is starting to settle down and is no longer causing her discomfort. She received her last vaccine (Falls) in March and has not had any infections recently. Pt has joint pain for which she saw Dr. Estanislado Pandy who did not think that the pt has RA.   01/31/2016 Peripheral Blood Flow Cytometry Report revealed "Chronic Lymphocytic Leukemia."  Most recent lab results (12/01/2019) of CBC is as follows: all values are WNL except for WBC at 43.4K, PLT at 145K, Lymphs Abs at 37.2K, Mono Abs at 3.0K, Baso Abs at 0.2K, Glucose at 135, Total Protein at 6.1, ALP at 37. 12/01/2019 LDH at 285  On review of systems, pt reports joint pain, improving lumps and denies  fevers, chills, sweats, fatigue, unexpected weight loss, abdominal pain and any other symptoms.   On PMHx the pt reports Lobular Carcinoma in situ, CLL, Lumpectomy, Arthritis, Pneumonia, SCC. 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  INTERVAL HISTORY: Colleen Shannon is a wonderful 64 y.o. female who is here for evaluation and management of CLL. The patient's last visit with [place] was on 07/23/2020. The pt reports that she is doing well overall.  The pt reports ***  Of note since the patient's last visit, pt has had *** completed on *** with results revealing ***.  Lab results today (08/04/20) of CBC w/diff and CMP is as follows: all values are WNL except for ***. 08/04/2020 LDH at ***  On review of systems, pt reports *** and denies ***and any other symptoms.   A&P: -Discussed pt labwork today, 08/04/20; *** -***  MEDICAL HISTORY:  Past Medical History:  Diagnosis Date  . Arthritis   . Breast CA ([place]) dx'd 2015   left  . Bronchiectasis ([place])   . Cancer ([facility])    hx of skin cancer   . CLL (chronic lymphocytic leukemia) ([place]) dx'd 2015  . Dyspnea   . Dysrhythmia    PVCs  . Elevated cholesterol 05/22/2012   Nuc stress test-Normal.  . Endometrial polyp   . GERD (gastroesophageal reflux disease)   . Grade I diastolic dysfunction    noted on echo  . History of palpitations   .  Hypertension 05/22/12   ECHO-EF >55% trace tricupsid regurgitation. There is mild mitral regurgitation.   . MR (mitral regurgitation)    mild noted on echo  . Plantar fasciitis of left foot    resolved  . Pneumonia    history of x 2  . PONV (postoperative nausea and vomiting)     SURGICAL HISTORY: Past Surgical History:  Procedure Laterality Date  . ABDOMINAL SURGERY     Abdominoplasty  . BRONCHIAL BIOPSY  06/29/2020   Procedure: BRONCHIAL BIOPSIES;  Surgeon: Collene Gobble, MD;  Location: [facility] ENDOSCOPY;  Service: Cardiopulmonary;;  . BRONCHIAL BRUSHINGS  06/29/2020    Procedure: BRONCHIAL BRUSHINGS;  Surgeon: Collene Gobble, MD;  Location: [facility];  Service: Cardiopulmonary;;  . BRONCHIAL WASHINGS  06/29/2020   Procedure: BRONCHIAL WASHINGS;  Surgeon: Collene Gobble, MD;  Location: [facility] ENDOSCOPY;  Service: Cardiopulmonary;;  . CESAREAN SECTION     X 2  . CHOLECYSTECTOMY    . COLONSCOPY    . DILATION AND CURETTAGE OF UTERUS    . FOOT SURGERY Left   . HYSTEROSCOPY    . KNEE ARTHROTOMY Right 06/04/2019   Procedure: Right knee arthrotomy; scar excision;  Surgeon: [name], MD;  Location: [facility];  Service: Orthopedics;  Laterality: Right;  13mn  . KNEE CLOSED REDUCTION Right 07/20/2014   Procedure: RIGHT KNEE CLOSED MANIPULATION;  Surgeon: FGearlean Alf MD;  Location: [facility];  Service: Orthopedics;  Laterality: Right;  . KNEE SURGERY Right    arthroscopic  . PARTIAL KNEE ARTHROPLASTY Left 2017  . RADIOACTIVE SEED GUIDED EXCISIONAL BREAST BIOPSY N/A 10/06/2014   Procedure: RADIOACTIVE SEED GUIDED EXCISIONAL BREAST BIOPSY;  Surgeon: MRolm Bookbinder MD;  [facility]  Service: General;  Laterality: N/A;  . REFRACTIVE SURGERY    . TOTAL KNEE ARTHROPLASTY Right 05/11/2014   Procedure: RIGHT TOTAL KNEE ARTHROPLASTY;  Surgeon: FGearlean Alf MD;  Location: [facility];  Service: Orthopedics;  Laterality: Right;  . TUBAL LIGATION    . VIDEO BRONCHOSCOPY N/A 06/29/2020   Procedure: VIDEO BRONCHOSCOPY WITHOUT FLUORO;  Surgeon: BCollene Gobble MD;  [facility];  Service: Cardiopulmonary;  Laterality: N/A;  with BAL    SOCIAL HISTORY: Social History   Socioeconomic History  . Marital status: Married    Spouse name: Not on file  . Number of children: Not on file  . Years of education: Not on file  . Highest education level: Not on file  Occupational History  . Not on file  Tobacco Use  . Smoking status: Never Smoker  . Smokeless tobacco: Never Used  Vaping Use  . Vaping Use: Never used  Substance and Sexual  Activity  . Alcohol use: Not Currently    Comment: occasional   . Drug use: No  . Sexual activity: Yes    Birth control/protection: Surgical, Post-menopausal    Comment: Hysterectomy  Other Topics Concern  . Not on file  Social History Narrative  . Not on file   Social Determinants of Health   Financial Resource Strain:   . Difficulty of Paying Living Expenses: Not on file  Food Insecurity:   . Worried About RCharity fundraiserin the Last Year: Not on file  . Ran Out of Food in the Last Year: Not on file  Transportation Needs:   . Lack of Transportation (Medical): Not on file  . Lack of Transportation (Non-Medical): Not on file  Physical Activity:   . Days of Exercise  per Week: Not on file  . Minutes of Exercise per Session: Not on file  Stress:   . Feeling of Stress : Not on file  Social Connections:   . Frequency of Communication with Friends and Family: Not on file  . Frequency of Social Gatherings with Friends and Family: Not on file  . Attends Religious Services: Not on file  . Active Member of Clubs or Organizations: Not on file  . Attends Archivist Meetings: Not on file  . Marital Status: Not on file  Intimate Partner Violence:   . Fear of Current or Ex-Partner: Not on file  . Emotionally Abused: Not on file  . Physically Abused: Not on file  . Sexually Abused: Not on file    FAMILY HISTORY: Family History  Problem Relation Age of Onset  . Heart disease Father   . Dementia Father   . Aneurysm Mother   . Hyperlipidemia Mother   . Alzheimer's disease Mother   . Osteoarthritis Mother   . Hypertension Brother   . High Cholesterol Brother   . Cancer - Other Paternal Grandmother        uterine  . Hypertension Paternal Grandfather   . Stroke Paternal Grandfather   . Heart disease Maternal Grandfather   . Cancer - Other Maternal Grandfather        lung  . Cushing syndrome Brother   . Healthy Son   . Healthy Daughter     ALLERGIES:  has No  Known Allergies.  MEDICATIONS:  Current Outpatient Medications  Medication Sig Dispense Refill  . albuterol (VENTOLIN HFA) 108 (90 Base) MCG/ACT inhaler Inhale 1-2 puffs into the lungs every 6 (six) hours as needed for wheezing or shortness of breath.    Marland Kitchen aspirin EC 81 MG tablet Take 81 mg by mouth every other day. Swallow whole.     Marland Kitchen atorvastatin (LIPITOR) 20 MG tablet Take 1 tablet (20 mg total) by mouth at bedtime.    . Calcium Carb-Cholecalciferol (CALCIUM-VITAMIN D3) 600-400 MG-UNIT TABS Take 1 tablet by mouth 2 (two) times daily.    . carvedilol (COREG) 12.5 MG tablet Take 0.5 tablets (6.25 mg total) by mouth 2 (two) times daily with a meal. 180 tablet 3  . cholecalciferol (VITAMIN D3) 25 MCG (1000 UNIT) tablet Take 1,000 Units by mouth in the morning and at bedtime.    . Cyanocobalamin (B-12) 2500 MCG TABS Take 2,500 mcg by mouth daily.    Marland Kitchen dexamethasone (DECADRON) 4 MG tablet Take 2 tabs (28m) the evening before and the morning after each treatment with Gazyva 20 tablet 0  . ezetimibe (ZETIA) 10 MG tablet TAKE 1 TABLET BY MOUTH AT  BEDTIME 90 tablet 3  . magnesium oxide (MAG-OX) 400 MG tablet Take 400 mg by mouth every other day.     . Multiple Vitamin (MULTIVITAMIN WITH MINERALS) TABS tablet Take 1 tablet by mouth daily.    . Omega-3 Fatty Acids (FISH OIL) 1200 MG CAPS Take 1,200 mg by mouth in the morning and at bedtime. 120 mg-180 mg    . Omeprazole 20 MG TBEC Take 20 mg by mouth in the morning.     . ondansetron (ZOFRAN) 8 MG tablet Take 1 tablet (8 mg total) by mouth every 8 (eight) hours as needed for nausea. 30 tablet 3  . Turmeric 500 MG TABS Take 1,000 mg by mouth daily.      No current facility-administered medications for this visit.    REVIEW OF SYSTEMS:  A 10+ POINT REVIEW OF SYSTEMS WAS OBTAINED including neurology, dermatology, psychiatry, cardiac, respiratory, lymph, extremities, GI, GU, Musculoskeletal, constitutional, breasts, reproductive, HEENT.  All pertinent  positives are noted in the HPI.  All others are negative.   PHYSICAL EXAMINATION: ECOG PERFORMANCE STATUS: 0 - Asymptomatic VS reviewed  *** GENERAL:alert, in no acute distress and comfortable SKIN: no acute rashes, no significant lesions EYES: conjunctiva are pink and non-injected, sclera anicteric OROPHARYNX: MMM, no exudates, no oropharyngeal erythema or ulceration NECK: supple, no JVD LYMPH:  no palpable lymphadenopathy in the cervical, axillary or inguinal regions LUNGS: clear to auscultation b/l with normal respiratory effort HEART: regular rate & rhythm ABDOMEN:  normoactive bowel sounds , non tender, not distended. No palpable hepatosplenomegaly.  Extremity: no pedal edema PSYCH: alert & oriented x 3 with fluent speech NEURO: no focal motor/sensory deficits  LABORATORY DATA:  I have reviewed the data as listed  . CBC Latest Ref Rng & Units 07/27/2020 07/23/2020 07/22/2020  WBC 4.0 - 10.5 K/uL 4.7 10.5 24.3(H)  Hemoglobin 12.0 - 15.0 g/dL 12.2 12.8 13.3  Hematocrit 36 - 46 % 37.6 38.9 41.6  Platelets 150 - 400 K/uL 81(L) 90(L) 181    . CMP Latest Ref Rng & Units 07/27/2020 07/23/2020 07/22/2020  Glucose 70 - 99 mg/dL 108(H) 170(H) 158(H)  BUN 8 - 23 mg/dL _0 Creatinine 0.44 - 1.00 mg/dL 0.93 0.73 0.75  Sodium 135 - 145 mmol/L 142 138 142  Potassium 3.5 - 5.1 mmol/L 4.2 4.2 3.6  Chloride 98 - 111 mmol/L 103 106 103  CO2 22 - 32 mmol/L _1 Calcium 8.9 - 10.3 mg/dL 9.4 8.8(L) 9.9  Total Protein 6.5 - 8.1 g/dL 6.2(L) 6.1(L) 6.4(L)  Total Bilirubin 0.3 - 1.2 mg/dL 0.8 0.7 0.6  Alkaline Phos 38 - 126 U/L 42 71 50  AST 15 - 41 U/L 34 268(HH) 42(H)  ALT 0 - 44 U/L 82(H) 278(HH) 45(H)      03/03/2020 FISH (CLL Prognosis) Panel:     RADIOGRAPHIC STUDIES: I have personally reviewed the radiological images as listed and agreed with the findings in the report. DG Chest 2 View  Result Date: 07/25/2020 CLINICAL DATA:  Fever, on chemotherapy for  leukemia EXAM: CHEST - 2 VIEW COMPARISON:  07/05/2020 FINDINGS: Lungs are clear.  No pleural effusion or pneumothorax. The heart is normal in size. Visualized osseous structures are within normal limits. Cholecystectomy clips. IMPRESSION: Normal chest radiographs. Electronically Signed   By: Julian Hy M.D.   On: 07/25/2020 05:53   DG Chest 2 View  Result Date: 07/05/2020 CLINICAL DATA:  Shortness of breath and fevers EXAM: CHEST - 2 VIEW COMPARISON:  06/08/2020 FINDINGS: Cardiac shadow is within normal limits. The lungs are well aerated bilaterally. Patchy left midlung infiltrate is seen projecting in the left upper lobe consistent with acute pneumonia. No other focal abnormality is seen. IMPRESSION: Left-sided pneumonia. Electronically Signed   By: Inez Catalina M.D.   On: 07/05/2020 13:38   US Abdomen Complete  Result Date: 07/26/2020 CLINICAL DATA:  Chronic lymphocytic leukemia. Fever. Previous cholecystectomy. EXAM: ABDOMEN ULTRASOUND COMPLETE COMPARISON:  CT 06/09/2019 FINDINGS: Gallbladder: Surgically absent Common bile duct: Diameter: 3.4 mm, normal Liver: No focal lesion identified. Within normal limits in parenchymal echogenicity. Portal vein is patent on color Doppler imaging with normal direction of blood flow towards the liver. Enlarged lymph nodes visible in the porta hepatis as shown by previous CT. IVC: No abnormality visualized. Pancreas: Visualized portion  unremarkable. Spleen: Size and appearance within normal limits.  12 cm in length. Right Kidney: Length: 12 cm. Echogenicity within normal limits. No mass or hydronephrosis visualized. Left Kidney: Length: 11.5 cm. Echogenicity within normal limits. No mass or hydronephrosis visualized. Abdominal aorta: No aneurysm visualized. Other findings: None. IMPRESSION: Previous cholecystectomy. Enlarged lymph nodes in the porta hepatis as shown by previous CT. Otherwise normal abdominal ultrasound. No abnormality seen to explain the clinical  presentation. Electronically Signed   By: Nelson Chimes M.D.   On: 07/26/2020 16:10    ASSESSMENT & PLAN:   64 yo with   1) Rai Stage 1 Chronic lymphocytic Leukemia. Found to have trisomy 12 and 13q deletion via 03/03/2020 FISH (CLL Prognosis) Panel  2) Fever 3) Abnormal LFTs PLAN: ***  -The pt has no prohibitive toxicities from continuing C1D8 of Gazyva at this time.***  2) Neoplasm of left breast, primary tumor staging category Tis: lobular carcinoma in situ (LCIS) Left breast LCIS status post lumpectomy 10/06/2014; Started tamoxifen for breast cancer risk reduction 20 mg daily 11/18/2014 ,decrease to 10 mg on 11/22/2017 completed February 2021.  Breast cancer surveillance: Mammogram and ultrasound 8/11/2020at [facility]: Benign breast density category B Breast exam 02/23/2020: Benign  PLAN -continue breast self examinations -MMG Q12 months - next in aug 2021   FOLLOW UP: ***   The total time spent in the appt was *** minutes and more than 50% was on counseling and direct patient cares.  All of the patient's questions were answered with apparent satisfaction. The patient knows to call the clinic with any problems, questions or concerns.    Sullivan Lone MD [place] AAHIVMS [facility] Hematology/Oncology Physician [facility]  (Office):       [phone] (Work cell):  [phone] (Fax):           [phone]  08/04/2020 9:15 AM  I, Yevette Edwards, am acting as a scribe for Dr. Sullivan Lone.   {Add Barista Statement}

## 2020-08-04 NOTE — Telephone Encounter (Signed)
Contacted patient per Dr. Grier Mitts directions - patient coming to lab today 10/27 to have lab work completed as ordered by [employer]

## 2020-08-05 ENCOUNTER — Other Ambulatory Visit: Payer: Self-pay | Admitting: *Deleted

## 2020-08-05 ENCOUNTER — Encounter: Payer: Self-pay | Admitting: Hematology

## 2020-08-06 ENCOUNTER — Inpatient Hospital Stay: Payer: 59 | Admitting: Hematology

## 2020-08-06 ENCOUNTER — Other Ambulatory Visit: Payer: Self-pay

## 2020-08-06 ENCOUNTER — Ambulatory Visit: Payer: 59

## 2020-08-06 ENCOUNTER — Inpatient Hospital Stay: Payer: 59

## 2020-08-06 VITALS — BP 124/60 | HR 82 | Temp 98.2°F | Resp 16

## 2020-08-06 DIAGNOSIS — Z5112 Encounter for antineoplastic immunotherapy: Secondary | ICD-10-CM | POA: Diagnosis not present

## 2020-08-06 DIAGNOSIS — Z7189 Other specified counseling: Secondary | ICD-10-CM

## 2020-08-06 DIAGNOSIS — C9110 Chronic lymphocytic leukemia of B-cell type not having achieved remission: Secondary | ICD-10-CM

## 2020-08-06 MED ORDER — ACETAMINOPHEN 325 MG PO TABS
650.0000 mg | ORAL_TABLET | Freq: Once | ORAL | Status: AC
  Administered 2020-08-06: 650 mg via ORAL

## 2020-08-06 MED ORDER — DIPHENHYDRAMINE HCL 50 MG/ML IJ SOLN
50.0000 mg | Freq: Once | INTRAMUSCULAR | Status: AC
  Administered 2020-08-06: 50 mg via INTRAVENOUS

## 2020-08-06 MED ORDER — FAMOTIDINE IN NACL 20-0.9 MG/50ML-% IV SOLN
20.0000 mg | Freq: Once | INTRAVENOUS | Status: AC
  Administered 2020-08-06: 20 mg via INTRAVENOUS

## 2020-08-06 MED ORDER — SODIUM CHLORIDE 0.9 % IV SOLN
Freq: Once | INTRAVENOUS | Status: AC
  Filled 2020-08-06: qty 250

## 2020-08-06 MED ORDER — METHYLPREDNISOLONE SODIUM SUCC 125 MG IJ SOLR
125.0000 mg | Freq: Once | INTRAMUSCULAR | Status: AC
  Administered 2020-08-06: 125 mg via INTRAVENOUS

## 2020-08-06 MED ORDER — MONTELUKAST SODIUM 10 MG PO TABS
10.0000 mg | ORAL_TABLET | Freq: Once | ORAL | Status: AC
  Administered 2020-08-06: 10 mg via ORAL

## 2020-08-06 MED ORDER — SODIUM CHLORIDE 0.9 % IV SOLN
1000.0000 mg | Freq: Once | INTRAVENOUS | Status: AC
  Administered 2020-08-06: 1000 mg via INTRAVENOUS
  Filled 2020-08-06: qty 40

## 2020-08-06 NOTE — Patient Instructions (Signed)
[facility] Discharge Instructions for Patients Receiving Chemotherapy  Today you received the following chemotherapy agents: Gazyva   To help prevent nausea and vomiting after your treatment, we encourage you to take your nausea medication as directed.    If you develop nausea and vomiting that is not controlled by your nausea medication, call the clinic.   BELOW ARE SYMPTOMS THAT SHOULD BE REPORTED IMMEDIATELY:  *FEVER GREATER THAN 100.5 F  *CHILLS WITH OR WITHOUT FEVER  NAUSEA AND VOMITING THAT IS NOT CONTROLLED WITH YOUR NAUSEA MEDICATION  *UNUSUAL SHORTNESS OF BREATH  *UNUSUAL BRUISING OR BLEEDING  TENDERNESS IN MOUTH AND THROAT WITH OR WITHOUT PRESENCE OF ULCERS  *URINARY PROBLEMS  *BOWEL PROBLEMS  UNUSUAL RASH Items with * indicate a potential emergency and should be followed up as soon as possible.  Feel free to call the clinic should you have any questions or concerns. The clinic phone number is [phone].  Please show the CHEMO ALERT CARD at check-in to the Emergency Department and triage nurse.   

## 2020-08-06 NOTE — Progress Notes (Signed)
Verbal order per Dr.Kale: may start treatment now - before MD appointment at 11am today

## 2020-08-10 ENCOUNTER — Telehealth: Payer: Self-pay | Admitting: Hematology

## 2020-08-10 ENCOUNTER — Other Ambulatory Visit: Payer: Self-pay | Admitting: Hematology

## 2020-08-10 NOTE — Telephone Encounter (Signed)
Scheduled appt per 11/2 sch msg - pt is aware of apt date and time on 11/18

## 2020-08-10 NOTE — Telephone Encounter (Signed)
Called [name] to discuss her tolerance of cycle 1 day 15 of Gazyva.  She notes that she is feeling very well and has had no further fevers or other new symptoms.  Scheduling message has been sent to set her up for cycle 2-day 1 of Gazyva with labs and MD visit on 08/26/2020 and to scheduled cancel labs and MD visit on 08/30/2020  Sullivan Lone MD [place] [facility]IV[facility] Hematology/Oncology Physician [place]

## 2020-08-12 ENCOUNTER — Encounter: Payer: Self-pay | Admitting: Hematology

## 2020-08-25 ENCOUNTER — Other Ambulatory Visit: Payer: Self-pay | Admitting: *Deleted

## 2020-08-25 DIAGNOSIS — C9110 Chronic lymphocytic leukemia of B-cell type not having achieved remission: Secondary | ICD-10-CM

## 2020-08-25 NOTE — Progress Notes (Signed)
HEMATOLOGY/ONCOLOGY CLINIC NOTE  Date of Service: 08/26/2020  Patient Care Team: Carol Ada, MD as PCP - General (Family Medicine) Troy Sine, MD as PCP - Cardiology (Cardiology)  CHIEF COMPLAINTS/PURPOSE OF CONSULTATION:  CLL  HISTORY OF PRESENTING ILLNESS:  Colleen Shannon is a wonderful 64 y.o. female who has been referred to [place] by Dr. Lindi Adie for evaluation and management of CLL. The pt reports that she is doing well overall.   The pt reports that she was being seen by Dr. Lindi Adie for Lobular Carcinoma in situ (LCIS) of the left breast. She had a lumpectomy and recently completed 5 years of Tamoxifen for breast cancer prevention. Pt was having frequent respiratory infections 3-4 years ago and after some labwork was found to have CLL. Dr. Lindi Adie has been monitoring this and pt has never required treatment. She has not had as many issues with recurrent infections in the last 2-3 years. She has had two SCC lesions found and removed. Her last was found 8-10 years ago.   She has felt well over the last 6-12 months. Pt has had lymphadenopathy over the years that often swells and shrinks rapidly. About two weeks ago she began to notice bilateral neck swelling. It was uncomfortable at times and affected her breathing. Her neck swelling is starting to settle down and is no longer causing her discomfort. She received her last vaccine ([place]) in March and has not had any infections recently. Pt has joint pain for which she saw Dr. Estanislado Pandy who did not think that the pt has RA.   01/31/2016 Peripheral Blood Flow Cytometry Report revealed "Chronic Lymphocytic Leukemia."  Most recent lab results (12/01/2019) of CBC is as follows: all values are WNL except for WBC at 43.4K, PLT at 145K, Lymphs Abs at 37.2K, Mono Abs at 3.0K, Baso Abs at 0.2K, Glucose at 135, Total Protein at 6.1, ALP at 37. 12/01/2019 LDH at 285  On review of systems, pt reports joint pain, improving lumps and denies  fevers, chills, sweats, fatigue, unexpected weight loss, abdominal pain and any other symptoms.   On PMHx the pt reports Lobular Carcinoma in situ, CLL, Lumpectomy, Arthritis, Pneumonia, SCC. 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  INTERVAL HISTORY:  Colleen Shannon is a wonderful 64 y.o. female who is here for evaluation and management of CLL. The patient's last visit with [place] was on 07/23/2020. The pt reports that she is doing well overall.  The pt reports that she had a fever six days after treatment that resolved with Advil x 1. Pt denies any other symptoms or concerns in the interim. Pt is planning on traveling for the holidays and has precautions in place.   Lab results today (08/26/20) of CBC w/diff and CMP is as follows: all values are WNL except for Glucose at 178, ALT at 67.  On review of systems, pt reports weight gain and denies cough, dysuria, diarrhea, constipation, new back pain and any other symptoms.   MEDICAL HISTORY:  Past Medical History:  Diagnosis Date  . Arthritis   . Breast CA ([place]) dx'd 2015   left  . Bronchiectasis ([place])   . Cancer (H[facility])    hx of skin cancer   . CLL (chronic lymphocytic leukemia) ([place]) dx'd 2015  . Dyspnea   . Dysrhythmia    PVCs  . Elevated cholesterol 05/22/2012   Nuc stress test-Normal.  . Endometrial polyp   . GERD (gastroesophageal reflux disease)   .  Grade I diastolic dysfunction    noted on echo  . History of palpitations   . Hypertension 05/22/12   ECHO-EF >55% trace tricupsid regurgitation. There is mild mitral regurgitation.   . MR (mitral regurgitation)    mild noted on echo  . Plantar fasciitis of left foot    resolved  . Pneumonia    history of x 2  . PONV (postoperative nausea and vomiting)     SURGICAL HISTORY: Past Surgical History:  Procedure Laterality Date  . ABDOMINAL SURGERY     Abdominoplasty  . BRONCHIAL BIOPSY  06/29/2020   Procedure: BRONCHIAL BIOPSIES;  Surgeon: Collene Gobble, MD;  Location: [facility] ENDOSCOPY;  Service: Cardiopulmonary;;  . BRONCHIAL BRUSHINGS  06/29/2020   Procedure: BRONCHIAL BRUSHINGS;  Surgeon: Collene Gobble, MD;  Location: [facility];  Service: Cardiopulmonary;;  . BRONCHIAL WASHINGS  06/29/2020   Procedure: BRONCHIAL WASHINGS;  Surgeon: Collene Gobble, MD;  Location: [facility] ENDOSCOPY;  Service: Cardiopulmonary;;  . CESAREAN SECTION     X 2  . CHOLECYSTECTOMY    . COLONSCOPY    . DILATION AND CURETTAGE OF UTERUS    . FOOT SURGERY Left   . HYSTEROSCOPY    . KNEE ARTHROTOMY Right 06/04/2019   Procedure: Right knee arthrotomy; scar excision;  Surgeon: [name], MD;  Location: [facility] ORS;  Service: Orthopedics;  Laterality: Right;  53mn  . KNEE CLOSED REDUCTION Right 07/20/2014   Procedure: RIGHT KNEE CLOSED MANIPULATION;  Surgeon: FGearlean Alf MD;  Location: [facility] ORS;  Service: Orthopedics;  Laterality: Right;  . KNEE SURGERY Right    arthroscopic  . PARTIAL KNEE ARTHROPLASTY Left 2017  . RADIOACTIVE SEED GUIDED EXCISIONAL BREAST BIOPSY N/A 10/06/2014   Procedure: RADIOACTIVE SEED GUIDED EXCISIONAL BREAST BIOPSY;  Surgeon: MRolm Bookbinder MD;  [facility]  Service: General;  Laterality: N/A;  . REFRACTIVE SURGERY    . TOTAL KNEE ARTHROPLASTY Right 05/11/2014   Procedure: RIGHT TOTAL KNEE ARTHROPLASTY;  Surgeon: FGearlean Alf MD;  Location: [facility] ORS;  Service: Orthopedics;  Laterality: Right;  . TUBAL LIGATION    . VIDEO BRONCHOSCOPY N/A 06/29/2020   Procedure: VIDEO BRONCHOSCOPY WITHOUT FLUORO;  Surgeon: BCollene Gobble MD;  [facility];  Service: Cardiopulmonary;  Laterality: N/A;  with BAL    SOCIAL HISTORY: Social History   Socioeconomic History  . Marital status: Married    Spouse name: Not on file  . Number of children: Not on file  . Years of education: Not on file  . Highest education level: Not on file  Occupational History  . Not on file  Tobacco Use  . Smoking status: Never  Smoker  . Smokeless tobacco: Never Used  Vaping Use  . Vaping Use: Never used  Substance and Sexual Activity  . Alcohol use: Not Currently    Comment: occasional   . Drug use: No  . Sexual activity: Yes    Birth control/protection: Surgical, Post-menopausal    Comment: Hysterectomy  Other Topics Concern  . Not on file  Social History Narrative  . Not on file   Social Determinants of Health   Financial Resource Strain:   . Difficulty of Paying Living Expenses: Not on file  Food Insecurity:   . Worried About RCharity fundraiserin the Last Year: Not on file  . Ran Out of Food in the Last Year: Not on file  Transportation Needs:   . Lack of Transportation (Medical): Not on file  .  Lack of Transportation (Non-Medical): Not on file  Physical Activity:   . Days of Exercise per Week: Not on file  . Minutes of Exercise per Session: Not on file  Stress:   . Feeling of Stress : Not on file  Social Connections:   . Frequency of Communication with Friends and Family: Not on file  . Frequency of Social Gatherings with Friends and Family: Not on file  . Attends Religious Services: Not on file  . Active Member of Clubs or Organizations: Not on file  . Attends Archivist Meetings: Not on file  . Marital Status: Not on file  Intimate Partner Violence:   . Fear of Current or Ex-Partner: Not on file  . Emotionally Abused: Not on file  . Physically Abused: Not on file  . Sexually Abused: Not on file    FAMILY HISTORY: Family History  Problem Relation Age of Onset  . Heart disease Father   . Dementia Father   . Aneurysm Mother   . Hyperlipidemia Mother   . Alzheimer's disease Mother   . Osteoarthritis Mother   . Hypertension Brother   . High Cholesterol Brother   . Cancer - Other Paternal Grandmother        uterine  . Hypertension Paternal Grandfather   . Stroke Paternal Grandfather   . Heart disease Maternal Grandfather   . Cancer - Other Maternal Grandfather         lung  . Cushing syndrome Brother   . Healthy Son   . Healthy Daughter     ALLERGIES:  has No Known Allergies.  MEDICATIONS:  Current Outpatient Medications  Medication Sig Dispense Refill  . albuterol (VENTOLIN HFA) 108 (90 Base) MCG/ACT inhaler Inhale 1-2 puffs into the lungs every 6 (six) hours as needed for wheezing or shortness of breath.    Marland Kitchen amLODipine (NORVASC) 5 MG tablet Take 5 mg by mouth daily.    Marland Kitchen aspirin EC 81 MG tablet Take 81 mg by mouth every other day. Swallow whole.     Marland Kitchen atorvastatin (LIPITOR) 20 MG tablet Take 1 tablet (20 mg total) by mouth at bedtime.    . Calcium Carb-Cholecalciferol (CALCIUM-VITAMIN D3) 600-400 MG-UNIT TABS Take 1 tablet by mouth 2 (two) times daily.    . carvedilol (COREG) 12.5 MG tablet Take 0.5 tablets (6.25 mg total) by mouth 2 (two) times daily with a meal. 180 tablet 3  . cholecalciferol (VITAMIN D3) 25 MCG (1000 UNIT) tablet Take 1,000 Units by mouth in the morning and at bedtime.    . Cyanocobalamin (B-12) 2500 MCG TABS Take 2,500 mcg by mouth daily.    Marland Kitchen dexamethasone (DECADRON) 4 MG tablet Take 2 tabs (83m) the evening before and the morning after each treatment with Gazyva 20 tablet 0  . ezetimibe (ZETIA) 10 MG tablet TAKE 1 TABLET BY MOUTH AT  BEDTIME 90 tablet 3  . magnesium oxide (MAG-OX) 400 MG tablet Take 400 mg by mouth every other day.     . Multiple Vitamin (MULTIVITAMIN WITH MINERALS) TABS tablet Take 1 tablet by mouth daily.    . Omega-3 Fatty Acids (FISH OIL) 1200 MG CAPS Take 1,200 mg by mouth in the morning and at bedtime. 120 mg-180 mg    . Omeprazole 20 MG TBEC Take 20 mg by mouth in the morning.     . ondansetron (ZOFRAN) 8 MG tablet Take 1 tablet (8 mg total) by mouth every 8 (eight) hours as needed for nausea. 30 tablet  3  . Turmeric 500 MG TABS Take 1,000 mg by mouth daily.      No current facility-administered medications for this visit.    REVIEW OF SYSTEMS:   A 10+ POINT REVIEW OF SYSTEMS WAS OBTAINED  including neurology, dermatology, psychiatry, cardiac, respiratory, lymph, extremities, GI, GU, Musculoskeletal, constitutional, breasts, reproductive, HEENT.  All pertinent positives are noted in the HPI.  All others are negative.   PHYSICAL EXAMINATION: ECOG PERFORMANCE STATUS: 0 - Asymptomatic VS reviewed  GENERAL:alert, in no acute distress and comfortable SKIN: no acute rashes, no significant lesions EYES: conjunctiva are pink and non-injected, sclera anicteric OROPHARYNX: MMM, no exudates, no oropharyngeal erythema or ulceration NECK: supple, no JVD LYMPH:  no palpable lymphadenopathy in the cervical, axillary or inguinal regions LUNGS: clear to auscultation b/l with normal respiratory effort HEART: regular rate & rhythm ABDOMEN:  normoactive bowel sounds , non tender, not distended. No palpable hepatosplenomegaly.  Extremity: no pedal edema PSYCH: alert & oriented x 3 with fluent speech NEURO: no focal motor/sensory deficits  LABORATORY DATA:  I have reviewed the data as listed  . CBC Latest Ref Rng & Units 08/26/2020 08/04/2020 07/27/2020  WBC 4.0 - 10.5 K/uL 5.6 3.5(L) 4.7  Hemoglobin 12.0 - 15.0 g/dL 12.7 11.8(L) 12.2  Hematocrit 36 - 46 % 37.6 36.0 37.6  Platelets 150 - 400 K/uL 205 163 81(L)    . CMP Latest Ref Rng & Units 08/26/2020 08/04/2020 07/27/2020  Glucose 70 - 99 mg/dL 178(H) 112(H) 108(H)  BUN 8 - 23 mg/dL _0 Creatinine 0.44 - 1.00 mg/dL 0.73 0.80 0.93  Sodium 135 - 145 mmol/L 140 140 142  Potassium 3.5 - 5.1 mmol/L 3.8 4.1 4.2  Chloride 98 - 111 mmol/L 105 105 103  CO2 22 - 32 mmol/L _1 Calcium 8.9 - 10.3 mg/dL 9.5 9.1 9.4  Total Protein 6.5 - 8.1 g/dL 6.6 5.6(L) 6.2(L)  Total Bilirubin 0.3 - 1.2 mg/dL 0.7 1.0 0.8  Alkaline Phos 38 - 126 U/L 44 41 42  AST 15 - 41 U/L 41 22 34  ALT 0 - 44 U/L 67(H) 29 82(H)      03/03/2020 FISH (CLL Prognosis) Panel:     RADIOGRAPHIC STUDIES: I have personally reviewed the radiological images  as listed and agreed with the findings in the report. No results found.  ASSESSMENT & PLAN:   64 yo with   1) Rai Stage 1 Chronic lymphocytic Leukemia. Found to have trisomy 12 and 13q deletion via 03/03/2020 FISH (CLL Prognosis) Panel  2) Fever 3) Abnormal LFTs PLAN: -Discussed pt labwork today, 08/26/20; blood counts are nml, leukocytosis has resolved, ALT is elevated, other blood chemistries are nml.  -The pt has no prohibitive toxicities from continuing C2D1 of Gazyva at this time. Plan to complete up to 6 cycles. -Advised pt that there is less risk of tumor lysis with Venetoclax as she has already received one cycle of Gazyva. -Plan to begin Venetoclax in two weeks. Will gradually increase to 200 mg and hold for the first month.  -Advised pt that we would continue Venetoclax for up to one year. -Will begin weekly labs x4 in 2 weeks.  -Refill Dexamethasone -Rx Allopurinol, Venetoclax -Will see back in 4 weeks with labs   2) Neoplasm of left breast, primary tumor staging category Tis: lobular carcinoma in situ (LCIS) Left breast LCIS status post lumpectomy 10/06/2014; Started tamoxifen for breast cancer risk reduction 20 mg daily 11/18/2014 ,decrease to 10 mg  on 11/22/2017 completed February 2021.  Breast cancer surveillance: Mammogram and ultrasound 8/11/2020at [facility]: Benign breast density category B Breast exam 02/23/2020: Benign  PLAN -continue breast self examinations -MMG Q12 months - next in aug 2021   FOLLOW UP: Plz schedule C3 of Gazyva with labs and MD visit on C3D1 in 4 weeks Weekly labs starting 12/2 x 4 for monitoring venetoclax.   The total time spent in the appt was 30 minutes and more than 50% was on counseling and direct patient cares, ordering and mx of Gazyva, ordering and co-ordination of care for setting up Venetoclax rx  All of the patient's questions were answered with apparent satisfaction. The patient knows to call the clinic with any problems,  questions or concerns.    Sullivan Lone MD [place] AAHIVMS [facility] Hematology/Oncology Physician [facility]  (Office):       [phone] (Work cell):  [phone] (Fax):           [phone]  08/26/2020 11:45 AM  I, Yevette Edwards, am acting as a scribe for Dr. Sullivan Lone.   .I have reviewed the above documentation for accuracy and completeness, and I agree with the above. Brunetta Genera MD

## 2020-08-26 ENCOUNTER — Inpatient Hospital Stay (HOSPITAL_BASED_OUTPATIENT_CLINIC_OR_DEPARTMENT_OTHER): Payer: 59 | Admitting: Hematology

## 2020-08-26 ENCOUNTER — Inpatient Hospital Stay: Payer: 59 | Attending: Hematology and Oncology

## 2020-08-26 ENCOUNTER — Inpatient Hospital Stay: Payer: 59

## 2020-08-26 ENCOUNTER — Other Ambulatory Visit: Payer: Self-pay

## 2020-08-26 VITALS — BP 139/66 | HR 94 | Temp 99.2°F | Resp 18 | Ht 61.0 in | Wt 129.6 lb

## 2020-08-26 VITALS — BP 130/54 | HR 84 | Temp 98.4°F | Resp 18

## 2020-08-26 DIAGNOSIS — Z7189 Other specified counseling: Secondary | ICD-10-CM

## 2020-08-26 DIAGNOSIS — Z23 Encounter for immunization: Secondary | ICD-10-CM | POA: Diagnosis not present

## 2020-08-26 DIAGNOSIS — C9110 Chronic lymphocytic leukemia of B-cell type not having achieved remission: Secondary | ICD-10-CM | POA: Diagnosis not present

## 2020-08-26 DIAGNOSIS — R509 Fever, unspecified: Secondary | ICD-10-CM | POA: Diagnosis not present

## 2020-08-26 DIAGNOSIS — Z5112 Encounter for antineoplastic immunotherapy: Secondary | ICD-10-CM | POA: Diagnosis not present

## 2020-08-26 LAB — CBC WITH DIFFERENTIAL (CANCER CENTER ONLY)
Abs Immature Granulocytes: 0.02 10*3/uL (ref 0.00–0.07)
Basophils Absolute: 0 10*3/uL (ref 0.0–0.1)
Basophils Relative: 0 %
Eosinophils Absolute: 0 10*3/uL (ref 0.0–0.5)
Eosinophils Relative: 0 %
HCT: 37.6 % (ref 36.0–46.0)
Hemoglobin: 12.7 g/dL (ref 12.0–15.0)
Immature Granulocytes: 0 %
Lymphocytes Relative: 14 %
Lymphs Abs: 0.8 10*3/uL (ref 0.7–4.0)
MCH: 30 pg (ref 26.0–34.0)
MCHC: 33.8 g/dL (ref 30.0–36.0)
MCV: 88.9 fL (ref 80.0–100.0)
Monocytes Absolute: 0.2 10*3/uL (ref 0.1–1.0)
Monocytes Relative: 3 %
Neutro Abs: 4.6 10*3/uL (ref 1.7–7.7)
Neutrophils Relative %: 83 %
Platelet Count: 205 10*3/uL (ref 150–400)
RBC: 4.23 MIL/uL (ref 3.87–5.11)
RDW: 14.9 % (ref 11.5–15.5)
WBC Count: 5.6 10*3/uL (ref 4.0–10.5)
nRBC: 0 % (ref 0.0–0.2)

## 2020-08-26 LAB — CMP (CANCER CENTER ONLY)
ALT: 67 U/L — ABNORMAL HIGH (ref 0–44)
AST: 41 U/L (ref 15–41)
Albumin: 4.1 g/dL (ref 3.5–5.0)
Alkaline Phosphatase: 44 U/L (ref 38–126)
Anion gap: 9 (ref 5–15)
BUN: 19 mg/dL (ref 8–23)
CO2: 26 mmol/L (ref 22–32)
Calcium: 9.5 mg/dL (ref 8.9–10.3)
Chloride: 105 mmol/L (ref 98–111)
Creatinine: 0.73 mg/dL (ref 0.44–1.00)
GFR, Estimated: >60 mL/min (ref >60)
Glucose, Bld: 178 mg/dL — ABNORMAL HIGH (ref 70–99)
Potassium: 3.8 mmol/L (ref 3.5–5.1)
Sodium: 140 mmol/L (ref 135–145)
Total Bilirubin: 0.7 mg/dL (ref 0.3–1.2)
Total Protein: 6.6 g/dL (ref 6.5–8.1)

## 2020-08-26 MED ORDER — METHYLPREDNISOLONE SODIUM SUCC 125 MG IJ SOLR
125.0000 mg | Freq: Once | INTRAMUSCULAR | Status: AC
  Administered 2020-08-26: 125 mg via INTRAVENOUS

## 2020-08-26 MED ORDER — SODIUM CHLORIDE 0.9 % IV SOLN
Freq: Once | INTRAVENOUS | Status: AC
  Filled 2020-08-26: qty 250

## 2020-08-26 MED ORDER — LORATADINE 10 MG PO TABS
ORAL_TABLET | ORAL | Status: AC
  Filled 2020-08-26: qty 1

## 2020-08-26 MED ORDER — DIPHENHYDRAMINE HCL 50 MG/ML IJ SOLN
50.0000 mg | Freq: Once | INTRAMUSCULAR | Status: AC
  Administered 2020-08-26: 50 mg via INTRAVENOUS

## 2020-08-26 MED ORDER — MONTELUKAST SODIUM 10 MG PO TABS
10.0000 mg | ORAL_TABLET | Freq: Once | ORAL | Status: AC
  Administered 2020-08-26: 10 mg via ORAL

## 2020-08-26 MED ORDER — SODIUM CHLORIDE 0.9 % IV SOLN
1000.0000 mg | Freq: Once | INTRAVENOUS | Status: AC
  Administered 2020-08-26: 1000 mg via INTRAVENOUS
  Filled 2020-08-26: qty 40

## 2020-08-26 MED ORDER — FAMOTIDINE IN NACL 20-0.9 MG/50ML-% IV SOLN
INTRAVENOUS | Status: AC
  Filled 2020-08-26: qty 50

## 2020-08-26 MED ORDER — DIPHENHYDRAMINE HCL 50 MG/ML IJ SOLN
INTRAMUSCULAR | Status: AC
  Filled 2020-08-26: qty 1

## 2020-08-26 MED ORDER — MONTELUKAST SODIUM 10 MG PO TABS
ORAL_TABLET | ORAL | Status: AC
  Filled 2020-08-26: qty 1

## 2020-08-26 MED ORDER — ACETAMINOPHEN 325 MG PO TABS
ORAL_TABLET | ORAL | Status: AC
  Filled 2020-08-26: qty 2

## 2020-08-26 MED ORDER — ACETAMINOPHEN 325 MG PO TABS
650.0000 mg | ORAL_TABLET | Freq: Once | ORAL | Status: AC
  Administered 2020-08-26: 650 mg via ORAL

## 2020-08-26 MED ORDER — METHYLPREDNISOLONE SODIUM SUCC 125 MG IJ SOLR
INTRAMUSCULAR | Status: AC
  Filled 2020-08-26: qty 2

## 2020-08-26 MED ORDER — FAMOTIDINE IN NACL 20-0.9 MG/50ML-% IV SOLN
20.0000 mg | Freq: Once | INTRAVENOUS | Status: AC
  Administered 2020-08-26: 20 mg via INTRAVENOUS

## 2020-08-26 NOTE — Patient Instructions (Signed)
[facility] Discharge Instructions for Patients Receiving Chemotherapy  Today you received the following chemotherapy agent: Gazyva  To help prevent nausea and vomiting after your treatment, we encourage you to take your nausea medication as directed. If you develop nausea and vomiting that is not controlled by your nausea medication, call the clinic.   BELOW ARE SYMPTOMS THAT SHOULD BE REPORTED IMMEDIATELY:  *FEVER GREATER THAN 100.5 F  *CHILLS WITH OR WITHOUT FEVER  NAUSEA AND VOMITING THAT IS NOT CONTROLLED WITH YOUR NAUSEA MEDICATION  *UNUSUAL SHORTNESS OF BREATH  *UNUSUAL BRUISING OR BLEEDING  TENDERNESS IN MOUTH AND THROAT WITH OR WITHOUT PRESENCE OF ULCERS  *URINARY PROBLEMS  *BOWEL PROBLEMS  UNUSUAL RASH Items with * indicate a potential emergency and should be followed up as soon as possible.  Feel free to call the clinic should you have any questions or concerns. The clinic phone number is [phone].  Please show the [place] at check-in to the Emergency Department and triage nurse.

## 2020-08-30 ENCOUNTER — Other Ambulatory Visit: Payer: 59

## 2020-08-30 ENCOUNTER — Ambulatory Visit: Payer: 59 | Admitting: Hematology

## 2020-08-30 ENCOUNTER — Encounter: Payer: Self-pay | Admitting: Hematology

## 2020-08-31 ENCOUNTER — Encounter: Payer: Self-pay | Admitting: Hematology

## 2020-09-01 ENCOUNTER — Telehealth: Payer: Self-pay | Admitting: Pharmacist

## 2020-09-01 ENCOUNTER — Other Ambulatory Visit: Payer: Self-pay | Admitting: Hematology

## 2020-09-01 ENCOUNTER — Telehealth: Payer: Self-pay

## 2020-09-01 DIAGNOSIS — C9110 Chronic lymphocytic leukemia of B-cell type not having achieved remission: Secondary | ICD-10-CM

## 2020-09-01 MED ORDER — VENETOCLAX 10 & 50 & 100 MG PO TBPK: ORAL_TABLET | ORAL | 0 refills | Status: DC

## 2020-09-01 NOTE — Telephone Encounter (Signed)
Oral Oncology Patient Advocate Encounter  Received notification from [place] that prior authorization for Venclexta is required.  PA submitted on CoverMyMeds Key [id number] Status is pending  Oral Oncology Clinic will continue to follow.   [place] Patient [place] Phone [phone] Fax [phone] 09/01/2020 12:17 PM

## 2020-09-01 NOTE — Telephone Encounter (Addendum)
Oral Oncology Pharmacist Encounter  Received new prescription for Venclexta (venetoclax) for the treatment of CLL in conjunction with obinutuzumab, planned duration 1 year.  Prescription dose and frequency assessed for appropriateness. Appropriate for therapy initiation.   Noted patient currently does not have prescription for allopurinol, which will need to be initiated with Venclexta for TLS prophylaxis. Will notify MD.  CBC w/ Diff and CMP from 08/26/20 assessed, labs stable for treatment initiation.  Current medication list in Epic reviewed, DDIs with Venclexta identified:  Category D drug-drug interaction between Venclexta and Carvedilol - carvedilol can increase serum concentrations of Venetoclax, thus patient will need 50% dose reduction of Venetoclax. MD aware and patient will be dose-escalated to max dose of 200 mg/d.  Evaluated chart and no patient barriers to medication adherence noted.   Patient required to fill Venclexta through [facility]. Rx has been redirected to their pharmacy for dispensing.   Oral Oncology Clinic will continue to follow for insurance authorization, copayment issues, initial counseling and start date.  Leron Croak, PharmD, BCPS Hematology/Oncology Clinical Pharmacist [facility] [phone] 09/01/2020 11:10 AM

## 2020-09-01 NOTE — Telephone Encounter (Signed)
Oral Oncology Patient Advocate Encounter  Prior Authorization for Colleen Shannon has been approved.    PA# [id number] Effective dates: 08/31/20 through 08/31/21  Patient must fill at [facility] will continue to follow.   [name] [name] Phone [phone] Fax [phone] 09/01/2020 12:18 PM

## 2020-09-03 NOTE — Telephone Encounter (Signed)
Oral Chemotherapy Pharmacist Encounter  I spoke with patient for overview of: Venclexta (venetoclax) for the treatment of relapsed/refractory CLL in conjunction with obinutuzumab, planned duration 1 year.  Venclexta dose will be titrated up per manufacturer recommendations.  Counseled patient on administration, dosing, side effects, monitoring, drug-food interactions, safe handling, storage, and disposal.  Patient will take Venclexta 10mg  tablets, 2 tablets (20 mg) by mouth once daily with food and water for 7 days. The 1st dose of Venclexta 20mg  will be administered outpatient based on tumor lysis syndrome (TLS) risk assessment to be low risk. Baseline labs will be assessed and the 1st dose will be administered. Per MD patient will just have baseline labs done prior to 1st dose and subsequent ramp-up doses thereafter. Hydration will only be administered if needed - patient aware to increase fluid intake to 1.5 - 2L of water per day.  Venclexta start date: 09/09/20  MD notified of need for allopurinol for TLS prophylaxis prior to Venclexta initiation - will follow-up orders and medication acquisition for patient on 09/06/20. Patient was instructed to increase fluid intake to 1.5 - 2L of water per day starting 2 days prior to [facility] initiation.  Patient will take Venclexta 50mg  tablets, 1 tablet by mouth once daily with food and water for 7 days. The 1st dose of Venclexta 50mg  will be administered outpatient based on tumor lysis syndrome (TLS) risk assessment to be low risk. Baseline labs will be assessed and the 1st dose will be administered. Per MD patient will just have baseline labs done prior to 1st dose and subsequent ramp-up doses thereafter. Hydration will only be administered if needed - patient aware to increase fluid intake to 1.5 - 2L of water per day.  Subsequent ramp-up doses of 100mg  daily x 7 days and 200mg  daily x 7 days, and then maintenance dose of 200 mg daily. Labs will be  monitored per protocol.   Patient will remain on 200 mg daily as maintenance dose due to patient being on concomitant therapy with carvedilol. Since patient is on carvedilol, maintenance dose of venetoclax will be reduced 50%.   Adverse effects include but are not limited to: TLS, decreased blood counts, electrolyte abnormalities, diarrhea, nausea, fatigue, arthralgias/myalgias, and upper respiratory tract infection.    Patient has anti-emetic on hand and knows to take it if nausea develops.   Patient will obtain anti diarrheal and alert the office of 4 or more loose stools above baseline.  Reviewed with patient importance of keeping a medication schedule and plan for any missed doses. No barriers to medication adherence identified.  Medication reconciliation performed and medication/allergy list updated.  Patient's insurance requires that Venclexta be filled through [employer] Patient has $0 copay for 1st fill of Venclexta. Patient provided [employer]'s phone number to set up shipment of medication (phone number: [phone])   All questions answered.  Ms. Limburg voiced understanding and appreciation.   Medication education handout and medication calendar will be placed at front desk of [facility] for patient to pick up on 09/09/20. Patient knows to call the office with questions or concerns. Oral Chemotherapy Clinic phone number provided to patient.   Leron Croak, PharmD, BCPS Hematology/Oncology Clinical Pharmacist [facility] [phone] 09/03/2020 3:36 PM

## 2020-09-04 ENCOUNTER — Encounter: Payer: Self-pay | Admitting: Hematology

## 2020-09-06 ENCOUNTER — Other Ambulatory Visit: Payer: Self-pay | Admitting: Hematology

## 2020-09-06 MED ORDER — DEXAMETHASONE 4 MG PO TABS: ORAL_TABLET | ORAL | 4 refills | Status: DC

## 2020-09-06 MED ORDER — ALLOPURINOL 100 MG PO TABS: 100.0000 mg | ORAL_TABLET | Freq: Every day | ORAL | 0 refills | Status: DC

## 2020-09-08 ENCOUNTER — Other Ambulatory Visit: Payer: Self-pay | Admitting: *Deleted

## 2020-09-08 DIAGNOSIS — C9110 Chronic lymphocytic leukemia of B-cell type not having achieved remission: Secondary | ICD-10-CM

## 2020-09-09 ENCOUNTER — Other Ambulatory Visit: Payer: 59

## 2020-09-09 ENCOUNTER — Other Ambulatory Visit: Payer: Self-pay

## 2020-09-09 ENCOUNTER — Inpatient Hospital Stay: Payer: 59 | Attending: Hematology and Oncology

## 2020-09-09 DIAGNOSIS — Z79899 Other long term (current) drug therapy: Secondary | ICD-10-CM | POA: Insufficient documentation

## 2020-09-09 DIAGNOSIS — R7989 Other specified abnormal findings of blood chemistry: Secondary | ICD-10-CM | POA: Insufficient documentation

## 2020-09-09 DIAGNOSIS — C9110 Chronic lymphocytic leukemia of B-cell type not having achieved remission: Secondary | ICD-10-CM | POA: Diagnosis present

## 2020-09-09 DIAGNOSIS — Z5112 Encounter for antineoplastic immunotherapy: Secondary | ICD-10-CM | POA: Insufficient documentation

## 2020-09-09 DIAGNOSIS — R509 Fever, unspecified: Secondary | ICD-10-CM | POA: Diagnosis not present

## 2020-09-09 LAB — CMP (CANCER CENTER ONLY)
ALT: 27 U/L (ref 0–44)
AST: 25 U/L (ref 15–41)
Albumin: 3.9 g/dL (ref 3.5–5.0)
Alkaline Phosphatase: 43 U/L (ref 38–126)
Anion gap: 8 (ref 5–15)
BUN: 16 mg/dL (ref 8–23)
CO2: 27 mmol/L (ref 22–32)
Calcium: 9.4 mg/dL (ref 8.9–10.3)
Chloride: 104 mmol/L (ref 98–111)
Creatinine: 0.66 mg/dL (ref 0.44–1.00)
GFR, Estimated: >60 mL/min (ref >60)
Glucose, Bld: 95 mg/dL (ref 70–99)
Potassium: 4.2 mmol/L (ref 3.5–5.1)
Sodium: 139 mmol/L (ref 135–145)
Total Bilirubin: 0.7 mg/dL (ref 0.3–1.2)
Total Protein: 6.3 g/dL — ABNORMAL LOW (ref 6.5–8.1)

## 2020-09-09 LAB — CBC WITH DIFFERENTIAL (CANCER CENTER ONLY)
Abs Immature Granulocytes: 0.01 10*3/uL (ref 0.00–0.07)
Basophils Absolute: 0.1 10*3/uL (ref 0.0–0.1)
Basophils Relative: 1 %
Eosinophils Absolute: 0.1 10*3/uL (ref 0.0–0.5)
Eosinophils Relative: 2 %
HCT: 38.9 % (ref 36.0–46.0)
Hemoglobin: 12.9 g/dL (ref 12.0–15.0)
Immature Granulocytes: 0 %
Lymphocytes Relative: 27 %
Lymphs Abs: 1.4 10*3/uL (ref 0.7–4.0)
MCH: 30.1 pg (ref 26.0–34.0)
MCHC: 33.2 g/dL (ref 30.0–36.0)
MCV: 90.7 fL (ref 80.0–100.0)
Monocytes Absolute: 0.5 10*3/uL (ref 0.1–1.0)
Monocytes Relative: 9 %
Neutro Abs: 3.2 10*3/uL (ref 1.7–7.7)
Neutrophils Relative %: 61 %
Platelet Count: 167 10*3/uL (ref 150–400)
RBC: 4.29 MIL/uL (ref 3.87–5.11)
RDW: 14.7 % (ref 11.5–15.5)
WBC Count: 5.3 10*3/uL (ref 4.0–10.5)
nRBC: 0 % (ref 0.0–0.2)

## 2020-09-09 LAB — LACTATE DEHYDROGENASE: LDH: 214 U/L — ABNORMAL HIGH (ref 98–192)

## 2020-09-16 ENCOUNTER — Other Ambulatory Visit: Payer: Self-pay | Admitting: *Deleted

## 2020-09-16 ENCOUNTER — Other Ambulatory Visit: Payer: Self-pay

## 2020-09-16 ENCOUNTER — Inpatient Hospital Stay: Payer: 59

## 2020-09-16 DIAGNOSIS — Z5112 Encounter for antineoplastic immunotherapy: Secondary | ICD-10-CM | POA: Diagnosis not present

## 2020-09-16 DIAGNOSIS — C9110 Chronic lymphocytic leukemia of B-cell type not having achieved remission: Secondary | ICD-10-CM

## 2020-09-16 LAB — CMP (CANCER CENTER ONLY)
ALT: 37 U/L (ref 0–44)
AST: 31 U/L (ref 15–41)
Albumin: 3.9 g/dL (ref 3.5–5.0)
Alkaline Phosphatase: 43 U/L (ref 38–126)
Anion gap: 7 (ref 5–15)
BUN: 17 mg/dL (ref 8–23)
CO2: 30 mmol/L (ref 22–32)
Calcium: 9.6 mg/dL (ref 8.9–10.3)
Chloride: 103 mmol/L (ref 98–111)
Creatinine: 0.71 mg/dL (ref 0.44–1.00)
GFR, Estimated: >60 mL/min (ref >60)
Glucose, Bld: 84 mg/dL (ref 70–99)
Potassium: 4.3 mmol/L (ref 3.5–5.1)
Sodium: 140 mmol/L (ref 135–145)
Total Bilirubin: 0.7 mg/dL (ref 0.3–1.2)
Total Protein: 6.2 g/dL — ABNORMAL LOW (ref 6.5–8.1)

## 2020-09-16 LAB — CBC WITH DIFFERENTIAL (CANCER CENTER ONLY)
Abs Immature Granulocytes: 0.01 10*3/uL (ref 0.00–0.07)
Basophils Absolute: 0 10*3/uL (ref 0.0–0.1)
Basophils Relative: 1 %
Eosinophils Absolute: 0.2 10*3/uL (ref 0.0–0.5)
Eosinophils Relative: 3 %
HCT: 38.6 % (ref 36.0–46.0)
Hemoglobin: 12.8 g/dL (ref 12.0–15.0)
Immature Granulocytes: 0 %
Lymphocytes Relative: 33 %
Lymphs Abs: 1.6 10*3/uL (ref 0.7–4.0)
MCH: 30.2 pg (ref 26.0–34.0)
MCHC: 33.2 g/dL (ref 30.0–36.0)
MCV: 91 fL (ref 80.0–100.0)
Monocytes Absolute: 0.5 10*3/uL (ref 0.1–1.0)
Monocytes Relative: 10 %
Neutro Abs: 2.7 10*3/uL (ref 1.7–7.7)
Neutrophils Relative %: 53 %
Platelet Count: 169 10*3/uL (ref 150–400)
RBC: 4.24 MIL/uL (ref 3.87–5.11)
RDW: 14.7 % (ref 11.5–15.5)
WBC Count: 5 10*3/uL (ref 4.0–10.5)
nRBC: 0 % (ref 0.0–0.2)

## 2020-09-16 LAB — LACTATE DEHYDROGENASE: LDH: 214 U/L — ABNORMAL HIGH (ref 98–192)

## 2020-09-22 ENCOUNTER — Other Ambulatory Visit: Payer: Self-pay | Admitting: *Deleted

## 2020-09-22 DIAGNOSIS — M79672 Pain in left foot: Secondary | ICD-10-CM | POA: Insufficient documentation

## 2020-09-22 DIAGNOSIS — C9110 Chronic lymphocytic leukemia of B-cell type not having achieved remission: Secondary | ICD-10-CM

## 2020-09-23 ENCOUNTER — Inpatient Hospital Stay: Payer: 59

## 2020-09-23 ENCOUNTER — Other Ambulatory Visit: Payer: Self-pay

## 2020-09-23 VITALS — BP 134/68 | HR 92 | Temp 98.9°F | Resp 16 | Wt 136.4 lb

## 2020-09-23 DIAGNOSIS — Z5112 Encounter for antineoplastic immunotherapy: Secondary | ICD-10-CM | POA: Diagnosis not present

## 2020-09-23 DIAGNOSIS — C9110 Chronic lymphocytic leukemia of B-cell type not having achieved remission: Secondary | ICD-10-CM

## 2020-09-23 DIAGNOSIS — Z7189 Other specified counseling: Secondary | ICD-10-CM

## 2020-09-23 LAB — CMP (CANCER CENTER ONLY)
ALT: 30 U/L (ref 0–44)
AST: 22 U/L (ref 15–41)
Albumin: 4.2 g/dL (ref 3.5–5.0)
Alkaline Phosphatase: 45 U/L (ref 38–126)
Anion gap: 9 (ref 5–15)
BUN: 17 mg/dL (ref 8–23)
CO2: 25 mmol/L (ref 22–32)
Calcium: 9.6 mg/dL (ref 8.9–10.3)
Chloride: 108 mmol/L (ref 98–111)
Creatinine: 0.7 mg/dL (ref 0.44–1.00)
GFR, Estimated: >60 mL/min (ref >60)
Glucose, Bld: 180 mg/dL — ABNORMAL HIGH (ref 70–99)
Potassium: 3.9 mmol/L (ref 3.5–5.1)
Sodium: 142 mmol/L (ref 135–145)
Total Bilirubin: 0.5 mg/dL (ref 0.3–1.2)
Total Protein: 6.6 g/dL (ref 6.5–8.1)

## 2020-09-23 LAB — CBC WITH DIFFERENTIAL (CANCER CENTER ONLY)
Abs Immature Granulocytes: 0.02 10*3/uL (ref 0.00–0.07)
Basophils Absolute: 0 10*3/uL (ref 0.0–0.1)
Basophils Relative: 0 %
Eosinophils Absolute: 0 10*3/uL (ref 0.0–0.5)
Eosinophils Relative: 0 %
HCT: 37.9 % (ref 36.0–46.0)
Hemoglobin: 13.1 g/dL (ref 12.0–15.0)
Immature Granulocytes: 0 %
Lymphocytes Relative: 11 %
Lymphs Abs: 0.8 10*3/uL (ref 0.7–4.0)
MCH: 30.1 pg (ref 26.0–34.0)
MCHC: 34.6 g/dL (ref 30.0–36.0)
MCV: 87.1 fL (ref 80.0–100.0)
Monocytes Absolute: 0.3 10*3/uL (ref 0.1–1.0)
Monocytes Relative: 5 %
Neutro Abs: 6 10*3/uL (ref 1.7–7.7)
Neutrophils Relative %: 84 %
Platelet Count: 227 10*3/uL (ref 150–400)
RBC: 4.35 MIL/uL (ref 3.87–5.11)
RDW: 14 % (ref 11.5–15.5)
WBC Count: 7.1 10*3/uL (ref 4.0–10.5)
nRBC: 0 % (ref 0.0–0.2)

## 2020-09-23 LAB — LACTATE DEHYDROGENASE: LDH: 201 U/L — ABNORMAL HIGH (ref 98–192)

## 2020-09-23 MED ORDER — DIPHENHYDRAMINE HCL 50 MG/ML IJ SOLN
INTRAMUSCULAR | Status: AC
  Filled 2020-09-23: qty 1

## 2020-09-23 MED ORDER — ACETAMINOPHEN 325 MG PO TABS
ORAL_TABLET | ORAL | Status: AC
  Filled 2020-09-23: qty 2

## 2020-09-23 MED ORDER — ACETAMINOPHEN 325 MG PO TABS
650.0000 mg | ORAL_TABLET | Freq: Once | ORAL | Status: AC
  Administered 2020-09-23: 650 mg via ORAL

## 2020-09-23 MED ORDER — METHYLPREDNISOLONE SODIUM SUCC 125 MG IJ SOLR
INTRAMUSCULAR | Status: AC
  Filled 2020-09-23: qty 2

## 2020-09-23 MED ORDER — FAMOTIDINE IN NACL 20-0.9 MG/50ML-% IV SOLN
20.0000 mg | Freq: Once | INTRAVENOUS | Status: AC
Start: 2020-09-23 — End: 2020-09-23
  Administered 2020-09-23: 20 mg via INTRAVENOUS

## 2020-09-23 MED ORDER — SODIUM CHLORIDE 0.9 % IV SOLN
1000.0000 mg | Freq: Once | INTRAVENOUS | Status: AC
  Administered 2020-09-23: 1000 mg via INTRAVENOUS
  Filled 2020-09-23: qty 40

## 2020-09-23 MED ORDER — MONTELUKAST SODIUM 10 MG PO TABS
ORAL_TABLET | ORAL | Status: AC
  Filled 2020-09-23: qty 1

## 2020-09-23 MED ORDER — SODIUM CHLORIDE 0.9 % IV SOLN
Freq: Once | INTRAVENOUS | Status: AC
  Filled 2020-09-23: qty 250

## 2020-09-23 MED ORDER — MONTELUKAST SODIUM 10 MG PO TABS
10.0000 mg | ORAL_TABLET | Freq: Once | ORAL | Status: AC
  Administered 2020-09-23: 10 mg via ORAL

## 2020-09-23 MED ORDER — METHYLPREDNISOLONE SODIUM SUCC 125 MG IJ SOLR
125.0000 mg | Freq: Once | INTRAMUSCULAR | Status: AC
  Administered 2020-09-23: 125 mg via INTRAVENOUS

## 2020-09-23 MED ORDER — DIPHENHYDRAMINE HCL 50 MG/ML IJ SOLN
50.0000 mg | Freq: Once | INTRAMUSCULAR | Status: AC
  Administered 2020-09-23: 50 mg via INTRAVENOUS

## 2020-09-23 MED ORDER — FAMOTIDINE IN NACL 20-0.9 MG/50ML-% IV SOLN
INTRAVENOUS | Status: AC
  Filled 2020-09-23: qty 50

## 2020-09-23 NOTE — Patient Instructions (Signed)
[facility] Discharge Instructions for Patients Receiving Chemotherapy  Today you received the following chemotherapy agents: Gazyva   To help prevent nausea and vomiting after your treatment, we encourage you to take your nausea medication as directed.    If you develop nausea and vomiting that is not controlled by your nausea medication, call the clinic.   BELOW ARE SYMPTOMS THAT SHOULD BE REPORTED IMMEDIATELY:  *FEVER GREATER THAN 100.5 F  *CHILLS WITH OR WITHOUT FEVER  NAUSEA AND VOMITING THAT IS NOT CONTROLLED WITH YOUR NAUSEA MEDICATION  *UNUSUAL SHORTNESS OF BREATH  *UNUSUAL BRUISING OR BLEEDING  TENDERNESS IN MOUTH AND THROAT WITH OR WITHOUT PRESENCE OF ULCERS  *URINARY PROBLEMS  *BOWEL PROBLEMS  UNUSUAL RASH Items with * indicate a potential emergency and should be followed up as soon as possible.  Feel free to call the clinic should you have any questions or concerns. The clinic phone number is [phone].  Please show the CHEMO ALERT CARD at check-in to the Emergency Department and triage nurse.   

## 2020-09-27 ENCOUNTER — Encounter: Payer: Self-pay | Admitting: Hematology

## 2020-09-27 ENCOUNTER — Other Ambulatory Visit: Payer: Self-pay | Admitting: Hematology

## 2020-09-27 MED ORDER — ALLOPURINOL 100 MG PO TABS: 100.0000 mg | ORAL_TABLET | Freq: Every day | ORAL | 0 refills | Status: DC

## 2020-09-28 ENCOUNTER — Other Ambulatory Visit: Payer: Self-pay | Admitting: Hematology

## 2020-09-28 ENCOUNTER — Other Ambulatory Visit: Payer: Self-pay | Admitting: Cardiovascular Disease

## 2020-09-29 ENCOUNTER — Other Ambulatory Visit: Payer: Self-pay

## 2020-09-29 DIAGNOSIS — D0502 Lobular carcinoma in situ of left breast: Secondary | ICD-10-CM

## 2020-09-30 ENCOUNTER — Inpatient Hospital Stay (HOSPITAL_BASED_OUTPATIENT_CLINIC_OR_DEPARTMENT_OTHER): Payer: 59 | Admitting: Hematology

## 2020-09-30 ENCOUNTER — Inpatient Hospital Stay: Payer: 59

## 2020-09-30 ENCOUNTER — Other Ambulatory Visit: Payer: Self-pay

## 2020-09-30 VITALS — BP 147/74 | HR 72 | Temp 98.4°F | Resp 18 | Ht 61.0 in | Wt 133.7 lb

## 2020-09-30 DIAGNOSIS — C9110 Chronic lymphocytic leukemia of B-cell type not having achieved remission: Secondary | ICD-10-CM

## 2020-09-30 DIAGNOSIS — Z5112 Encounter for antineoplastic immunotherapy: Secondary | ICD-10-CM | POA: Diagnosis not present

## 2020-09-30 DIAGNOSIS — D0502 Lobular carcinoma in situ of left breast: Secondary | ICD-10-CM

## 2020-09-30 LAB — CBC WITH DIFFERENTIAL (CANCER CENTER ONLY)
Abs Immature Granulocytes: 0.02 10*3/uL (ref 0.00–0.07)
Basophils Absolute: 0 10*3/uL (ref 0.0–0.1)
Basophils Relative: 0 %
Eosinophils Absolute: 0 10*3/uL (ref 0.0–0.5)
Eosinophils Relative: 1 %
HCT: 39.1 % (ref 36.0–46.0)
Hemoglobin: 13 g/dL (ref 12.0–15.0)
Immature Granulocytes: 0 %
Lymphocytes Relative: 36 %
Lymphs Abs: 1.7 10*3/uL (ref 0.7–4.0)
MCH: 29.7 pg (ref 26.0–34.0)
MCHC: 33.2 g/dL (ref 30.0–36.0)
MCV: 89.5 fL (ref 80.0–100.0)
Monocytes Absolute: 0.5 10*3/uL (ref 0.1–1.0)
Monocytes Relative: 10 %
Neutro Abs: 2.5 10*3/uL (ref 1.7–7.7)
Neutrophils Relative %: 53 %
Platelet Count: 157 10*3/uL (ref 150–400)
RBC: 4.37 MIL/uL (ref 3.87–5.11)
RDW: 14.2 % (ref 11.5–15.5)
WBC Count: 4.8 10*3/uL (ref 4.0–10.5)
nRBC: 0 % (ref 0.0–0.2)

## 2020-09-30 LAB — CMP (CANCER CENTER ONLY)
ALT: 27 U/L (ref 0–44)
AST: 18 U/L (ref 15–41)
Albumin: 3.8 g/dL (ref 3.5–5.0)
Alkaline Phosphatase: 42 U/L (ref 38–126)
Anion gap: 6 (ref 5–15)
BUN: 20 mg/dL (ref 8–23)
CO2: 30 mmol/L (ref 22–32)
Calcium: 9.3 mg/dL (ref 8.9–10.3)
Chloride: 103 mmol/L (ref 98–111)
Creatinine: 0.72 mg/dL (ref 0.44–1.00)
GFR, Estimated: >60 mL/min (ref >60)
Glucose, Bld: 86 mg/dL (ref 70–99)
Potassium: 4.3 mmol/L (ref 3.5–5.1)
Sodium: 139 mmol/L (ref 135–145)
Total Bilirubin: 0.6 mg/dL (ref 0.3–1.2)
Total Protein: 6.1 g/dL — ABNORMAL LOW (ref 6.5–8.1)

## 2020-09-30 LAB — URIC ACID: Uric Acid, Serum: 3.6 mg/dL (ref 2.5–7.1)

## 2020-09-30 LAB — LACTATE DEHYDROGENASE: LDH: 138 U/L (ref 98–192)

## 2020-09-30 MED ORDER — ALLOPURINOL 100 MG PO TABS: 100.0000 mg | ORAL_TABLET | Freq: Every day | ORAL | 0 refills | Status: DC

## 2020-09-30 NOTE — Progress Notes (Signed)
HEMATOLOGY/ONCOLOGY CLINIC NOTE  Date of Service: 09/30/2020  Patient Care Team: Carol Ada, MD as PCP - General (Family Medicine) Troy Sine, MD as PCP - Cardiology (Cardiology)  CHIEF COMPLAINTS/PURPOSE OF CONSULTATION:  CLL  HISTORY OF PRESENTING ILLNESS:  Colleen Shannon is a wonderful 64 y.o. female who has been referred to [place] by Dr. Lindi Adie for evaluation and management of CLL. The pt reports that she is doing well overall.   The pt reports that she was being seen by Dr. Lindi Adie for Lobular Carcinoma in situ (LCIS) of the left breast. She had a lumpectomy and recently completed 5 years of Tamoxifen for breast cancer prevention. Pt was having frequent respiratory infections 3-4 years ago and after some labwork was found to have CLL. Dr. Lindi Adie has been monitoring this and pt has never required treatment. She has not had as many issues with recurrent infections in the last 2-3 years. She has had two SCC lesions found and removed. Her last was found 8-10 years ago.   She has felt well over the last 6-12 months. Pt has had lymphadenopathy over the years that often swells and shrinks rapidly. About two weeks ago she began to notice bilateral neck swelling. It was uncomfortable at times and affected her breathing. Her neck swelling is starting to settle down and is no longer causing her discomfort. She received her last vaccine ([place]) in March and has not had any infections recently. Pt has joint pain for which she saw Dr. Estanislado Pandy who did not think that the pt has RA.   01/31/2016 Peripheral Blood Flow Cytometry Report revealed "Chronic Lymphocytic Leukemia."  Most recent lab results (12/01/2019) of CBC is as follows: all values are WNL except for WBC at 43.4K, PLT at 145K, Lymphs Abs at 37.2K, Mono Abs at 3.0K, Baso Abs at 0.2K, Glucose at 135, Total Protein at 6.1, ALP at 37. 12/01/2019 LDH at 285  On review of systems, pt reports joint pain, improving lumps and denies  fevers, chills, sweats, fatigue, unexpected weight loss, abdominal pain and any other symptoms.   On PMHx the pt reports Lobular Carcinoma in situ, CLL, Lumpectomy, Arthritis, Pneumonia, SCC. 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  INTERVAL HISTORY:  Colleen Shannon is a wonderful 64 y.o. female who is here for evaluation and management of CLL. The patient's last visit with [place] was on 08/26/2020. The pt reports that she is doing well overall.  The pt reports that she has been experiencing some increased joint pain. Pt denies any other symptoms. She started 200 mg Venetoclax today.   Lab results today (09/30/20) of CBC w/diff and CMP is as follows: all values are WNL except for Total Protein at 6.1. 09/30/2020 LDH at 138 09/30/2020 Uric acid is 3.6  On review of systems, pt reports joint pain and denies leg swelling, SOB, new lumps/bumps and any other symptoms.   MEDICAL HISTORY:  Past Medical History:  Diagnosis Date  . Arthritis   . Breast CA ([place]) dx'd 2015   left  . Bronchiectasis ([place])   . Cancer (HCC)    hx of skin cancer   . CLL (chronic lymphocytic leukemia) ([place]) dx'd 2015  . Dyspnea   . Dysrhythmia    PVCs  . Elevated cholesterol 05/22/2012   Nuc stress test-Normal.  . Endometrial polyp   . GERD (gastroesophageal reflux disease)   . Grade I diastolic dysfunction    noted on echo  .  History of palpitations   . Hypertension 05/22/12   ECHO-EF >55% trace tricupsid regurgitation. There is mild mitral regurgitation.   . MR (mitral regurgitation)    mild noted on echo  . Plantar fasciitis of left foot    resolved  . Pneumonia    history of x 2  . PONV (postoperative nausea and vomiting)     SURGICAL HISTORY: Past Surgical History:  Procedure Laterality Date  . ABDOMINAL SURGERY     Abdominoplasty  . BRONCHIAL BIOPSY  06/29/2020   Procedure: BRONCHIAL BIOPSIES;  Surgeon: Collene Gobble, MD;  Location: [facility] ENDOSCOPY;  Service:  Cardiopulmonary;;  . BRONCHIAL BRUSHINGS  06/29/2020   Procedure: BRONCHIAL BRUSHINGS;  Surgeon: Collene Gobble, MD;  Location: [place];  Service: Cardiopulmonary;;  . BRONCHIAL WASHINGS  06/29/2020   Procedure: BRONCHIAL WASHINGS;  Surgeon: Collene Gobble, MD;  Location: [facility] ENDOSCOPY;  Service: Cardiopulmonary;;  . CESAREAN SECTION     X 2  . CHOLECYSTECTOMY    . COLONSCOPY    . DILATION AND CURETTAGE OF UTERUS    . FOOT SURGERY Left   . HYSTEROSCOPY    . KNEE ARTHROTOMY Right 06/04/2019   Procedure: Right knee arthrotomy; scar excision;  Surgeon: [name], MD;  Location: [facility];  Service: Orthopedics;  Laterality: Right;  77mn  . KNEE CLOSED REDUCTION Right 07/20/2014   Procedure: RIGHT KNEE CLOSED MANIPULATION;  Surgeon: FGearlean Alf MD;  Location: [facility];  Service: Orthopedics;  Laterality: Right;  . KNEE SURGERY Right    arthroscopic  . PARTIAL KNEE ARTHROPLASTY Left 2017  . RADIOACTIVE SEED GUIDED EXCISIONAL BREAST BIOPSY N/A 10/06/2014   Procedure: RADIOACTIVE SEED GUIDED EXCISIONAL BREAST BIOPSY;  Surgeon: MRolm Bookbinder MD;  [place]  Service: General;  Laterality: N/A;  . REFRACTIVE SURGERY    . TOTAL KNEE ARTHROPLASTY Right 05/11/2014   Procedure: RIGHT TOTAL KNEE ARTHROPLASTY;  Surgeon: FGearlean Alf MD;  [facility];  Service: Orthopedics;  Laterality: Right;  . TUBAL LIGATION    . VIDEO BRONCHOSCOPY N/A 06/29/2020   Procedure: VIDEO BRONCHOSCOPY WITHOUT FLUORO;  Surgeon: BCollene Gobble MD;  [facility];  Service: Cardiopulmonary;  Laterality: N/A;  with BAL    SOCIAL HISTORY: Social History   Socioeconomic History  . Marital status: Married    Spouse name: Not on file  . Number of children: Not on file  . Years of education: Not on file  . Highest education level: Not on file  Occupational History  . Not on file  Tobacco Use  . Smoking status: Never Smoker  . Smokeless tobacco: Never Used  Vaping  Use  . Vaping Use: Never used  Substance and Sexual Activity  . Alcohol use: Not Currently    Comment: occasional   . Drug use: No  . Sexual activity: Yes    Birth control/protection: Surgical, Post-menopausal    Comment: Hysterectomy  Other Topics Concern  . Not on file  Social History Narrative  . Not on file   Social Determinants of Health   Financial Resource Strain: Not on file  Food Insecurity: Not on file  Transportation Needs: Not on file  Physical Activity: Not on file  Stress: Not on file  Social Connections: Not on file  Intimate Partner Violence: Not on file    FAMILY HISTORY: Family History  Problem Relation Age of Onset  . Heart disease Father   . Dementia Father   . Aneurysm Mother   .  Hyperlipidemia Mother   . Alzheimer's disease Mother   . Osteoarthritis Mother   . Hypertension Brother   . High Cholesterol Brother   . Cancer - Other Paternal Grandmother        uterine  . Hypertension Paternal Grandfather   . Stroke Paternal Grandfather   . Heart disease Maternal Grandfather   . Cancer - Other Maternal Grandfather        lung  . Cushing syndrome Brother   . Healthy Son   . Healthy Daughter     ALLERGIES:  has No Known Allergies.  MEDICATIONS:  Current Outpatient Medications  Medication Sig Dispense Refill  . albuterol (VENTOLIN HFA) 108 (90 Base) MCG/ACT inhaler Inhale 1-2 puffs into the lungs every 6 (six) hours as needed for wheezing or shortness of breath.    . allopurinol (ZYLOPRIM) 100 MG tablet Take 1 tablet (100 mg total) by mouth daily. 30 tablet 0  . amLODipine (NORVASC) 5 MG tablet Take 5 mg by mouth daily. (Patient not taking: Reported on 08/26/2020)    . atorvastatin (LIPITOR) 20 MG tablet Take 1 tablet (20 mg total) by mouth at bedtime.    . Calcium Carb-Cholecalciferol (CALCIUM-VITAMIN D3) 600-400 MG-UNIT TABS Take 1 tablet by mouth 2 (two) times daily.    . carvedilol (COREG) 12.5 MG tablet TAKE 1 TABLET BY MOUTH  TWICE A DAY  WITH A MEAL ,  CAN TAKE 1/2 TABLET AS  NEEDED FOR PALPITATIONS 180 tablet 3  . cholecalciferol (VITAMIN D3) 25 MCG (1000 UNIT) tablet Take 1,000 Units by mouth in the morning and at bedtime.    . Cyanocobalamin (B-12) 2500 MCG TABS Take 2,500 mcg by mouth daily.    Marland Kitchen dexamethasone (DECADRON) 4 MG tablet Take 2 tabs (14m) the evening before and in the morning for 3 days after each treatment with Gazyva 30 tablet 4  . ezetimibe (ZETIA) 10 MG tablet TAKE 1 TABLET BY MOUTH AT  BEDTIME 90 tablet 3  . Multiple Vitamin (MULTIVITAMIN WITH MINERALS) TABS tablet Take 1 tablet by mouth daily.    . Omega-3 Fatty Acids (FISH OIL) 1200 MG CAPS Take 1,200 mg by mouth in the morning and at bedtime. 120 mg-180 mg    . Omeprazole 20 MG TBEC Take 20 mg by mouth in the morning.     . ondansetron (ZOFRAN) 8 MG tablet Take 1 tablet (8 mg total) by mouth every 8 (eight) hours as needed for nausea. 30 tablet 3  . Turmeric 500 MG TABS Take 1,000 mg by mouth daily.     .Marland Kitchenvenetoclax (VENCLEXTA) 10 & 50 & 100 MG Starter Pack Take by mouth daily. Take 20 mg for 7 days, then 50 mg daily x 7d, then 100 mg daily x 7d, then 200 mg daily x 7d. Take with food & water. 42 tablet 0   No current facility-administered medications for this visit.    REVIEW OF SYSTEMS:   A 10+ POINT REVIEW OF SYSTEMS WAS OBTAINED including neurology, dermatology, psychiatry, cardiac, respiratory, lymph, extremities, GI, GU, Musculoskeletal, constitutional, breasts, reproductive, HEENT.  All pertinent positives are noted in the HPI.  All others are negative.   PHYSICAL EXAMINATION: ECOG PERFORMANCE STATUS: 0 - Asymptomatic VS reviewed  GENERAL:alert, in no acute distress and comfortable SKIN: no acute rashes, no significant lesions EYES: conjunctiva are pink and non-injected, sclera anicteric OROPHARYNX: MMM, no exudates, no oropharyngeal erythema or ulceration NECK: supple, no JVD LYMPH:  no palpable lymphadenopathy in the cervical, axillary or  inguinal regions LUNGS: clear to auscultation b/l with normal respiratory effort HEART: regular rate & rhythm ABDOMEN:  normoactive bowel sounds , non tender, not distended. No palpable hepatosplenomegaly.  Extremity: no pedal edema PSYCH: alert & oriented x 3 with fluent speech NEURO: no focal motor/sensory deficits  LABORATORY DATA:  I have reviewed the data as listed  . CBC Latest Ref Rng & Units 09/30/2020 09/23/2020 09/16/2020  WBC 4.0 - 10.5 K/uL 4.8 7.1 5.0  Hemoglobin 12.0 - 15.0 g/dL 13.0 13.1 12.8  Hematocrit 36.0 - 46.0 % 39.1 37.9 38.6  Platelets 150 - 400 K/uL 157 227 169    . CMP Latest Ref Rng & Units 09/30/2020 09/23/2020 09/16/2020  Glucose 70 - 99 mg/dL 86 180(H) 84  BUN 8 - 23 mg/dL _0 Creatinine 0.44 - 1.00 mg/dL 0.72 0.70 0.71  Sodium 135 - 145 mmol/L 139 142 140  Potassium 3.5 - 5.1 mmol/L 4.3 3.9 4.3  Chloride 98 - 111 mmol/L 103 108 103  CO2 22 - 32 mmol/L _1 Calcium 8.9 - 10.3 mg/dL 9.3 9.6 9.6  Total Protein 6.5 - 8.1 g/dL 6.1(L) 6.6 6.2(L)  Total Bilirubin 0.3 - 1.2 mg/dL 0.6 0.5 0.7  Alkaline Phos 38 - 126 U/L 42 45 43  AST 15 - 41 U/L _2 ALT 0 - 44 U/L 27 30 37      03/03/2020 FISH (CLL Prognosis) Panel:     RADIOGRAPHIC STUDIES: I have personally reviewed the radiological images as listed and agreed with the findings in the report. No results found.  ASSESSMENT & PLAN:   64 yo with   1) Rai Stage 1 Chronic lymphocytic Leukemia. Found to have trisomy 12 and 13q deletion via 03/03/2020 FISH (CLL Prognosis) Panel  2) Fever 3) Abnormal LFTs PLAN: -Discussed pt labwork today, 09/30/20; blood counts and chemistries are nml, LDH is WNL, Uric acid is wnl -The pt has no prohibitive toxicities from continuing 200 mg Venetoclax per day at this time.  -Discussed safety precautions when gathering socially in the midst of the pandemic.  -Will repeat scans after C6 Gazyva -Refill Allopurinol -Will see back in 3 weeks with  labs   2) Neoplasm of left breast, primary tumor staging category Tis: lobular carcinoma in situ (LCIS) Left breast LCIS status post lumpectomy 10/06/2014; Started tamoxifen for breast cancer risk reduction 20 mg daily 11/18/2014 ,decrease to 10 mg on 11/22/2017 completed February 2021.  Breast cancer surveillance: Mammogram and ultrasound 8/11/2020at [facility]: Benign breast density category B Breast exam 02/23/2020: Benign  PLAN -continue breast self examinations -MMG Q12 months - next in aug  - pending   FOLLOW UP: Labs on 10/11/2020 Plz schedule C4 of Gazyva with labs and MD visit as ordered on 10/21/2020   The total time spent in the appt was 20 minutes and more than 50% was on counseling and direct patient cares.  All of the patient's questions were answered with apparent satisfaction. The patient knows to call the clinic with any problems, questions or concerns.Sullivan Lone MD [place] AAHIVMS [facility] Hematology/Oncology Physician [facility]  (Office):       [phone] (Work cell):  [phone] (Fax):           [phone]  09/30/2020 12:14 PM  I, Yevette Edwards, am acting as a scribe for Dr. Sullivan Lone.   .I have reviewed the above documentation for accuracy and completeness, and I agree with the  above. .Brunetta Genera MD

## 2020-10-03 ENCOUNTER — Encounter: Payer: Self-pay | Admitting: Hematology

## 2020-10-04 ENCOUNTER — Other Ambulatory Visit: Payer: Self-pay | Admitting: *Deleted

## 2020-10-04 ENCOUNTER — Telehealth: Payer: Self-pay | Admitting: Hematology

## 2020-10-04 MED ORDER — VENETOCLAX 100 MG PO TABS: 200.0000 mg | ORAL_TABLET | Freq: Every day | ORAL | 0 refills | Status: DC

## 2020-10-04 NOTE — Telephone Encounter (Signed)
Scheduled appt per 12/23 sch msg - pt is aware of appt date and time   

## 2020-10-04 NOTE — Telephone Encounter (Signed)
Dr Candise Che wants Colleen Shannon to take Venclexta 200 mg daily. RX sent to pharmacy.  To take Covid test 5-7 days post exposure and hold Venclexta if she is positive for Covid.   Pt verbalized understanding

## 2020-10-08 ENCOUNTER — Emergency Department (HOSPITAL_COMMUNITY)
Admission: EM | Admit: 2020-10-08 | Discharge: 2020-10-08 | Disposition: A | Discharge status: Home / Self Care | Payer: 59 | Attending: Emergency Medicine | Admitting: Emergency Medicine

## 2020-10-08 ENCOUNTER — Emergency Department (HOSPITAL_COMMUNITY): Payer: 59

## 2020-10-08 ENCOUNTER — Encounter: Payer: Self-pay | Admitting: Hematology

## 2020-10-08 ENCOUNTER — Other Ambulatory Visit: Payer: Self-pay

## 2020-10-08 ENCOUNTER — Encounter (HOSPITAL_COMMUNITY): Payer: Self-pay

## 2020-10-08 DIAGNOSIS — R059 Cough, unspecified: Secondary | ICD-10-CM | POA: Diagnosis present

## 2020-10-08 DIAGNOSIS — Z853 Personal history of malignant neoplasm of breast: Secondary | ICD-10-CM | POA: Insufficient documentation

## 2020-10-08 DIAGNOSIS — U071 COVID-19: Secondary | ICD-10-CM

## 2020-10-08 DIAGNOSIS — I10 Essential (primary) hypertension: Secondary | ICD-10-CM | POA: Diagnosis not present

## 2020-10-08 DIAGNOSIS — Z96653 Presence of artificial knee joint, bilateral: Secondary | ICD-10-CM | POA: Diagnosis not present

## 2020-10-08 LAB — CBC WITH DIFFERENTIAL/PLATELET
Abs Immature Granulocytes: 0.01 10*3/uL (ref 0.00–0.07)
Basophils Absolute: 0 10*3/uL (ref 0.0–0.1)
Basophils Relative: 1 %
Eosinophils Absolute: 0 10*3/uL (ref 0.0–0.5)
Eosinophils Relative: 0 %
HCT: 39.7 % (ref 36.0–46.0)
Hemoglobin: 13.1 g/dL (ref 12.0–15.0)
Immature Granulocytes: 0 %
Lymphocytes Relative: 34 %
Lymphs Abs: 1 10*3/uL (ref 0.7–4.0)
MCH: 30 pg (ref 26.0–34.0)
MCHC: 33 g/dL (ref 30.0–36.0)
MCV: 91.1 fL (ref 80.0–100.0)
Monocytes Absolute: 0.4 10*3/uL (ref 0.1–1.0)
Monocytes Relative: 15 %
Neutro Abs: 1.4 10*3/uL — ABNORMAL LOW (ref 1.7–7.7)
Neutrophils Relative %: 50 %
Platelets: 128 10*3/uL — ABNORMAL LOW (ref 150–400)
RBC: 4.36 MIL/uL (ref 3.87–5.11)
RDW: 14.6 % (ref 11.5–15.5)
WBC: 2.9 10*3/uL — ABNORMAL LOW (ref 4.0–10.5)
nRBC: 0 % (ref 0.0–0.2)

## 2020-10-08 LAB — COMPREHENSIVE METABOLIC PANEL
ALT: 36 U/L (ref 0–44)
AST: 33 U/L (ref 15–41)
Albumin: 4.1 g/dL (ref 3.5–5.0)
Alkaline Phosphatase: 42 U/L (ref 38–126)
Anion gap: 10 (ref 5–15)
BUN: 16 mg/dL (ref 8–23)
CO2: 26 mmol/L (ref 22–32)
Calcium: 9 mg/dL (ref 8.9–10.3)
Chloride: 101 mmol/L (ref 98–111)
Creatinine, Ser: 0.75 mg/dL (ref 0.44–1.00)
GFR, Estimated: >60 mL/min (ref >60)
Glucose, Bld: 132 mg/dL — ABNORMAL HIGH (ref 70–99)
Potassium: 3.7 mmol/L (ref 3.5–5.1)
Sodium: 137 mmol/L (ref 135–145)
Total Bilirubin: 0.8 mg/dL (ref 0.3–1.2)
Total Protein: 6.3 g/dL — ABNORMAL LOW (ref 6.5–8.1)

## 2020-10-08 LAB — LACTIC ACID, PLASMA: Lactic Acid, Venous: 0.9 mmol/L (ref 0.5–1.9)

## 2020-10-08 NOTE — ED Provider Notes (Signed)
[facility] EMERGENCY DEPARTMENT Provider Note  CSN: [id number] Arrival date & time: 10/08/20 1456    History Chief Complaint  Patient presents with  . Fever  . Covid Positive  . Headache    HPI  Colleen Shannon is a 64 y.o. female with history of CLL currently taking oral and IV chemotherapy reports she was exposed to Covid over the Christmas holiday and began having symptoms herself 2 days ago with dry cough, headaches and fever. She has been taking motrin with good improvement. She has not had much SOB and has been eating and drinking well. She was told by the [facility] staff to come to the ED.    Past Medical History:  Diagnosis Date  . Arthritis   . Breast CA ([place]) dx'd 2015   left  . Bronchiectasis ([place])   . Cancer (HCC)    hx of skin cancer   . CLL (chronic lymphocytic leukemia) ([place]) dx'd 2015  . Dyspnea   . Dysrhythmia    PVCs  . Elevated cholesterol 05/22/2012   Nuc stress test-Normal.  . Endometrial polyp   . GERD (gastroesophageal reflux disease)   . Grade I diastolic dysfunction    noted on echo  . History of palpitations   . Hypertension 05/22/12   ECHO-EF >55% trace tricupsid regurgitation. There is mild mitral regurgitation.   . MR (mitral regurgitation)    mild noted on echo  . Plantar fasciitis of left foot    resolved  . Pneumonia    history of x 2  . PONV (postoperative nausea and vomiting)     Past Surgical History:  Procedure Laterality Date  . ABDOMINAL SURGERY     Abdominoplasty  . BRONCHIAL BIOPSY  06/29/2020   Procedure: BRONCHIAL BIOPSIES;  Surgeon: Collene Gobble, MD;  Location: [facility] ENDOSCOPY;  Service: Cardiopulmonary;;  . BRONCHIAL BRUSHINGS  06/29/2020   Procedure: BRONCHIAL BRUSHINGS;  Surgeon: Collene Gobble, MD;  Location: [place];  Service: Cardiopulmonary;;  . BRONCHIAL WASHINGS  06/29/2020   Procedure: BRONCHIAL WASHINGS;  Surgeon: Collene Gobble, MD;  Location[facility] END[facility]COPY;  Service: Cardiopulmonary;;  .  CESAREAN SECTION     X 2  . CHOLECYSTECTOMY    . COLONSCOPY    . DILATION AND CURETTAGE OF UTERUS    . FOOT SURGERY Left   . HYSTEROSCOPY    . KNEE ARTHROTOMY Right 06/04/2019   Procedure: Right knee arthrotomy; scar excision;  Surgeon: [name], MD;  [facility];  Service: Orthopedics;  Laterality: Right;  84mn  . KNEE CLOSED REDUCTION Right 07/20/2014   Procedure: RIGHT KNEE CLOSED MANIPULATION;  Surgeon: FGearlean Alf MD;  [facility];  Service: Orthopedics;  Laterality: Right;  . KNEE SURGERY Right    arthroscopic  . PARTIAL KNEE ARTHROPLASTY Left 2017  . RADIOACTIVE SEED GUIDED EXCISIONAL BREAST BIOPSY N/A 10/06/2014   Procedure: RADIOACTIVE SEED GUIDED EXCISIONAL BREAST BIOPSY;  Surgeon: MRolm Bookbinder MD;  [place]  Service: General;  Laterality: N/A;  . REFRACTIVE SURGERY    . TOTAL KNEE ARTHROPLASTY Right 05/11/2014   Procedure: RIGHT TOTAL KNEE ARTHROPLASTY;  Surgeon: FGearlean Alf MD;  [facility];  Service: Orthopedics;  Laterality: Right;  . TUBAL LIGATION    . VIDEO BRONCHOSCOPY N/A 06/29/2020   Procedure: VIDEO BRONCHOSCOPY WITHOUT FLUORO;  Surgeon: BCollene Gobble MD;  [facility];  Service: Cardiopulmonary;  Laterality: N/A;  with BAL    Family History  Problem Relation Age of Onset  . Heart disease Father   . Dementia Father   . Aneurysm Mother   . Hyperlipidemia Mother   . Alzheimer's disease Mother   . Osteoarthritis Mother   . Hypertension Brother   . High Cholesterol Brother   . Cancer - Other Paternal Grandmother        uterine  . Hypertension Paternal Grandfather   . Stroke Paternal Grandfather   . Heart disease Maternal Grandfather   . Cancer - Other Maternal Grandfather        lung  . Cushing syndrome Brother   . Healthy Son   . Healthy Daughter     Social History   Tobacco Use  . Smoking status: Never Smoker  . Smokeless tobacco: Never Used  Vaping Use  . Vaping Use:  Never used  Substance Use Topics  . Alcohol use: Yes    Comment: occasional   . Drug use: No     Home Medications Prior to Admission medications   Medication Sig Start Date End Date Taking? Authorizing Provider  albuterol (VENTOLIN HFA) 108 (90 Base) MCG/ACT inhaler Inhale 1-2 puffs into the lungs every 6 (six) hours as needed for wheezing or shortness of breath.    [provider]  allopurinol (ZYLOPRIM) 100 MG tablet Take 1 tablet (100 mg total) by mouth daily. 09/30/20   Brunetta Genera, MD  amLODipine (NORVASC) 5 MG tablet Take 5 mg by mouth daily. Patient not taking: Reported on 08/26/2020 08/04/20   [provider]  atorvastatin (LIPITOR) 20 MG tablet Take 1 tablet (20 mg total) by mouth at bedtime. 06/29/20   Collene Gobble, MD  Calcium Carb-Cholecalciferol (CALCIUM-VITAMIN D3) 600-400 MG-UNIT TABS Take 1 tablet by mouth 2 (two) times daily.    [provider]  carvedilol (COREG) 12.5 MG tablet TAKE 1 TABLET BY MOUTH  TWICE A DAY WITH A MEAL ,  CAN TAKE 1/2 TABLET AS  NEEDED FOR PALPITATIONS 09/29/20   Troy Sine, MD  cholecalciferol (VITAMIN D3) 25 MCG (1000 UNIT) tablet Take 1,000 Units by mouth in the morning and at bedtime.    [provider]  Cyanocobalamin (B-12) 2500 MCG TABS Take 2,500 mcg by mouth daily.    [provider]  dexamethasone (DECADRON) 4 MG tablet Take 2 tabs (38m) the evening before and in the morning for 3 days after each treatment with GDyann Kief11/29/21   KBrunetta Genera MD  ezetimibe (ZETIA) 10 MG tablet TAKE 1 TABLET BY MOUTH AT  BEDTIME 07/19/20   KTroy Sine MD  Multiple Vitamin (MULTIVITAMIN WITH MINERALS) TABS tablet Take 1 tablet by mouth daily.    [provider]  Omega-3 Fatty Acids (FISH OIL) 1200 MG CAPS Take 1,200 mg by mouth in the morning and at bedtime. 120 mg-180 mg    [provider]  Omeprazole 20 MG TBEC Take 20 mg by mouth in the morning.     [provider]  ondansetron (ZOFRAN) 8 MG tablet Take 1 tablet (8 mg total) by mouth every 8 (eight) hours as needed for nausea. 07/21/20   KBrunetta Genera MD  Turmeric 500 MG TABS Take 1,000 mg by mouth daily.     [provider]  venetoclax (VENCLEXTA) 100 MG tablet Take 2 tablets (200 mg total) by mouth daily. Tablets should be swallowed whole with a meal and a full glass of water. 10/04/20   KBrunetta Genera MD  Allergies    Patient has no known allergies.   Review of Systems   Review of Systems A comprehensive review of systems was completed and negative except as noted in HPI.    Physical Exam BP (!) 140/54 (BP Location: Right Arm)   Pulse 98   Temp 99.8 F (37.7 C) (Oral)   Resp 17   Ht _0  (1.549 m)   Wt 59 kg   LMP 11/19/2011 (LMP Unknown)   SpO2 100%   BMI 24.56 kg/m   Physical Exam Vitals and nursing note reviewed.  Constitutional:      Appearance: Normal appearance.  HENT:     Head: Normocephalic and atraumatic.     Nose: Nose normal.     Mouth/Throat:     Mouth: Mucous membranes are moist.  Eyes:     Extraocular Movements: Extraocular movements intact.     Conjunctiva/sclera: Conjunctivae normal.  Cardiovascular:     Rate and Rhythm: Normal rate.  Pulmonary:     Effort: Pulmonary effort is normal.     Breath sounds: Normal breath sounds.  Abdominal:     General: Abdomen is flat.     Palpations: Abdomen is soft.     Tenderness: There is no abdominal tenderness.  Musculoskeletal:        General: No swelling. Normal range of motion.     Cervical back: Neck supple.  Skin:    General: Skin is warm and dry.  Neurological:     General: No focal deficit present.     Mental Status: She is alert.  Psychiatric:        Mood and Affect: Mood normal.      ED Results / Procedures / Treatments   Labs (all labs ordered are listed, but only abnormal results are displayed) Labs Reviewed  COMPREHENSIVE METABOLIC PANEL - Abnormal;  Notable for the following components:      Result Value   Glucose, Bld 132 (*)    Total Protein 6.3 (*)    All other components within normal limits  CBC WITH DIFFERENTIAL/PLATELET - Abnormal; Notable for the following components:   WBC 2.9 (*)    Platelets 128 (*)    Neutro Abs 1.4 (*)    All other components within normal limits  LACTIC ACID, PLASMA  URINALYSIS, ROUTINE W REFLEX MICROSCOPIC    EKG None  Radiology DG Chest 2 View  Result Date: 10/08/2020 CLINICAL DATA:  Cough. COVID positive. Chemotherapy for breast cancer. EXAM: CHEST - 2 VIEW COMPARISON:  07/23/2020 FINDINGS: The heart size and mediastinal contours are within normal limits. Both lungs are clear. The visualized skeletal structures are unremarkable. IMPRESSION: No active cardiopulmonary disease. Electronically Signed   By: Lucienne Capers M.D.   On: 10/08/2020 16:12    Procedures Procedures  Medications Ordered in the ED Medications - No data to display   MDM Rules/Calculators/A&P MDM Patient known to be Covid positive with mild symptoms and no hypoxia. She will be referred for MAB infusion but she understands that current supplied are very limited and it may be several days before she can be treated. She was advised to monitor her SpO2 at home and to come back to ED if her oxygen is below 90% and will not recover at home or for any other acute worsening.  ED Course  I have reviewed the triage vital signs and the nursing notes.  Pertinent labs & imaging results that were available during my care of the patient were reviewed by  me and considered in my medical decision making (see chart for details).     Final Clinical Impression(s) / ED Diagnoses Final diagnoses:  [id number]    Rx / DC Orders ED Discharge Orders    None       Truddie Hidden, MD 10/08/20 1945

## 2020-10-08 NOTE — ED Triage Notes (Signed)
Patient states she is Covid +. Patient is currently taking oral chemo and infusions once a month. Patient called the [facility] and was told to come to the Ed for antibiotics.  Patient c/o fever, headache, and a productive cough, unsure of the color of sputum.

## 2020-10-10 ENCOUNTER — Encounter: Payer: Self-pay | Admitting: Hematology

## 2020-10-11 ENCOUNTER — Telehealth: Payer: Self-pay | Admitting: Hematology

## 2020-10-11 ENCOUNTER — Other Ambulatory Visit: Payer: Self-pay | Admitting: Physician Assistant

## 2020-10-11 ENCOUNTER — Encounter: Payer: Self-pay | Admitting: Hematology

## 2020-10-11 ENCOUNTER — Telehealth: Payer: Self-pay | Admitting: *Deleted

## 2020-10-11 ENCOUNTER — Ambulatory Visit (HOSPITAL_COMMUNITY)
Admission: RE | Admit: 2020-10-11 | Discharge: 2020-10-11 | Disposition: A | Discharge status: Home / Self Care | Payer: 59 | Source: Ambulatory Visit | Attending: Pulmonary Disease | Admitting: Pulmonary Disease

## 2020-10-11 ENCOUNTER — Inpatient Hospital Stay: Payer: 59

## 2020-10-11 DIAGNOSIS — D0502 Lobular carcinoma in situ of left breast: Secondary | ICD-10-CM | POA: Insufficient documentation

## 2020-10-11 DIAGNOSIS — U071 COVID-19: Secondary | ICD-10-CM

## 2020-10-11 DIAGNOSIS — I10 Essential (primary) hypertension: Secondary | ICD-10-CM

## 2020-10-11 DIAGNOSIS — Z79899 Other long term (current) drug therapy: Secondary | ICD-10-CM

## 2020-10-11 DIAGNOSIS — C9110 Chronic lymphocytic leukemia of B-cell type not having achieved remission: Secondary | ICD-10-CM | POA: Insufficient documentation

## 2020-10-11 MED ORDER — EZETIMIBE 10 MG PO TABS: 10.0000 mg | ORAL_TABLET | Freq: Every day | ORAL | 0 refills | Status: DC

## 2020-10-11 MED ORDER — DIPHENHYDRAMINE HCL 50 MG/ML IJ SOLN: 50.0000 mg | Freq: Once | INTRAMUSCULAR | Status: DC | PRN

## 2020-10-11 MED ORDER — METHYLPREDNISOLONE SODIUM SUCC 125 MG IJ SOLR: 125.0000 mg | Freq: Once | INTRAMUSCULAR | Status: DC | PRN

## 2020-10-11 MED ORDER — FAMOTIDINE IN NACL 20-0.9 MG/50ML-% IV SOLN: 20.0000 mg | Freq: Once | INTRAVENOUS | Status: DC | PRN

## 2020-10-11 MED ORDER — ALBUTEROL SULFATE HFA 108 (90 BASE) MCG/ACT IN AERS: 2.0000 | INHALATION_SPRAY | Freq: Once | RESPIRATORY_TRACT | Status: DC | PRN

## 2020-10-11 MED ORDER — EPINEPHRINE 0.3 MG/0.3ML IJ SOAJ: 0.3000 mg | Freq: Once | INTRAMUSCULAR | Status: DC | PRN

## 2020-10-11 MED ORDER — SODIUM CHLORIDE 0.9 % IV SOLN: INTRAVENOUS | Status: DC | PRN

## 2020-10-11 MED ORDER — SOTROVIMAB 500 MG/8ML IV SOLN
500.0000 mg | Freq: Once | INTRAVENOUS | Status: AC
  Administered 2020-10-11: 500 mg via INTRAVENOUS

## 2020-10-11 NOTE — Progress Notes (Signed)
  Diagnosis: COVID-19  Physician: Dr. Delford Field   Procedure: Sotrovimab treatment   Complications: No immediate complications noted.  Discharge: Discharged home   Colleen Shannon 10/11/2020

## 2020-10-11 NOTE — Discharge Instructions (Signed)
10 Things You Can Do to Manage Your COVID-19 Symptoms at Home If you have possible or confirmed COVID-19: 1. Stay home from work and school. And stay away from other public places. If you must go out, avoid using any kind of public transportation, ridesharing, or taxis. 2. Monitor your symptoms carefully. If your symptoms get worse, call your healthcare provider immediately. 3. Get rest and stay hydrated. 4. If you have a medical appointment, call the healthcare provider ahead of time and tell them that you have or may have COVID-19. 5. For medical emergencies, call 911 and notify the dispatch personnel that you have or may have COVID-19. 6. Cover your cough and sneezes with a tissue or use the inside of your elbow. 7. Wash your hands often with soap and water for at least 20 seconds or clean your hands with an alcohol-based hand sanitizer that contains at least 60% alcohol. 8. As much as possible, stay in a specific room and away from other people in your home. Also, you should use a separate bathroom, if available. If you need to be around other people in or outside of the home, wear a mask. 9. Avoid sharing personal items with other people in your household, like dishes, towels, and bedding. 10. Clean all surfaces that are touched often, like counters, tabletops, and doorknobs. Use household cleaning sprays or wipes according to the label instructions. cdc.gov/coronavirus 04/09/2019 This information is not intended to replace advice given to you by your health care provider. Make sure you discuss any questions you have with your health care provider. Document Revised: 09/11/2019 Document Reviewed: 09/11/2019 Elsevier Patient Education  2020 Elsevier Inc. What types of side effects do monoclonal antibody drugs cause?  Common side effects  In general, the more common side effects caused by monoclonal antibody drugs include: . Allergic reactions, such as hives or itching . Flu-like signs and  symptoms, including chills, fatigue, fever, and muscle aches and pains . Nausea, vomiting . Diarrhea . Skin rashes . Low blood pressure   The CDC is recommending patients who receive monoclonal antibody treatments wait at least 90 days before being vaccinated.  Currently, there are no data on the safety and efficacy of mRNA COVID-19 vaccines in persons who received monoclonal antibodies or convalescent plasma as part of COVID-19 treatment. Based on the estimated half-life of such therapies as well as evidence suggesting that reinfection is uncommon in the 90 days after initial infection, vaccination should be deferred for at least 90 days, as a precautionary measure until additional information becomes available, to avoid interference of the antibody treatment with vaccine-induced immune responses. If you have any questions or concerns after the infusion please call the Advanced Practice Provider on call at [phone]. This number is ONLY intended for your use regarding questions or concerns about the infusion post-treatment side-effects.  Please do not provide this number to others for use. For return to work notes please contact your primary care provider.   If someone you know is interested in receiving treatment please have them call the COVID hotline at [phone].   

## 2020-10-11 NOTE — Progress Notes (Signed)
I connected by phone with Colleen Shannon on 10/11/2020 at 8:10 AM to discuss the potential use of a new treatment for mild to moderate COVID-19 viral infection in non-hospitalized patients.  This patient is a 65 y.o. female that meets the FDA criteria for Emergency Use Authorization of COVID monoclonal antibody sotrovimab, casirivimab/imdevimab or bamlamivimab/estevimab.  Has a (+) direct SARS-CoV-2 viral test result  Has mild or moderate COVID-19   Is NOT hospitalized due to COVID-19  Is within 10 days of symptom onset  Has at least one of the high risk factor(s) for progression to severe COVID-19 and/or hospitalization as defined in EUA.  Specific high risk criteria : Immunosuppressive Disease or Treatment and Cardiovascular disease or hypertension   I have spoken and communicated the following to the patient or parent/caregiver regarding COVID monoclonal antibody treatment:  1. FDA has authorized the emergency use for the treatment of mild to moderate COVID-19 in adults and pediatric patients with positive results of direct SARS-CoV-2 viral testing who are 3 years of age and older weighing at least 40 kg, and who are at high risk for progressing to severe COVID-19 and/or hospitalization.  2. The significant known and potential risks and benefits of COVID monoclonal antibody, and the extent to which such potential risks and benefits are unknown.  3. Information on available alternative treatments and the risks and benefits of those alternatives, including clinical trials.  4. Patients treated with COVID monoclonal antibody should continue to self-isolate and use infection control measures (e.g., wear mask, isolate, social distance, avoid sharing personal items, clean and disinfect "high touch" surfaces, and frequent handwashing) according to CDC guidelines.   5. The patient or parent/caregiver has the option to accept or refuse COVID monoclonal antibody treatment.  After reviewing this  information with the patient, the patient has agreed to receive one of the available covid 19 monoclonal antibodies and will be provided an appropriate fact sheet prior to infusion.  Sx onset 12/28. Set up for infusion on 1/3 @ 1:30pm. Directions given to [facility]. Pt is aware that insurance will be charged an infusion fee. Pt is fully vaccinated but has CLL.   [name] 10/11/2020 8:10 AM

## 2020-10-11 NOTE — Telephone Encounter (Signed)
Contacted patient with Dr. Clyda Greener response to MyChart message regarding positive Covid test.  Dr. Clyda Greener response: Her CLL is stable so would like to be completely certain her covid is completely resolved prior to restarting treatments.Cancel today's lab appt.  Hold Venetoclax , labs and all cancer center appointment for 3 weeks from +ve covid test.   Schedule labs, next [facility] and clinic f/u with Dr. Candise Che in 3weeks.   COVID antibody rx per ED recommendations Patient verbalized understanding of all information. She is scheduled for antibody infusion later today.  Schedule message sent with Dr. Clyda Greener request for appts.

## 2020-10-11 NOTE — Progress Notes (Signed)
Patient reviewed Fact Sheet for Patients, Parents, and Caregivers for Emergency Use Authorization (EUA) of Sotrovimab for the Treatment of Coronavirus. Patient also reviewed and is agreeable to the estimated cost of treatment. Patient is agreeable to proceed.   

## 2020-10-11 NOTE — Telephone Encounter (Signed)
Rescheduled appointment per 1/3 schedule message. Patient is aware of changes. 

## 2020-10-14 ENCOUNTER — Other Ambulatory Visit: Payer: Self-pay | Admitting: Cardiovascular Disease

## 2020-10-14 DIAGNOSIS — I10 Essential (primary) hypertension: Secondary | ICD-10-CM

## 2020-10-14 DIAGNOSIS — Z79899 Other long term (current) drug therapy: Secondary | ICD-10-CM

## 2020-10-14 MED ORDER — EZETIMIBE 10 MG PO TABS: 10.0000 mg | ORAL_TABLET | Freq: Every day | ORAL | 0 refills | Status: DC

## 2020-10-22 ENCOUNTER — Other Ambulatory Visit: Payer: Self-pay | Admitting: Hematology

## 2020-10-22 ENCOUNTER — Encounter: Payer: Self-pay | Admitting: Hematology

## 2020-10-22 ENCOUNTER — Other Ambulatory Visit: Payer: Self-pay | Admitting: *Deleted

## 2020-10-22 ENCOUNTER — Other Ambulatory Visit: Payer: 59

## 2020-10-22 ENCOUNTER — Ambulatory Visit: Payer: 59

## 2020-10-22 DIAGNOSIS — C9110 Chronic lymphocytic leukemia of B-cell type not having achieved remission: Secondary | ICD-10-CM

## 2020-10-22 MED ORDER — ALLOPURINOL 100 MG PO TABS: 100.0000 mg | ORAL_TABLET | Freq: Every day | ORAL | 0 refills | Status: DC

## 2020-10-27 ENCOUNTER — Other Ambulatory Visit: Payer: Self-pay | Admitting: Hematology

## 2020-10-27 NOTE — Progress Notes (Signed)
HEMATOLOGY/ONCOLOGY CLINIC NOTE  Date of Service: 10/27/2020  Patient Care Team: Carol Ada, MD as PCP - General (Family Medicine) Troy Sine, MD as PCP - Cardiology (Cardiology)  CHIEF COMPLAINTS/PURPOSE OF CONSULTATION:  CLL  HISTORY OF PRESENTING ILLNESS:  Colleen Shannon is a wonderful 65 y.o. female who has been referred to [place] by Dr. Lindi Adie for evaluation and management of CLL. The pt reports that she is doing well overall.   The pt reports that she was being seen by Dr. Lindi Adie for Lobular Carcinoma in situ (LCIS) of the left breast. She had a lumpectomy and recently completed 5 years of Tamoxifen for breast cancer prevention. Pt was having frequent respiratory infections 3-4 years ago and after some labwork was found to have CLL. Dr. Lindi Adie has been monitoring this and pt has never required treatment. She has not had as many issues with recurrent infections in the last 2-3 years. She has had two SCC lesions found and removed. Her last was found 8-10 years ago.   She has felt well over the last 6-12 months. Pt has had lymphadenopathy over the years that often swells and shrinks rapidly. About two weeks ago she began to notice bilateral neck swelling. It was uncomfortable at times and affected her breathing. Her neck swelling is starting to settle down and is no longer causing her discomfort. She received her last vaccine ([place]) in March and has not had any infections recently. Pt has joint pain for which she saw Dr. Estanislado Pandy who did not think that the pt has RA.   01/31/2016 Peripheral Blood Flow Cytometry Report revealed "Chronic Lymphocytic Leukemia."  Most recent lab results (12/01/2019) of CBC is as follows: all values are WNL except for WBC at 43.4K, PLT at 145K, Lymphs Abs at 37.2K, Mono Abs at 3.0K, Baso Abs at 0.2K, Glucose at 135, Total Protein at 6.1, ALP at 37. 12/01/2019 LDH at 285  On review of systems, pt reports joint pain, improving lumps and denies  fevers, chills, sweats, fatigue, unexpected weight loss, abdominal pain and any other symptoms.   On PMHx the pt reports Lobular Carcinoma in situ, CLL, Lumpectomy, Arthritis, Pneumonia, SCC. 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  INTERVAL HISTORY:  Colleen Shannon is a wonderful 65 y.o. female who is here for evaluation and management of CLL. The patient's last visit with [place] was on 09/30/2020.The pt reports that she is doing well overall.  The pt reports that she has improved since her experience with COVID at the beginning of the new year. All symptoms have improved following her antibody infusions on day 6 of COVID. She tolerated this very well.  The pt noted that her insurance no longer allows her to go to [facility]. She now uses [facility] for 30 day prescriptions and [facility] for 90 day+ prescriptions.  Lab results today 10/28/2020 of CBC w/diff and CMP is as follows: all values are WNL except for Glucose at 137.  10/28/2020 Uric Acid at at 3.8.  On review of systems, pt denies SOB, n/v/d, back pain, abdominal pain, decreased appetite, leg swelling, fatigue and any other symptoms.   MEDICAL HISTORY:  Past Medical History:  Diagnosis Date  . Arthritis   . Breast CA ([place]) dx'd 2015   left  . Bronchiectasis ([place])   . Cancer (HCC)    hx of skin cancer   . CLL (chronic lymphocytic leukemia) ([place]) dx'd 2015  . Dyspnea   .  Dysrhythmia    PVCs  . Elevated cholesterol 05/22/2012   Nuc stress test-Normal.  . Endometrial polyp   . GERD (gastroesophageal reflux disease)   . Grade I diastolic dysfunction    noted on echo  . History of palpitations   . Hypertension 05/22/12   ECHO-EF >55% trace tricupsid regurgitation. There is mild mitral regurgitation.   . MR (mitral regurgitation)    mild noted on echo  . Plantar fasciitis of left foot    resolved  . Pneumonia    history of x 2  . PONV (postoperative nausea and vomiting)     SURGICAL HISTORY: Past  Surgical History:  Procedure Laterality Date  . ABDOMINAL SURGERY     Abdominoplasty  . BRONCHIAL BIOPSY  06/29/2020   Procedure: BRONCHIAL BIOPSIES;  Surgeon: Collene Gobble, MD;  Location: [facility] ENDOSCOPY;  Service: Cardiopulmonary;;  . BRONCHIAL BRUSHINGS  06/29/2020   Procedure: BRONCHIAL BRUSHINGS;  Surgeon: Collene Gobble, MD;  Location: [place];  Service: Cardiopulmonary;;  . BRONCHIAL WASHINGS  06/29/2020   Procedure: BRONCHIAL WASHINGS;  Surgeon: Collene Gobble, MD;  Location[facility] ENDOSCOPY;  Service: Cardiopulmonary;;  . CESAREAN SECTION     X 2  . CHOLECYSTECTOMY    . COLONSCOPY    . DILATION AND CURETTAGE OF UTERUS    . FOOT SURGERY Left   . HYSTEROSCOPY    . KNEE ARTHROTOMY Right 06/04/2019   Procedure: Right knee arthrotomy; scar excision;  Surgeon: [name], MD;  Location: [facility] ORS;  Service: Orthopedics;  Laterality: Right;  64mn  . KNEE CLOSED REDUCTION Right 07/20/2014   Procedure: RIGHT KNEE CLOSED MANIPULATION;  Surgeon: FGearlean Alf MD;  Location: [facility] ORS;  Service: Orthopedics;  Laterality: Right;  . KNEE SURGERY Right    arthroscopic  . PARTIAL KNEE ARTHROPLASTY Left 2017  . RADIOACTIVE SEED GUIDED EXCISIONAL BREAST BIOPSY N/A 10/06/2014   Procedure: RADIOACTIVE SEED GUIDED EXCISIONAL BREAST BIOPSY;  Surgeon: MRolm Bookbinder MD;  [place]  Service: General;  Laterality: N/A;  . REFRACTIVE SURGERY    . TOTAL KNEE ARTHROPLASTY Right 05/11/2014   Procedure: RIGHT TOTAL KNEE ARTHROPLASTY;  Surgeon: FGearlean Alf MD;  Location: [facility] OR[facility];  Service: Orthopedics;  Laterality: Right;  . TUBAL LIGATION    . VIDEO BRONCHOSCOPY N/A 06/29/2020   Procedure: VIDEO BRONCHOSCOPY WITHOUT FLUORO;  Surgeon: BCollene Gobble MD;  [facility];  Service: Cardiopulmonary;  Laterality: N/A;  with BAL    SOCIAL HISTORY: Social History   Socioeconomic History  . Marital status: Married    Spouse name: Not on file  . Number of  children: Not on file  . Years of education: Not on file  . Highest education level: Not on file  Occupational History  . Not on file  Tobacco Use  . Smoking status: Never Smoker  . Smokeless tobacco: Never Used  Vaping Use  . Vaping Use: Never used  Substance and Sexual Activity  . Alcohol use: Yes    Comment: occasional   . Drug use: No  . Sexual activity: Yes    Birth control/protection: Surgical, Post-menopausal    Comment: Hysterectomy  Other Topics Concern  . Not on file  Social History Narrative  . Not on file   Social Determinants of Health   Financial Resource Strain: Not on file  Food Insecurity: Not on file  Transportation Needs: Not on file  Physical Activity: Not on file  Stress: Not on file  Social  Connections: Not on file  Intimate Partner Violence: Not on file    FAMILY HISTORY: Family History  Problem Relation Age of Onset  . Heart disease Father   . Dementia Father   . Aneurysm Mother   . Hyperlipidemia Mother   . Alzheimer's disease Mother   . Osteoarthritis Mother   . Hypertension Brother   . High Cholesterol Brother   . Cancer - Other Paternal Grandmother        uterine  . Hypertension Paternal Grandfather   . Stroke Paternal Grandfather   . Heart disease Maternal Grandfather   . Cancer - Other Maternal Grandfather        lung  . Cushing syndrome Brother   . Healthy Son   . Healthy Daughter     ALLERGIES:  has No Known Allergies.  MEDICATIONS:  Current Outpatient Medications  Medication Sig Dispense Refill  . albuterol (VENTOLIN HFA) 108 (90 Base) MCG/ACT inhaler Inhale 1-2 puffs into the lungs every 6 (six) hours as needed for wheezing or shortness of breath.    . allopurinol (ZYLOPRIM) 100 MG tablet Take 1 tablet (100 mg total) by mouth daily. 30 tablet 0  . amLODipine (NORVASC) 5 MG tablet TAKE 1 TABLET BY MOUTH AT  BEDTIME 90 tablet 3  . atorvastatin (LIPITOR) 20 MG tablet Take 1 tablet (20 mg total) by mouth at bedtime.    .  Calcium Carb-Cholecalciferol (CALCIUM-VITAMIN D3) 600-400 MG-UNIT TABS Take 1 tablet by mouth 2 (two) times daily.    . carvedilol (COREG) 12.5 MG tablet TAKE 1 TABLET BY MOUTH  TWICE A DAY WITH A MEAL ,  CAN TAKE 1/2 TABLET AS  NEEDED FOR PALPITATIONS 180 tablet 3  . cholecalciferol (VITAMIN D3) 25 MCG (1000 UNIT) tablet Take 1,000 Units by mouth in the morning and at bedtime.    . Cyanocobalamin (B-12) 2500 MCG TABS Take 2,500 mcg by mouth daily.    Marland Kitchen dexamethasone (DECADRON) 4 MG tablet Take 2 tabs (50m) the evening before and in the morning for 3 days after each treatment with Gazyva 30 tablet 4  . ezetimibe (ZETIA) 10 MG tablet Take 1 tablet (10 mg total) by mouth at bedtime. 30 tablet 0  . Multiple Vitamin (MULTIVITAMIN WITH MINERALS) TABS tablet Take 1 tablet by mouth daily.    . Omega-3 Fatty Acids (FISH OIL) 1200 MG CAPS Take 1,200 mg by mouth in the morning and at bedtime. 120 mg-180 mg    . Omeprazole 20 MG TBEC Take 20 mg by mouth in the morning.     . ondansetron (ZOFRAN) 8 MG tablet Take 1 tablet (8 mg total) by mouth every 8 (eight) hours as needed for nausea. 30 tablet 3  . Turmeric 500 MG TABS Take 1,000 mg by mouth daily.     . VENCLEXTA 100 MG tablet TAKE 2 TABLETS BY MOUTH  ONCE DAILY WITH FOOD AND A  FULL GLASS OF WATER 60 tablet 0   No current facility-administered medications for this visit.    REVIEW OF SYSTEMS:   10 Point review of Systems was done is negative except as noted above.  PHYSICAL EXAMINATION: ECOG PERFORMANCE STATUS: 0 - Asymptomatic .BP (!) 144/73 (BP Location: Left Arm, Patient Position: Sitting)   Pulse 99   Temp 98.2 F (36.8 C) (Tympanic)   Resp 18   Ht _0  (1.549 m)   Wt 137 lb 12.8 oz (62.5 kg)   LMP 11/19/2011 (LMP Unknown)   SpO2 100%   BMI  26.04 kg/m   GENERAL:alert, in no acute distress and comfortable SKIN: no acute rashes, no significant lesions EYES: conjunctiva are pink and non-injected, sclera anicteric OROPHARYNX: MMM, no  exudates, no oropharyngeal erythema or ulceration NECK: supple, no JVD. Minimal b/l lymph node, barely palpable. LYMPH:  no palpable lymphadenopathy in the cervical, axillary or inguinal regions LUNGS: clear to auscultation b/l with normal respiratory effort HEART: regular rate & rhythm ABDOMEN:  normoactive bowel sounds , non tender, not distended. Extremity: no pedal edema PSYCH: alert & oriented x 3 with fluent speech NEURO: no focal motor/sensory deficits    LABORATORY DATA:  I have reviewed the data as listed  . CBC Latest Ref Rng & Units 10/28/2020 10/08/2020 09/30/2020  WBC 4.0 - 10.5 K/uL 5.1 2.9(L) 4.8  Hemoglobin 12.0 - 15.0 g/dL 13.9 13.1 13.0  Hematocrit 36.0 - 46.0 % 41.2 39.7 39.1  Platelets 150 - 400 K/uL 160 128(L) 157    . CMP Latest Ref Rng & Units 10/28/2020 10/08/2020 09/30/2020  Glucose 70 - 99 mg/dL 137(H) 132(H) 86  BUN 8 - 23 mg/dL _0 Creatinine 0.44 - 1.00 mg/dL 0.64 0.75 0.72  Sodium 135 - 145 mmol/L 140 137 139  Potassium 3.5 - 5.1 mmol/L 4.1 3.7 4.3  Chloride 98 - 111 mmol/L 107 101 103  CO2 22 - 32 mmol/L _1 Calcium 8.9 - 10.3 mg/dL 9.2 9.0 9.3  Total Protein 6.5 - 8.1 g/dL 6.6 6.3(L) 6.1(L)  Total Bilirubin 0.3 - 1.2 mg/dL 0.5 0.8 0.6  Alkaline Phos 38 - 126 U/L 48 42 42  AST 15 - 41 U/L 16 33 18  ALT 0 - 44 U/L 19 36 27      03/03/2020 FISH (CLL Prognosis) Panel:     RADIOGRAPHIC STUDIES: I have personally reviewed the radiological images as listed and agreed with the findings in the report. DG Chest 2 View  Result Date: 10/08/2020 CLINICAL DATA:  Cough. COVID positive. Chemotherapy for breast cancer. EXAM: CHEST - 2 VIEW COMPARISON:  07/23/2020 FINDINGS: The heart size and mediastinal contours are within normal limits. Both lungs are clear. The visualized skeletal structures are unremarkable. IMPRESSION: No active cardiopulmonary disease. Electronically Signed   By: Lucienne Capers M.D.   On: 10/08/2020 16:12     ASSESSMENT & PLAN:   65 yo with   1) Rai Stage 1 Chronic lymphocytic Leukemia. Found to have trisomy 12 and 13q deletion via 03/03/2020 FISH (CLL Prognosis) Panel  2) Fever 3) Abnormal LFTs 4) recent COVID 19 infection -- resolved. PLAN: -Discussed pt labwork today, 10/28/2020; WBC back to nml, chemistries and uric acid are all normal. -Advised pt to continue back on Venetoclax ASAP, but remain cautious. She has not taking this since October 08, 2020. Will remain at 264m instead of increasing to 3030m -Advise pt she is currently on C4D1 Gazyva, with two more cycles remaining. -The pt has no prohibitive toxicities from continuing 200 mg Venetoclax per day at this time.  -Discontinue Allopurinol due to normal WBC and minimal risk of tumor lysis. -Recommend pt continue to drink 48-64 oz water daily.  -Will get blood tests in 2 weeks. -Will see back in 4 weeks with labs.  2) Neoplasm of left breast, primary tumor staging category Tis: lobular carcinoma in situ (LCIS) Left breast LCIS status post lumpectomy 10/06/2014; Started tamoxifen for breast cancer risk reduction 20 mg daily 11/18/2014 ,decrease to 10 mg on 11/22/2017 completed February 2021.  Breast cancer surveillance:  Mammogram and ultrasound 8/11/2020at [facility]: Benign breast density category B Breast exam 02/23/2020: Benign  PLAN -continue breast self examinations -MMG Q12 months    FOLLOW UP: Labs in 2 weeks Plz schedule Next 2 cycles of Gazyva with labs and MD visit (every 4 weeks)     The total time spent in the appointment was 30 minutes and more than 50% was on counseling and direct patient cares, ordering and management of immunotherapy  All of the patient's questions were answered with apparent satisfaction. The patient knows to call the clinic with any problems, questions or concerns.    Sullivan Lone MD [place] AAHIVMS [facility] Hematology/Oncology Physician [facility]  (Office):        [phone] (Work cell):  [phone] (Fax):           [phone]  10/27/2020 3:03 PM  I, Reinaldo Raddle, am acting as scribe for Dr. Sullivan Lone, MD.    .I have reviewed the above documentation for accuracy and completeness, and I agree with the above. Brunetta Genera MD

## 2020-10-28 ENCOUNTER — Inpatient Hospital Stay (HOSPITAL_BASED_OUTPATIENT_CLINIC_OR_DEPARTMENT_OTHER): Payer: 59 | Admitting: Hematology

## 2020-10-28 ENCOUNTER — Encounter: Payer: Self-pay | Admitting: Hematology

## 2020-10-28 ENCOUNTER — Inpatient Hospital Stay: Payer: 59

## 2020-10-28 ENCOUNTER — Other Ambulatory Visit: Payer: Self-pay

## 2020-10-28 ENCOUNTER — Inpatient Hospital Stay: Payer: 59 | Attending: Hematology and Oncology

## 2020-10-28 VITALS — BP 144/73 | HR 99 | Temp 98.2°F | Resp 18 | Ht 61.0 in | Wt 137.8 lb

## 2020-10-28 VITALS — BP 112/76 | HR 89 | Temp 98.6°F | Resp 17

## 2020-10-28 DIAGNOSIS — Z5112 Encounter for antineoplastic immunotherapy: Secondary | ICD-10-CM | POA: Insufficient documentation

## 2020-10-28 DIAGNOSIS — Z23 Encounter for immunization: Secondary | ICD-10-CM

## 2020-10-28 DIAGNOSIS — C9110 Chronic lymphocytic leukemia of B-cell type not having achieved remission: Secondary | ICD-10-CM

## 2020-10-28 DIAGNOSIS — Z7189 Other specified counseling: Secondary | ICD-10-CM

## 2020-10-28 LAB — CBC WITH DIFFERENTIAL/PLATELET
Abs Immature Granulocytes: 0.02 10*3/uL (ref 0.00–0.07)
Basophils Absolute: 0 10*3/uL (ref 0.0–0.1)
Basophils Relative: 0 %
Eosinophils Absolute: 0 10*3/uL (ref 0.0–0.5)
Eosinophils Relative: 0 %
HCT: 41.2 % (ref 36.0–46.0)
Hemoglobin: 13.9 g/dL (ref 12.0–15.0)
Immature Granulocytes: 0 %
Lymphocytes Relative: 15 %
Lymphs Abs: 0.8 10*3/uL (ref 0.7–4.0)
MCH: 29.4 pg (ref 26.0–34.0)
MCHC: 33.7 g/dL (ref 30.0–36.0)
MCV: 87.3 fL (ref 80.0–100.0)
Monocytes Absolute: 0.4 10*3/uL (ref 0.1–1.0)
Monocytes Relative: 8 %
Neutro Abs: 3.8 10*3/uL (ref 1.7–7.7)
Neutrophils Relative %: 77 %
Platelets: 160 10*3/uL (ref 150–400)
RBC: 4.72 MIL/uL (ref 3.87–5.11)
RDW: 13.5 % (ref 11.5–15.5)
WBC: 5.1 10*3/uL (ref 4.0–10.5)
nRBC: 0 % (ref 0.0–0.2)

## 2020-10-28 LAB — CMP (CANCER CENTER ONLY)
ALT: 19 U/L (ref 0–44)
AST: 16 U/L (ref 15–41)
Albumin: 4.1 g/dL (ref 3.5–5.0)
Alkaline Phosphatase: 48 U/L (ref 38–126)
Anion gap: 10 (ref 5–15)
BUN: 15 mg/dL (ref 8–23)
CO2: 23 mmol/L (ref 22–32)
Calcium: 9.2 mg/dL (ref 8.9–10.3)
Chloride: 107 mmol/L (ref 98–111)
Creatinine: 0.64 mg/dL (ref 0.44–1.00)
GFR, Estimated: >60 mL/min (ref >60)
Glucose, Bld: 137 mg/dL — ABNORMAL HIGH (ref 70–99)
Potassium: 4.1 mmol/L (ref 3.5–5.1)
Sodium: 140 mmol/L (ref 135–145)
Total Bilirubin: 0.5 mg/dL (ref 0.3–1.2)
Total Protein: 6.6 g/dL (ref 6.5–8.1)

## 2020-10-28 LAB — URIC ACID: Uric Acid, Serum: 3.8 mg/dL (ref 2.5–7.1)

## 2020-10-28 MED ORDER — ACETAMINOPHEN 325 MG PO TABS
650.0000 mg | ORAL_TABLET | Freq: Once | ORAL | Status: AC
  Administered 2020-10-28: 650 mg via ORAL

## 2020-10-28 MED ORDER — ACETAMINOPHEN 325 MG PO TABS
ORAL_TABLET | ORAL | Status: AC
  Filled 2020-10-28: qty 2

## 2020-10-28 MED ORDER — DIPHENHYDRAMINE HCL 50 MG/ML IJ SOLN
INTRAMUSCULAR | Status: AC
  Filled 2020-10-28: qty 1

## 2020-10-28 MED ORDER — FAMOTIDINE IN NACL 20-0.9 MG/50ML-% IV SOLN
20.0000 mg | Freq: Once | INTRAVENOUS | Status: AC
  Administered 2020-10-28: 20 mg via INTRAVENOUS

## 2020-10-28 MED ORDER — FAMOTIDINE IN NACL 20-0.9 MG/50ML-% IV SOLN
INTRAVENOUS | Status: AC
  Filled 2020-10-28: qty 50

## 2020-10-28 MED ORDER — METHYLPREDNISOLONE SODIUM SUCC 125 MG IJ SOLR
INTRAMUSCULAR | Status: AC
  Filled 2020-10-28: qty 2

## 2020-10-28 MED ORDER — METHYLPREDNISOLONE SODIUM SUCC 125 MG IJ SOLR
125.0000 mg | Freq: Once | INTRAMUSCULAR | Status: AC
  Administered 2020-10-28: 125 mg via INTRAVENOUS

## 2020-10-28 MED ORDER — SODIUM CHLORIDE 0.9% FLUSH
10.0000 mL | INTRAVENOUS | Status: DC | PRN
  Filled 2020-10-28: qty 10

## 2020-10-28 MED ORDER — SODIUM CHLORIDE 0.9 % IV SOLN
1000.0000 mg | Freq: Once | INTRAVENOUS | Status: AC
  Administered 2020-10-28: 1000 mg via INTRAVENOUS
  Filled 2020-10-28: qty 40

## 2020-10-28 MED ORDER — HEPARIN SOD (PORK) LOCK FLUSH 100 UNIT/ML IV SOLN
500.0000 [IU] | Freq: Once | INTRAVENOUS | Status: DC | PRN
  Filled 2020-10-28: qty 5

## 2020-10-28 MED ORDER — SODIUM CHLORIDE 0.9 % IV SOLN
Freq: Once | INTRAVENOUS | Status: AC
  Filled 2020-10-28: qty 250

## 2020-10-28 MED ORDER — MONTELUKAST SODIUM 10 MG PO TABS
10.0000 mg | ORAL_TABLET | Freq: Once | ORAL | Status: AC
  Administered 2020-10-28: 10 mg via ORAL

## 2020-10-28 MED ORDER — DIPHENHYDRAMINE HCL 50 MG/ML IJ SOLN
50.0000 mg | Freq: Once | INTRAMUSCULAR | Status: AC
  Administered 2020-10-28: 50 mg via INTRAVENOUS

## 2020-10-28 MED ORDER — MONTELUKAST SODIUM 10 MG PO TABS
ORAL_TABLET | ORAL | Status: AC
  Filled 2020-10-28: qty 1

## 2020-10-28 NOTE — Patient Instructions (Signed)
[facility] Discharge Instructions for Patients Receiving Chemotherapy  Today you received the following chemotherapy agents: Gazyva   To help prevent nausea and vomiting after your treatment, we encourage you to take your nausea medication as directed.    If you develop nausea and vomiting that is not controlled by your nausea medication, call the clinic.   BELOW ARE SYMPTOMS THAT SHOULD BE REPORTED IMMEDIATELY:  *FEVER GREATER THAN 100.5 F  *CHILLS WITH OR WITHOUT FEVER  NAUSEA AND VOMITING THAT IS NOT CONTROLLED WITH YOUR NAUSEA MEDICATION  *UNUSUAL SHORTNESS OF BREATH  *UNUSUAL BRUISING OR BLEEDING  TENDERNESS IN MOUTH AND THROAT WITH OR WITHOUT PRESENCE OF ULCERS  *URINARY PROBLEMS  *BOWEL PROBLEMS  UNUSUAL RASH Items with * indicate a potential emergency and should be followed up as soon as possible.  Feel free to call the clinic should you have any questions or concerns. The clinic phone number is [phone].  Please show the CHEMO ALERT CARD at check-in to the Emergency Department and triage nurse.   

## 2020-11-01 ENCOUNTER — Other Ambulatory Visit: Payer: Self-pay

## 2020-11-01 ENCOUNTER — Encounter: Payer: Self-pay | Admitting: Cardiovascular Disease

## 2020-11-01 ENCOUNTER — Ambulatory Visit: Payer: 59 | Admitting: Cardiovascular Disease

## 2020-11-01 VITALS — BP 150/80 | Ht 61.0 in | Wt 139.0 lb

## 2020-11-01 DIAGNOSIS — E785 Hyperlipidemia, unspecified: Secondary | ICD-10-CM

## 2020-11-01 DIAGNOSIS — C9110 Chronic lymphocytic leukemia of B-cell type not having achieved remission: Secondary | ICD-10-CM | POA: Diagnosis not present

## 2020-11-01 DIAGNOSIS — I10 Essential (primary) hypertension: Secondary | ICD-10-CM

## 2020-11-01 DIAGNOSIS — D0502 Lobular carcinoma in situ of left breast: Secondary | ICD-10-CM | POA: Diagnosis not present

## 2020-11-01 MED ORDER — CARVEDILOL 6.25 MG PO TABS: 6.2500 mg | ORAL_TABLET | Freq: Two times a day (BID) | ORAL | 1 refills | Status: DC

## 2020-11-01 NOTE — Patient Instructions (Signed)
Medication Instructions:  Start back Amlodipine 5 mg daily  The current medical regimen is effective;  continue present plan and medications.  *If you need a refill on your cardiac medications before your next appointment, please call your pharmacy*  Follow-Up: At [facility], you and your health needs are our priority.  As part of our continuing mission to provide you with exceptional heart care, we have created designated Provider Care Teams.  These Care Teams include your primary Cardiologist (physician) and Advanced Practice Providers (APPs -  Physician Assistants and Nurse Practitioners) who all work together to provide you with the care you need, when you need it.  We recommend signing up for the patient portal called "MyChart".  Sign up information is provided on this After Visit Summary.  MyChart is used to connect with patients for Virtual Visits (Telemedicine).  Patients are able to view lab/test results, encounter notes, upcoming appointments, etc.  Non-urgent messages can be sent to your provider as well.   To learn more about what you can do with MyChart, go to [email or web address].    Your next appointment:   6 month(s)  The format for your next appointment:   In Person  Provider:   Shelva Majestic, MD

## 2020-11-01 NOTE — Progress Notes (Unsigned)
Patient ID: Colleen Shannon, female   DOB: 09-Apr-1956, 65 y.o.   MRN: [id number]     HPI: Colleen Shannon is a 65 y.o. female presents to the office today for a 8 month cardiology evaluation.  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  Colleen Shannon has a history of hypertension and previous chest pain for which he was started on carvedilol and initially nifedipine by Dr. Elisabeth Cara with a presumptive dose of possible spasm. In August 2013 a nuclear perfusion study was entirely normal. 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 She has been on lipid-lowering therapy with atorvastatin. When I saw her 2 years ago I discontinued her nifedipine and switched her to amlodipine. She states her blood pressure has been controlled on this regimen. An echo Doppler study in August 2013 showed normal systolic function. She did have mild mitral regurgitation.  Over the past year, she has done well from a cardiac standpoint.  She denies any episodes of chest pain.  He denies palpitations.  She is now followed by Dr. Carol Ada for primary care.  Recent laboratory had been drawn for which she was told was excellent.  Colleen Shannon underwent right knee replacement surgery by Dr. Sandie Ano on 05/11/2014.  She tolerated that well from a cardiovascular standpoint.  She underwent knee replacement to her left knee in November 2017.  She also was diagnosed as having CLL.  She is followed by Dr. Lindi Adie. .  She continues to take tamoxifen for previous carcinoma in situ on breast biopsy.   I last saw her in January 2019 at which time she had noticed palpitations which may occur 4-5 days per week.  These typically are isolated and last for approximately 20 seconds.  She denies any sustained episodes of tachycardia.  She denies chest pain.  I recommended slight titration of her current date of carvedilol up to  9.375 mg twice a day.  Her blood pressure was stable and she was advised to continue her current dose of amlodipine.    Follow-up echo Doppler study in February 2019 which showed an EF of 55 to 60% with grade 1 diastolic dysfunction.  There was mild mitral regurgitation.  Pulmonary pressures were normal.    She was seen in April 2019 by Almyra Deforest, PA and had noted that her baseline heart rate had been increasing to the 80s.  She was drinking 1 cup of coffee per day.  Carvedilol was further increased to 12.5 mg twice a day.  When I last saw her in July 2019 she denied any episodes of chest pain but was concerned since her younger brother had suffered an MI and had required insertion of for coronary stents.  During that evaluation she was inquiring about screening for CAD.   She underwent a CT cardiac scoring study which showed a coronary calcium score of 0.  Her chest CT did show diffuse bronchial wall thickening.  She states as result of her CLL she does have episodes of bronchitis frequently.  I last saw her in November 2019 unfortunately her brother died at age 63 and was ultimately found to have a pituitary adenoma with development of Cushing's disease and 12 days following his resection of his adenoma he apparently had a bleed requiring mechanical ventilation and ultimately developed multisystem organ failure leading to his death.  This has been stressful  for Colleen Shannon.  She denied any chest pain.   She has had difficulty with her right knee.  She had previously undergone bilateral knee replacements.  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  She was followed by Dr. Leland Johns for her left breast cancer originally diagnosed in 2015 and currently on tamoxifen as well as her CLL with most recent white blood count 39.9 thousand which has been fairly stable.     I last saw her in December 2020.  Since my last evaluation, she is now being followed by Dr. Irene Limbo for her CLL.  She is felt to have RAI stage I chronic lymphocytic leukemia.  She is now undergoing treatment with Gazyva.  Since I last saw her she had dieted and lost weight down to 120 pounds.As result her blood pressure became low and she ultimately stopped taking amlodipine.  She is continued to be on carvedilol at a reduced dose of 6.25 mg twice a day and she takes dexamethasone with [place].  Recently she has gained some of the weight back but has noticed that her blood pressure has been slowly increasing.  She denies any chest tightness or pressure.  She denies palpitations, presyncope or syncope.  She presents for evaluation.   Past Medical History:  Diagnosis Date  . Arthritis   . Breast CA ([place]) dx'd 2015   left  . Bronchiectasis ([place])   . Cancer (H[facility])    hx of skin cancer   . CLL (chronic lymphocytic leukemia) ([place]) dx'd 2015  . Dyspnea   . Dysrhythmia    PVCs  . Elevated cholesterol 05/22/2012   Nuc stress test-Normal.  . Endometrial polyp   . GERD (gastroesophageal reflux disease)   . Grade I diastolic dysfunction    noted on echo  . History of palpitations   . Hypertension 05/22/12   ECHO-EF >55% trace tricupsid regurgitation. There is mild mitral regurgitation.   . MR (mitral regurgitation)    mild noted on echo  . Plantar fasciitis of left foot    resolved  . Pneumonia    history of x 2  . PONV (postoperative nausea and vomiting)     Past Surgical History:  Procedure Laterality Date  . ABDOMINAL SURGERY     Abdominoplasty  . BRONCHIAL BIOPSY  06/29/2020   Procedure: BRONCHIAL BIOPSIES;  Surgeon: Collene Gobble, MD;  Location[facility];  Service: Cardiopulmonary;;  . BRONCHIAL BRUSHINGS  06/29/2020   Procedure: BRONCHIAL BRUSHINGS;  Surgeon: Collene Gobble, MD;  Location: [place];  Service: Cardiopulmonary;;  . BRONCHIAL WASHINGS  06/29/2020    Procedure: BRONCHIAL WASHINGS;  Surgeon: Collene Gobble, MD;  Location: [facility] ENDOSCOPY;  Service: Cardiopulmonary;;  . CESAREAN SECTION     X 2  . CHOLECYSTECTOMY    . COLONSCOPY    . DILATION AND CURETTAGE OF UTERUS    . FOOT SURGERY Left   . HYSTEROSCOPY    . KNEE ARTHROTOMY Right 06/04/2019   Procedure: Right knee arthrotomy; scar excision;  Surgeon: [name], MD;  Location: [facility] ORS;  Service: Orthopedics;  Laterality: Right;  44mn  . KNEE CLOSED REDUCTION Right 07/20/2014   Procedure: RIGHT KNEE CLOSED MANIPULATION;  Surgeon: FGearlean Alf MD;  Location: [facility] ORS;  Service: Orthopedics;  Laterality: Right;  . KNEE SURGERY Right    arthroscopic  . PARTIAL KNEE  ARTHROPLASTY Left 2017  . RADIOACTIVE SEED GUIDED EXCISIONAL BREAST BIOPSY N/A 10/06/2014   Procedure: RADIOACTIVE SEED GUIDED EXCISIONAL BREAST BIOPSY;  Surgeon: Rolm Bookbinder, MD;  Location: [place];  Service: General;  Laterality: N/A;  . REFRACTIVE SURGERY    . TOTAL KNEE ARTHROPLASTY Right 05/11/2014   Procedure: RIGHT TOTAL KNEE ARTHROPLASTY;  Surgeon: [name], MD;  Location: [facility] OR[facility];  Service: Orthopedics;  Laterality: Right;  . TUBAL LIGATION    . VIDEO BRONCHOSCOPY N/A 06/29/2020   Procedure: VIDEO BRONCHOSCOPY WITHOUT FLUORO;  Surgeon: Collene Gobble, MD;  Location[facility] ENDOSCOPY;  Service: Cardiopulmonary;  Laterality: N/A;  with BAL    No Known Allergies  Current Outpatient Medications  Medication Sig Dispense Refill  . albuterol (VENTOLIN HFA) 108 (90 Base) MCG/ACT inhaler Inhale 1-2 puffs into the lungs every 6 (six) hours as needed for wheezing or shortness of breath.    Marland Kitchen amLODipine (NORVASC) 5 MG tablet TAKE 1 TABLET BY MOUTH AT  BEDTIME 90 tablet 3  . atorvastatin (LIPITOR) 20 MG tablet Take 1 tablet (20 mg total) by mouth at bedtime.    . Calcium Carb-Cholecalciferol (CALCIUM-VITAMIN D3) 600-400 MG-UNIT TABS Take 1 tablet by mouth 2 (two) times daily.    . cholecalciferol  (VITAMIN D3) 25 MCG (1000 UNIT) tablet Take 1,000 Units by mouth in the morning and at bedtime.    . Cyanocobalamin (B-12) 2500 MCG TABS Take 2,500 mcg by mouth daily.    Marland Kitchen dexamethasone (DECADRON) 4 MG tablet Take 2 tabs (47m) the evening before and in the morning for 3 days after each treatment with Gazyva 30 tablet 4  . ezetimibe (ZETIA) 10 MG tablet Take 1 tablet (10 mg total) by mouth at bedtime. 30 tablet 0  . Multiple Vitamin (MULTIVITAMIN WITH MINERALS) TABS tablet Take 1 tablet by mouth daily.    . Omega-3 Fatty Acids (FISH OIL) 1200 MG CAPS Take 1,200 mg by mouth in the morning and at bedtime. 120 mg-180 mg    . Omeprazole 20 MG TBEC Take 20 mg by mouth in the morning.     . ondansetron (ZOFRAN) 8 MG tablet Take 1 tablet (8 mg total) by mouth every 8 (eight) hours as needed for nausea. 30 tablet 3  . Turmeric 500 MG TABS Take 1,000 mg by mouth daily.     . VENCLEXTA 100 MG tablet TAKE 2 TABLETS BY MOUTH  ONCE DAILY WITH FOOD AND A  FULL GLASS OF WATER 60 tablet 0  . carvedilol (COREG) 6.25 MG tablet Take 1 tablet (6.25 mg total) by mouth 2 (two) times daily with a meal. 180 tablet 1   No current facility-administered medications for this visit.    Social History   Socioeconomic History  . Marital status: Married    Spouse name: Not on file  . Number of children: Not on file  . Years of education: Not on file  . Highest education level: Not on file  Occupational History  . Not on file  Tobacco Use  . Smoking status: Never Smoker  . Smokeless tobacco: Never Used  Vaping Use  . Vaping Use: Never used  Substance and Sexual Activity  . Alcohol use: Yes    Comment: occasional   . Drug use: No  . Sexual activity: Yes    Birth control/protection: Surgical, Post-menopausal    Comment: Hysterectomy  Other Topics Concern  . Not on file  Social History Narrative  . Not on file  Social Determinants of Health   Financial Resource Strain: Not on file  Food Insecurity: Not on  file  Transportation Needs: Not on file  Physical Activity: Not on file  Stress: Not on file  Social Connections: Not on file  Intimate Partner Violence: Not on file   Additional social history is notable that she is married for 35 years. 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 There is no tobacco use. He does drink occasional wine. .  She is status post bilateral knee replacements.   Family History  Problem Relation Age of Onset  . Heart disease Father   . Dementia Father   . Aneurysm Mother   . Hyperlipidemia Mother   . Alzheimer's disease Mother   . Osteoarthritis Mother   . Hypertension Brother   . High Cholesterol Brother   . Cancer - Other Paternal Grandmother        uterine  . Hypertension Paternal Grandfather   . Stroke Paternal Grandfather   . Heart disease Maternal Grandfather   . Cancer - Other Maternal Grandfather        lung  . Cushing syndrome Brother   . Healthy Son   . Healthy Daughter    ROS General: Negative; No fevers, chills, or night sweats;  HEENT: Negative; No changes in vision or hearing, sinus congestion, difficulty swallowing Pulmonary: Negative; No cough, wheezing, shortness of breath, hemoptysis Cardiovascular: Negative; No chest pain, presyncope, syncope, palpitations GI: Negative; No nausea, vomiting, diarrhea, or abdominal pain GU: Negative; No dysuria, hematuria, or difficulty voiding Musculoskeletal: Positive for bilateral knee replacement surgery;positive for osteoarthritis no myalgias,  Hematologic/Oncology: positive for CLL, history of breast cancer Endocrine: Negative; no heat/cold intolerance; no diabetes Neuro: Negative; no changes in balance, headaches Skin: Negative; No rashes or skin lesions Psychiatric: Negative; No behavioral problems, depression Sleep: Negative; No snoring, daytime sleepiness, hypersomnolence, bruxism, restless legs, hypnogognic hallucinations, no  cataplexy Other comprehensive 14 point system review is negative.  PE BP (!) 150/80 (BP Location: Left Arm, Patient Position: Sitting)   Ht _0  (1.549 m)   Wt 139 lb (63 kg)   LMP 11/19/2011 (LMP Unknown)   SpO2 99%   BMI 26.26 kg/m    Repeat blood pressure by me was elevated at 160/80  Wt Readings from Last 3 Encounters:  11/01/20 139 lb (63 kg)  10/28/20 137 lb 12.8 oz (62.5 kg)  10/08/20 130 lb (59 kg)    BP (!) 150/80 (BP Location: Left Arm, Patient Position: Sitting)   Ht _1  (1.549 m)   Wt 139 lb (63 kg)   LMP 11/19/2011 (LMP Unknown)   SpO2 99%   BMI 26.26 kg/m  General: Alert, oriented, no distress.  Skin: normal turgor, no rashes, warm and dry HEENT: Normocephalic, atraumatic. Pupils equal round and reactive to light; sclera anicteric; extraocular muscles intact;  Nose without nasal septal hypertrophy Mouth/Parynx benign; Mallinpatti scale 2 Neck: No JVD, no carotid bruits; normal carotid upstroke Lungs: clear to ausculatation and percussion; no wheezing or rales Chest wall: without tenderness to palpitation Heart: PMI not displaced, RRR, s1 s2 normal, 1/6 systolic murmur, no diastolic murmur, no rubs, gallops, thrills, or heaves Abdomen: soft, nontender; no hepatosplenomehaly, BS+; abdominal aorta nontender and not dilated by palpation. Back: no CVA tenderness Pulses 2+ Musculoskeletal: full range of motion, normal strength, no joint deformities Extremities: no clubbing cyanosis or edema, Homan's sign negative  Neurologic: grossly nonfocal; Cranial nerves grossly  wnl Psychologic: Normal mood and affect  ECG (independently read by me): NSR at 67  December 2020 ECG (independently read by me): Normal sinus rhythm at 77 bpm.  No ST segment changes.  No ectopy.  Normal intervals.  November 2019 ECG (independently read by me): Normal sinus rhythm at 69 bpm.  No ectopy.  Normal intervals.  July 2019 ECG (independently read by me): Normal sinus rhythm at 64  bpm.  No ectopy.  Normal intervals.  No ST segment changes.  January 2019 ECG (independently read by me): normal sinus rhythm at 76 bpm.  Normal intervals.  No ectopy or ST changes  November 2016 ECG (independently read by me) from 07/17/2014: Normal sinus rhythm.  No ectopy.  Normal intervals.  No ST segment changes.  August 2014 ECG: Normal sinus rhythm at 75 beats per minute without ectopy. Normal intervals.  LABS: BMP Latest Ref Rng & Units 10/28/2020 10/08/2020 09/30/2020  Glucose 70 - 99 mg/dL 137(H) 132(H) 86  BUN 8 - 23 mg/dL _0 Creatinine 0.44 - 1.00 mg/dL 0.64 0.75 0.72  BUN/Creat Ratio 12 - 28 - - -  Sodium 135 - 145 mmol/L 140 137 139  Potassium 3.5 - 5.1 mmol/L 4.1 3.7 4.3  Chloride 98 - 111 mmol/L 107 101 103  CO2 22 - 32 mmol/L _1 Calcium 8.9 - 10.3 mg/dL 9.2 9.0 9.3   Hepatic Function Latest Ref Rng & Units 10/28/2020 10/08/2020 09/30/2020  Total Protein 6.5 - 8.1 g/dL 6.6 6.3(L) 6.1(L)  Albumin 3.5 - 5.0 g/dL 4.1 4.1 3.8  AST 15 - 41 U/L 16 33 18  ALT 0 - 44 U/L 19 36 27  Alk Phosphatase 38 - 126 U/L 48 42 42  Total Bilirubin 0.3 - 1.2 mg/dL 0.5 0.8 0.6   CBC Latest Ref Rng & Units 10/28/2020 10/08/2020 09/30/2020  WBC 4.0 - 10.5 K/uL 5.1 2.9(L) 4.8  Hemoglobin 12.0 - 15.0 g/dL 13.9 13.1 13.0  Hematocrit 36.0 - 46.0 % 41.2 39.7 39.1  Platelets 150 - 400 K/uL 160 128(L) 157   Lab Results  Component Value Date   MCV 87.3 10/28/2020   MCV 91.1 10/08/2020   MCV 89.5 09/30/2020   Lab Results  Component Value Date   TSH 1.860 01/30/2018   Lipid Panel     Component Value Date/Time   CHOL 131 07/15/2018 0830   TRIG 84 07/15/2018 0830   HDL 56 07/15/2018 0830   CHOLHDL 2.3 07/15/2018 0830   CHOLHDL 2.3 08/19/2015 0857   VLDL 17 08/19/2015 0857   LDLCALC 58 07/15/2018 0830    RADIOLOGY: No results found.  IMPRESSION:  1. Essential hypertension   2. Chronic lymphocytic leukemia of B-cell type not having achieved remission ([place])   3.  Hyperlipidemia with target LDL less than 70   4. 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  In August 2013 a nuclear perfusion study was normal.   When I saw her in January 2019, her blood pressure was mildly elevated based on recent change in guidelines and she was experiencing some episodic palpitations.  Her palpitations improved with slight titration of carvedilol and when seen several months later her dose was further titrated to 12.5 mg twice a day.  When I last  saw her, her blood pressure was stable on amlodipine 5 mg and carvedilol 12.5 mg twice a day.  However in the past year she had purposeful weight loss and with her weight reduction her blood pressure became low and ultimately she discontinued amlodipine and her carvedilol dose was reduced.  She is now followed by Dr.Kale for her CLL and is undergoing with Venclexta and Gazyva.  Her blood pressure today is elevated and repeat by me was 160/80.  Her pulse was 67.  I have suggested resumption of amlodipine 5 mg daily.  She will monitor her blood pressure.  She continues to be on atorvastatin and Zetia 10 mg for hyperlipidemia.  Most recent lipid studies were excellent with total cholesterol 137, HDL 52, triglycerides 69, and LDL 71.  She is not having any chest pain symptoms.  She will contact our office if blood pressure continues to be an issue.  Otherwise if stable I will see her in 6 months for reevaluation or sooner as needed.   Troy Sine, MD, [facility]  11/02/2020 5:36 PM

## 2020-11-02 ENCOUNTER — Encounter: Payer: Self-pay | Admitting: Cardiovascular Disease

## 2020-11-08 ENCOUNTER — Other Ambulatory Visit: Payer: Self-pay | Admitting: Hematology

## 2020-11-11 ENCOUNTER — Other Ambulatory Visit: Payer: Self-pay

## 2020-11-11 ENCOUNTER — Other Ambulatory Visit: Payer: Self-pay | Admitting: Hematology

## 2020-11-11 ENCOUNTER — Inpatient Hospital Stay: Payer: 59 | Attending: Hematology and Oncology

## 2020-11-11 ENCOUNTER — Telehealth: Payer: Self-pay | Admitting: *Deleted

## 2020-11-11 DIAGNOSIS — D709 Neutropenia, unspecified: Secondary | ICD-10-CM

## 2020-11-11 DIAGNOSIS — Z5112 Encounter for antineoplastic immunotherapy: Secondary | ICD-10-CM | POA: Diagnosis not present

## 2020-11-11 DIAGNOSIS — C9110 Chronic lymphocytic leukemia of B-cell type not having achieved remission: Secondary | ICD-10-CM

## 2020-11-11 LAB — CBC WITH DIFFERENTIAL/PLATELET
Abs Immature Granulocytes: 0 10*3/uL (ref 0.00–0.07)
Basophils Absolute: 0 10*3/uL (ref 0.0–0.1)
Basophils Relative: 1 %
Eosinophils Absolute: 0 10*3/uL (ref 0.0–0.5)
Eosinophils Relative: 1 %
HCT: 37.6 % (ref 36.0–46.0)
Hemoglobin: 12.8 g/dL (ref 12.0–15.0)
Immature Granulocytes: 0 %
Lymphocytes Relative: 66 %
Lymphs Abs: 1.3 10*3/uL (ref 0.7–4.0)
MCH: 29.8 pg (ref 26.0–34.0)
MCHC: 34 g/dL (ref 30.0–36.0)
MCV: 87.6 fL (ref 80.0–100.0)
Monocytes Absolute: 0.4 10*3/uL (ref 0.1–1.0)
Monocytes Relative: 22 %
Neutro Abs: 0.2 10*3/uL — CL (ref 1.7–7.7)
Neutrophils Relative %: 10 %
Platelets: 173 10*3/uL (ref 150–400)
RBC: 4.29 MIL/uL (ref 3.87–5.11)
RDW: 14.3 % (ref 11.5–15.5)
WBC: 1.9 10*3/uL — ABNORMAL LOW (ref 4.0–10.5)
nRBC: 0 % (ref 0.0–0.2)

## 2020-11-11 LAB — URIC ACID: Uric Acid, Serum: 3.6 mg/dL (ref 2.5–7.1)

## 2020-11-11 LAB — CMP (CANCER CENTER ONLY)
ALT: 19 U/L (ref 0–44)
AST: 18 U/L (ref 15–41)
Albumin: 3.9 g/dL (ref 3.5–5.0)
Alkaline Phosphatase: 35 U/L — ABNORMAL LOW (ref 38–126)
Anion gap: 7 (ref 5–15)
BUN: 15 mg/dL (ref 8–23)
CO2: 28 mmol/L (ref 22–32)
Calcium: 9.1 mg/dL (ref 8.9–10.3)
Chloride: 104 mmol/L (ref 98–111)
Creatinine: 0.68 mg/dL (ref 0.44–1.00)
GFR, Estimated: >60 mL/min (ref >60)
Glucose, Bld: 99 mg/dL (ref 70–99)
Potassium: 4.1 mmol/L (ref 3.5–5.1)
Sodium: 139 mmol/L (ref 135–145)
Total Bilirubin: 0.6 mg/dL (ref 0.3–1.2)
Total Protein: 6.2 g/dL — ABNORMAL LOW (ref 6.5–8.1)

## 2020-11-11 NOTE — Telephone Encounter (Signed)
Contacted patient at Dr. Grier Mitts request with his response related to [name] 0.2 reported earlier this morning: Given her neutropenia would recommend she hold off on venetoclax immediately. We will need to set her up for additional labs in 1 week CBC with differential CMP(ordered). We will keep the infusion on 11/25/2020 with labs and MD visit. Follow strict neutropenic precautions. Ensure she is taking her B12 and will also add vitamin B complex 1 capsule p.o. daily over-the-counter to avoid any other B vitamin deficiencies worsening bone marrow suppression from venetoclax. Her recent Covid infection could also be making her bone marrow more sensitive to the medication.  Patient verbalized understanding of instructions - she will stop Venetoclax; continue B12 and start B complex. She will observe strict neutropenic precautions.   Appt made for lab for 2/10 at 0915.  Patient in agreement with lab appt time.

## 2020-11-11 NOTE — Telephone Encounter (Signed)
CRITICAL VALUE STICKER  CRITICAL VALUE:  ANC 0.2  RECEIVER (on-site recipient of call): Georgina Pillion, RN  DATE & TIME NOTIFIED: 11/11/20; 1122  MESSENGER (representative from lab): Lelan Pons  MD NOTIFIED: Dr.Kale   TIME OF NOTIFICATION: 1130  RESPONSE: Orders/instructions received

## 2020-11-18 ENCOUNTER — Telehealth: Payer: Self-pay | Admitting: *Deleted

## 2020-11-18 ENCOUNTER — Other Ambulatory Visit: Payer: Self-pay

## 2020-11-18 ENCOUNTER — Inpatient Hospital Stay: Payer: 59

## 2020-11-18 DIAGNOSIS — Z5112 Encounter for antineoplastic immunotherapy: Secondary | ICD-10-CM | POA: Diagnosis not present

## 2020-11-18 DIAGNOSIS — D709 Neutropenia, unspecified: Secondary | ICD-10-CM

## 2020-11-18 LAB — CMP (CANCER CENTER ONLY)
ALT: 21 U/L (ref 0–44)
AST: 19 U/L (ref 15–41)
Albumin: 3.8 g/dL (ref 3.5–5.0)
Alkaline Phosphatase: 45 U/L (ref 38–126)
Anion gap: 7 (ref 5–15)
BUN: 13 mg/dL (ref 8–23)
CO2: 30 mmol/L (ref 22–32)
Calcium: 9.3 mg/dL (ref 8.9–10.3)
Chloride: 104 mmol/L (ref 98–111)
Creatinine: 0.68 mg/dL (ref 0.44–1.00)
GFR, Estimated: >60 mL/min (ref >60)
Glucose, Bld: 85 mg/dL (ref 70–99)
Potassium: 4.3 mmol/L (ref 3.5–5.1)
Sodium: 141 mmol/L (ref 135–145)
Total Bilirubin: 0.5 mg/dL (ref 0.3–1.2)
Total Protein: 6 g/dL — ABNORMAL LOW (ref 6.5–8.1)

## 2020-11-18 LAB — CBC WITH DIFFERENTIAL/PLATELET
Abs Immature Granulocytes: 0.01 10*3/uL (ref 0.00–0.07)
Basophils Absolute: 0 10*3/uL (ref 0.0–0.1)
Basophils Relative: 2 %
Eosinophils Absolute: 0 10*3/uL (ref 0.0–0.5)
Eosinophils Relative: 1 %
HCT: 38.5 % (ref 36.0–46.0)
Hemoglobin: 13.2 g/dL (ref 12.0–15.0)
Immature Granulocytes: 1 %
Lymphocytes Relative: 59 %
Lymphs Abs: 1.1 10*3/uL (ref 0.7–4.0)
MCH: 30 pg (ref 26.0–34.0)
MCHC: 34.3 g/dL (ref 30.0–36.0)
MCV: 87.5 fL (ref 80.0–100.0)
Monocytes Absolute: 0.4 10*3/uL (ref 0.1–1.0)
Monocytes Relative: 20 %
Neutro Abs: 0.3 10*3/uL — CL (ref 1.7–7.7)
Neutrophils Relative %: 17 %
Platelets: 216 10*3/uL (ref 150–400)
RBC: 4.4 MIL/uL (ref 3.87–5.11)
RDW: 14.2 % (ref 11.5–15.5)
WBC: 1.9 10*3/uL — ABNORMAL LOW (ref 4.0–10.5)
nRBC: 0 % (ref 0.0–0.2)

## 2020-11-18 NOTE — Telephone Encounter (Signed)
CRITICAL VALUE STICKER  CRITICAL VALUE: ANC 0.3  RECEIVER (on-site recipient of call): Georgina Pillion, RN  DATE & TIME NOTIFIED: 11/18/20; 0935  MESSENGER (representative from lab): Rosann Auerbach  MD NOTIFIED: Dr.Kale   TIME OF NOTIFICATION: 0950  RESPONSE: Information acknowledged

## 2020-11-18 NOTE — Telephone Encounter (Signed)
Per Dr. Irene Limbo - please contact patient: continue holding Venetoclax and continue neutropenic precautions as ANC is 0.3. Patient verbalized understanding. She asked that Dr. Irene Limbo be informed that she has intermittent shortness of breath. States it is not noticeable all the time. She is able to get out and take a walk, but gets winded sometimes. Dr. Irene Limbo informed.

## 2020-11-24 ENCOUNTER — Other Ambulatory Visit: Payer: Self-pay | Admitting: *Deleted

## 2020-11-24 DIAGNOSIS — C9110 Chronic lymphocytic leukemia of B-cell type not having achieved remission: Secondary | ICD-10-CM

## 2020-11-24 NOTE — Progress Notes (Signed)
HEMATOLOGY/ONCOLOGY CLINIC NOTE  Date of Service: 11/24/2020  Patient Care Team: Carol Ada, MD as PCP - General (Family Medicine) Troy Sine, MD as PCP - Cardiology (Cardiology)  CHIEF COMPLAINTS/PURPOSE OF CONSULTATION:  CLL  HISTORY OF PRESENTING ILLNESS:  Colleen Shannon is a wonderful 65 y.o. female who has been referred to [place] by Dr. Lindi Adie for evaluation and management of CLL. The pt reports that she is doing well overall.   The pt reports that she was being seen by Dr. Lindi Adie for Lobular Carcinoma in situ (LCIS) of the left breast. She had a lumpectomy and recently completed 5 years of Tamoxifen for breast cancer prevention. Pt was having frequent respiratory infections 3-4 years ago and after some labwork was found to have CLL. Dr. Lindi Adie has been monitoring this and pt has never required treatment. She has not had as many issues with recurrent infections in the last 2-3 years. She has had two SCC lesions found and removed. Her last was found 8-10 years ago.   She has felt well over the last 6-12 months. Pt has had lymphadenopathy over the years that often swells and shrinks rapidly. About two weeks ago she began to notice bilateral neck swelling. It was uncomfortable at times and affected her breathing. Her neck swelling is starting to settle down and is no longer causing her discomfort. She received her last vaccine ([place]) in March and has not had any infections recently. Pt has joint pain for which she saw Dr. Estanislado Pandy who did not think that the pt has RA.   01/31/2016 Peripheral Blood Flow Cytometry Report revealed "Chronic Lymphocytic Leukemia."  Most recent lab results (12/01/2019) of CBC is as follows: all values are WNL except for WBC at 43.4K, PLT at 145K, Lymphs Abs at 37.2K, Mono Abs at 3.0K, Baso Abs at 0.2K, Glucose at 135, Total Protein at 6.1, ALP at 37. 12/01/2019 LDH at 285  On review of systems, pt reports joint pain, improving lumps and denies  fevers, chills, sweats, fatigue, unexpected weight loss, abdominal pain and any other symptoms.   On PMHx the pt reports Lobular Carcinoma in situ, CLL, Lumpectomy, Arthritis, Pneumonia, SCC. 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  INTERVAL HISTORY:  Colleen Shannon is a wonderful 65 y.o. female who is here for evaluation and management of CLL. The patient's last visit with [place] was on 10/28/2020.The pt reports that she is doing well overall. She is here for C5D1 Gazyva.  The pt reports that her fatigue has improved, but she still experiences intermittent SOB that is not bothersome to her way of life. She notes her hair is thinning out. She still experiences a slight cough intermittently.  Lab results today 11/25/2020 of CBC w/diff and CMP is as follows: all values are WNL except for Hgb of 15.2, Neutro Abs of 8.8K, Lymphs Abs of 0.5K, Glucose of 258.  On review of systems, pt reports SOB, cough and denies fevers, chills, night sweats, fatigue, abdominal pain, back pain, leg swelling and any other symptoms.  MEDICAL HISTORY:  Past Medical History:  Diagnosis Date  . Arthritis   . Breast CA ([place]) dx'd 2015   left  . Bronchiectasis ([place])   . Cancer (H[facility])    hx of skin cancer   . CLL (chronic lymphocytic leukemia) ([place]) dx'd 2015  . Dyspnea   . Dysrhythmia    PVCs  . Elevated cholesterol 05/22/2012   Nuc stress test-Normal.  .  Endometrial polyp   . GERD (gastroesophageal reflux disease)   . Grade I diastolic dysfunction    noted on echo  . History of palpitations   . Hypertension 05/22/12   ECHO-EF >55% trace tricupsid regurgitation. There is mild mitral regurgitation.   . MR (mitral regurgitation)    mild noted on echo  . Plantar fasciitis of left foot    resolved  . Pneumonia    history of x 2  . PONV (postoperative nausea and vomiting)     SURGICAL HISTORY: Past Surgical History:  Procedure Laterality Date  . ABDOMINAL SURGERY      Abdominoplasty  . BRONCHIAL BIOPSY  06/29/2020   Procedure: BRONCHIAL BIOPSIES;  Surgeon: Collene Gobble, MD;  Location[facility] ENDOSCOPY;  Service: Cardiopulmonary;;  . BRONCHIAL BRUSHINGS  06/29/2020   Procedure: BRONCHIAL BRUSHINGS;  Surgeon: Collene Gobble, MD;  Location: [place];  Service: Cardiopulmonary;;  . BRONCHIAL WASHINGS  06/29/2020   Procedure: BRONCHIAL WASHINGS;  Surgeon: Collene Gobble, MD;  Location: [facility] ENDOSCOPY;  Service: Cardiopulmonary;;  . CESAREAN SECTION     X 2  . CHOLECYSTECTOMY    . COLONSCOPY    . DILATION AND CURETTAGE OF UTERUS    . FOOT SURGERY Left   . HYSTEROSCOPY    . KNEE ARTHROTOMY Right 06/04/2019   Procedure: Right knee arthrotomy; scar excision;  Surgeon: [name], MD;  Location: [facility] ORS;  Service: Orthopedics;  Laterality: Right;  44mn  . KNEE CLOSED REDUCTION Right 07/20/2014   Procedure: RIGHT KNEE CLOSED MANIPULATION;  Surgeon: FGearlean Alf MD;  Location: [facility] ORS;  Service: Orthopedics;  Laterality: Right;  . KNEE SURGERY Right    arthroscopic  . PARTIAL KNEE ARTHROPLASTY Left 2017  . RADIOACTIVE SEED GUIDED EXCISIONAL BREAST BIOPSY N/A 10/06/2014   Procedure: RADIOACTIVE SEED GUIDED EXCISIONAL BREAST BIOPSY;  Surgeon: MRolm Bookbinder MD;  [place]  Service: General;  Laterality: N/A;  . REFRACTIVE SURGERY    . TOTAL KNEE ARTHROPLASTY Right 05/11/2014   Procedure: RIGHT TOTAL KNEE ARTHROPLASTY;  Surgeon: FGearlean Alf MD;  Location: [facility] ORS;  Service: Orthopedics;  Laterality: Right;  . TUBAL LIGATION    . VIDEO BRONCHOSCOPY N/A 06/29/2020   Procedure: VIDEO BRONCHOSCOPY WITHOUT FLUORO;  Surgeon: BCollene Gobble MD;  [facility];  Service: Cardiopulmonary;  Laterality: N/A;  with BAL    SOCIAL HISTORY: Social History   Socioeconomic History  . Marital status: Married    Spouse name: Not on file  . Number of children: Not on file  . Years of education: Not on file  . Highest education  level: Not on file  Occupational History  . Not on file  Tobacco Use  . Smoking status: Never Smoker  . Smokeless tobacco: Never Used  Vaping Use  . Vaping Use: Never used  Substance and Sexual Activity  . Alcohol use: Yes    Comment: occasional   . Drug use: No  . Sexual activity: Yes    Birth control/protection: Surgical, Post-menopausal    Comment: Hysterectomy  Other Topics Concern  . Not on file  Social History Narrative  . Not on file   Social Determinants of Health   Financial Resource Strain: Not on file  Food Insecurity: Not on file  Transportation Needs: Not on file  Physical Activity: Not on file  Stress: Not on file  Social Connections: Not on file  Intimate Partner Violence: Not on file    FAMILY HISTORY: Family  History  Problem Relation Age of Onset  . Heart disease Father   . Dementia Father   . Aneurysm Mother   . Hyperlipidemia Mother   . Alzheimer's disease Mother   . Osteoarthritis Mother   . Hypertension Brother   . High Cholesterol Brother   . Cancer - Other Paternal Grandmother        uterine  . Hypertension Paternal Grandfather   . Stroke Paternal Grandfather   . Heart disease Maternal Grandfather   . Cancer - Other Maternal Grandfather        lung  . Cushing syndrome Brother   . Healthy Son   . Healthy Daughter     ALLERGIES:  has No Known Allergies.  MEDICATIONS:  Current Outpatient Medications  Medication Sig Dispense Refill  . albuterol (VENTOLIN HFA) 108 (90 Base) MCG/ACT inhaler Inhale 1-2 puffs into the lungs every 6 (six) hours as needed for wheezing or shortness of breath.    Marland Kitchen amLODipine (NORVASC) 5 MG tablet TAKE 1 TABLET BY MOUTH AT  BEDTIME 90 tablet 3  . atorvastatin (LIPITOR) 20 MG tablet Take 1 tablet (20 mg total) by mouth at bedtime.    . Calcium Carb-Cholecalciferol (CALCIUM-VITAMIN D3) 600-400 MG-UNIT TABS Take 1 tablet by mouth 2 (two) times daily.    . carvedilol (COREG) 6.25 MG tablet Take 1 tablet (6.25 mg  total) by mouth 2 (two) times daily with a meal. 180 tablet 1  . cholecalciferol (VITAMIN D3) 25 MCG (1000 UNIT) tablet Take 1,000 Units by mouth in the morning and at bedtime.    . Cyanocobalamin (B-12) 2500 MCG TABS Take 2,500 mcg by mouth daily.    Marland Kitchen dexamethasone (DECADRON) 4 MG tablet Take 2 tabs (58m) the evening before and in the morning for 3 days after each treatment with Gazyva 30 tablet 4  . ezetimibe (ZETIA) 10 MG tablet Take 1 tablet (10 mg total) by mouth at bedtime. 30 tablet 0  . Multiple Vitamin (MULTIVITAMIN WITH MINERALS) TABS tablet Take 1 tablet by mouth daily.    . Omega-3 Fatty Acids (FISH OIL) 1200 MG CAPS Take 1,200 mg by mouth in the morning and at bedtime. 120 mg-180 mg    . Omeprazole 20 MG TBEC Take 20 mg by mouth in the morning.     . ondansetron (ZOFRAN) 8 MG tablet Take 1 tablet (8 mg total) by mouth every 8 (eight) hours as needed for nausea. 30 tablet 3  . Turmeric 500 MG TABS Take 1,000 mg by mouth daily.     . VENCLEXTA 100 MG tablet TAKE 2 TABLETS BY MOUTH  ONCE DAILY WITH FOOD AND A  FULL GLASS OF WATER 60 tablet 0   No current facility-administered medications for this visit.    REVIEW OF SYSTEMS:   10 Point review of Systems was done is negative except as noted above.  PHYSICAL EXAMINATION: ECOG PERFORMANCE STATUS: 0 - Asymptomatic .LMP 11/19/2011 (LMP Unknown)  .Pulse (!) 102   Temp 98.1 F (36.7 C) (Tympanic)   Resp 17   Ht _0  (1.549 m)   Wt 142 lb 7 oz (64.6 kg)   LMP 11/19/2011 (LMP Unknown)   SpO2 99%   BMI 26.91 kg/m   GENERAL:alert, in no acute distress and comfortable SKIN: no acute rashes, no significant lesions EYES: conjunctiva are pink and non-injected, sclera anicteric OROPHARYNX: MMM, no exudates, no oropharyngeal erythema or ulceration NECK: supple, no JVD LYMPH:  no palpable lymphadenopathy in the cervical, axillary or inguinal  regions LUNGS: clear to auscultation b/l with normal respiratory effort HEART: regular rate &  rhythm ABDOMEN:  normoactive bowel sounds , non tender, not distended. Extremity: no pedal edema PSYCH: alert & oriented x 3 with fluent speech NEURO: no focal motor/sensory deficits  LABORATORY DATA:  I have reviewed the data as listed  . CBC Latest Ref Rng & Units 11/25/2020 11/18/2020 11/11/2020  WBC 4.0 - 10.5 K/uL 9.5 1.9(L) 1.9(L)  Hemoglobin 12.0 - 15.0 g/dL 15.2(H) 13.2 12.8  Hematocrit 36.0 - 46.0 % 44.4 38.5 37.6  Platelets 150 - 400 K/uL 230 216 173    . CMP Latest Ref Rng & Units 11/25/2020 11/18/2020 11/11/2020  Glucose 70 - 99 mg/dL 258(H) 85 99  BUN 8 - 23 mg/dL _0 Creatinine 0.44 - 1.00 mg/dL 0.77 0.68 0.68  Sodium 135 - 145 mmol/L 139 141 139  Potassium 3.5 - 5.1 mmol/L 3.9 4.3 4.1  Chloride 98 - 111 mmol/L 104 104 104  CO2 22 - 32 mmol/L _1 Calcium 8.9 - 10.3 mg/dL 9.4 9.3 9.1  Total Protein 6.5 - 8.1 g/dL 7.0 6.0(L) 6.2(L)  Total Bilirubin 0.3 - 1.2 mg/dL 0.5 0.5 0.6  Alkaline Phos 38 - 126 U/L 51 45 35(L)  AST 15 - 41 U/L _2 ALT 0 - 44 U/L _3 03/03/2020 FISH (CLL Prognosis) Panel:     RADIOGRAPHIC STUDIES: I have personally reviewed the radiological images as listed and agreed with the findings in the report. No results found.  ASSESSMENT & PLAN:  65 yo with   1) Rai Stage 1 Chronic lymphocytic Leukemia. Found to have trisomy 12 and 13q deletion via 03/03/2020 FISH (CLL Prognosis) Panel  2) Fever 3) Abnormal LFTs 4) recent COVID 19 infection -- resolved.  PLAN: -Discussed pt labwork today, 11/25/2020; Neutropenia has resolved and counts bounced back. Chemistries normal.  -Hold Venetoclax at this time. Will start back with C6D1 at 69m dosage pending labs. Will monitor again to observe if neutropenia is due to [facility] due to Venetoclax.  -Advised pt there is no need for growth factor support at this time due to neutropenia resolution.  -Recommended pt to avoid handling the feces of dogs and minimize risk of  infections.  -Advise pt she is currently on C5D1 Gazyva, with one more cycle remaining. -The pt has no prohibitive toxicities at this time.  -Recommend pt continue to drink 48-64 oz water daily.  -Will see back in 1 month with labs.  2) Neoplasm of left breast, primary tumor staging category Tis: lobular carcinoma in situ (LCIS) Left breast LCIS status post lumpectomy 10/06/2014; Started tamoxifen for breast cancer risk reduction 20 mg daily 11/18/2014 ,decrease to 10 mg on 11/22/2017 completed February 2021.  Breast cancer surveillance: Mammogram and ultrasound 8/11/2020at [facility]: Benign breast density category B Breast exam 02/23/2020: Benign  PLAN -continue breast self examinations -MMG Q12 months    FOLLOW UP: F/u as per appointments for C6 of Gazyva on 12/23/2020     The total time spent in the appointment was 20 minutes and more than 50% was on counseling and direct patient cares.  All of the patient's questions were answered with apparent satisfaction. The patient knows to call the clinic with any problems, questions or concerns.    GSullivan LoneMD MSt. FlorianAAHIVMS SThe Surgery [facility] LLCCG[facility] Center LPHematology/Oncology Physician [facility]Well[facility] (Office):       [phone](Work cell):  [phone] (Fax):           [phone]  11/24/2020 12:00 PM  I, Reinaldo Raddle, am acting as scribe for Dr. Sullivan Lone, MD.    .I have reviewed the above documentation for accuracy and completeness, and I agree with the above. Brunetta Genera MD

## 2020-11-25 ENCOUNTER — Inpatient Hospital Stay (HOSPITAL_BASED_OUTPATIENT_CLINIC_OR_DEPARTMENT_OTHER): Payer: 59 | Admitting: Hematology

## 2020-11-25 ENCOUNTER — Other Ambulatory Visit: Payer: Self-pay

## 2020-11-25 ENCOUNTER — Inpatient Hospital Stay: Payer: 59

## 2020-11-25 VITALS — BP 139/83 | HR 98 | Temp 97.5°F | Resp 16

## 2020-11-25 VITALS — HR 102 | Temp 98.1°F | Resp 17 | Ht 61.0 in | Wt 142.4 lb

## 2020-11-25 DIAGNOSIS — D709 Neutropenia, unspecified: Secondary | ICD-10-CM | POA: Diagnosis not present

## 2020-11-25 DIAGNOSIS — Z5112 Encounter for antineoplastic immunotherapy: Secondary | ICD-10-CM | POA: Diagnosis not present

## 2020-11-25 DIAGNOSIS — C9110 Chronic lymphocytic leukemia of B-cell type not having achieved remission: Secondary | ICD-10-CM

## 2020-11-25 DIAGNOSIS — Z23 Encounter for immunization: Secondary | ICD-10-CM

## 2020-11-25 DIAGNOSIS — Z7189 Other specified counseling: Secondary | ICD-10-CM

## 2020-11-25 LAB — CBC WITH DIFFERENTIAL (CANCER CENTER ONLY)
Abs Immature Granulocytes: 0.01 10*3/uL (ref 0.00–0.07)
Basophils Absolute: 0 10*3/uL (ref 0.0–0.1)
Basophils Relative: 0 %
Eosinophils Absolute: 0 10*3/uL (ref 0.0–0.5)
Eosinophils Relative: 0 %
HCT: 44.4 % (ref 36.0–46.0)
Hemoglobin: 15.2 g/dL — ABNORMAL HIGH (ref 12.0–15.0)
Immature Granulocytes: 0 %
Lymphocytes Relative: 6 %
Lymphs Abs: 0.5 10*3/uL — ABNORMAL LOW (ref 0.7–4.0)
MCH: 29.7 pg (ref 26.0–34.0)
MCHC: 34.2 g/dL (ref 30.0–36.0)
MCV: 86.9 fL (ref 80.0–100.0)
Monocytes Absolute: 0.1 10*3/uL (ref 0.1–1.0)
Monocytes Relative: 1 %
Neutro Abs: 8.8 10*3/uL — ABNORMAL HIGH (ref 1.7–7.7)
Neutrophils Relative %: 93 %
Platelet Count: 230 10*3/uL (ref 150–400)
RBC: 5.11 MIL/uL (ref 3.87–5.11)
RDW: 13.6 % (ref 11.5–15.5)
WBC Count: 9.5 10*3/uL (ref 4.0–10.5)
nRBC: 0 % (ref 0.0–0.2)

## 2020-11-25 LAB — CMP (CANCER CENTER ONLY)
ALT: 27 U/L (ref 0–44)
AST: 19 U/L (ref 15–41)
Albumin: 4.3 g/dL (ref 3.5–5.0)
Alkaline Phosphatase: 51 U/L (ref 38–126)
Anion gap: 11 (ref 5–15)
BUN: 17 mg/dL (ref 8–23)
CO2: 24 mmol/L (ref 22–32)
Calcium: 9.4 mg/dL (ref 8.9–10.3)
Chloride: 104 mmol/L (ref 98–111)
Creatinine: 0.77 mg/dL (ref 0.44–1.00)
GFR, Estimated: >60 mL/min (ref >60)
Glucose, Bld: 258 mg/dL — ABNORMAL HIGH (ref 70–99)
Potassium: 3.9 mmol/L (ref 3.5–5.1)
Sodium: 139 mmol/L (ref 135–145)
Total Bilirubin: 0.5 mg/dL (ref 0.3–1.2)
Total Protein: 7 g/dL (ref 6.5–8.1)

## 2020-11-25 MED ORDER — ACETAMINOPHEN 325 MG PO TABS
ORAL_TABLET | ORAL | Status: AC
  Filled 2020-11-25: qty 2

## 2020-11-25 MED ORDER — FAMOTIDINE IN NACL 20-0.9 MG/50ML-% IV SOLN
INTRAVENOUS | Status: AC
  Filled 2020-11-25: qty 50

## 2020-11-25 MED ORDER — MONTELUKAST SODIUM 10 MG PO TABS
10.0000 mg | ORAL_TABLET | Freq: Once | ORAL | Status: AC
  Administered 2020-11-25: 10 mg via ORAL

## 2020-11-25 MED ORDER — ACETAMINOPHEN 325 MG PO TABS
650.0000 mg | ORAL_TABLET | Freq: Once | ORAL | Status: AC
  Administered 2020-11-25: 650 mg via ORAL

## 2020-11-25 MED ORDER — FAMOTIDINE IN NACL 20-0.9 MG/50ML-% IV SOLN
20.0000 mg | Freq: Once | INTRAVENOUS | Status: AC
  Administered 2020-11-25: 20 mg via INTRAVENOUS

## 2020-11-25 MED ORDER — METHYLPREDNISOLONE SODIUM SUCC 125 MG IJ SOLR
INTRAMUSCULAR | Status: AC
  Filled 2020-11-25: qty 2

## 2020-11-25 MED ORDER — DIPHENHYDRAMINE HCL 50 MG/ML IJ SOLN
INTRAMUSCULAR | Status: AC
  Filled 2020-11-25: qty 1

## 2020-11-25 MED ORDER — DIPHENHYDRAMINE HCL 50 MG/ML IJ SOLN
50.0000 mg | Freq: Once | INTRAMUSCULAR | Status: AC
  Administered 2020-11-25: 50 mg via INTRAVENOUS

## 2020-11-25 MED ORDER — MONTELUKAST SODIUM 10 MG PO TABS
ORAL_TABLET | ORAL | Status: AC
  Filled 2020-11-25: qty 1

## 2020-11-25 MED ORDER — SODIUM CHLORIDE 0.9 % IV SOLN
1000.0000 mg | Freq: Once | INTRAVENOUS | Status: AC
  Administered 2020-11-25: 1000 mg via INTRAVENOUS
  Filled 2020-11-25: qty 40

## 2020-11-25 MED ORDER — SODIUM CHLORIDE 0.9 % IV SOLN
Freq: Once | INTRAVENOUS | Status: AC
  Filled 2020-11-25: qty 250

## 2020-11-25 MED ORDER — METHYLPREDNISOLONE SODIUM SUCC 125 MG IJ SOLR
125.0000 mg | Freq: Once | INTRAMUSCULAR | Status: AC
  Administered 2020-11-25: 125 mg via INTRAVENOUS

## 2020-11-25 NOTE — Patient Instructions (Signed)
[place] Discharge Instructions for Patients Receiving Chemotherapy  Today you received the following chemotherapy agents Gazyva  To help prevent nausea and vomiting after your treatment, we encourage you to take your nausea medication as directed.  If you develop nausea and vomiting that is not controlled by your nausea medication, call the clinic.   BELOW ARE SYMPTOMS THAT SHOULD BE REPORTED IMMEDIATELY:  *FEVER GREATER THAN 100.5 F  *CHILLS WITH OR WITHOUT FEVER  NAUSEA AND VOMITING THAT IS NOT CONTROLLED WITH YOUR NAUSEA MEDICATION  *UNUSUAL SHORTNESS OF BREATH  *UNUSUAL BRUISING OR BLEEDING  TENDERNESS IN MOUTH AND THROAT WITH OR WITHOUT PRESENCE OF ULCERS  *URINARY PROBLEMS  *BOWEL PROBLEMS  UNUSUAL RASH Items with * indicate a potential emergency and should be followed up as soon as possible.  Feel free to call the clinic should you have any questions or concerns. The clinic phone number is [phone].  Please show the [place] at check-in to the Emergency Department and triage nurse.

## 2020-11-25 NOTE — Progress Notes (Signed)
Dr. Irene Limbo made aware patient's blood sugar is 258 today. Per Dr. Irene Limbo he will continue to monitor blood sugar and it is ok to proceed with treatment today.

## 2020-12-22 NOTE — Progress Notes (Signed)
HEMATOLOGY/ONCOLOGY CLINIC NOTE  Date of Service: 12/23/2020  Patient Care Team: Carol Ada, MD as PCP - General (Family Medicine) Troy Sine, MD as PCP - Cardiology (Cardiology)  CHIEF COMPLAINTS/PURPOSE OF CONSULTATION:  CLL  HISTORY OF PRESENTING ILLNESS:  Colleen Shannon is a wonderful 65 y.o. female who has been referred to [place] by Dr. Lindi Adie for evaluation and management of CLL. The pt reports that she is doing well overall.   The pt reports that she was being seen by Dr. Lindi Adie for Lobular Carcinoma in situ (LCIS) of the left breast. She had a lumpectomy and recently completed 5 years of Tamoxifen for breast cancer prevention. Pt was having frequent respiratory infections 3-4 years ago and after some labwork was found to have CLL. Dr. Lindi Adie has been monitoring this and pt has never required treatment. She has not had as many issues with recurrent infections in the last 2-3 years. She has had two SCC lesions found and removed. Her last was found 8-10 years ago.   She has felt well over the last 6-12 months. Pt has had lymphadenopathy over the years that often swells and shrinks rapidly. About two weeks ago she began to notice bilateral neck swelling. It was uncomfortable at times and affected her breathing. Her neck swelling is starting to settle down and is no longer causing her discomfort. She received her last vaccine ([place]) in March and has not had any infections recently. Pt has joint pain for which she saw Dr. Estanislado Pandy who did not think that the pt has RA.   01/31/2016 Peripheral Blood Flow Cytometry Report revealed "Chronic Lymphocytic Leukemia."  Most recent lab results (12/01/2019) of CBC is as follows: all values are WNL except for WBC at 43.4K, PLT at 145K, Lymphs Abs at 37.2K, Mono Abs at 3.0K, Baso Abs at 0.2K, Glucose at 135, Total Protein at 6.1, ALP at 37. 12/01/2019 LDH at 285  On review of systems, pt reports joint pain, improving lumps and denies  fevers, chills, sweats, fatigue, unexpected weight loss, abdominal pain and any other symptoms.   On PMHx the pt reports Lobular Carcinoma in situ, CLL, Lumpectomy, Arthritis, Pneumonia, SCC. 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  INTERVAL HISTORY:  Colleen Shannon is a wonderful 65 y.o. female who is here for evaluation and management of CLL. The patient's last visit with [place] was on 11/25/2020.The pt reports that she is doing well overall. She is here for [facility].  The pt reports no new concerns since the last visit. She has been feeling well and continus to tolerate the [place] well. The pt notes that she has a pre-cancerous spot on her forehead that she gets frozen every year. The pt notes that this is starting back recently.  Lab results today 12/23/2020 of CBC w/diff and CMP is as follows: all values are WNL except for Neutro Abs of 9.7K, Glucose of 199. 12/23/2020 LDH pending.   On review of systems, pt reports weight gain and denies SOB, changes in breathing, abdominal pain, back pain, leg swelling, and any other symptoms.  MEDICAL HISTORY:  Past Medical History:  Diagnosis Date  . Arthritis   . Breast CA ([place]) dx'd 2015   left  . Bronchiectasis ([place])   . Cancer (HCC)    hx of skin cancer   . CLL (chronic lymphocytic leukemia) ([place]) dx'd 2015  . Dyspnea   . Dysrhythmia    PVCs  .  Elevated cholesterol 05/22/2012   Nuc stress test-Normal.  . Endometrial polyp   . GERD (gastroesophageal reflux disease)   . Grade I diastolic dysfunction    noted on echo  . History of palpitations   . Hypertension 05/22/12   ECHO-EF >55% trace tricupsid regurgitation. There is mild mitral regurgitation.   . MR (mitral regurgitation)    mild noted on echo  . Plantar fasciitis of left foot    resolved  . Pneumonia    history of x 2  . PONV (postoperative nausea and vomiting)     SURGICAL HISTORY: Past Surgical History:  Procedure Laterality Date  .  ABDOMINAL SURGERY     Abdominoplasty  . BRONCHIAL BIOPSY  06/29/2020   Procedure: BRONCHIAL BIOPSIES;  Surgeon: Collene Gobble, MD;  Location: [facility] ENDOSCOPY;  Service: Cardiopulmonary;;  . BRONCHIAL BRUSHINGS  06/29/2020   Procedure: BRONCHIAL BRUSHINGS;  Surgeon: Collene Gobble, MD;  Location: [place];  Service: Cardiopulmonary;;  . BRONCHIAL WASHINGS  06/29/2020   Procedure: BRONCHIAL WASHINGS;  Surgeon: Collene Gobble, MD;  Location: [facility] ENDOSCOPY;  Service: Cardiopulmonary;;  . CESAREAN SECTION     X 2  . CHOLECYSTECTOMY    . COLONSCOPY    . DILATION AND CURETTAGE OF UTERUS    . FOOT SURGERY Left   . HYSTEROSCOPY    . KNEE ARTHROTOMY Right 06/04/2019   Procedure: Right knee arthrotomy; scar excision;  Surgeon: [name], MD;  Location: [facility] ORS;  Service: Orthopedics;  Laterality: Right;  29mn  . KNEE CLOSED REDUCTION Right 07/20/2014   Procedure: RIGHT KNEE CLOSED MANIPULATION;  Surgeon: FGearlean Alf MD;  Location: [facility] ORS;  Service: Orthopedics;  Laterality: Right;  . KNEE SURGERY Right    arthroscopic  . PARTIAL KNEE ARTHROPLASTY Left 2017  . RADIOACTIVE SEED GUIDED EXCISIONAL BREAST BIOPSY N/A 10/06/2014   Procedure: RADIOACTIVE SEED GUIDED EXCISIONAL BREAST BIOPSY;  Surgeon: MRolm Bookbinder MD;  [place]  Service: General;  Laterality: N/A;  . REFRACTIVE SURGERY    . TOTAL KNEE ARTHROPLASTY Right 05/11/2014   Procedure: RIGHT TOTAL KNEE ARTHROPLASTY;  Surgeon: FGearlean Alf MD;  Location: [facility]S;  Service: Orthopedics;  Laterality: Right;  . TUBAL LIGATION    . VIDEO BRONCHOSCOPY N/A 06/29/2020   Procedure: VIDEO BRONCHOSCOPY WITHOUT FLUORO;  Surgeon: BCollene Gobble MD;  [facility];  Service: Cardiopulmonary;  Laterality: N/A;  with BAL    SOCIAL HISTORY: Social History   Socioeconomic History  . Marital status: Married    Spouse name: Not on file  . Number of children: Not on file  . Years of education: Not on file   . Highest education level: Not on file  Occupational History  . Not on file  Tobacco Use  . Smoking status: Never Smoker  . Smokeless tobacco: Never Used  Vaping Use  . Vaping Use: Never used  Substance and Sexual Activity  . Alcohol use: Yes    Comment: occasional   . Drug use: No  . Sexual activity: Yes    Birth control/protection: Surgical, Post-menopausal    Comment: Hysterectomy  Other Topics Concern  . Not on file  Social History Narrative  . Not on file   Social Determinants of Health   Financial Resource Strain: Not on file  Food Insecurity: Not on file  Transportation Needs: Not on file  Physical Activity: Not on file  Stress: Not on file  Social Connections: Not on file  Intimate Partner  Violence: Not on file    FAMILY HISTORY: Family History  Problem Relation Age of Onset  . Heart disease Father   . Dementia Father   . Aneurysm Mother   . Hyperlipidemia Mother   . Alzheimer's disease Mother   . Osteoarthritis Mother   . Hypertension Brother   . High Cholesterol Brother   . Cancer - Other Paternal Grandmother        uterine  . Hypertension Paternal Grandfather   . Stroke Paternal Grandfather   . Heart disease Maternal Grandfather   . Cancer - Other Maternal Grandfather        lung  . Cushing syndrome Brother   . Healthy Son   . Healthy Daughter     ALLERGIES:  has no allergies on file.  MEDICATIONS:  Current Outpatient Medications  Medication Sig Dispense Refill  . albuterol (VENTOLIN HFA) 108 (90 Base) MCG/ACT inhaler Inhale 1-2 puffs into the lungs every 6 (six) hours as needed for wheezing or shortness of breath.    Marland Kitchen amLODipine (NORVASC) 5 MG tablet TAKE 1 TABLET BY MOUTH AT  BEDTIME 90 tablet 3  . atorvastatin (LIPITOR) 20 MG tablet Take 1 tablet (20 mg total) by mouth at bedtime.    . Calcium Carb-Cholecalciferol (CALCIUM-VITAMIN D3) 600-400 MG-UNIT TABS Take 1 tablet by mouth 2 (two) times daily.    . carvedilol (COREG) 6.25 MG tablet  Take 1 tablet (6.25 mg total) by mouth 2 (two) times daily with a meal. 180 tablet 1  . cholecalciferol (VITAMIN D3) 25 MCG (1000 UNIT) tablet Take 1,000 Units by mouth in the morning and at bedtime.    . Cyanocobalamin (B-12) 2500 MCG TABS Take 2,500 mcg by mouth daily.    Marland Kitchen dexamethasone (DECADRON) 4 MG tablet Take 2 tabs (46m) the evening before and in the morning for 3 days after each treatment with Gazyva 30 tablet 4  . ezetimibe (ZETIA) 10 MG tablet Take 1 tablet (10 mg total) by mouth at bedtime. 30 tablet 0  . Multiple Vitamin (MULTIVITAMIN WITH MINERALS) TABS tablet Take 1 tablet by mouth daily.    . Omega-3 Fatty Acids (FISH OIL) 1200 MG CAPS Take 1,200 mg by mouth in the morning and at bedtime. 120 mg-180 mg    . Omeprazole 20 MG TBEC Take 20 mg by mouth in the morning.     . ondansetron (ZOFRAN) 8 MG tablet Take 1 tablet (8 mg total) by mouth every 8 (eight) hours as needed for nausea. 30 tablet 3  . Turmeric 500 MG TABS Take 1,000 mg by mouth daily.     . VENCLEXTA 100 MG tablet TAKE 2 TABLETS BY MOUTH  ONCE DAILY WITH FOOD AND A  FULL GLASS OF WATER 60 tablet 0   No current facility-administered medications for this visit.    REVIEW OF SYSTEMS:   10 Point review of Systems was done is negative except as noted above.  PHYSICAL EXAMINATION: ECOG PERFORMANCE STATUS: 0 - Asymptomatic .LMP 11/19/2011 (LMP Unknown)  .LMP 11/19/2011 (LMP Unknown)  .BP 134/81 (BP Location: Left Arm, Patient Position: Sitting)   Pulse 90   Temp 97.8 F (36.6 C) (Tympanic)   Resp 14   Ht _0  (1.549 m)   Wt 143 lb 14.4 oz (65.3 kg)   LMP 11/19/2011 (LMP Unknown)   SpO2 98%   BMI 27.19 kg/m   GENERAL:alert, in no acute distress and comfortable SKIN: no acute rashes, no significant lesions EYES: conjunctiva are pink and non-injected,  sclera anicteric OROPHARYNX: MMM, no exudates, no oropharyngeal erythema or ulceration NECK: supple, no JVD LYMPH:  no palpable lymphadenopathy in the cervical,  axillary or inguinal regions LUNGS: clear to auscultation b/l with normal respiratory effort HEART: regular rate & rhythm ABDOMEN:  normoactive bowel sounds , non tender, not distended. Extremity: no pedal edema PSYCH: alert & oriented x 3 with fluent speech NEURO: no focal motor/sensory deficits   LABORATORY DATA:  I have reviewed the data as listed  . CBC Latest Ref Rng & Units 12/23/2020 11/25/2020 11/18/2020  WBC 4.0 - 10.5 K/uL 10.5 9.5 1.9(L)  Hemoglobin 12.0 - 15.0 g/dL 14.0 15.2(H) 13.2  Hematocrit 36.0 - 46.0 % 39.9 44.4 38.5  Platelets 150 - 400 K/uL 243 230 216    . CMP Latest Ref Rng & Units 12/23/2020 11/25/2020 11/18/2020  Glucose 70 - 99 mg/dL 199(H) 258(H) 85  BUN 8 - 23 mg/dL _0 Creatinine 0.44 - 1.00 mg/dL 0.78 0.77 0.68  Sodium 135 - 145 mmol/L 140 139 141  Potassium 3.5 - 5.1 mmol/L 4.3 3.9 4.3  Chloride 98 - 111 mmol/L 102 104 104  CO2 22 - 32 mmol/L _1 Calcium 8.9 - 10.3 mg/dL 9.7 9.4 9.3  Total Protein 6.5 - 8.1 g/dL 7.0 7.0 6.0(L)  Total Bilirubin 0.3 - 1.2 mg/dL 0.5 0.5 0.5  Alkaline Phos 38 - 126 U/L 46 51 45  AST 15 - 41 U/L _2 ALT 0 - 44 U/L _3 03/03/2020 FISH (CLL Prognosis) Panel:     RADIOGRAPHIC STUDIES: I have personally reviewed the radiological images as listed and agreed with the findings in the report. No results found.  ASSESSMENT & PLAN:   65 yo with   1) Rai Stage 1 Chronic lymphocytic Leukemia. Found to have trisomy 12 and 13q deletion via 03/03/2020 FISH (CLL Prognosis) Panel  2) Fever 3) Abnormal LFTs 4) recent COVID 19 infection -- resolved.  PLAN: -Discussed pt labwork today, 12/23/2020; blood counts normal. Other labs pending. -Advised pt this is her last planned cycle of Gazyva. -Advised pt that it is okay to freeze or excise the pre cancerous spot on her forehead. Should be no issues with healing. -Advised pt that routine dental work will be okay as long as she is careful.  Recommended antibiotic one dose prior. -Will start Ventoclax back at 148m four days weekly for 2-3 weeks, starting tomorrow. Will switch to daily pending lab stability and continue dose escalation. -Will get repeat scan 1-2 months following completion of Gazyva. Discussed possibility of delaying scan until 4-6 months on Venetoclax. The pt wishes to get it done 1-2 months post-Gazyva. -The pt has no [place]itive toxicities [place] at this [place].  -Recommend pt continue to drink 48-64 oz water daily.  -Will get labs in 3 weeks. -Will get CT Neck/Chest/Abd/Pel in 6-7 weeks. -Will see back in 2 months with labs.   2) Neoplasm of left breast, primary tumor staging category Tis: lobular carcinoma in situ (LCIS) Left breast LCIS status post lumpectomy 10/06/2014; Started tamoxifen for breast cancer risk reduction 20 mg daily 11/18/2014 ,decrease to 10 mg on 11/22/2017 completed February 2021.  Breast cancer surveillance: Mammogram and ultrasound 8/11/2020at [facility]: Benign breast density category B Breast exam 02/23/2020: Benign  PLAN -continue breast self examinations -MMG Q12 months    FOLLOW UP: Labs in 3 weeks CT neck/chest/abd pelvis in 7 weeks. RTC with  Dr Irene Limbo in 2 months with labs    The total time spent in the appointment was 20 minutes and more than 50% was on counseling and direct patient cares.    All of the patient's questions were answered with apparent satisfaction. The patient knows to call the clinic with any problems, questions or concerns.    Sullivan Lone MD [place] AAHIVMS [facility] Hematology/Oncology Physician [facility]  (Office):       [phone] (Work cell):  [phone] (Fax):           [phone]  12/23/2020 10:07 AM  I, Reinaldo Raddle, am acting as scribe for Dr. Sullivan Lone, MD.    .I have reviewed the above documentation for accuracy and completeness, and I agree with the above. Brunetta Genera MD

## 2020-12-23 ENCOUNTER — Inpatient Hospital Stay: Payer: 59

## 2020-12-23 ENCOUNTER — Inpatient Hospital Stay: Payer: 59 | Attending: Hematology and Oncology

## 2020-12-23 ENCOUNTER — Other Ambulatory Visit: Payer: Self-pay

## 2020-12-23 ENCOUNTER — Inpatient Hospital Stay (HOSPITAL_BASED_OUTPATIENT_CLINIC_OR_DEPARTMENT_OTHER): Payer: 59 | Admitting: Hematology

## 2020-12-23 VITALS — BP 134/81 | HR 90 | Temp 97.8°F | Resp 14 | Ht 61.0 in | Wt 143.9 lb

## 2020-12-23 VITALS — BP 113/77 | HR 89 | Temp 98.0°F | Resp 16

## 2020-12-23 DIAGNOSIS — C9110 Chronic lymphocytic leukemia of B-cell type not having achieved remission: Secondary | ICD-10-CM

## 2020-12-23 DIAGNOSIS — Z7189 Other specified counseling: Secondary | ICD-10-CM

## 2020-12-23 DIAGNOSIS — Z853 Personal history of malignant neoplasm of breast: Secondary | ICD-10-CM | POA: Diagnosis not present

## 2020-12-23 DIAGNOSIS — Z5112 Encounter for antineoplastic immunotherapy: Secondary | ICD-10-CM | POA: Insufficient documentation

## 2020-12-23 LAB — CBC WITH DIFFERENTIAL/PLATELET
Abs Immature Granulocytes: 0.02 10*3/uL (ref 0.00–0.07)
Basophils Absolute: 0 10*3/uL (ref 0.0–0.1)
Basophils Relative: 0 %
Eosinophils Absolute: 0 10*3/uL (ref 0.0–0.5)
Eosinophils Relative: 0 %
HCT: 39.9 % (ref 36.0–46.0)
Hemoglobin: 14 g/dL (ref 12.0–15.0)
Immature Granulocytes: 0 %
Lymphocytes Relative: 7 %
Lymphs Abs: 0.7 10*3/uL (ref 0.7–4.0)
MCH: 29.9 pg (ref 26.0–34.0)
MCHC: 35.1 g/dL (ref 30.0–36.0)
MCV: 85.3 fL (ref 80.0–100.0)
Monocytes Absolute: 0.1 10*3/uL (ref 0.1–1.0)
Monocytes Relative: 1 %
Neutro Abs: 9.7 10*3/uL — ABNORMAL HIGH (ref 1.7–7.7)
Neutrophils Relative %: 92 %
Platelets: 243 10*3/uL (ref 150–400)
RBC: 4.68 MIL/uL (ref 3.87–5.11)
RDW: 13.9 % (ref 11.5–15.5)
WBC: 10.5 10*3/uL (ref 4.0–10.5)
nRBC: 0 % (ref 0.0–0.2)

## 2020-12-23 LAB — CMP (CANCER CENTER ONLY)
ALT: 24 U/L (ref 0–44)
AST: 20 U/L (ref 15–41)
Albumin: 4.4 g/dL (ref 3.5–5.0)
Alkaline Phosphatase: 46 U/L (ref 38–126)
Anion gap: 11 (ref 5–15)
BUN: 17 mg/dL (ref 8–23)
CO2: 27 mmol/L (ref 22–32)
Calcium: 9.7 mg/dL (ref 8.9–10.3)
Chloride: 102 mmol/L (ref 98–111)
Creatinine: 0.78 mg/dL (ref 0.44–1.00)
GFR, Estimated: >60 mL/min (ref >60)
Glucose, Bld: 199 mg/dL — ABNORMAL HIGH (ref 70–99)
Potassium: 4.3 mmol/L (ref 3.5–5.1)
Sodium: 140 mmol/L (ref 135–145)
Total Bilirubin: 0.5 mg/dL (ref 0.3–1.2)
Total Protein: 7 g/dL (ref 6.5–8.1)

## 2020-12-23 LAB — LACTATE DEHYDROGENASE: LDH: 212 U/L — ABNORMAL HIGH (ref 98–192)

## 2020-12-23 MED ORDER — ACETAMINOPHEN 325 MG PO TABS
650.0000 mg | ORAL_TABLET | Freq: Once | ORAL | Status: AC
  Administered 2020-12-23: 650 mg via ORAL

## 2020-12-23 MED ORDER — MONTELUKAST SODIUM 10 MG PO TABS
ORAL_TABLET | ORAL | Status: AC
  Filled 2020-12-23: qty 1

## 2020-12-23 MED ORDER — SODIUM CHLORIDE 0.9 % IV SOLN
Freq: Once | INTRAVENOUS | Status: AC
  Filled 2020-12-23: qty 250

## 2020-12-23 MED ORDER — SODIUM CHLORIDE 0.9 % IV SOLN
1000.0000 mg | Freq: Once | INTRAVENOUS | Status: AC
  Administered 2020-12-23: 1000 mg via INTRAVENOUS
  Filled 2020-12-23: qty 40

## 2020-12-23 MED ORDER — FAMOTIDINE IN NACL 20-0.9 MG/50ML-% IV SOLN
20.0000 mg | Freq: Once | INTRAVENOUS | Status: AC
  Administered 2020-12-23: 20 mg via INTRAVENOUS

## 2020-12-23 MED ORDER — DIPHENHYDRAMINE HCL 50 MG/ML IJ SOLN
INTRAMUSCULAR | Status: AC
  Filled 2020-12-23: qty 1

## 2020-12-23 MED ORDER — DIPHENHYDRAMINE HCL 50 MG/ML IJ SOLN
50.0000 mg | Freq: Once | INTRAMUSCULAR | Status: AC
  Administered 2020-12-23: 50 mg via INTRAVENOUS

## 2020-12-23 MED ORDER — ACETAMINOPHEN 325 MG PO TABS
ORAL_TABLET | ORAL | Status: AC
  Filled 2020-12-23: qty 2

## 2020-12-23 MED ORDER — FAMOTIDINE 20 MG PO TABS
ORAL_TABLET | ORAL | Status: AC
  Filled 2020-12-23: qty 1

## 2020-12-23 MED ORDER — FAMOTIDINE IN NACL 20-0.9 MG/50ML-% IV SOLN
INTRAVENOUS | Status: AC
  Filled 2020-12-23: qty 50

## 2020-12-23 MED ORDER — METHYLPREDNISOLONE SODIUM SUCC 125 MG IJ SOLR
125.0000 mg | Freq: Once | INTRAMUSCULAR | Status: AC
Start: 2020-12-23 — End: 2020-12-23
  Administered 2020-12-23: 125 mg via INTRAVENOUS

## 2020-12-23 MED ORDER — METHYLPREDNISOLONE SODIUM SUCC 125 MG IJ SOLR
INTRAMUSCULAR | Status: AC
  Filled 2020-12-23: qty 2

## 2020-12-23 MED ORDER — MONTELUKAST SODIUM 10 MG PO TABS
10.0000 mg | ORAL_TABLET | Freq: Once | ORAL | Status: AC
  Administered 2020-12-23: 10 mg via ORAL

## 2020-12-23 NOTE — Patient Instructions (Signed)
[place] Discharge Instructions for Patients Receiving Chemotherapy  Today you received the following chemotherapy agents: Gazyva   To help prevent nausea and vomiting after your treatment, we encourage you to take your nausea medication as directed.    If you develop nausea and vomiting that is not controlled by your nausea medication, call the clinic.   BELOW ARE SYMPTOMS THAT SHOULD BE REPORTED IMMEDIATELY:  *FEVER GREATER THAN 100.5 F  *CHILLS WITH OR WITHOUT FEVER  NAUSEA AND VOMITING THAT IS NOT CONTROLLED WITH YOUR NAUSEA MEDICATION  *UNUSUAL SHORTNESS OF BREATH  *UNUSUAL BRUISING OR BLEEDING  TENDERNESS IN MOUTH AND THROAT WITH OR WITHOUT PRESENCE OF ULCERS  *URINARY PROBLEMS  *BOWEL PROBLEMS  UNUSUAL RASH Items with * indicate a potential emergency and should be followed up as soon as possible.  Feel free to call the clinic should you have any questions or concerns. The clinic phone number is [phone].  Please show the [place] at check-in to the Emergency Department and triage nurse.

## 2020-12-27 ENCOUNTER — Telehealth: Payer: Self-pay | Admitting: Hematology

## 2020-12-27 NOTE — Telephone Encounter (Signed)
Scheduled follow-up appointments per 3/17 los. Patient is aware. ?

## 2021-01-11 ENCOUNTER — Encounter: Payer: Self-pay | Admitting: Hematology

## 2021-01-13 ENCOUNTER — Inpatient Hospital Stay: Payer: 59 | Attending: Hematology and Oncology

## 2021-01-13 ENCOUNTER — Encounter: Payer: Self-pay | Admitting: Hematology

## 2021-01-13 ENCOUNTER — Other Ambulatory Visit: Payer: Self-pay

## 2021-01-13 DIAGNOSIS — C9110 Chronic lymphocytic leukemia of B-cell type not having achieved remission: Secondary | ICD-10-CM

## 2021-01-13 LAB — CMP (CANCER CENTER ONLY)
ALT: 24 U/L (ref 0–44)
AST: 23 U/L (ref 15–41)
Albumin: 4.3 g/dL (ref 3.5–5.0)
Alkaline Phosphatase: 44 U/L (ref 38–126)
Anion gap: 11 (ref 5–15)
BUN: 22 mg/dL (ref 8–23)
CO2: 26 mmol/L (ref 22–32)
Calcium: 9.5 mg/dL (ref 8.9–10.3)
Chloride: 103 mmol/L (ref 98–111)
Creatinine: 0.75 mg/dL (ref 0.44–1.00)
GFR, Estimated: >60 mL/min (ref >60)
Glucose, Bld: 108 mg/dL — ABNORMAL HIGH (ref 70–99)
Potassium: 4.7 mmol/L (ref 3.5–5.1)
Sodium: 140 mmol/L (ref 135–145)
Total Bilirubin: 0.7 mg/dL (ref 0.3–1.2)
Total Protein: 6.7 g/dL (ref 6.5–8.1)

## 2021-01-13 LAB — CBC WITH DIFFERENTIAL/PLATELET
Abs Immature Granulocytes: 0 10*3/uL (ref 0.00–0.07)
Basophils Absolute: 0 10*3/uL (ref 0.0–0.1)
Basophils Relative: 1 %
Eosinophils Absolute: 0.1 10*3/uL (ref 0.0–0.5)
Eosinophils Relative: 1 %
HCT: 39.5 % (ref 36.0–46.0)
Hemoglobin: 13.8 g/dL (ref 12.0–15.0)
Immature Granulocytes: 0 %
Lymphocytes Relative: 31 %
Lymphs Abs: 1.3 10*3/uL (ref 0.7–4.0)
MCH: 30.7 pg (ref 26.0–34.0)
MCHC: 34.9 g/dL (ref 30.0–36.0)
MCV: 88 fL (ref 80.0–100.0)
Monocytes Absolute: 0.4 10*3/uL (ref 0.1–1.0)
Monocytes Relative: 10 %
Neutro Abs: 2.3 10*3/uL (ref 1.7–7.7)
Neutrophils Relative %: 57 %
Platelets: 203 10*3/uL (ref 150–400)
RBC: 4.49 MIL/uL (ref 3.87–5.11)
RDW: 14.9 % (ref 11.5–15.5)
WBC: 4.1 10*3/uL (ref 4.0–10.5)
nRBC: 0 % (ref 0.0–0.2)

## 2021-01-18 ENCOUNTER — Other Ambulatory Visit: Payer: Self-pay | Admitting: Hematology

## 2021-01-24 ENCOUNTER — Encounter: Payer: Self-pay | Admitting: Hematology

## 2021-02-03 ENCOUNTER — Encounter: Payer: Self-pay | Admitting: Hematology

## 2021-02-03 ENCOUNTER — Other Ambulatory Visit: Payer: Self-pay | Admitting: Hematology

## 2021-02-03 ENCOUNTER — Other Ambulatory Visit: Payer: Self-pay | Admitting: Pharmacist

## 2021-02-03 ENCOUNTER — Other Ambulatory Visit (HOSPITAL_COMMUNITY): Payer: Self-pay

## 2021-02-03 DIAGNOSIS — C9110 Chronic lymphocytic leukemia of B-cell type not having achieved remission: Secondary | ICD-10-CM

## 2021-02-03 MED ORDER — VENETOCLAX 100 MG PO TABS: 200.0000 mg | ORAL_TABLET | Freq: Every day | ORAL | 1 refills | Status: DC

## 2021-02-03 MED ORDER — VENETOCLAX 100 MG PO TABS
200.0000 mg | ORAL_TABLET | Freq: Every day | ORAL | 1 refills | Status: DC
  Filled 2021-02-03: qty 60, 30d supply, fill #0

## 2021-02-03 NOTE — Progress Notes (Signed)
Oral Oncology Pharmacist Encounter  Prescription refill for Venclexta (venetoclax) sent to [facility] in error. Patient's insurance required prescription to be filled through [employer] Prescription redirected for dispensing.   Leron Croak, PharmD, BCPS Hematology/Oncology Clinical Pharmacist [facility] [phone] 02/03/2021 1:42 PM

## 2021-02-17 ENCOUNTER — Ambulatory Visit (HOSPITAL_COMMUNITY): Payer: 59

## 2021-02-17 ENCOUNTER — Other Ambulatory Visit: Payer: Self-pay

## 2021-02-17 ENCOUNTER — Ambulatory Visit (HOSPITAL_COMMUNITY)
Admission: RE | Admit: 2021-02-17 | Discharge: 2021-02-17 | Disposition: A | Discharge status: Home / Self Care | Payer: 59 | Source: Ambulatory Visit | Attending: Hematology | Admitting: Hematology

## 2021-02-17 DIAGNOSIS — C9110 Chronic lymphocytic leukemia of B-cell type not having achieved remission: Secondary | ICD-10-CM | POA: Diagnosis not present

## 2021-02-17 DIAGNOSIS — Z5112 Encounter for antineoplastic immunotherapy: Secondary | ICD-10-CM | POA: Insufficient documentation

## 2021-02-17 LAB — POCT I-STAT CREATININE: Creatinine, Ser: 0.6 mg/dL (ref 0.44–1.00)

## 2021-02-17 MED ORDER — IOHEXOL 300 MG/ML  SOLN
100.0000 mL | Freq: Once | INTRAMUSCULAR | Status: AC | PRN
  Administered 2021-02-17: 100 mL via INTRAVENOUS

## 2021-02-21 NOTE — Progress Notes (Signed)
HEMATOLOGY/ONCOLOGY CLINIC NOTE  Date of Service: 02/22/2021   Patient Care Team: Carol Ada, MD as PCP - General (Family Medicine) Troy Sine, MD as PCP - Cardiology (Cardiology)  CHIEF COMPLAINTS/PURPOSE OF CONSULTATION:  CLL  HISTORY OF PRESENTING ILLNESS:  Colleen Shannon is a wonderful 65 y.o. female who has been referred to [place] by Dr. Lindi Adie for evaluation and management of CLL. The pt reports that she is doing well overall.   The pt reports that she was being seen by Dr. Lindi Adie for Lobular Carcinoma in situ (LCIS) of the left breast. She had a lumpectomy and recently completed 5 years of Tamoxifen for breast cancer prevention. Pt was having frequent respiratory infections 3-4 years ago and after some labwork was found to have CLL. Dr. Lindi Adie has been monitoring this and pt has never required treatment. She has not had as many issues with recurrent infections in the last 2-3 years. She has had two SCC lesions found and removed. Her last was found 8-10 years ago.   She has felt well over the last 6-12 months. Pt has had lymphadenopathy over the years that often swells and shrinks rapidly. About two weeks ago she began to notice bilateral neck swelling. It was uncomfortable at times and affected her breathing. Her neck swelling is starting to settle down and is no longer causing her discomfort. She received her last vaccine ([place]) in March and has not had any infections recently. Pt has joint pain for which she saw Dr. Estanislado Pandy who did not think that the pt has RA.   01/31/2016 Peripheral Blood Flow Cytometry Report revealed "Chronic Lymphocytic Leukemia."  Most recent lab results (12/01/2019) of CBC is as follows: all values are WNL except for WBC at 43.4K, PLT at 145K, Lymphs Abs at 37.2K, Mono Abs at 3.0K, Baso Abs at 0.2K, Glucose at 135, Total Protein at 6.1, ALP at 37. 12/01/2019 LDH at 285  On review of systems, pt reports joint pain, improving lumps and denies  fevers, chills, sweats, fatigue, unexpected weight loss, abdominal pain and any other symptoms.   On PMHx the pt reports Lobular Carcinoma in situ, CLL, Lumpectomy, Arthritis, Pneumonia, SCC. 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  INTERVAL HISTORY:  Colleen Shannon is a wonderful 65 y.o. female who is here for evaluation and management of CLL. The patient's last visit with [place] was on 12/23/2020.The pt reports that she is doing well overall.  The pt reports that she continues to have cortisone injections in her right foot due to pain over the last two years that is worsening. She mentioned that her Podiatrist is wanting to do surgery soon due to increasing demand of steroid injections.  Of note since the patient's last visit, pt has had CT C/A/P w contrast ([id number]) on 02/17/2021, which revealed "Near complete resolution of prior lymphadenopathy in the chest, abdomen, and pelvis. Residual small subcentimeter lymph nodes, as described above, within normal limits. Prior splenomegaly has resolved."  CT Soft Tissue neck w contrast ([id number]) on 02/17/2021, which revealed "Marked improvement in adenopathy since 06/08/2020."  Lab results today 02/22/2021 of CBC w/diff and CMP is as follows: all values are WNL except for WBC of 3.7K, Glucose of 107, BUN of 24. 02/22/2021 LDH of 200.  On review of systems, pt reports chronic foot/ankle pain and denies back pain, abdominal pain, leg swelling and any other symptoms.  MEDICAL HISTORY:  Past Medical History:  Diagnosis Date  .  Arthritis   . Breast CA ([place]) dx'd 2015   left  . Bronchiectasis ([place])   . Cancer (H[facility])    hx of skin cancer   . CLL (chronic lymphocytic leukemia) ([place]) dx'd 2015  . Dyspnea   . Dysrhythmia    PVCs  . Elevated cholesterol 05/22/2012   Nuc stress test-Normal.  . Endometrial polyp   . GERD (gastroesophageal reflux disease)   . Grade I diastolic dysfunction    noted on echo  . History  of palpitations   . Hypertension 05/22/12   ECHO-EF >55% trace tricupsid regurgitation. There is mild mitral regurgitation.   . MR (mitral regurgitation)    mild noted on echo  . Plantar fasciitis of left foot    resolved  . Pneumonia    history of x 2  . PONV (postoperative nausea and vomiting)     SURGICAL HISTORY: Past Surgical History:  Procedure Laterality Date  . ABDOMINAL SURGERY     Abdominoplasty  . BRONCHIAL BIOPSY  06/29/2020   Procedure: BRONCHIAL BIOPSIES;  Surgeon: Collene Gobble, MD;  Location: [facility] ENDOSCOPY;  Service: Cardiopulmonary;;  . BRONCHIAL BRUSHINGS  06/29/2020   Procedure: BRONCHIAL BRUSHINGS;  Surgeon: Collene Gobble, MD;  Location: [place];  Service: Cardiopulmonary;;  . BRONCHIAL WASHINGS  06/29/2020   Procedure: BRONCHIAL WASHINGS;  Surgeon: Collene Gobble, MD;  Location: [facility] ENDOSCOPY;  Service: Cardiopulmonary;;  . CESAREAN SECTION     X 2  . CHOLECYSTECTOMY    . COLONSCOPY    . DILATION AND CURETTAGE OF UTERUS    . FOOT SURGERY Left   . HYSTEROSCOPY    . KNEE ARTHROTOMY Right 06/04/2019   Procedure: Right knee arthrotomy; scar excision;  Surgeon: [name], MD;  Location: [facility] ORS;  Service: Orthopedics;  Laterality: Right;  90mn  . KNEE CLOSED REDUCTION Right 07/20/2014   Procedure: RIGHT KNEE CLOSED MANIPULATION;  Surgeon: FGearlean Alf MD;  Location: [facility] ORS;  Service: Orthopedics;  Laterality: Right;  . KNEE SURGERY Right    arthroscopic  . PARTIAL KNEE ARTHROPLASTY Left 2017  . RADIOACTIVE SEED GUIDED EXCISIONAL BREAST BIOPSY N/A 10/06/2014   Procedure: RADIOACTIVE SEED GUIDED EXCISIONAL BREAST BIOPSY;  Surgeon: MRolm Bookbinder MD;  [place]  Service: General;  Laterality: N/A;  . REFRACTIVE SURGERY    . TOTAL KNEE ARTHROPLASTY Right 05/11/2014   Procedure: RIGHT TOTAL KNEE ARTHROPLASTY;  Surgeon: FGearlean Alf MD;  Location: [facility] ORS;  Service: Orthopedics;  Laterality: Right;  . TUBAL LIGATION     . VIDEO BRONCHOSCOPY N/A 06/29/2020   Procedure: VIDEO BRONCHOSCOPY WITHOUT FLUORO;  Surgeon: BCollene Gobble MD;  [facility];  Service: Cardiopulmonary;  Laterality: N/A;  with BAL    SOCIAL HISTORY: Social History   Socioeconomic History  . Marital status: Married    Spouse name: Not on file  . Number of children: Not on file  . Years of education: Not on file  . Highest education level: Not on file  Occupational History  . Not on file  Tobacco Use  . Smoking status: Never Smoker  . Smokeless tobacco: Never Used  Vaping Use  . Vaping Use: Never used  Substance and Sexual Activity  . Alcohol use: Yes    Comment: occasional   . Drug use: No  . Sexual activity: Yes    Birth control/protection: Surgical, Post-menopausal    Comment: Hysterectomy  Other Topics Concern  . Not on file  Social History Narrative  .  Not on file   Social Determinants of Health   Financial Resource Strain: Not on file  Food Insecurity: Not on file  Transportation Needs: Not on file  Physical Activity: Not on file  Stress: Not on file  Social Connections: Not on file  Intimate Partner Violence: Not on file    FAMILY HISTORY: Family History  Problem Relation Age of Onset  . Heart disease Father   . Dementia Father   . Aneurysm Mother   . Hyperlipidemia Mother   . Alzheimer's disease Mother   . Osteoarthritis Mother   . Hypertension Brother   . High Cholesterol Brother   . Cancer - Other Paternal Grandmother        uterine  . Hypertension Paternal Grandfather   . Stroke Paternal Grandfather   . Heart disease Maternal Grandfather   . Cancer - Other Maternal Grandfather        lung  . Cushing syndrome Brother   . Healthy Son   . Healthy Daughter     ALLERGIES:  has no allergies on file.  MEDICATIONS:  Current Outpatient Medications  Medication Sig Dispense Refill  . albuterol (VENTOLIN HFA) 108 (90 Base) MCG/ACT inhaler Inhale 1-2 puffs into the lungs every 6 (six)  hours as needed for wheezing or shortness of breath.    Marland Kitchen amLODipine (NORVASC) 5 MG tablet TAKE 1 TABLET BY MOUTH AT  BEDTIME 90 tablet 3  . amoxicillin (AMOXIL) 500 MG capsule Take 1,000 mg by mouth 2 (two) times daily.    Marland Kitchen atorvastatin (LIPITOR) 20 MG tablet Take 1 tablet (20 mg total) by mouth at bedtime.    . B Complex Vitamins (B COMPLEX 1 PO)     . Calcium Carb-Cholecalciferol (CALCIUM-VITAMIN D3) 600-400 MG-UNIT TABS Take 1 tablet by mouth 2 (two) times daily.    . carvedilol (COREG) 6.25 MG tablet Take 1 tablet (6.25 mg total) by mouth 2 (two) times daily with a meal. 180 tablet 1  . cholecalciferol (VITAMIN D3) 25 MCG (1000 UNIT) tablet Take 1,000 Units by mouth in the morning and at bedtime.    . Cyanocobalamin (B-12) 2500 MCG TABS Take 2,500 mcg by mouth daily.    Marland Kitchen dexamethasone (DECADRON) 4 MG tablet Take 2 tabs (43m) the evening before and in the morning for 3 days after each treatment with Gazyva 30 tablet 4  . ezetimibe (ZETIA) 10 MG tablet Take 1 tablet (10 mg total) by mouth at bedtime. 30 tablet 0  . ketoconazole (NIZORAL) 2 % cream Apply 1 application topically daily.    . Multiple Vitamin (MULTIVITAMIN WITH MINERALS) TABS tablet Take 1 tablet by mouth daily.    . Omega-3 Fatty Acids (FISH OIL) 1200 MG CAPS Take 1,200 mg by mouth in the morning and at bedtime. 120 mg-180 mg    . Omeprazole 20 MG TBEC Take 20 mg by mouth in the morning.     . ondansetron (ZOFRAN) 8 MG tablet Take 1 tablet (8 mg total) by mouth every 8 (eight) hours as needed for nausea. 30 tablet 3  . Turmeric 500 MG TABS Take 1,000 mg by mouth daily.     .Marland Kitchenvenetoclax (VENCLEXTA) 100 MG tablet Take 2 tablets (200 mg total) by mouth daily. Tablets should be swallowed whole with a meal and a full glass of water. 60 tablet 1   No current facility-administered medications for this visit.    REVIEW OF SYSTEMS:   10 Point review of Systems was done is negative except  as noted above.  PHYSICAL EXAMINATION: ECOG  PERFORMANCE STATUS: 0 - Asymptomatic .BP 125/70 (BP Location: Left Arm)   Pulse 80   Temp 98 F (36.7 C) (Tympanic)   Resp 17   Wt 144 lb 3.2 oz (65.4 kg)   LMP 11/19/2011 (LMP Unknown)   SpO2 100%   BMI 27.25 kg/m  .BP 125/70 (BP Location: Left Arm)   Pulse 80   Temp 98 F (36.7 C) (Tympanic)   Resp 17   Wt 144 lb 3.2 oz (65.4 kg)   LMP 11/19/2011 (LMP Unknown)   SpO2 100%   BMI 27.25 kg/m  .BP 125/70 (BP Location: Left Arm)   Pulse 80   Temp 98 F (36.7 C) (Tympanic)   Resp 17   Wt 144 lb 3.2 oz (65.4 kg)   LMP 11/19/2011 (LMP Unknown)   SpO2 100%   BMI 27.25 kg/m    GENERAL:alert, in no acute distress and comfortable SKIN: no acute rashes, no significant lesions EYES: conjunctiva are pink and non-injected, sclera anicteric OROPHARYNX: MMM, no exudates, no oropharyngeal erythema or ulceration NECK: supple, no JVD LYMPH:  no palpable lymphadenopathy in the cervical, axillary or inguinal regions LUNGS: clear to auscultation b/l with normal respiratory effort HEART: regular rate & rhythm ABDOMEN:  normoactive bowel sounds , non tender, not distended. Extremity: no pedal edema PSYCH: alert & oriented x 3 with fluent speech NEURO: no focal motor/sensory deficits   LABORATORY DATA:  I have reviewed the data as listed  . CBC Latest Ref Rng & Units 02/22/2021 01/13/2021 12/23/2020  WBC 4.0 - 10.5 K/uL 3.7(L) 4.1 10.5  Hemoglobin 12.0 - 15.0 g/dL 14.4 13.8 14.0  Hematocrit 36.0 - 46.0 % 42.3 39.5 39.9  Platelets 150 - 400 K/uL 224 203 243    . CMP Latest Ref Rng & Units 02/22/2021 02/17/2021 01/13/2021  Glucose 70 - 99 mg/dL 107(H) - 108(H)  BUN 8 - 23 mg/dL 24(H) - 22  Creatinine 0.44 - 1.00 mg/dL 0.73 0.60 0.75  Sodium 135 - 145 mmol/L 144 - 140  Potassium 3.5 - 5.1 mmol/L 4.5 - 4.7  Chloride 98 - 111 mmol/L 102 - 103  CO2 22 - 32 mmol/L 31 - 26  Calcium 8.9 - 10.3 mg/dL 10.1 - 9.5  Total Protein 6.5 - 8.1 g/dL 6.8 - 6.7  Total Bilirubin 0.3 - 1.2 mg/dL 0.5 -  0.7  Alkaline Phos 38 - 126 U/L 48 - 44  AST 15 - 41 U/L 22 - 23  ALT 0 - 44 U/L 25 - 24      03/03/2020 FISH (CLL Prognosis) Panel:     RADIOGRAPHIC STUDIES: I have personally reviewed the radiological images as listed and agreed with the findings in the report. CT Soft Tissue Neck W Contrast  Result Date: 02/18/2021 CLINICAL DATA:  CLL EXAM: CT NECK WITH CONTRAST TECHNIQUE: Multidetector CT imaging of the neck was performed using the standard protocol following the bolus administration of intravenous contrast. CONTRAST:  141m OMNIPAQUE IOHEXOL 300 MG/ML  [facility] COMPARISON:  06/08/2020 FINDINGS: Pharynx and larynx: Unremarkable.  No mass or swelling. Salivary glands: Parotid and submandibular glands are unremarkable. Thyroid: Stable appearance. Lymph nodes: Marked improvement in adenopathy. Numerous small nodes remain present, but there are no pathologically enlarged nodes at this time. Vascular: Major neck vessels are patent plaque is present at the right greater than left ICA origins. Retropharyngeal course the ICAs. Limited intracranial: No abnormal enhancement. Visualized orbits: Unremarkable. Mastoids and visualized paranasal sinuses: Visualized mastoid  air cells are clear. Minor paranasal sinus mucosal thickening Skeleton: Degenerative changes of the cervical spine similar to prior study. Upper chest: Dictated separately. Other: None. IMPRESSION: Marked improvement in adenopathy since 06/08/2020. Electronically Signed   By: Macy Mis M.D.   On: 02/18/2021 08:18   CT CHEST ABDOMEN PELVIS W CONTRAST  Result Date: 02/18/2021 CLINICAL DATA:  CLL EXAM: CT CHEST, ABDOMEN, AND PELVIS WITH CONTRAST TECHNIQUE: Multidetector CT imaging of the chest, abdomen and pelvis was performed following the standard protocol during bolus administration of intravenous contrast. CONTRAST:  178m OMNIPAQUE IOHEXOL 300 MG/ML  SOLN COMPARISON:  06/08/2020 FINDINGS: CT CHEST FINDINGS Cardiovascular: The heart is  normal in size. No pericardial effusion. No evidence of thoracic aortic aneurysm. Mild atherosclerotic calcifications of the aortic arch. Mediastinum/Nodes: Near complete resolution of prior thoracic lymphadenopathy. Residual small mediastinal nodes, including a 6 mm short axis high right paratracheal node (series 4/image 15). Residual small bilateral axillary nodes measuring up to 9 mm short axis on the left (series 4/image 15). Visualized thyroid is notable for a 10 mm left thyroid nodule (series 4/image 10). Not clinically significant; no follow-up imaging recommended (ref: J Am Coll Radiol. 2015 Feb;12(2): 143-50). Lungs/Pleura: Lungs are clear. No suspicious pulmonary nodules. No focal consolidation. No pleural effusion or pneumothorax. Musculoskeletal: Visualized osseous structures are within normal limits. CT ABDOMEN PELVIS FINDINGS Hepatobiliary: Liver is within normal limits. Status post cholecystectomy. No intrahepatic or extrahepatic ductal dilatation. Pancreas: Within normal limits. Spleen: Normal in size, improved. Adrenals/Urinary Tract: Adrenal glands are within normal limits. Kidneys are within normal limits.  No hydronephrosis. Bladder is mildly thick-walled although underdistended. Stomach/Bowel: Stomach is within normal limits. No evidence of bowel obstruction. Normal appendix (series 4/image 95). Vascular/Lymphatic: No evidence of abdominal aortic aneurysm. Near complete resolution of prior abdominopelvic lymphadenopathy. Residual 9 mm short axis node in the porta hepatis (series 4/image 60). Residual 7 mm short axis right external iliac node (series 4/image 99). Residual 9 mm short axis right deep inguinal node (series 4/image 108). Reproductive: Uterus and bilateral ovaries are within normal limits. Other: No abdominopelvic ascites. Musculoskeletal: Visualized osseous structures are within normal limits. IMPRESSION: Near complete resolution of prior lymphadenopathy in the chest, abdomen, and  pelvis. Residual small subcentimeter lymph nodes, as described above, within normal limits. Prior splenomegaly has resolved. Electronically Signed   By: SJulian HyM.D.   On: 02/18/2021 08:17    ASSESSMENT & PLAN:   65yo with   1) Rai Stage 1 Chronic lymphocytic Leukemia. Found to have trisomy 12 and 13q deletion via 03/03/2020 FISH (CLL Prognosis) Panel  2) Fever 3) Abnormal LFTs 4) recent COVID 19 infection -- resolved.  PLAN: -Discussed pt labwork today, 02/22/2021; counts and chemistries normal.  -Discussed pt CT C/A/P w contrast ([id number] on 02/17/2021; all lymph nodes resolved. Pt is in remission. -Discussed pt CT Soft Tissue neck w contrast ([id number] on 02/17/2021; pt is in remission with no new findings. -Advised pt we will continue Venetoclax for an additional six months at this time. Can stop after four months if surgery in dire need. -The pt has no prohibitive toxicities from continuing 100 mg Venetoclax daily. -Will increase Venetoclax to 200 mg daily. -Recommend pt continue to drink 48-64 oz water daily.  -Recommended pt wait one month after finishing Venetoclax prior to getting surgery. -Discussed Evusheld and pt's eligibility. Will send referral. Advised pt to wait around one month after this before getting the second booster shot. -Recommended pt receive the second COVID booster  shot as recently approved. Advised pt to wait 4-6 months following first booster shot before getting this. -Will see back in 7 weeks with labs. Pt will be getting labs in 2 weeks for toxicity check.   2) Neoplasm of left breast, primary tumor staging category Tis: lobular carcinoma in situ (LCIS) Left breast LCIS status post lumpectomy 10/06/2014; Started tamoxifen for breast cancer risk reduction 20 mg daily 11/18/2014 ,decrease to 10 mg on 11/22/2017 completed February 2021.  Breast cancer surveillance: Mammogram and ultrasound 8/11/2020at [facility]: Benign breast density category  B Breast exam 02/23/2020: Benign Plan: -continue breast self examinations -MMG Q12 months    FOLLOW UP: Ambulatory referral for Evusheld Labs in 2 weeks RTC with Dr Irene Limbo with labs in 7 weeks    The total time spent in the appointment was 20 minutes and more than 50% was on counseling and direct patient cares.    All of the patient's questions were answered with apparent satisfaction. The patient knows to call the clinic with any problems, questions or concerns.    Sullivan Lone MD [place] AAHIVMS [facility] Hematology/Oncology Physician [facility]  (Office):       [phone] (Work cell):  [phone] (Fax):           [phone]  02/22/2021 10:04 AM  I, Reinaldo Raddle, am acting as scribe for Dr. Sullivan Lone, MD.   .I have reviewed the above documentation for accuracy and completeness, and I agree with the above. Brunetta Genera MD

## 2021-02-22 ENCOUNTER — Other Ambulatory Visit: Payer: Self-pay

## 2021-02-22 ENCOUNTER — Inpatient Hospital Stay: Payer: 59 | Attending: Hematology and Oncology | Admitting: Hematology

## 2021-02-22 ENCOUNTER — Inpatient Hospital Stay: Payer: 59

## 2021-02-22 VITALS — BP 125/70 | HR 80 | Temp 98.0°F | Resp 17 | Wt 144.2 lb

## 2021-02-22 DIAGNOSIS — Z298 Encounter for other specified prophylactic measures: Secondary | ICD-10-CM | POA: Diagnosis not present

## 2021-02-22 DIAGNOSIS — C9110 Chronic lymphocytic leukemia of B-cell type not having achieved remission: Secondary | ICD-10-CM

## 2021-02-22 LAB — CMP (CANCER CENTER ONLY)
ALT: 25 U/L (ref 0–44)
AST: 22 U/L (ref 15–41)
Albumin: 4.3 g/dL (ref 3.5–5.0)
Alkaline Phosphatase: 48 U/L (ref 38–126)
Anion gap: 11 (ref 5–15)
BUN: 24 mg/dL — ABNORMAL HIGH (ref 8–23)
CO2: 31 mmol/L (ref 22–32)
Calcium: 10.1 mg/dL (ref 8.9–10.3)
Chloride: 102 mmol/L (ref 98–111)
Creatinine: 0.73 mg/dL (ref 0.44–1.00)
GFR, Estimated: >60 mL/min (ref >60)
Glucose, Bld: 107 mg/dL — ABNORMAL HIGH (ref 70–99)
Potassium: 4.5 mmol/L (ref 3.5–5.1)
Sodium: 144 mmol/L (ref 135–145)
Total Bilirubin: 0.5 mg/dL (ref 0.3–1.2)
Total Protein: 6.8 g/dL (ref 6.5–8.1)

## 2021-02-22 LAB — CBC WITH DIFFERENTIAL/PLATELET
Abs Immature Granulocytes: 0 10*3/uL (ref 0.00–0.07)
Basophils Absolute: 0 10*3/uL (ref 0.0–0.1)
Basophils Relative: 1 %
Eosinophils Absolute: 0 10*3/uL (ref 0.0–0.5)
Eosinophils Relative: 1 %
HCT: 42.3 % (ref 36.0–46.0)
Hemoglobin: 14.4 g/dL (ref 12.0–15.0)
Immature Granulocytes: 0 %
Lymphocytes Relative: 34 %
Lymphs Abs: 1.3 10*3/uL (ref 0.7–4.0)
MCH: 29.9 pg (ref 26.0–34.0)
MCHC: 34 g/dL (ref 30.0–36.0)
MCV: 87.9 fL (ref 80.0–100.0)
Monocytes Absolute: 0.5 10*3/uL (ref 0.1–1.0)
Monocytes Relative: 13 %
Neutro Abs: 1.9 10*3/uL (ref 1.7–7.7)
Neutrophils Relative %: 51 %
Platelets: 224 10*3/uL (ref 150–400)
RBC: 4.81 MIL/uL (ref 3.87–5.11)
RDW: 13.8 % (ref 11.5–15.5)
WBC: 3.7 10*3/uL — ABNORMAL LOW (ref 4.0–10.5)
nRBC: 0 % (ref 0.0–0.2)

## 2021-02-22 LAB — LACTATE DEHYDROGENASE: LDH: 200 U/L — ABNORMAL HIGH (ref 98–192)

## 2021-02-23 ENCOUNTER — Telehealth: Payer: Self-pay | Admitting: Hematology

## 2021-02-23 NOTE — Telephone Encounter (Signed)
Scheduled follow-up appointments per 5/17 los. Patient is aware. 

## 2021-02-27 ENCOUNTER — Telehealth: Payer: Self-pay | Admitting: Adult Health

## 2021-02-27 NOTE — Telephone Encounter (Signed)
I called patient to discuss Evusheld, a long acting monoclonal antibody injection administered to patients with decreased immune systems or intolerance/allergy to the COVID 19 vaccine as COVID19 prevention.    Unable to reach patient.  LMOM to return my call.  Aoi Kouns, NP  

## 2021-02-28 ENCOUNTER — Encounter: Payer: Self-pay | Admitting: Hematology

## 2021-02-28 ENCOUNTER — Telehealth: Payer: Self-pay | Admitting: Hematology

## 2021-02-28 ENCOUNTER — Other Ambulatory Visit: Payer: Self-pay | Admitting: Adult Health

## 2021-02-28 DIAGNOSIS — C9110 Chronic lymphocytic leukemia of B-cell type not having achieved remission: Secondary | ICD-10-CM

## 2021-02-28 NOTE — Progress Notes (Signed)
I connected by phone with Colleen Shannon on 02/28/2021, 2:40 PM to discuss the potential use of a new treatment, tixagevimab/cilgavimab, for pre-exposure prophylaxis for prevention of coronavirus disease 2019 (COVID-19) caused by the SARS-CoV-2 virus.  This patient is a 65 y.o. female that meets the FDA criteria for Emergency Use Authorization of tixagevimab/cilgavimab for pre-exposure prophylaxis of COVID-19 disease. Pt meets following criteria:  Age >12 yr and weight > 40kg  Not currently infected with SARS-CoV-2 and has no known recent exposure to an individual infected with SARS-CoV-2 AND o Who has moderate to severe immune compromise due to a medical condition or receipt of immunosuppressive medications or treatments and may not mount an adequate immune response to COVID-19 vaccination or  o Vaccination with any available COVID-19 vaccine, according to the approved or authorized schedule, is not recommended due to a history of severe adverse reaction (e.g., severe allergic reaction) to a COVID-19 vaccine(s) and/or COVID-19 vaccine component(s).  o Patient meets the following definition of mod-severe immune compromised status: 1. Received B-cell depleting therapies (e.g. rituximab, obinutuzumab, ocrelizumab, alemtuzumab) within last 6 months & age > or = 64  I have spoken and communicated the following to the patient or parent/caregiver regarding COVID monoclonal antibody treatment:  1. FDA has authorized the emergency use of tixagevimab/cilgavimab for the pre-exposure prophylaxis of COVID-19 in patients with moderate-severe immunocompromised status, who meet above EUA criteria.  2. The significant known and potential risks and benefits of COVID monoclonal antibody, and the extent to which such potential risks and benefits are unknown.  3. Information on available alternative treatments and the risks and benefits of those alternatives, including clinical trials.  4. The patient or  parent/caregiver has the option to accept or refuse COVID monoclonal antibody treatment.  After reviewing this information with the patient, agree to receive tixagevimab/cilgavimab. High priority schedule message sent.   Scot Dock, NP, 02/28/2021, 2:40 PM

## 2021-02-28 NOTE — Telephone Encounter (Signed)
Scheduled appts per 5/23 sch msg. Pt aware.

## 2021-03-04 ENCOUNTER — Other Ambulatory Visit: Payer: Self-pay

## 2021-03-04 DIAGNOSIS — C9110 Chronic lymphocytic leukemia of B-cell type not having achieved remission: Secondary | ICD-10-CM

## 2021-03-08 ENCOUNTER — Other Ambulatory Visit: Payer: Self-pay

## 2021-03-08 ENCOUNTER — Inpatient Hospital Stay: Payer: 59

## 2021-03-08 DIAGNOSIS — C9110 Chronic lymphocytic leukemia of B-cell type not having achieved remission: Secondary | ICD-10-CM

## 2021-03-08 LAB — CMP (CANCER CENTER ONLY)
ALT: 22 U/L (ref 0–44)
AST: 24 U/L (ref 15–41)
Albumin: 4.4 g/dL (ref 3.5–5.0)
Alkaline Phosphatase: 42 U/L (ref 38–126)
Anion gap: 14 (ref 5–15)
BUN: 18 mg/dL (ref 8–23)
CO2: 27 mmol/L (ref 22–32)
Calcium: 9.7 mg/dL (ref 8.9–10.3)
Chloride: 100 mmol/L (ref 98–111)
Creatinine: 0.7 mg/dL (ref 0.44–1.00)
GFR, Estimated: >60 mL/min (ref >60)
Glucose, Bld: 108 mg/dL — ABNORMAL HIGH (ref 70–99)
Potassium: 3.6 mmol/L (ref 3.5–5.1)
Sodium: 141 mmol/L (ref 135–145)
Total Bilirubin: 0.8 mg/dL (ref 0.3–1.2)
Total Protein: 6.7 g/dL (ref 6.5–8.1)

## 2021-03-08 LAB — CBC WITH DIFFERENTIAL (CANCER CENTER ONLY)
Abs Immature Granulocytes: 0.01 10*3/uL (ref 0.00–0.07)
Basophils Absolute: 0 10*3/uL (ref 0.0–0.1)
Basophils Relative: 1 %
Eosinophils Absolute: 0 10*3/uL (ref 0.0–0.5)
Eosinophils Relative: 0 %
HCT: 40.9 % (ref 36.0–46.0)
Hemoglobin: 13.9 g/dL (ref 12.0–15.0)
Immature Granulocytes: 0 %
Lymphocytes Relative: 34 %
Lymphs Abs: 1.4 10*3/uL (ref 0.7–4.0)
MCH: 30.2 pg (ref 26.0–34.0)
MCHC: 34 g/dL (ref 30.0–36.0)
MCV: 88.9 fL (ref 80.0–100.0)
Monocytes Absolute: 0.5 10*3/uL (ref 0.1–1.0)
Monocytes Relative: 12 %
Neutro Abs: 2.2 10*3/uL (ref 1.7–7.7)
Neutrophils Relative %: 53 %
Platelet Count: 176 10*3/uL (ref 150–400)
RBC: 4.6 MIL/uL (ref 3.87–5.11)
RDW: 13.7 % (ref 11.5–15.5)
WBC Count: 4.1 10*3/uL (ref 4.0–10.5)
nRBC: 0 % (ref 0.0–0.2)

## 2021-03-08 LAB — LACTATE DEHYDROGENASE: LDH: 204 U/L — ABNORMAL HIGH (ref 98–192)

## 2021-03-08 MED ORDER — CILGAVIMAB (PART OF EVUSHELD) INJECTION
300.0000 mg | Freq: Once | INTRAMUSCULAR | Status: AC
Start: 2021-03-08 — End: 2021-03-08
  Administered 2021-03-08: 300 mg via INTRAMUSCULAR
  Filled 2021-03-08: qty 3

## 2021-03-08 MED ORDER — TIXAGEVIMAB (PART OF EVUSHELD) INJECTION
300.0000 mg | Freq: Once | INTRAMUSCULAR | Status: AC
  Administered 2021-03-08: 300 mg via INTRAMUSCULAR
  Filled 2021-03-08: qty 3

## 2021-03-08 NOTE — Progress Notes (Signed)
Patient observed 1 hour post evusheld injection. Discharged in stable condition with no complaints. Information sheet given.

## 2021-03-09 ENCOUNTER — Other Ambulatory Visit: Payer: Self-pay | Admitting: Hematology

## 2021-03-09 ENCOUNTER — Ambulatory Visit: Payer: 59 | Admitting: Rheumatology

## 2021-03-09 DIAGNOSIS — C9110 Chronic lymphocytic leukemia of B-cell type not having achieved remission: Secondary | ICD-10-CM

## 2021-03-14 DIAGNOSIS — M7742 Metatarsalgia, left foot: Secondary | ICD-10-CM | POA: Insufficient documentation

## 2021-03-14 DIAGNOSIS — M21619 Bunion of unspecified foot: Secondary | ICD-10-CM | POA: Insufficient documentation

## 2021-03-17 ENCOUNTER — Encounter: Payer: Self-pay | Admitting: Hematology

## 2021-04-24 NOTE — Progress Notes (Signed)
HEMATOLOGY/ONCOLOGY CLINIC NOTE  Date of Service: 04/25/2021   Patient Care Team: Carol Ada, MD as PCP - General (Family Medicine) Troy Sine, MD as PCP - Cardiology (Cardiology)  CHIEF COMPLAINTS/PURPOSE OF CONSULTATION:  CLL  HISTORY OF PRESENTING ILLNESS:  Colleen Shannon is a wonderful 65 y.o. female who has been referred to [place] by Dr. Lindi Adie for evaluation and management of CLL. The pt reports that she is doing well overall.   The pt reports that she was being seen by Dr. Lindi Adie for Lobular Carcinoma in situ (LCIS) of the left breast. She had a lumpectomy and recently completed 5 years of Tamoxifen for breast cancer prevention. Pt was having frequent respiratory infections 3-4 years ago and after some labwork was found to have CLL. Dr. Lindi Adie has been monitoring this and pt has never required treatment. She has not had as many issues with recurrent infections in the last 2-3 years. She has had two SCC lesions found and removed. Her last was found 8-10 years ago.   She has felt well over the last 6-12 months. Pt has had lymphadenopathy over the years that often swells and shrinks rapidly. About two weeks ago she began to notice bilateral neck swelling. It was uncomfortable at times and affected her breathing. Her neck swelling is starting to settle down and is no longer causing her discomfort. She received her last vaccine ([place]) in March and has not had any infections recently. Pt has joint pain for which she saw Dr. Estanislado Pandy who did not think that the pt has RA.   01/31/2016 Peripheral Blood Flow Cytometry Report revealed "Chronic Lymphocytic Leukemia."  Most recent lab results (12/01/2019) of CBC is as follows: all values are WNL except for WBC at 43.4K, PLT at 145K, Lymphs Abs at 37.2K, Mono Abs at 3.0K, Baso Abs at 0.2K, Glucose at 135, Total Protein at 6.1, ALP at 37. 12/01/2019 LDH at 285  On review of systems, pt reports joint pain, improving lumps and denies  fevers, chills, sweats, fatigue, unexpected weight loss, abdominal pain and any other symptoms.   On PMHx the pt reports Lobular Carcinoma in situ, CLL, Lumpectomy, Arthritis, Pneumonia, SCC. 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  INTERVAL HISTORY:  Colleen Shannon is a wonderful 65 y.o. female who is here for evaluation and management of CLL. The patient's last visit with [place] was on 02/22/2021. The pt reports that she is doing well overall.  The pt reports no new symptoms or concerns. She notes that she recently had COVID again the first of June. This was one week after receiving the [place], around 4-5 days after from her brother. This was very mild and the pt has since completely recovered.   The patient notes that she is still desiring to have ankle surgery around the end of this year.  Lab results today 04/25/2021 of CBC w/diff and CMP is as follows: all values are WNL except for WBC of 3.3K. CMP pending. 04/25/2021 LDH . Lab Results  Component Value Date   LDH 188 04/25/2021    On review of systems, pt reports continued hair loss, muscle stiffness and denies fevers, chills, night sweats, SOB, chest pain, new lumps/bumps, leg swelling, decreased appetite, and any other symptoms.  MEDICAL HISTORY:  Past Medical History:  Diagnosis Date   Arthritis    Breast CA ([place]) dx'd 2015   left   Bronchiectasis ([place])    Cancer ([place])  hx of skin cancer    CLL (chronic lymphocytic leukemia) ([place]) dx'd 2015   Dyspnea    Dysrhythmia    PVCs   Elevated cholesterol 05/22/2012   Nuc stress test-Normal.   Endometrial polyp    GERD (gastroesophageal reflux disease)    Grade I diastolic dysfunction    noted on echo   History of palpitations    Hypertension 05/22/12   ECHO-EF >55% trace tricupsid regurgitation. There is mild mitral regurgitation.    MR (mitral regurgitation)    mild noted on echo   Plantar fasciitis of left foot    resolved   Pneumonia     history of x 2   PONV (postoperative nausea and vomiting)     SURGICAL HISTORY: Past Surgical History:  Procedure Laterality Date   ABDOMINAL SURGERY     Abdominoplasty   BRONCHIAL BIOPSY  06/29/2020   Procedure: BRONCHIAL BIOPSIES;  Surgeon: Collene Gobble, MD;  Location: [facility];  Service: Cardiopulmonary;;   BRONCHIAL BRUSHINGS  06/29/2020   Procedure: BRONCHIAL BRUSHINGS;  Surgeon: Collene Gobble, MD;  Location: [facility];  Service: Cardiopulmonary;;   BRONCHIAL WASHINGS  06/29/2020   Procedure: BRONCHIAL WASHINGS;  Surgeon: Collene Gobble, MD;  Location: [facility];  Service: Cardiopulmonary;;   CESAREAN SECTION     X 2   CHOLECYSTECTOMY     COLONSCOPY     DILATION AND CURETTAGE OF UTERUS     FOOT SURGERY Left    HYSTEROSCOPY     KNEE ARTHROTOMY Right 06/04/2019   Procedure: Right knee arthrotomy; scar excision;  Surgeon: [name], MD;  Location: [facility];  Service: Orthopedics;  Laterality: Right;  60mn   KNEE CLOSED REDUCTION Right 07/20/2014   Procedure: RIGHT KNEE CLOSED MANIPULATION;  Surgeon: FGearlean Alf MD;  Location: [facility];  Service: Orthopedics;  Laterality: Right;   KNEE SURGERY Right    arthroscopic   PARTIAL KNEE ARTHROPLASTY Left 2017   RADIOACTIVE SEED GUIDED EXCISIONAL BREAST BIOPSY N/A 10/06/2014   Procedure: RADIOACTIVE SEED GUIDED EXCISIONAL BREAST BIOPSY;  Surgeon: MRolm Bookbinder MD;  Location[place] [facility]  Service: General;  Laterality: N/A;   REFRACTIVE SURGERY     TOTAL KNEE ARTHROPLASTY Right 05/11/2014   Procedure: RIGHT TOTAL KNEE ARTHROPLASTY;  Surgeon: FGearlean Alf MD;  Location: [facility];  Service: Orthopedics;  Laterality: Right;   TUBAL LIGATION     VIDEO BRONCHOSCOPY N/A 06/29/2020   Procedure: VIDEO BRONCHOSCOPY WITHOUT FLUORO;  Surgeon: BCollene Gobble MD;  [facility];  Service: Cardiopulmonary;  Laterality: N/A;  with BAL    SOCIAL HISTORY: Social History   Socioeconomic History    Marital status: Married    Spouse name: Not on file   Number of children: Not on file   Years of education: Not on file   Highest education level: Not on file  Occupational History   Not on file  Tobacco Use   Smoking status: Never   Smokeless tobacco: Never  Vaping Use   Vaping Use: Never used  Substance and Sexual Activity   Alcohol use: Yes    Comment: occasional    Drug use: No   Sexual activity: Yes    Birth control/protection: Surgical, Post-menopausal    Comment: Hysterectomy  Other Topics Concern   Not on file  Social History Narrative   Not on file   Social Determinants of Health   Financial Resource Strain: Not on file  Food Insecurity: Not on file  Transportation Needs: Not on file  Physical Activity: Not on file  Stress: Not on file  Social Connections: Not on file  Intimate Partner Violence: Not on file    FAMILY HISTORY: Family History  Problem Relation Age of Onset   Heart disease Father    Dementia Father    Aneurysm Mother    Hyperlipidemia Mother    Alzheimer's disease Mother    Osteoarthritis Mother    Hypertension Brother    High Cholesterol Brother    Cancer - Other Paternal Grandmother        uterine   Hypertension Paternal Grandfather    Stroke Paternal Grandfather    Heart disease Maternal Grandfather    Cancer - Other Maternal Grandfather        lung   Cushing syndrome Brother    Healthy Son    Healthy Daughter     ALLERGIES:  has No Known Allergies.  MEDICATIONS:  Current Outpatient Medications  Medication Sig Dispense Refill   albuterol (VENTOLIN HFA) 108 (90 Base) MCG/ACT inhaler Inhale 1-2 puffs into the lungs every 6 (six) hours as needed for wheezing or shortness of breath.     amLODipine (NORVASC) 5 MG tablet TAKE 1 TABLET BY MOUTH AT  BEDTIME 90 tablet 3   amoxicillin (AMOXIL) 500 MG capsule Take 1,000 mg by mouth 2 (two) times daily.     atorvastatin (LIPITOR) 20 MG tablet Take 1 tablet (20 mg total) by mouth at  bedtime.     B Complex Vitamins (B COMPLEX 1 PO)      Calcium Carb-Cholecalciferol (CALCIUM-VITAMIN D3) 600-400 MG-UNIT TABS Take 1 tablet by mouth 2 (two) times daily.     carvedilol (COREG) 6.25 MG tablet Take 1 tablet (6.25 mg total) by mouth 2 (two) times daily with a meal. 180 tablet 1   cholecalciferol (VITAMIN D3) 25 MCG (1000 UNIT) tablet Take 1,000 Units by mouth in the morning and at bedtime.     Cyanocobalamin (B-12) 2500 MCG TABS Take 2,500 mcg by mouth daily.     dexamethasone (DECADRON) 4 MG tablet Take 2 tabs (33m) the evening before and in the morning for 3 days after each treatment with Gazyva 30 tablet 4   ezetimibe (ZETIA) 10 MG tablet Take 1 tablet (10 mg total) by mouth at bedtime. 30 tablet 0   ketoconazole (NIZORAL) 2 % cream Apply 1 application topically daily.     Multiple Vitamin (MULTIVITAMIN WITH MINERALS) TABS tablet Take 1 tablet by mouth daily.     Omega-3 Fatty Acids (FISH OIL) 1200 MG CAPS Take 1,200 mg by mouth in the morning and at bedtime. 120 mg-180 mg     Omeprazole 20 MG TBEC Take 20 mg by mouth in the morning.      ondansetron (ZOFRAN) 8 MG tablet Take 1 tablet (8 mg total) by mouth every 8 (eight) hours as needed for nausea. 30 tablet 3   Turmeric 500 MG TABS Take 1,000 mg by mouth daily.      VENCLEXTA 100 MG tablet TAKE 2 TABLETS BY MOUTH  ONCE DAILY WITH FOOD AND A  FULL GLASS OF WATER 60 tablet 1   No current facility-administered medications for this visit.    REVIEW OF SYSTEMS:   10 Point review of Systems was done is negative except as noted above.  PHYSICAL EXAMINATION: ECOG PERFORMANCE STATUS: 0 - Asymptomatic .BP 130/68 (BP Location: Left Arm, Patient Position: Sitting)   Pulse 71   Temp 98.2 F (36.8 C) (  Oral)   Resp 16   Ht _0  (1.549 m)   Wt 144 lb 8 oz (65.5 kg)   LMP 11/19/2011 (LMP Unknown)   BMI 27.30 kg/m  .BP 130/68 (BP Location: Left Arm, Patient Position: Sitting)   Pulse 71   Temp 98.2 F (36.8 C) (Oral)   Resp 16    Ht _1  (1.549 m)   Wt 144 lb 8 oz (65.5 kg)   LMP 11/19/2011 (LMP Unknown)   BMI 27.30 kg/m  .BP 130/68 (BP Location: Left Arm, Patient Position: Sitting)   Pulse 71   Temp 98.2 F (36.8 C) (Oral)   Resp 16   Ht _2  (1.549 m)   Wt 144 lb 8 oz (65.5 kg)   LMP 11/19/2011 (LMP Unknown)   BMI 27.30 kg/m    NAD. GENERAL:alert, in no acute distress and comfortable SKIN: no acute rashes, no significant lesions EYES: conjunctiva are pink and non-injected, sclera anicteric OROPHARYNX: MMM, no exudates, no oropharyngeal erythema or ulceration NECK: supple, no JVD LYMPH:  no palpable lymphadenopathy in the cervical, axillary or inguinal regions LUNGS: clear to auscultation b/l with normal respiratory effort HEART: regular rate & rhythm ABDOMEN:  normoactive bowel sounds , non tender, not distended. Extremity: no pedal edema PSYCH: alert & oriented x 3 with fluent speech NEURO: no focal motor/sensory deficits   LABORATORY DATA:  I have reviewed the data as listed  . CBC Latest Ref Rng & Units 04/25/2021 03/08/2021 02/22/2021  WBC 4.0 - 10.5 K/uL 3.3(L) 4.1 3.7(L)  Hemoglobin 12.0 - 15.0 g/dL 14.1 13.9 14.4  Hematocrit 36.0 - 46.0 % 40.3 40.9 42.3  Platelets 150 - 400 K/uL 188 176 224    . CMP Latest Ref Rng & Units 03/08/2021 02/22/2021 02/17/2021  Glucose 70 - 99 mg/dL 108(H) 107(H) -  BUN 8 - 23 mg/dL 18 24(H) -  Creatinine 0.44 - 1.00 mg/dL 0.70 0.73 0.60  Sodium 135 - 145 mmol/L 141 144 -  Potassium 3.5 - 5.1 mmol/L 3.6 4.5 -  Chloride 98 - 111 mmol/L 100 102 -  CO2 22 - 32 mmol/L 27 31 -  Calcium 8.9 - 10.3 mg/dL 9.7 10.1 -  Total Protein 6.5 - 8.1 g/dL 6.7 6.8 -  Total Bilirubin 0.3 - 1.2 mg/dL 0.8 0.5 -  Alkaline Phos 38 - 126 U/L 42 48 -  AST 15 - 41 U/L 24 22 -  ALT 0 - 44 U/L 22 25 -      03/03/2020 FISH (CLL Prognosis) Panel:     RADIOGRAPHIC STUDIES: I have personally reviewed the radiological images as listed and agreed with the findings in the  report. No results found.   ASSESSMENT & PLAN:   65 yo with   1) Rai Stage 1 Chronic lymphocytic Leukemia. Found to have trisomy 12 and 13q deletion via 03/03/2020 FISH (CLL Prognosis) Panel  2) Fever 3) Abnormal LFTs 4) recent COVID 19 infection -- resolved.  PLAN: -Discussed pt labwork today, 04/25/2021; blood counts stable, Hgb and plt normal, other labs pending. -Recommended pt receive her second booster around 6-8 weeks after her active COVID infection. This would be around the start of August. -Advised pt that one year of Venetoclax will be the end of this year (around December). -Advised pt that she would need to be off Venetoclax roughly one month prior to surgery to reduce risks of infection. We would stop in November if she desires to get it done in December. -Discussed repeat imaging.  Advised pt we would get this one month after completing Venetoclax. -The pt has no prohibitive toxicities from continuing 200 mg Venetoclax daily. -Recommend pt continue to drink 48-64 oz water daily.  -Will see back in 2 months with labs.   2) Neoplasm of left breast, primary tumor staging category Tis: lobular carcinoma in situ (LCIS) Left breast LCIS status post lumpectomy 10/06/2014; Started tamoxifen for breast cancer risk reduction 20 mg daily 11/18/2014 , decrease to 10 mg on 11/22/2017 completed February 2021.   Breast cancer surveillance:  Mammogram and ultrasound 05/20/2019 at [facility]: Benign breast density category B Breast exam 02/23/2020: Benign Plan: -continue breast self examinations -MMG Q12 months    FOLLOW UP: RTC with Dr Irene Limbo with labs in 2 months    The total time spent in the appointment was 20 minutes and more than 50% was on counseling and direct patient cares.    All of the patient's questions were answered with apparent satisfaction. The patient knows to call the clinic with any problems, questions or concerns.    Sullivan Lone MD [place] AAHIVMS [facility]  [facility] Hematology/Oncology Physician [facility]  (Office):       [phone] (Work cell):  [phone] (Fax):           [phone]  04/25/2021 9:37 AM  I, Reinaldo Raddle, am acting as scribe for Dr. Sullivan Lone, MD.   .I have reviewed the above documentation for accuracy and completeness, and I agree with the above.   Brunetta Genera MD

## 2021-04-25 ENCOUNTER — Inpatient Hospital Stay: Payer: 59 | Attending: Hematology and Oncology | Admitting: Hematology

## 2021-04-25 ENCOUNTER — Other Ambulatory Visit: Payer: Self-pay

## 2021-04-25 ENCOUNTER — Inpatient Hospital Stay: Payer: 59

## 2021-04-25 VITALS — BP 130/68 | HR 71 | Temp 98.2°F | Resp 16 | Ht 61.0 in | Wt 144.5 lb

## 2021-04-25 DIAGNOSIS — Z853 Personal history of malignant neoplasm of breast: Secondary | ICD-10-CM | POA: Diagnosis not present

## 2021-04-25 DIAGNOSIS — R509 Fever, unspecified: Secondary | ICD-10-CM | POA: Diagnosis not present

## 2021-04-25 DIAGNOSIS — C9110 Chronic lymphocytic leukemia of B-cell type not having achieved remission: Secondary | ICD-10-CM

## 2021-04-25 LAB — CMP (CANCER CENTER ONLY)
ALT: 26 U/L (ref 0–44)
AST: 23 U/L (ref 15–41)
Albumin: 4.2 g/dL (ref 3.5–5.0)
Alkaline Phosphatase: 47 U/L (ref 38–126)
Anion gap: 9 (ref 5–15)
BUN: 20 mg/dL (ref 8–23)
CO2: 30 mmol/L (ref 22–32)
Calcium: 10 mg/dL (ref 8.9–10.3)
Chloride: 100 mmol/L (ref 98–111)
Creatinine: 0.72 mg/dL (ref 0.44–1.00)
GFR, Estimated: >60 mL/min (ref >60)
Glucose, Bld: 101 mg/dL — ABNORMAL HIGH (ref 70–99)
Potassium: 4.5 mmol/L (ref 3.5–5.1)
Sodium: 139 mmol/L (ref 135–145)
Total Bilirubin: 0.8 mg/dL (ref 0.3–1.2)
Total Protein: 6.7 g/dL (ref 6.5–8.1)

## 2021-04-25 LAB — CBC WITH DIFFERENTIAL (CANCER CENTER ONLY)
Abs Immature Granulocytes: 0 10*3/uL (ref 0.00–0.07)
Basophils Absolute: 0 10*3/uL (ref 0.0–0.1)
Basophils Relative: 1 %
Eosinophils Absolute: 0 10*3/uL (ref 0.0–0.5)
Eosinophils Relative: 0 %
HCT: 40.3 % (ref 36.0–46.0)
Hemoglobin: 14.1 g/dL (ref 12.0–15.0)
Immature Granulocytes: 0 %
Lymphocytes Relative: 29 %
Lymphs Abs: 1 10*3/uL (ref 0.7–4.0)
MCH: 30.9 pg (ref 26.0–34.0)
MCHC: 35 g/dL (ref 30.0–36.0)
MCV: 88.4 fL (ref 80.0–100.0)
Monocytes Absolute: 0.5 10*3/uL (ref 0.1–1.0)
Monocytes Relative: 14 %
Neutro Abs: 1.9 10*3/uL (ref 1.7–7.7)
Neutrophils Relative %: 56 %
Platelet Count: 188 10*3/uL (ref 150–400)
RBC: 4.56 MIL/uL (ref 3.87–5.11)
RDW: 13.5 % (ref 11.5–15.5)
WBC Count: 3.3 10*3/uL — ABNORMAL LOW (ref 4.0–10.5)
nRBC: 0 % (ref 0.0–0.2)

## 2021-04-25 LAB — LACTATE DEHYDROGENASE: LDH: 188 U/L (ref 98–192)

## 2021-04-26 ENCOUNTER — Encounter: Payer: Self-pay | Admitting: Hematology

## 2021-05-01 ENCOUNTER — Encounter: Payer: Self-pay | Admitting: Hematology

## 2021-05-31 ENCOUNTER — Ambulatory Visit: Payer: 59 | Admitting: Cardiovascular Disease

## 2021-06-04 ENCOUNTER — Other Ambulatory Visit: Payer: Self-pay | Admitting: Hematology

## 2021-06-04 ENCOUNTER — Encounter: Payer: Self-pay | Admitting: Hematology

## 2021-06-04 DIAGNOSIS — C9110 Chronic lymphocytic leukemia of B-cell type not having achieved remission: Secondary | ICD-10-CM

## 2021-06-06 ENCOUNTER — Encounter: Payer: Self-pay | Admitting: Hematology

## 2021-06-28 ENCOUNTER — Other Ambulatory Visit: Payer: Self-pay

## 2021-06-28 ENCOUNTER — Inpatient Hospital Stay: Payer: 59 | Attending: Hematology and Oncology

## 2021-06-28 ENCOUNTER — Inpatient Hospital Stay (HOSPITAL_BASED_OUTPATIENT_CLINIC_OR_DEPARTMENT_OTHER): Payer: 59 | Admitting: Hematology

## 2021-06-28 VITALS — BP 132/68 | HR 85 | Temp 98.9°F | Resp 16 | Ht 61.0 in | Wt 148.1 lb

## 2021-06-28 DIAGNOSIS — Z853 Personal history of malignant neoplasm of breast: Secondary | ICD-10-CM | POA: Diagnosis not present

## 2021-06-28 DIAGNOSIS — R509 Fever, unspecified: Secondary | ICD-10-CM | POA: Insufficient documentation

## 2021-06-28 DIAGNOSIS — Z79899 Other long term (current) drug therapy: Secondary | ICD-10-CM | POA: Diagnosis not present

## 2021-06-28 DIAGNOSIS — R7989 Other specified abnormal findings of blood chemistry: Secondary | ICD-10-CM | POA: Insufficient documentation

## 2021-06-28 DIAGNOSIS — C9110 Chronic lymphocytic leukemia of B-cell type not having achieved remission: Secondary | ICD-10-CM

## 2021-06-28 DIAGNOSIS — Z8616 Personal history of COVID-19: Secondary | ICD-10-CM | POA: Diagnosis not present

## 2021-06-28 DIAGNOSIS — I10 Essential (primary) hypertension: Secondary | ICD-10-CM | POA: Insufficient documentation

## 2021-06-28 LAB — CBC WITH DIFFERENTIAL (CANCER CENTER ONLY)
Abs Immature Granulocytes: 0.01 10*3/uL (ref 0.00–0.07)
Basophils Absolute: 0 10*3/uL (ref 0.0–0.1)
Basophils Relative: 0 %
Eosinophils Absolute: 0 10*3/uL (ref 0.0–0.5)
Eosinophils Relative: 0 %
HCT: 38 % (ref 36.0–46.0)
Hemoglobin: 13.1 g/dL (ref 12.0–15.0)
Immature Granulocytes: 0 %
Lymphocytes Relative: 22 %
Lymphs Abs: 1.2 10*3/uL (ref 0.7–4.0)
MCH: 31.3 pg (ref 26.0–34.0)
MCHC: 34.5 g/dL (ref 30.0–36.0)
MCV: 90.9 fL (ref 80.0–100.0)
Monocytes Absolute: 0.9 10*3/uL (ref 0.1–1.0)
Monocytes Relative: 16 %
Neutro Abs: 3.3 10*3/uL (ref 1.7–7.7)
Neutrophils Relative %: 62 %
Platelet Count: 172 10*3/uL (ref 150–400)
RBC: 4.18 MIL/uL (ref 3.87–5.11)
RDW: 13.6 % (ref 11.5–15.5)
WBC Count: 5.4 10*3/uL (ref 4.0–10.5)
nRBC: 0 % (ref 0.0–0.2)

## 2021-06-28 LAB — CMP (CANCER CENTER ONLY)
ALT: 23 U/L (ref 0–44)
AST: 21 U/L (ref 15–41)
Albumin: 4.3 g/dL (ref 3.5–5.0)
Alkaline Phosphatase: 50 U/L (ref 38–126)
Anion gap: 9 (ref 5–15)
BUN: 8 mg/dL (ref 8–23)
CO2: 29 mmol/L (ref 22–32)
Calcium: 9.9 mg/dL (ref 8.9–10.3)
Chloride: 104 mmol/L (ref 98–111)
Creatinine: 0.86 mg/dL (ref 0.44–1.00)
GFR, Estimated: >60 mL/min (ref >60)
Glucose, Bld: 105 mg/dL — ABNORMAL HIGH (ref 70–99)
Potassium: 3.8 mmol/L (ref 3.5–5.1)
Sodium: 142 mmol/L (ref 135–145)
Total Bilirubin: 0.8 mg/dL (ref 0.3–1.2)
Total Protein: 6.7 g/dL (ref 6.5–8.1)

## 2021-06-28 LAB — LACTATE DEHYDROGENASE: LDH: 218 U/L — ABNORMAL HIGH (ref 98–192)

## 2021-06-29 ENCOUNTER — Telehealth: Payer: Self-pay | Admitting: Hematology

## 2021-06-29 NOTE — Telephone Encounter (Signed)
Scheduled follow-up appointment per 9/20 los. Patient is aware. 

## 2021-07-01 ENCOUNTER — Other Ambulatory Visit: Payer: Self-pay

## 2021-07-01 DIAGNOSIS — Z79899 Other long term (current) drug therapy: Secondary | ICD-10-CM

## 2021-07-01 DIAGNOSIS — I10 Essential (primary) hypertension: Secondary | ICD-10-CM

## 2021-07-01 MED ORDER — ATORVASTATIN CALCIUM 20 MG PO TABS: 20.0000 mg | ORAL_TABLET | Freq: Every day | ORAL | 3 refills | Status: DC

## 2021-07-04 NOTE — Progress Notes (Signed)
HEMATOLOGY/ONCOLOGY CLINIC NOTE  Date of Service: 07/04/2021   Patient Care Team: Carol Ada, MD as PCP - General (Family Medicine) Troy Sine, MD as PCP - Cardiology (Cardiology)  CHIEF COMPLAINTS/PURPOSE OF CONSULTATION:  CLL  HISTORY OF PRESENTING ILLNESS:  Colleen Shannon is a wonderful 65 y.o. female who has been referred to [place] by Dr. Lindi Adie for evaluation and management of CLL. The pt reports that she is doing well overall.   The pt reports that she was being seen by Dr. Lindi Adie for Lobular Carcinoma in situ (LCIS) of the left breast. She had a lumpectomy and recently completed 5 years of Tamoxifen for breast cancer prevention. Pt was having frequent respiratory infections 3-4 years ago and after some labwork was found to have CLL. Dr. Lindi Adie has been monitoring this and pt has never required treatment. She has not had as many issues with recurrent infections in the last 2-3 years. She has had two SCC lesions found and removed. Her last was found 8-10 years ago.   She has felt well over the last 6-12 months. Pt has had lymphadenopathy over the years that often swells and shrinks rapidly. About two weeks ago she began to notice bilateral neck swelling. It was uncomfortable at times and affected her breathing. Her neck swelling is starting to settle down and is no longer causing her discomfort. She received her last vaccine ([place]) in March and has not had any infections recently. Pt has joint pain for which she saw Dr. Estanislado Pandy who did not think that the pt has RA.   01/31/2016 Peripheral Blood Flow Cytometry Report revealed "Chronic Lymphocytic Leukemia."  Most recent lab results (12/01/2019) of CBC is as follows: all values are WNL except for WBC at 43.4K, PLT at 145K, Lymphs Abs at 37.2K, Mono Abs at 3.0K, Baso Abs at 0.2K, Glucose at 135, Total Protein at 6.1, ALP at 37. 12/01/2019 LDH at 285  On review of systems, pt reports joint pain, improving lumps and denies  fevers, chills, sweats, fatigue, unexpected weight loss, abdominal pain and any other symptoms.   On PMHx the pt reports Lobular Carcinoma in situ, CLL, Lumpectomy, Arthritis, Pneumonia, SCC. 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  INTERVAL HISTORY:  Colleen Shannon is a wonderful 65 y.o. female who is here for evaluation and management of CLL. The patient's last visit with [place] was on 04/25/2021. The pt reports that she is doing well overall.  The pt reports no new symptoms or concerns. She notes that she had COVID again- mild symptoms the first of June. This was one week after receiving the [place], around 4-5 days after from her brother. This was very mild and the pt has since completely recovered.   The patient notes that she is still desiring to have ankle surgery around the end of this year.  Lab results today 04/25/2021 of CBC w/diff and CMP is as follows: all values are WNL except for WBC of 3.3K. CMP pending. 04/25/2021 LDH . Lab Results  Component Value Date   LDH 218 (H) 06/28/2021    On review of systems, pt reports continued hair loss, muscle stiffness and denies fevers, chills, night sweats, SOB, chest pain, new lumps/bumps, leg swelling, decreased appetite, and any other symptoms.  MEDICAL HISTORY:  Past Medical History:  Diagnosis Date   Arthritis    Breast CA ([place]) dx'd 2015   left   Bronchiectasis ([place])    Cancer (  [place])    hx of skin cancer    CLL (chronic lymphocytic leukemia) ([place]) dx'd 2015   Dyspnea    Dysrhythmia    PVCs   Elevated cholesterol 05/22/2012   Nuc stress test-Normal.   Endometrial polyp    GERD (gastroesophageal reflux disease)    Grade I diastolic dysfunction    noted on echo   History of palpitations    Hypertension 05/22/12   ECHO-EF >55% trace tricupsid regurgitation. There is mild mitral regurgitation.    MR (mitral regurgitation)    mild noted on echo   Plantar fasciitis of left foot    resolved    Pneumonia    history of x 2   PONV (postoperative nausea and vomiting)     SURGICAL HISTORY: Past Surgical History:  Procedure Laterality Date   ABDOMINAL SURGERY     Abdominoplasty   BRONCHIAL BIOPSY  06/29/2020   Procedure: BRONCHIAL BIOPSIES;  Surgeon: Collene Gobble, MD;  Location: [place];  Service: Cardiopulmonary;;   BRONCHIAL BRUSHINGS  06/29/2020   Procedure: BRONCHIAL BRUSHINGS;  Surgeon: Collene Gobble, MD;  Location: [place];  Service: Cardiopulmonary;;   BRONCHIAL WASHINGS  06/29/2020   Procedure: BRONCHIAL WASHINGS;  Surgeon: Collene Gobble, MD;  [facility];  Service: Cardiopulmonary;;   CESAREAN SECTION     X 2   CHOLECYSTECTOMY     COLONSCOPY     DILATION AND CURETTAGE OF UTERUS     FOOT SURGERY Left    HYSTEROSCOPY     KNEE ARTHROTOMY Right 06/04/2019   Procedure: Right knee arthrotomy; scar excision;  Surgeon: [name], MD;  [facility];  Service: Orthopedics;  Laterality: Right;  33mn   KNEE CLOSED REDUCTION Right 07/20/2014   Procedure: RIGHT KNEE CLOSED MANIPULATION;  Surgeon: FGearlean Alf MD;  [facility];  Service: Orthopedics;  Laterality: Right;   KNEE SURGERY Right    arthroscopic   PARTIAL KNEE ARTHROPLASTY Left 2017   RADIOACTIVE SEED GUIDED EXCISIONAL BREAST BIOPSY N/A 10/06/2014   Procedure: RADIOACTIVE SEED GUIDED EXCISIONAL BREAST BIOPSY;  Surgeon: MRolm Bookbinder MD;  Location[place]  Service: General;  Laterality: N/A;   REFRACTIVE SURGERY     TOTAL KNEE ARTHROPLASTY Right 05/11/2014   Procedure: RIGHT TOTAL KNEE ARTHROPLASTY;  Surgeon: FGearlean Alf MD;  [facility];  Service: Orthopedics;  Laterality: Right;   TUBAL LIGATION     VIDEO BRONCHOSCOPY N/A 06/29/2020   Procedure: VIDEO BRONCHOSCOPY WITHOUT FLUORO;  Surgeon: BCollene Gobble MD;  [facility];  Service: Cardiopulmonary;  Laterality: N/A;  with BAL    SOCIAL HISTORY: Social History   Socioeconomic  History   Marital status: Married    Spouse name: Not on file   Number of children: Not on file   Years of education: Not on file   Highest education level: Not on file  Occupational History   Not on file  Tobacco Use   Smoking status: Never   Smokeless tobacco: Never  Vaping Use   Vaping Use: Never used  Substance and Sexual Activity   Alcohol use: Yes    Comment: occasional    Drug use: No   Sexual activity: Yes    Birth control/protection: Surgical, Post-menopausal    Comment: Hysterectomy  Other Topics Concern   Not on file  Social History Narrative   Not on file   Social Determinants of Health   Financial Resource Strain: Not on file  Food Insecurity:  Not on file  Transportation Needs: Not on file  Physical Activity: Not on file  Stress: Not on file  Social Connections: Not on file  Intimate Partner Violence: Not on file    FAMILY HISTORY: Family History  Problem Relation Age of Onset   Heart disease Father    Dementia Father    Aneurysm Mother    Hyperlipidemia Mother    Alzheimer's disease Mother    Osteoarthritis Mother    Hypertension Brother    High Cholesterol Brother    Cancer - Other Paternal Grandmother        uterine   Hypertension Paternal Grandfather    Stroke Paternal Grandfather    Heart disease Maternal Grandfather    Cancer - Other Maternal Grandfather        lung   Cushing syndrome Brother    Healthy Son    Healthy Daughter     ALLERGIES:  has No Known Allergies.  MEDICATIONS:  Current Outpatient Medications  Medication Sig Dispense Refill   albuterol (VENTOLIN HFA) 108 (90 Base) MCG/ACT inhaler Inhale 1-2 puffs into the lungs every 6 (six) hours as needed for wheezing or shortness of breath.     amLODipine (NORVASC) 5 MG tablet TAKE 1 TABLET BY MOUTH AT  BEDTIME 90 tablet 3   B Complex Vitamins (B COMPLEX 1 PO)      Calcium Carb-Cholecalciferol (CALCIUM-VITAMIN D3) 600-400 MG-UNIT TABS Take 1 tablet by mouth 2 (two) times  daily.     carvedilol (COREG) 6.25 MG tablet Take 1 tablet (6.25 mg total) by mouth 2 (two) times daily with a meal. 180 tablet 1   cholecalciferol (VITAMIN D3) 25 MCG (1000 UNIT) tablet Take 1,000 Units by mouth in the morning and at bedtime.     Cyanocobalamin (B-12) 2500 MCG TABS Take 2,500 mcg by mouth daily.     ezetimibe (ZETIA) 10 MG tablet Take 1 tablet (10 mg total) by mouth at bedtime. 30 tablet 0   Multiple Vitamin (MULTIVITAMIN WITH MINERALS) TABS tablet Take 1 tablet by mouth daily.     Omega-3 Fatty Acids (FISH OIL) 1200 MG CAPS Take 1,200 mg by mouth in the morning and at bedtime. 120 mg-180 mg     ondansetron (ZOFRAN) 8 MG tablet Take 1 tablet (8 mg total) by mouth every 8 (eight) hours as needed for nausea. 30 tablet 3   Turmeric 500 MG TABS Take 1,000 mg by mouth daily.      VENCLEXTA 100 MG tablet TAKE 2 TABLETS BY MOUTH  ONCE DAILY WITH FOOD AND A  FULL GLASS OF WATER 60 tablet 1   amoxicillin (AMOXIL) 500 MG capsule Take 1,000 mg by mouth 2 (two) times daily.     atorvastatin (LIPITOR) 20 MG tablet Take 1 tablet (20 mg total) by mouth at bedtime. 90 tablet 3   dexamethasone (DECADRON) 4 MG tablet Take 2 tabs (15m) the evening before and in the morning for 3 days after each treatment with Gazyva 30 tablet 4   ketoconazole (NIZORAL) 2 % cream Apply 1 application topically daily.     Omeprazole 20 MG TBEC Take 20 mg by mouth in the morning.  (Patient not taking: No sig reported)     No current facility-administered medications for this visit.    REVIEW OF SYSTEMS:   .10 Point review of Systems was done is negative except as noted above.   PHYSICAL EXAMINATION: ECOG PERFORMANCE STATUS: 0 - Asymptomatic .BP 132/68 (BP Location: Left Arm, Patient Position:  Sitting)   Pulse 85   Temp 98.9 F (37.2 C) (Oral)   Resp 16   Ht _0  (1.549 m)   Wt 148 lb 1.6 oz (67.2 kg)   LMP 11/19/2011 (LMP Unknown)   SpO2 98%   BMI 27.98 kg/m   . GENERAL:alert, in no acute distress  and comfortable SKIN: no acute rashes, no significant lesions EYES: conjunctiva are pink and non-injected, sclera anicteric OROPHARYNX: MMM, no exudates, no oropharyngeal erythema or ulceration NECK: supple, no JVD LYMPH:  no palpable lymphadenopathy in the cervical, axillary or inguinal regions LUNGS: clear to auscultation b/l with normal respiratory effort HEART: regular rate & rhythm ABDOMEN:  normoactive bowel sounds , non tender, not distended. Extremity: no pedal edema PSYCH: alert & oriented x 3 with fluent speech NEURO: no focal motor/sensory deficits  LABORATORY DATA:  I have reviewed the data as listed  . CBC Latest Ref Rng & Units 06/28/2021 04/25/2021 03/08/2021  WBC 4.0 - 10.5 K/uL 5.4 3.3(L) 4.1  Hemoglobin 12.0 - 15.0 g/dL 13.1 14.1 13.9  Hematocrit 36.0 - 46.0 % 38.0 40.3 40.9  Platelets 150 - 400 K/uL 172 188 176    . CMP Latest Ref Rng & Units 06/28/2021 04/25/2021 03/08/2021  Glucose 70 - 99 mg/dL 105(H) 101(H) 108(H)  BUN 8 - 23 mg/dL _1 Creatinine 0.44 - 1.00 mg/dL 0.86 0.72 0.70  Sodium 135 - 145 mmol/L 142 139 141  Potassium 3.5 - 5.1 mmol/L 3.8 4.5 3.6  Chloride 98 - 111 mmol/L 104 100 100  CO2 22 - 32 mmol/L _2 Calcium 8.9 - 10.3 mg/dL 9.9 10.0 9.7  Total Protein 6.5 - 8.1 g/dL 6.7 6.7 6.7  Total Bilirubin 0.3 - 1.2 mg/dL 0.8 0.8 0.8  Alkaline Phos 38 - 126 U/L 50 47 42  AST 15 - 41 U/L _3 ALT 0 - 44 U/L _4 . Lab Results  Component Value Date   LDH 218 (H) 06/28/2021      03/03/2020 FISH (CLL Prognosis) Panel:     RADIOGRAPHIC STUDIES: I have personally reviewed the radiological images as listed and agreed with the findings in the report. No results found.   ASSESSMENT & PLAN:   65 yo with   1) Rai Stage 1 Chronic lymphocytic Leukemia. Found to have trisomy 12 and 13q deletion via 03/03/2020 FISH (CLL Prognosis) Panel  2) Fever 3) Abnormal LFTs 4) recent COVID 19 infection --  resolved.  PLAN: -Discussed pt labwork today, 04/25/2021; blood counts stable, Hgb and plt normal, . Lab Results  Component Value Date   LDH 218 (H) 06/28/2021    -Recommended pt receive hernew covid 19 vaccination and flu shot. -labs stable. Advised pt that one year of Venetoclax will be the end of this year -end of Nov 2022 -Advised pt that she would need to be off Venetoclax roughly one month prior to surgery to reduce risks of infection. We would stop in November if she desires to get it done in December. -Discussed repeat imaging. Advised pt we would get this one month after completing Venetoclax. -The pt has no prohibitive toxicities from continuing 200 mg Venetoclax daily. -Recommend pt continue to drink 48-64 oz water daily.  -Will see back in 2 months with labs.   2) Neoplasm of left breast, primary tumor staging category Tis: lobular carcinoma in situ (LCIS) Left breast LCIS status post lumpectomy 10/06/2014; Started tamoxifen for breast cancer risk reduction  20 mg daily 11/18/2014 , decrease to 10 mg on 11/22/2017 completed February 2021.   Breast cancer surveillance:  Mammogram and ultrasound 05/20/2019 at [facility]: Benign breast density category B Breast exam 02/23/2020: Benign Plan: -continue breast self examinations -MMG Q12 months    FOLLOW UP: RTC with Dr Irene Limbo with labs in 2 months    The total time spent in the appointment was 20 minutes and more than 50% was on counseling and direct patient cares.    All of the patient's questions were answered with apparent satisfaction. The patient knows to call the clinic with any problems, questions or concerns.    Sullivan Lone MD [place] AAHIVMS [facility] Hematology/Oncology Physician [facility]

## 2021-07-05 ENCOUNTER — Encounter: Payer: Self-pay | Admitting: Hematology

## 2021-07-06 ENCOUNTER — Other Ambulatory Visit: Payer: Self-pay | Admitting: *Deleted

## 2021-07-06 DIAGNOSIS — Z79899 Other long term (current) drug therapy: Secondary | ICD-10-CM

## 2021-07-06 DIAGNOSIS — I10 Essential (primary) hypertension: Secondary | ICD-10-CM

## 2021-07-06 MED ORDER — ATORVASTATIN CALCIUM 20 MG PO TABS: 20.0000 mg | ORAL_TABLET | Freq: Every day | ORAL | 1 refills | Status: DC

## 2021-07-06 MED ORDER — ATORVASTATIN CALCIUM 20 MG PO TABS: 20.0000 mg | ORAL_TABLET | Freq: Every day | ORAL | 3 refills | Status: DC

## 2021-08-21 ENCOUNTER — Other Ambulatory Visit: Payer: Self-pay | Admitting: Hematology

## 2021-08-21 DIAGNOSIS — C9110 Chronic lymphocytic leukemia of B-cell type not having achieved remission: Secondary | ICD-10-CM

## 2021-08-22 ENCOUNTER — Encounter: Payer: Self-pay | Admitting: Hematology

## 2021-08-24 ENCOUNTER — Other Ambulatory Visit: Payer: Self-pay

## 2021-08-24 DIAGNOSIS — C9110 Chronic lymphocytic leukemia of B-cell type not having achieved remission: Secondary | ICD-10-CM

## 2021-08-25 ENCOUNTER — Inpatient Hospital Stay: Payer: 59 | Attending: Hematology and Oncology | Admitting: Hematology

## 2021-08-25 ENCOUNTER — Encounter: Payer: Self-pay | Admitting: Podiatry

## 2021-08-25 ENCOUNTER — Other Ambulatory Visit: Payer: Self-pay

## 2021-08-25 ENCOUNTER — Ambulatory Visit (INDEPENDENT_AMBULATORY_CARE_PROVIDER_SITE_OTHER): Payer: 59

## 2021-08-25 ENCOUNTER — Inpatient Hospital Stay: Payer: 59

## 2021-08-25 ENCOUNTER — Ambulatory Visit: Payer: 59 | Admitting: Podiatry

## 2021-08-25 VITALS — BP 129/65 | HR 73 | Temp 97.9°F | Resp 20 | Wt 147.3 lb

## 2021-08-25 DIAGNOSIS — M76822 Posterior tibial tendinitis, left leg: Secondary | ICD-10-CM | POA: Diagnosis not present

## 2021-08-25 DIAGNOSIS — C9110 Chronic lymphocytic leukemia of B-cell type not having achieved remission: Secondary | ICD-10-CM | POA: Diagnosis not present

## 2021-08-25 DIAGNOSIS — K219 Gastro-esophageal reflux disease without esophagitis: Secondary | ICD-10-CM | POA: Insufficient documentation

## 2021-08-25 DIAGNOSIS — M2010 Hallux valgus (acquired), unspecified foot: Secondary | ICD-10-CM

## 2021-08-25 DIAGNOSIS — M2011 Hallux valgus (acquired), right foot: Secondary | ICD-10-CM | POA: Diagnosis not present

## 2021-08-25 DIAGNOSIS — M778 Other enthesopathies, not elsewhere classified: Secondary | ICD-10-CM

## 2021-08-25 DIAGNOSIS — M2012 Hallux valgus (acquired), left foot: Secondary | ICD-10-CM | POA: Diagnosis not present

## 2021-08-25 DIAGNOSIS — Z853 Personal history of malignant neoplasm of breast: Secondary | ICD-10-CM | POA: Insufficient documentation

## 2021-08-25 DIAGNOSIS — M19072 Primary osteoarthritis, left ankle and foot: Secondary | ICD-10-CM

## 2021-08-25 DIAGNOSIS — D72820 Lymphocytosis (symptomatic): Secondary | ICD-10-CM | POA: Insufficient documentation

## 2021-08-25 DIAGNOSIS — G8929 Other chronic pain: Secondary | ICD-10-CM | POA: Insufficient documentation

## 2021-08-25 DIAGNOSIS — M2041 Other hammer toe(s) (acquired), right foot: Secondary | ICD-10-CM

## 2021-08-25 DIAGNOSIS — M19079 Primary osteoarthritis, unspecified ankle and foot: Secondary | ICD-10-CM | POA: Diagnosis not present

## 2021-08-25 DIAGNOSIS — N6092 Unspecified benign mammary dysplasia of left breast: Secondary | ICD-10-CM | POA: Insufficient documentation

## 2021-08-25 DIAGNOSIS — M2042 Other hammer toe(s) (acquired), left foot: Secondary | ICD-10-CM

## 2021-08-25 DIAGNOSIS — E559 Vitamin D deficiency, unspecified: Secondary | ICD-10-CM | POA: Insufficient documentation

## 2021-08-25 DIAGNOSIS — I059 Rheumatic mitral valve disease, unspecified: Secondary | ICD-10-CM | POA: Insufficient documentation

## 2021-08-25 DIAGNOSIS — Z5112 Encounter for antineoplastic immunotherapy: Secondary | ICD-10-CM

## 2021-08-25 DIAGNOSIS — R7303 Prediabetes: Secondary | ICD-10-CM | POA: Insufficient documentation

## 2021-08-25 LAB — CBC WITH DIFFERENTIAL (CANCER CENTER ONLY)
Abs Immature Granulocytes: 0 10*3/uL (ref 0.00–0.07)
Basophils Absolute: 0 10*3/uL (ref 0.0–0.1)
Basophils Relative: 1 %
Eosinophils Absolute: 0 10*3/uL (ref 0.0–0.5)
Eosinophils Relative: 0 %
HCT: 37.6 % (ref 36.0–46.0)
Hemoglobin: 13.2 g/dL (ref 12.0–15.0)
Immature Granulocytes: 0 %
Lymphocytes Relative: 25 %
Lymphs Abs: 1.1 10*3/uL (ref 0.7–4.0)
MCH: 31.8 pg (ref 26.0–34.0)
MCHC: 35.1 g/dL (ref 30.0–36.0)
MCV: 90.6 fL (ref 80.0–100.0)
Monocytes Absolute: 0.5 10*3/uL (ref 0.1–1.0)
Monocytes Relative: 12 %
Neutro Abs: 2.7 10*3/uL (ref 1.7–7.7)
Neutrophils Relative %: 62 %
Platelet Count: 180 10*3/uL (ref 150–400)
RBC: 4.15 MIL/uL (ref 3.87–5.11)
RDW: 13.5 % (ref 11.5–15.5)
WBC Count: 4.3 10*3/uL (ref 4.0–10.5)
nRBC: 0 % (ref 0.0–0.2)

## 2021-08-25 LAB — CMP (CANCER CENTER ONLY)
ALT: 24 U/L (ref 0–44)
AST: 24 U/L (ref 15–41)
Albumin: 4.5 g/dL (ref 3.5–5.0)
Alkaline Phosphatase: 53 U/L (ref 38–126)
Anion gap: 11 (ref 5–15)
BUN: 20 mg/dL (ref 8–23)
CO2: 28 mmol/L (ref 22–32)
Calcium: 9.8 mg/dL (ref 8.9–10.3)
Chloride: 100 mmol/L (ref 98–111)
Creatinine: 0.8 mg/dL (ref 0.44–1.00)
GFR, Estimated: >60 mL/min (ref >60)
Glucose, Bld: 103 mg/dL — ABNORMAL HIGH (ref 70–99)
Potassium: 4.1 mmol/L (ref 3.5–5.1)
Sodium: 139 mmol/L (ref 135–145)
Total Bilirubin: 0.8 mg/dL (ref 0.3–1.2)
Total Protein: 6.8 g/dL (ref 6.5–8.1)

## 2021-08-25 LAB — LACTATE DEHYDROGENASE: LDH: 220 U/L — ABNORMAL HIGH (ref 98–192)

## 2021-08-26 ENCOUNTER — Encounter: Payer: Self-pay | Admitting: Hematology

## 2021-08-26 ENCOUNTER — Telehealth: Payer: Self-pay | Admitting: Hematology

## 2021-08-26 NOTE — Telephone Encounter (Signed)
Scheduled per 11/17 los, pt has been called and confirmed appt

## 2021-08-28 NOTE — Progress Notes (Signed)
Subjective:  Patient ID: Colleen Shannon, female    DOB: 25-Jul-1956,  MRN: [id number] HPI Chief Complaint  Patient presents with   Foot Pain    Dorsal midfoot and medial side bilateral (L>R) - bunion and hammer toe deformities, previous surgery 20 years ago, is scheduled for surgery with Ortho, but wanted 2nd opinion   New Patient (Initial Visit)    65 y.o. female presents with the above complaint.   ROS: Denies fever chills nausea vomiting muscle aches pains calf pain back pain chest pain shortness of breath.  She states that Dr. Delilah Shan with [facility] had given her multiple injections across the top of her foot to no avail.  Also she says that she saw Dr. Lucia Gaskins who referred her to Dr. Doran Durand.  Past Medical History:  Diagnosis Date   Arthritis    Breast CA ([place]) dx'd 2015   left   Bronchiectasis ([place])    Cancer ([facility])    hx of skin cancer    CLL (chronic lymphocytic leukemia) ([place]) dx'd 2015   Dyspnea    Dysrhythmia    PVCs   Elevated cholesterol 05/22/2012   Nuc stress test-Normal.   Endometrial polyp    GERD (gastroesophageal reflux disease)    Grade I diastolic dysfunction    noted on echo   History of palpitations    Hypertension 05/22/12   ECHO-EF >55% trace tricupsid regurgitation. There is mild mitral regurgitation.    MR (mitral regurgitation)    mild noted on echo   Plantar fasciitis of left foot    resolved   Pneumonia    history of x 2   PONV (postoperative nausea and vomiting)    Past Surgical History:  Procedure Laterality Date   ABDOMINAL SURGERY     Abdominoplasty   BRONCHIAL BIOPSY  06/29/2020   Procedure: BRONCHIAL BIOPSIES;  Surgeon: Collene Gobble, MD;  Location: [place];  Service: Cardiopulmonary;;   BRONCHIAL BRUSHINGS  06/29/2020   Procedure: BRONCHIAL BRUSHINGS;  Surgeon: Collene Gobble, MD;  Location: [place];  Service: Cardiopulmonary;;   BRONCHIAL WASHINGS  06/29/2020   Procedure: BRONCHIAL WASHINGS;  Surgeon: Collene Gobble,  MD;  [facility];  Service: Cardiopulmonary;;   CESAREAN SECTION     X 2   CHOLECYSTECTOMY     COLONSCOPY     DILATION AND CURETTAGE OF UTERUS     FOOT SURGERY Left    HYSTEROSCOPY     KNEE ARTHROTOMY Right 06/04/2019   Procedure: Right knee arthrotomy; scar excision;  Surgeon: [name], MD;  [facility];  Service: Orthopedics;  Laterality: Right;  54mn   KNEE CLOSED REDUCTION Right 07/20/2014   Procedure: RIGHT KNEE CLOSED MANIPULATION;  Surgeon: FGearlean Alf MD;  [facility];  Service: Orthopedics;  Laterality: Right;   KNEE SURGERY Right    arthroscopic   PARTIAL KNEE ARTHROPLASTY Left 2017   RADIOACTIVE SEED GUIDED EXCISIONAL BREAST BIOPSY N/A 10/06/2014   Procedure: RADIOACTIVE SEED GUIDED EXCISIONAL BREAST BIOPSY;  Surgeon: MRolm Bookbinder MD;  [place]  Service: General;  Laterality: N/A;   REFRACTIVE SURGERY     TOTAL KNEE ARTHROPLASTY Right 05/11/2014   Procedure: RIGHT TOTAL KNEE ARTHROPLASTY;  Surgeon: FGearlean Alf MD;  [facility];  Service: Orthopedics;  Laterality: Right;   TUBAL LIGATION     VIDEO BRONCHOSCOPY N/A 06/29/2020   Procedure: VIDEO BRONCHOSCOPY WITHOUT FLUORO;  Surgeon: BCollene Gobble MD;  [facility];  Service: Cardiopulmonary;  Laterality: N/A;  with BAL    Current Outpatient Medications:    albuterol (VENTOLIN HFA) 108 (90 Base) MCG/ACT inhaler, Inhale 1-2 puffs into the lungs every 6 (six) hours as needed for wheezing or shortness of breath., Disp: , Rfl:    amLODipine (NORVASC) 5 MG tablet, TAKE 1 TABLET BY MOUTH AT  BEDTIME, Disp: 90 tablet, Rfl: 3   atorvastatin (LIPITOR) 20 MG tablet, Take 1 tablet (20 mg total) by mouth at bedtime., Disp: 30 tablet, Rfl: 1   B Complex Vitamins (B COMPLEX 1 PO), , Disp: , Rfl:    Calcium Carb-Cholecalciferol (CALCIUM-VITAMIN D3) 600-400 MG-UNIT TABS, Take 1 tablet by mouth 2 (two) times daily., Disp: , Rfl:    carvedilol (COREG) 6.25 MG  tablet, Take 1 tablet (6.25 mg total) by mouth 2 (two) times daily with a meal., Disp: 180 tablet, Rfl: 1   cholecalciferol (VITAMIN D3) 25 MCG (1000 UNIT) tablet, Take 1,000 Units by mouth in the morning and at bedtime., Disp: , Rfl:    Cyanocobalamin (B-12) 2500 MCG TABS, Take 2,500 mcg by mouth daily., Disp: , Rfl:    ezetimibe (ZETIA) 10 MG tablet, Take 1 tablet (10 mg total) by mouth at bedtime., Disp: 30 tablet, Rfl: 0   Multiple Vitamin (MULTIVITAMIN WITH MINERALS) TABS tablet, Take 1 tablet by mouth daily., Disp: , Rfl:    Omega-3 Fatty Acids (FISH OIL) 1200 MG CAPS, Take 1,200 mg by mouth in the morning and at bedtime. 120 mg-180 mg, Disp: , Rfl:    omeprazole (PRILOSEC) 20 MG capsule, Take 20 mg by mouth daily., Disp: , Rfl:    ondansetron (ZOFRAN) 8 MG tablet, Take 1 tablet (8 mg total) by mouth every 8 (eight) hours as needed for nausea., Disp: 30 tablet, Rfl: 3   Turmeric 500 MG TABS, Take 1,000 mg by mouth daily. , Disp: , Rfl:    VENCLEXTA 100 MG tablet, TAKE 2 TABLETS BY MOUTH  ONCE DAILY WITH FOOD AND A  FULL GLASS OF WATER, Disp: 60 tablet, Rfl: 1  No Known Allergies Review of Systems Objective:  There were no vitals filed for this visit.  General: Well developed, nourished, in no acute distress, alert and oriented x3   Dermatological: Skin is warm, dry and supple bilateral. Nails x 10 are well maintained; remaining integument appears unremarkable at this time. There are no open sores, no preulcerative lesions, no rash or signs of infection present.  Vascular: Dorsalis Pedis artery and Posterior Tibial artery pedal pulses are 2/4 bilateral with immedate capillary fill time. Pedal hair growth present. No varicosities and no lower extremity edema present bilateral.   Neruologic: Grossly intact via light touch bilateral. Vibratory intact via tuning fork bilateral. Protective threshold with Semmes Wienstein monofilament intact to all pedal sites bilateral. Patellar and Achilles  deep tendon reflexes 2+ bilateral. No Babinski or clonus noted bilateral.   Musculoskeletal: No gross boney pedal deformities bilateral. No pain, crepitus, or limitation noted with foot and ankle range of motion bilateral. Muscular strength 5/5 in all groups tested bilateral.  Hallux abductovalgus deformity relatively rigid in nature at the level of the first metatarsophalangeal joint left foot.  She has a very straight flail second toe from a previous surgery.  She also has medial deviation of the lesser toes bilaterally and pain across the midfoot on frontal plane range of motion.  Palpable spurring is noted bilateral.  Gait: Unassisted, Nonantalgic.    Radiographs: Radiographs taken today demonstrate an osseously mature individual with  hallux abductovalgus deformity and a previous McBride type procedure to the head of the first metatarsal.  She has medial and dorsal dislocation of the second toe which appears to have been fused in a previous surgery.  Medial deviation of the third fourth and fifth toes as well.  She has a very mild met adductus.  But does demonstrate some osteoarthritic changes and laxity at the first TMT joint.  Osteoarthritic changes of the tarsometatarsal joints particularly the second and third appears with a plantar distally oriented calcaneal heel spur.  Right foot demonstratesAn osseously mature individual with medial deviation of all of the lesser digits to the right foot.  She has a mild met adductus with a very rectus first metatarsal phalangeal joint.  Hammertoe deformities noted second with under lapping of the third on radiographs.  Also demonstrates midfoot osteoarthritic changes talonavicular joint navicular cuneiform joints and the tarsometatarsal joints.  Small posterior heel spurs noted but no changes in the tendon.  Assessment & Plan:   Assessment: Digital adductus with significant osteoarthritic changes bilaterally.  Probable tear or rupture of the flexor tendon  to the second digit of the left foot.  Hallux abductovalgus deformity left.  Laxity of the first tarsometatarsal joint.  She has developed midfoot collapse bilaterally left greater than right  Plan: At this point I feel we need to evaluate the midfoot collapse and the osteoarthritic changes and determine the joints involved.  Also we need a forefoot MRI to evaluate a rupture of the flexor digitorum longus at the level of the second toe.  Requesting MRI for this longstanding deformity of both feet previously evaluated by multiple physicians.  Requesting MRI for surgical consideration as well as differential diagnoses.     Armari Fussell T. [place], [place]

## 2021-08-29 ENCOUNTER — Encounter: Payer: Self-pay | Admitting: Hematology

## 2021-09-01 ENCOUNTER — Encounter: Payer: Self-pay | Admitting: Hematology

## 2021-09-03 ENCOUNTER — Other Ambulatory Visit: Payer: Self-pay | Admitting: Cardiovascular Disease

## 2021-09-03 DIAGNOSIS — Z79899 Other long term (current) drug therapy: Secondary | ICD-10-CM

## 2021-09-03 DIAGNOSIS — I10 Essential (primary) hypertension: Secondary | ICD-10-CM

## 2021-09-05 NOTE — Progress Notes (Signed)
HEMATOLOGY/ONCOLOGY CLINIC NOTE  Date of Service: .08/25/2021   Patient Care Team: Carol Ada, MD as PCP - General (Family Medicine) Troy Sine, MD as PCP - Cardiology (Cardiology)  CHIEF COMPLAINTS/PURPOSE OF CONSULTATION:  CLL  HISTORY OF PRESENTING ILLNESS:  Colleen Shannon is a wonderful 65 y.o. female who has been referred to [place] by Dr. Lindi Adie for evaluation and management of CLL. The pt reports that she is doing well overall.   The pt reports that she was being seen by Dr. Lindi Adie for Lobular Carcinoma in situ (LCIS) of the left breast. She had a lumpectomy and recently completed 5 years of Tamoxifen for breast cancer prevention. Pt was having frequent respiratory infections 3-4 years ago and after some labwork was found to have CLL. Dr. Lindi Adie has been monitoring this and pt has never required treatment. She has not had as many issues with recurrent infections in the last 2-3 years. She has had two SCC lesions found and removed. Her last was found 8-10 years ago.   She has felt well over the last 6-12 months. Pt has had lymphadenopathy over the years that often swells and shrinks rapidly. About two weeks ago she began to notice bilateral neck swelling. It was uncomfortable at times and affected her breathing. Her neck swelling is starting to settle down and is no longer causing her discomfort. She received her last vaccine ([place]) in March and has not had any infections recently. Pt has joint pain for which she saw Dr. Estanislado Pandy who did not think that the pt has RA.   01/31/2016 Peripheral Blood Flow Cytometry Report revealed "Chronic Lymphocytic Leukemia."  Most recent lab results (12/01/2019) of CBC is as follows: all values are WNL except for WBC at 43.4K, PLT at 145K, Lymphs Abs at 37.2K, Mono Abs at 3.0K, Baso Abs at 0.2K, Glucose at 135, Total Protein at 6.1, ALP at 37. 12/01/2019 LDH at 285  On review of systems, pt reports joint pain, improving lumps and denies  fevers, chills, sweats, fatigue, unexpected weight loss, abdominal pain and any other symptoms.   On PMHx the pt reports Lobular Carcinoma in situ, CLL, Lumpectomy, Arthritis, Pneumonia, SCC. 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  INTERVAL HISTORY:  Colleen Shannon is a wonderful 65 y.o. female who is here for evaluation and management of CLL. The patient's last visit with [place] was on 06/28/2021. The pt reports that she is doing well overall.  The pt reports no new symptoms or concerns.  No fevers no chills no night sweats.  No new lumps or bumps.   She notes that she is going to hold off on her foot surgery at this time.  Lab results today 08/25/2021 CBC is within normal limits, CMP unremarkable, LDH 220.   MEDICAL HISTORY:  Past Medical History:  Diagnosis Date   Arthritis    Breast CA ([place]) dx'd 2015   left   Bronchiectasis ([place])    Cancer ([place])    hx of skin cancer    CLL (chronic lymphocytic leukemia) ([place]) dx'd 2015   Dyspnea    Dysrhythmia    PVCs   Elevated cholesterol 05/22/2012   Nuc stress test-Normal.   Endometrial polyp    GERD (gastroesophageal reflux disease)    Grade I diastolic dysfunction    noted on echo   History of palpitations    Hypertension 05/22/12   ECHO-EF >55% trace tricupsid regurgitation. There is mild mitral  regurgitation.    MR (mitral regurgitation)    mild noted on echo   Plantar fasciitis of left foot    resolved   Pneumonia    history of x 2   PONV (postoperative nausea and vomiting)     SURGICAL HISTORY: Past Surgical History:  Procedure Laterality Date   ABDOMINAL SURGERY     Abdominoplasty   BRONCHIAL BIOPSY  06/29/2020   Procedure: BRONCHIAL BIOPSIES;  Surgeon: Collene Gobble, MD;  Location: [facility];  Service: Cardiopulmonary;;   BRONCHIAL BRUSHINGS  06/29/2020   Procedure: BRONCHIAL BRUSHINGS;  Surgeon: Collene Gobble, MD;  Location: [facility];  Service: Cardiopulmonary;;   BRONCHIAL  WASHINGS  06/29/2020   Procedure: BRONCHIAL WASHINGS;  Surgeon: Collene Gobble, MD;  Location: [facility];  Service: Cardiopulmonary;;   CESAREAN SECTION     X 2   CHOLECYSTECTOMY     COLONSCOPY     DILATION AND CURETTAGE OF UTERUS     FOOT SURGERY Left    HYSTEROSCOPY     KNEE ARTHROTOMY Right 06/04/2019   Procedure: Right knee arthrotomy; scar excision;  Surgeon: [name], MD;  Location: [facility];  Service: Orthopedics;  Laterality: Right;  63mn   KNEE CLOSED REDUCTION Right 07/20/2014   Procedure: RIGHT KNEE CLOSED MANIPULATION;  Surgeon: FGearlean Alf MD;  Location: [facility];  Service: Orthopedics;  Laterality: Right;   KNEE SURGERY Right    arthroscopic   PARTIAL KNEE ARTHROPLASTY Left 2017   RADIOACTIVE SEED GUIDED EXCISIONAL BREAST BIOPSY N/A 10/06/2014   Procedure: RADIOACTIVE SEED GUIDED EXCISIONAL BREAST BIOPSY;  Surgeon: MRolm Bookbinder MD;  [place] [facility]  Service: General;  Laterality: N/A;   REFRACTIVE SURGERY     TOTAL KNEE ARTHROPLASTY Right 05/11/2014   Procedure: RIGHT TOTAL KNEE ARTHROPLASTY;  Surgeon: FGearlean Alf MD;  Location: [facility];  Service: Orthopedics;  Laterality: Right;   TUBAL LIGATION     VIDEO BRONCHOSCOPY N/A 06/29/2020   Procedure: VIDEO BRONCHOSCOPY WITHOUT FLUORO;  Surgeon: BCollene Gobble MD;  [facility];  Service: Cardiopulmonary;  Laterality: N/A;  with BAL    SOCIAL HISTORY: Social History   Socioeconomic History   Marital status: Married    Spouse name: Not on file   Number of children: Not on file   Years of education: Not on file   Highest education level: Not on file  Occupational History   Not on file  Tobacco Use   Smoking status: Never   Smokeless tobacco: Never  Vaping Use   Vaping Use: Never used  Substance and Sexual Activity   Alcohol use: Yes    Comment: occasional    Drug use: No   Sexual activity: Yes    Birth control/protection: Surgical, Post-menopausal    Comment:  Hysterectomy  Other Topics Concern   Not on file  Social History Narrative   Not on file   Social Determinants of Health   Financial Resource Strain: Not on file  Food Insecurity: Not on file  Transportation Needs: Not on file  Physical Activity: Not on file  Stress: Not on file  Social Connections: Not on file  Intimate Partner Violence: Not on file    FAMILY HISTORY: Family History  Problem Relation Age of Onset   Heart disease Father    Dementia Father    Aneurysm Mother    Hyperlipidemia Mother    Alzheimer's disease Mother    Osteoarthritis Mother    Hypertension Brother  High Cholesterol Brother    Cancer - Other Paternal Grandmother        uterine   Hypertension Paternal Grandfather    Stroke Paternal Grandfather    Heart disease Maternal Grandfather    Cancer - Other Maternal Grandfather        lung   Cushing syndrome Brother    Healthy Son    Healthy Daughter     ALLERGIES:  has No Known Allergies.  MEDICATIONS:  Current Outpatient Medications  Medication Sig Dispense Refill   albuterol (VENTOLIN HFA) 108 (90 Base) MCG/ACT inhaler Inhale 1-2 puffs into the lungs every 6 (six) hours as needed for wheezing or shortness of breath.     amLODipine (NORVASC) 5 MG tablet TAKE 1 TABLET BY MOUTH AT  BEDTIME 90 tablet 3   atorvastatin (LIPITOR) 20 MG tablet Take 1 tablet (20 mg total) by mouth at bedtime. 30 tablet 1   B Complex Vitamins (B COMPLEX 1 PO)      Calcium Carb-Cholecalciferol (CALCIUM-VITAMIN D3) 600-400 MG-UNIT TABS Take 1 tablet by mouth 2 (two) times daily.     carvedilol (COREG) 6.25 MG tablet Take 1 tablet (6.25 mg total) by mouth 2 (two) times daily with a meal. 180 tablet 1   cholecalciferol (VITAMIN D3) 25 MCG (1000 UNIT) tablet Take 1,000 Units by mouth in the morning and at bedtime.     Cyanocobalamin (B-12) 2500 MCG TABS Take 2,500 mcg by mouth daily.     ezetimibe (ZETIA) 10 MG tablet Take 1 tablet (10 mg total) by mouth at bedtime. 30  tablet 0   Multiple Vitamin (MULTIVITAMIN WITH MINERALS) TABS tablet Take 1 tablet by mouth daily.     Omega-3 Fatty Acids (FISH OIL) 1200 MG CAPS Take 1,200 mg by mouth in the morning and at bedtime. 120 mg-180 mg     omeprazole (PRILOSEC) 20 MG capsule Take 20 mg by mouth daily.     ondansetron (ZOFRAN) 8 MG tablet Take 1 tablet (8 mg total) by mouth every 8 (eight) hours as needed for nausea. 30 tablet 3   Turmeric 500 MG TABS Take 1,000 mg by mouth daily.      VENCLEXTA 100 MG tablet TAKE 2 TABLETS BY MOUTH  ONCE DAILY WITH FOOD AND A  FULL GLASS OF WATER 60 tablet 1   No current facility-administered medications for this visit.    REVIEW OF SYSTEMS:   .10 Point review of Systems was done is negative except as noted above.  PHYSICAL EXAMINATION: ECOG PERFORMANCE STATUS: 0 - Asymptomatic .BP 129/65   Pulse 73   Temp 97.9 F (36.6 C)   Resp 20   Wt 147 lb 4.8 oz (66.8 kg)   LMP 11/19/2011 (LMP Unknown)   SpO2 99%   BMI 27.83 kg/m   . GENERAL:alert, in no acute distress and comfortable SKIN: no acute rashes, no significant lesions EYES: conjunctiva are pink and non-injected, sclera anicteric OROPHARYNX: MMM, no exudates, no oropharyngeal erythema or ulceration NECK: supple, no JVD LYMPH:  no palpable lymphadenopathy in the cervical, axillary or inguinal regions LUNGS: clear to auscultation b/l with normal respiratory effort HEART: regular rate & rhythm ABDOMEN:  normoactive bowel sounds , non tender, not distended. Extremity: no pedal edema PSYCH: alert & oriented x 3 with fluent speech NEURO: no focal motor/sensory deficits   LABORATORY DATA:  I have reviewed the data as listed  . CBC Latest Ref Rng & Units 08/25/2021 06/28/2021 04/25/2021  WBC 4.0 - 10.5 K/uL 4.3  5.4 3.3(L)  Hemoglobin 12.0 - 15.0 g/dL 13.2 13.1 14.1  Hematocrit 36.0 - 46.0 % 37.6 38.0 40.3  Platelets 150 - 400 K/uL 180 172 188    . CMP Latest Ref Rng & Units 08/25/2021 06/28/2021 04/25/2021   Glucose 70 - 99 mg/dL 103(H) 105(H) 101(H)  BUN 8 - 23 mg/dL _0 Creatinine 0.44 - 1.00 mg/dL 0.80 0.86 0.72  Sodium 135 - 145 mmol/L 139 142 139  Potassium 3.5 - 5.1 mmol/L 4.1 3.8 4.5  Chloride 98 - 111 mmol/L 100 104 100  CO2 22 - 32 mmol/L _1 Calcium 8.9 - 10.3 mg/dL 9.8 9.9 10.0  Total Protein 6.5 - 8.1 g/dL 6.8 6.7 6.7  Total Bilirubin 0.3 - 1.2 mg/dL 0.8 0.8 0.8  Alkaline Phos 38 - 126 U/L 53 50 47  AST 15 - 41 U/L _2 ALT 0 - 44 U/L _3 . Lab Results  Component Value Date   LDH 220 (H) 08/25/2021      03/03/2020 FISH (CLL Prognosis) Panel:     RADIOGRAPHIC STUDIES: I have personally reviewed the radiological images as listed and agreed with the findings in the report. DG Foot Complete Left  Result Date: 08/25/2021 Please see detailed radiograph report in office note.  DG Foot Complete Right  Result Date: 08/25/2021 Please see detailed radiograph report in office note.    ASSESSMENT & PLAN:   65 yo with   1) Rai Stage 1 Chronic lymphocytic Leukemia. Found to have trisomy 12 and 13q deletion via 03/03/2020 FISH (CLL Prognosis) Panel  2) Fever 3) Abnormal LFTs 4) recent COVID 19 infection -- resolved.  PLAN: -Discussed pt labwork today, 08/25/2021; blood counts stable, Hgb and plt normal, . Lab Results  Component Value Date   LDH 220 (H) 08/25/2021   -labs stable. Advised pt that one year of Venetoclax will be the end of this year 2022 -The pt has no prohibitive toxicities from continuing 200 mg Venetoclax daily. -Recommend pt continue to drink 48-64 oz water daily.  -Ct chest/abd/pelvis in 18 days (scheduled for 09/19/2021) Phone visit in 21days with Dr Irene Limbo   2) Neoplasm of left breast, primary tumor staging category Tis: lobular carcinoma in situ (LCIS) Left breast LCIS status post lumpectomy 10/06/2014; Started tamoxifen for breast cancer risk reduction 20 mg daily 11/18/2014 , decrease to 10 mg on 11/22/2017  completed February 2021.   Breast cancer surveillance:  Mammogram and ultrasound 05/20/2019 at [facility]: Benign breast density category B Breast exam 02/23/2020: Benign Plan: -continue breast self examinations -MMG Q12 months    FOLLOW UP: Ct chest/abd/pelvis in 18 days Phone visit in 21days with Dr Irene Limbo    . The total time spent in the appointment was 20 minutes and more than 50% was on counseling and direct patient cares.     All of the patient's questions were answered with apparent satisfaction. The patient knows to call the clinic with any problems, questions or concerns.    Sullivan Lone MD [place] AAHIVMS [facility] Hematology/Oncology Physician [facility]

## 2021-09-14 ENCOUNTER — Ambulatory Visit: Payer: 59 | Attending: Internal Medicine

## 2021-09-14 ENCOUNTER — Other Ambulatory Visit (HOSPITAL_BASED_OUTPATIENT_CLINIC_OR_DEPARTMENT_OTHER): Payer: Self-pay

## 2021-09-14 DIAGNOSIS — Z23 Encounter for immunization: Secondary | ICD-10-CM

## 2021-09-14 MED ORDER — PFIZER COVID-19 VAC BIVALENT 30 MCG/0.3ML IM SUSP
INTRAMUSCULAR | 0 refills | Status: DC
  Filled 2021-09-14: qty 0.3, 1d supply, fill #0

## 2021-09-14 NOTE — Progress Notes (Signed)
   Covid-19 Vaccination Clinic  Name:  Colleen Shannon    MRN: [id number] DOB: [birth year]  09/14/2021  Colleen Shannon was observed post Covid-19 immunization for 15 minutes without incident. She was provided with Vaccine Information Sheet and instruction to access the V-Safe system.   Colleen Shannon was instructed to call 911 with any severe reactions post vaccine: Difficulty breathing  Swelling of face and throat  A fast heartbeat  A bad rash all over body  Dizziness and weakness   Immunizations Administered     Name Date Dose VIS Date Route   Pfizer Covid-19 Vaccine Bivalent Booster 09/14/2021 12:51 PM 0.3 mL 06/08/2021 Intramuscular   Manufacturer: [place]   Lot: [id number]   [place]: [phone]

## 2021-09-15 ENCOUNTER — Other Ambulatory Visit (HOSPITAL_BASED_OUTPATIENT_CLINIC_OR_DEPARTMENT_OTHER): Payer: Self-pay

## 2021-09-16 ENCOUNTER — Other Ambulatory Visit: Payer: Self-pay

## 2021-09-16 ENCOUNTER — Ambulatory Visit
Admission: RE | Admit: 2021-09-16 | Discharge: 2021-09-16 | Disposition: A | Discharge status: Home / Self Care | Payer: 59 | Source: Ambulatory Visit | Attending: Podiatry | Admitting: Podiatry

## 2021-09-16 DIAGNOSIS — M76822 Posterior tibial tendinitis, left leg: Secondary | ICD-10-CM

## 2021-09-16 DIAGNOSIS — M778 Other enthesopathies, not elsewhere classified: Secondary | ICD-10-CM

## 2021-09-16 DIAGNOSIS — M19079 Primary osteoarthritis, unspecified ankle and foot: Secondary | ICD-10-CM

## 2021-09-16 DIAGNOSIS — M19072 Primary osteoarthritis, left ankle and foot: Secondary | ICD-10-CM

## 2021-09-19 ENCOUNTER — Ambulatory Visit (HOSPITAL_COMMUNITY)
Admission: RE | Admit: 2021-09-19 | Discharge: 2021-09-19 | Disposition: A | Discharge status: Home / Self Care | Payer: 59 | Source: Ambulatory Visit | Attending: Hematology | Admitting: Hematology

## 2021-09-19 ENCOUNTER — Encounter: Payer: Self-pay | Admitting: Hematology

## 2021-09-19 DIAGNOSIS — C9110 Chronic lymphocytic leukemia of B-cell type not having achieved remission: Secondary | ICD-10-CM

## 2021-09-19 MED ORDER — SODIUM CHLORIDE (PF) 0.9 % IJ SOLN
INTRAMUSCULAR | Status: AC
  Filled 2021-09-19: qty 50

## 2021-09-19 MED ORDER — IOHEXOL 350 MG/ML SOLN
80.0000 mL | Freq: Once | INTRAVENOUS | Status: AC | PRN
  Administered 2021-09-19: 80 mL via INTRAVENOUS

## 2021-09-21 ENCOUNTER — Ambulatory Visit: Payer: 59 | Admitting: Cardiovascular Disease

## 2021-09-21 ENCOUNTER — Other Ambulatory Visit: Payer: Self-pay

## 2021-09-21 ENCOUNTER — Encounter: Payer: Self-pay | Admitting: Cardiovascular Disease

## 2021-09-21 VITALS — BP 112/62 | HR 69 | Ht 61.0 in | Wt 149.0 lb

## 2021-09-21 DIAGNOSIS — I10 Essential (primary) hypertension: Secondary | ICD-10-CM | POA: Diagnosis not present

## 2021-09-21 DIAGNOSIS — D0502 Lobular carcinoma in situ of left breast: Secondary | ICD-10-CM | POA: Diagnosis not present

## 2021-09-21 DIAGNOSIS — I700 Atherosclerosis of aorta: Secondary | ICD-10-CM

## 2021-09-21 DIAGNOSIS — E785 Hyperlipidemia, unspecified: Secondary | ICD-10-CM

## 2021-09-21 DIAGNOSIS — C9110 Chronic lymphocytic leukemia of B-cell type not having achieved remission: Secondary | ICD-10-CM | POA: Diagnosis not present

## 2021-09-21 NOTE — Patient Instructions (Signed)

## 2021-09-21 NOTE — Progress Notes (Signed)
Patient ID: Colleen Shannon, female   DOB: 1956/09/23, 65 y.o.   MRN: [id number]     HPI: Colleen Shannon is a 65 y.o. female presents to the office today for an 38 month cardiology evaluation.  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  Colleen Shannon has a history of hypertension and previous chest pain for which he was started on carvedilol and initially nifedipine by Dr. Elisabeth Cara with a presumptive dose of possible spasm. In August 2013 a nuclear perfusion study was entirely normal. 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 She has been on lipid-lowering therapy with atorvastatin. When I saw her 2 years ago I discontinued her nifedipine and switched her to amlodipine. She states her blood pressure has been controlled on this regimen. An echo Doppler study in August 2013 showed normal systolic function. She did have mild mitral regurgitation.  Over the past year, she has done well from a cardiac standpoint.  She denies any episodes of chest pain.  He denies palpitations.  She is now followed by Dr. Carol Ada for primary care.  Recent laboratory had been drawn for which she was told was excellent.  Colleen Shannon underwent right knee replacement surgery by Dr. Sandie Ano on 05/11/2014.  She tolerated that well from a cardiovascular standpoint.  She underwent knee replacement to her left knee in November 2017.  She also was diagnosed as having CLL.  She is followed by Dr. Lindi Adie. .  She continues to take tamoxifen for previous carcinoma in situ on breast biopsy.   I last saw her in January 2019 at which time she had noticed palpitations which may occur 4-5 days per week.  These typically are isolated and last for approximately 20 seconds.  She denies any sustained episodes of tachycardia.  She denies chest pain.  I recommended slight titration of her current date of carvedilol up to  9.375 mg twice a day.  Her blood pressure was stable and she was advised to continue her current dose of amlodipine.    Follow-up echo Doppler study in February 2019 which showed an EF of 55 to 60% with grade 1 diastolic dysfunction.  There was mild mitral regurgitation.  Pulmonary pressures were normal.    She was seen in April 2019 by Almyra Deforest, PA and had noted that her baseline heart rate had been increasing to the 80s.  She was drinking 1 cup of coffee per day.  Carvedilol was further increased to 12.5 mg twice a day.  When I last saw her in July 2019 she denied any episodes of chest pain but was concerned since her younger brother had suffered an MI and had required insertion of for coronary stents.  During that evaluation she was inquiring about screening for CAD.   She underwent a CT cardiac scoring study which showed a coronary calcium score of 0.  Her chest CT did show diffuse bronchial wall thickening.  She states as result of her CLL she does have episodes of bronchitis frequently.  I last saw her in November 2019 unfortunately her brother died at age 8 and was ultimately found to have a pituitary adenoma with development of Cushing's disease and 12 days following his resection of his adenoma he apparently had a bleed requiring mechanical ventilation and ultimately developed multisystem organ failure leading to his death.  This has been stressful  for Colleen Shannon.  She denied any chest pain.   She has had difficulty with her right knee.  She had previously undergone bilateral knee replacements.  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  She was followed by Dr. Leland Johns for her left breast cancer originally diagnosed in 2015 and currently on tamoxifen as well as her CLL with most recent white blood count 39.9 thousand which has been fairly stable.     I saw her in December 2020.  Since my last evaluation, she is now being followed by Dr. Irene Limbo for her CLL.  She is felt to have RAI stage I chronic lymphocytic leukemia.  She is now undergoing treatment with Gazyva.  She was last evaluated by me on November 01, 2020.  At that time she had lost  weight down to 120 pounds.with her weight loss her blood pressure became low and she ultimately stopped taking amlodipine.  She continued to be on carvedilol at a reduced dose of 6.25 mg twice a day.  Recently she had noticed some weight gain rest her blood pressure slowly increasing.  She denied any chest tightness or shortness of breath, presyncope, palpitations or syncope.    Since I last saw her, she has continued to remain stable.  She is status post prior breast CVA for which she was seen by Dr. Payton Mccallum for lobular carcinoma in situ of her left breast, and had lumpectomy and completed 5 years of tamoxifen.  More recently she was diagnosed with CLL and is now seen by Dr.Kale.  She last saw him on August 25, 2021.  She was felt to have Rai stage I and in the past had fever and abnormal LFTs.  Recent CT scan of her chest abdomen pelvis was completed which showed no evidence for persistent lymphadenopathy in the chest abdomen or pelvis.  She had a normal spleen.  She was status postcholecystectomy.  Of note there was suggestion of aortic atherosclerosis.  She continues to be pain-free and asymptomatic from a cardiac standpoint.  She is followed by Dr. Carol Ada for her primary care.  She presents for evaluation.   Past Medical History:  Diagnosis Date   Arthritis    Breast CA ([place]) dx'd 2015   left   Bronchiectasis ([place])    Cancer (H[facility])    hx of skin cancer    CLL (chronic lymphocytic leukemia) ([place]) dx'd 2015   Dyspnea    Dysrhythmia    PVCs   Elevated cholesterol 05/22/2012   Nuc stress test-Normal.   Endometrial polyp    GERD (gastroesophageal reflux disease)    Grade I diastolic dysfunction     noted on echo   History of palpitations    Hypertension 05/22/12   ECHO-EF >55% trace tricupsid regurgitation. There is mild mitral regurgitation.    MR (mitral regurgitation)    mild noted on echo   Plantar fasciitis of left foot    resolved   Pneumonia    history of x 2   PONV (postoperative nausea and vomiting)     Past Surgical History:  Procedure Laterality Date   ABDOMINAL SURGERY     Abdominoplasty   BRONCHIAL BIOPSY  06/29/2020   Procedure: BRONCHIAL BIOPSIES;  Surgeon: Collene Gobble, MD;  Location[facility] ENDOSCOPY;  Service: Cardiopulmonary;;   BRONCHIAL BRUSHINGS  06/29/2020   Procedure: BRONCHIAL BRUSHINGS;  Surgeon: Collene Gobble, MD;  Location: [facility] ENDOSCOPY;  Service: Cardiopulmonary;;   BRONCHIAL WASHINGS  06/29/2020   Procedure: BRONCHIAL WASHINGS;  Surgeon: Collene Gobble, MD;  Location: [facility] ENDOSCOPY;  Service: Cardiopulmonary;;   CESAREAN SECTION     X 2   CHOLECYSTECTOMY     COLONSCOPY     DILATION AND CURETTAGE OF UTERUS     FOOT SURGERY Left    HYSTEROSCOPY     KNEE ARTHROTOMY Right 06/04/2019   Procedure: Right knee arthrotomy; scar excision;  Surgeon: [name], MD;  Location: [facility] ORS;  Service: Orthopedics;  Laterality: Right;  82mn   KNEE CLOSED REDUCTION Right 07/20/2014   Procedure: RIGHT KNEE CLOSED MANIPULATION;  Surgeon: FGearlean Alf MD;  Location: [facility] ORS;  Service: Orthopedics;  Laterality: Right;   KNEE SURGERY Right    arthroscopic   PARTIAL KNEE ARTHROPLASTY Left 2017   RADIOACTIVE SEED GUIDED EXCISIONAL BREAST BIOPSY N/A 10/06/2014   Procedure: RADIOACTIVE SEED GUIDED EXCISIONAL BREAST BIOPSY;  Surgeon: MRolm Bookbinder MD;  [facility]  Service: General;  Laterality: N/A;   REFRACTIVE SURGERY     TOTAL KNEE ARTHROPLASTY Right 05/11/2014   Procedure: RIGHT TOTAL KNEE ARTHROPLASTY;  Surgeon: FGearlean Alf MD;  Location: [facility] ORS;  Service: Orthopedics;  Laterality: Right;   TUBAL LIGATION     VIDEO  BRONCHOSCOPY N/A 06/29/2020   Procedure: VIDEO BRONCHOSCOPY WITHOUT FLUORO;  Surgeon: BCollene Gobble MD;  [facility];  Service: Cardiopulmonary;  Laterality: N/A;  with BAL    No Known Allergies  Current Outpatient Medications  Medication Sig Dispense Refill   albuterol (VENTOLIN HFA) 108 (90 Base) MCG/ACT inhaler Inhale 1-2 puffs into the lungs every 6 (six) hours as needed for wheezing or shortness of breath.     amLODipine (NORVASC) 5 MG tablet TAKE 1 TABLET BY MOUTH AT  BEDTIME 90 tablet 3   atorvastatin (LIPITOR) 20 MG tablet Take 1 tablet (20 mg total) by mouth at bedtime. 30 tablet 1   B Complex Vitamins (B COMPLEX 1 PO)      Calcium Carb-Cholecalciferol (CALCIUM-VITAMIN D3) 600-400 MG-UNIT TABS Take 1 tablet by mouth 2 (two) times daily.     carvedilol (COREG) 6.25 MG tablet Take 1 tablet (6.25 mg total) by mouth 2 (two) times daily with a meal. 180 tablet 1   cholecalciferol (VITAMIN D3) 25 MCG (1000 UNIT) tablet Take 1,000 Units by mouth in the morning and at bedtime.     COVID-19 mRNA bivalent vaccine, Pfizer, (PFIZER COVID-19 VAC BIVALENT) injection Inject into the muscle. 0.3 mL 0   Cyanocobalamin (B-12) 2500 MCG TABS Take 2,500 mcg by mouth daily.     ezetimibe (ZETIA) 10 MG tablet TAKE 1 TABLET BY MOUTH AT  BEDTIME 90 tablet 3   famotidine (PEPCID) 40 MG tablet 1 tab(s) orally 1-2 times a day for 30 days     Multiple Vitamin (MULTIVITAMIN WITH MINERALS) TABS tablet Take 1 tablet by mouth daily.     VENCLEXTA 100 MG tablet TAKE 2 TABLETS BY MOUTH  ONCE DAILY WITH FOOD AND A  FULL GLASS OF WATER 60 tablet 1   No current facility-administered medications for this visit.    Social History   Socioeconomic History   Marital status: Married    Spouse name: Not on file   Number of children: Not on file   Years of education: Not on file   Highest education level: Not on file  Occupational History  Not on file  Tobacco Use   Smoking status: Never   Smokeless  tobacco: Never  Vaping Use   Vaping Use: Never used  Substance and Sexual Activity   Alcohol use: Yes    Comment: occasional    Drug use: No   Sexual activity: Yes    Birth control/protection: Surgical, Post-menopausal    Comment: Hysterectomy  Other Topics Concern   Not on file  Social History Narrative   Not on file   Social Determinants of Health   Financial Resource Strain: Not on file  Food Insecurity: Not on file  Transportation Needs: Not on file  Physical Activity: Not on file  Stress: Not on file  Social Connections: Not on file  Intimate Partner Violence: Not on file   Additional social history is notable that she is married for 35 years. 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 There is no tobacco use. He does drink occasional wine. .  She is status post bilateral knee replacements.   Family History  Problem Relation Age of Onset   Heart disease Father    Dementia Father    Aneurysm Mother    Hyperlipidemia Mother    Alzheimer's disease Mother    Osteoarthritis Mother    Hypertension Brother    High Cholesterol Brother    Cancer - Other Paternal Grandmother        uterine   Hypertension Paternal Grandfather    Stroke Paternal Grandfather    Heart disease Maternal Grandfather    Cancer - Other Maternal Grandfather        lung   Cushing syndrome Brother    Healthy Son    Healthy Daughter    ROS General: Negative; No fevers, chills, or night sweats;  HEENT: Negative; No changes in vision or hearing, sinus congestion, difficulty swallowing Pulmonary: Negative; No cough, wheezing, shortness of breath, hemoptysis Cardiovascular: Negative; No chest pain, presyncope, syncope, palpitations GI: Negative; No nausea, vomiting, diarrhea, or abdominal pain GU: Negative; No dysuria, hematuria, or difficulty voiding Musculoskeletal: Positive for bilateral knee replacement surgery;positive for osteoarthritis  no myalgias,  Hematologic/Oncology: positive for CLL, history of breast cancer Endocrine: Negative; no heat/cold intolerance; no diabetes Neuro: Negative; no changes in balance, headaches Skin: Negative; No rashes or skin lesions Psychiatric: Negative; No behavioral problems, depression Sleep: Negative; No snoring, daytime sleepiness, hypersomnolence, bruxism, restless legs, hypnogognic hallucinations, no cataplexy Other comprehensive 14 point system review is negative.  PE BP 112/62    Pulse 69    Ht _0  (1.549 m)    Wt 149 lb (67.6 kg)    LMP 11/19/2011 (LMP Unknown)    SpO2 98%    BMI 28.15 kg/m    Repeat blood pressure was 110/64.  Wt Readings from Last 3 Encounters:  09/21/21 149 lb (67.6 kg)  08/25/21 147 lb 4.8 oz (66.8 kg)  06/28/21 148 lb 1.6 oz (67.2 kg)   General: Alert, oriented, no distress.  Skin: normal turgor, no rashes, warm and dry HEENT: Normocephalic, atraumatic. Pupils equal round and reactive to light; sclera anicteric; extraocular muscles intact;  Nose without nasal septal hypertrophy Mouth/Parynx benign; Mallinpatti scale 2 Neck: No JVD, no carotid bruits; normal carotid upstroke Lungs: clear to ausculatation and percussion; no wheezing or rales Chest wall: without tenderness to palpitation Heart: PMI not displaced, RRR, s1 s2 normal, 1/6 systolic murmur, no diastolic murmur, no rubs, gallops, thrills, or heaves Abdomen: soft, nontender; no  hepatosplenomehaly, BS+; abdominal aorta nontender and not dilated by palpation. Back: no CVA tenderness Pulses 2+ Musculoskeletal: full range of motion, normal strength, no joint deformities Extremities: no clubbing cyanosis or edema, Homan's sign negative  Neurologic: grossly nonfocal; Cranial nerves grossly wnl Psychologic: Normal mood and affect   September 21, 2021 ECG (independently read by me): NSR at 69, no ectopy, normal intervals  November 01, 2020 ECG (independently read by me): NSR at 67  December 2020  ECG (independently read by me): Normal sinus rhythm at 77 bpm.  No ST segment changes.  No ectopy.  Normal intervals.  November 2019 ECG (independently read by me): Normal sinus rhythm at 69 bpm.  No ectopy.  Normal intervals.  July 2019 ECG (independently read by me): Normal sinus rhythm at 64 bpm.  No ectopy.  Normal intervals.  No ST segment changes.  January 2019 ECG (independently read by me): normal sinus rhythm at 76 bpm.  Normal intervals.  No ectopy or ST changes  November 2016 ECG (independently read by me) from 07/17/2014: Normal sinus rhythm.  No ectopy.  Normal intervals.  No ST segment changes.  August 2014 ECG: Normal sinus rhythm at 75 beats per minute without ectopy. Normal intervals.  LABS: BMP Latest Ref Rng & Units 08/25/2021 06/28/2021 04/25/2021  Glucose 70 - 99 mg/dL 103(H) 105(H) 101(H)  BUN 8 - 23 mg/dL _0 Creatinine 0.44 - 1.00 mg/dL 0.80 0.86 0.72  BUN/Creat Ratio 12 - 28 - - -  Sodium 135 - 145 mmol/L 139 142 139  Potassium 3.5 - 5.1 mmol/L 4.1 3.8 4.5  Chloride 98 - 111 mmol/L 100 104 100  CO2 22 - 32 mmol/L _1 Calcium 8.9 - 10.3 mg/dL 9.8 9.9 10.0   Hepatic Function Latest Ref Rng & Units 08/25/2021 06/28/2021 04/25/2021  Total Protein 6.5 - 8.1 g/dL 6.8 6.7 6.7  Albumin 3.5 - 5.0 g/dL 4.5 4.3 4.2  AST 15 - 41 U/L _2 ALT 0 - 44 U/L _3 Alk Phosphatase 38 - 126 U/L 53 50 47  Total Bilirubin 0.3 - 1.2 mg/dL 0.8 0.8 0.8   CBC Latest Ref Rng & Units 08/25/2021 06/28/2021 04/25/2021  WBC 4.0 - 10.5 K/uL 4.3 5.4 3.3(L)  Hemoglobin 12.0 - 15.0 g/dL 13.2 13.1 14.1  Hematocrit 36.0 - 46.0 % 37.6 38.0 40.3  Platelets 150 - 400 K/uL 180 172 188   Lab Results  Component Value Date   MCV 90.6 08/25/2021   MCV 90.9 06/28/2021   MCV 88.4 04/25/2021   Lab Results  Component Value Date   TSH 1.860 01/30/2018   Lipid Panel     Component Value Date/Time   CHOL 131 07/15/2018 0830   TRIG 84 07/15/2018 0830   HDL 56 07/15/2018 0830    CHOLHDL 2.3 07/15/2018 0830   CHOLHDL 2.3 08/19/2015 0857   VLDL 17 08/19/2015 0857   LDLCALC 58 07/15/2018 0830    RADIOLOGY: No results found.  IMPRESSION:  1. Essential hypertension   2. Hyperlipidemia with target LDL less than 70   3. Neoplasm of left breast, primary tumor staging category Tis: lobular carcinoma in situ (LCIS)   4. CLL (chronic lymphocytic leukemia) ([place])   5. 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  In August 2013 a nuclear  perfusion study was normal.   When I saw her in January 2019, her blood pressure was mildly elevated based on recent change in guidelines and she was experiencing some episodic palpitations.  Her palpitations improved with slight titration of carvedilol and when seen several months later her dose was further titrated to 12.5 mg twice a day.  When I last saw her, her blood pressure was stable on amlodipine 5 mg and carvedilol 12.5 mg twice a day.  She had lost significant weight and discontinued amlodipine and her carvedilol dose has been reduced but at her last evaluation pressure had further increased.  Presently, she is now on a regimen consisting amlodipine 5 mg in addition to carvedilol 6.25 mg twice a day.  She continues to be on atorvastatin 20 mg and Zetia 10 mg for hyperlipidemia.  LDL cholesterol on August 02, 2021 was 85.  In 2019, after her younger brother had suffered a myocardial infarction and required stenting she underwent CT cardiac scoring which showed a calcium score of 0.  Presently she remains asymptomatic with reference to chest pain or shortness of breath.  She will be having repeat laboratory with her primary physician, Dr. Tamala Julian.  Presently I will continue her on her current regimen of atorvastatin and Zetia but if lipid studies continue to increase further  titration back to 40 mg of atorvastatin can be considered.  She has now completed 1 year of therapy for her CLL and will be following up with Dr.Kale regarding her CT imaging.  She will continue to monitor her blood pressure and heart rate at home.  As long as she is stable I will see her in 1 year for reevaluation or sooner as needed.  Troy Sine, MD, [facility]  09/21/2021 6:53 PM

## 2021-09-22 ENCOUNTER — Inpatient Hospital Stay: Payer: 59 | Attending: Hematology and Oncology | Admitting: Hematology

## 2021-09-22 ENCOUNTER — Telehealth: Payer: Self-pay | Admitting: *Deleted

## 2021-09-22 DIAGNOSIS — C9110 Chronic lymphocytic leukemia of B-cell type not having achieved remission: Secondary | ICD-10-CM

## 2021-09-22 NOTE — Telephone Encounter (Signed)
Patient notified

## 2021-09-22 NOTE — Telephone Encounter (Signed)
-----   Message from Garrel Ridgel, [place] sent at 09/22/2021  7:58 AM EST ----- Would YOU please let Kateryn know that we have the results and I would like to discuss surgical options with the group prior to seeing her. Our next doctors case meeting is the first Thursday of the January.  We will get back to her after that meeting.  Thanks

## 2021-09-23 ENCOUNTER — Telehealth: Payer: Self-pay | Admitting: Hematology

## 2021-09-23 NOTE — Telephone Encounter (Signed)
Scheduled follow-up appointment per 12/15 los. Patient is aware.

## 2021-10-07 ENCOUNTER — Encounter: Payer: Self-pay | Admitting: Hematology

## 2021-10-07 NOTE — Progress Notes (Signed)
HEMATOLOGY/ONCOLOGY PHONE VISIT NOTE  Date of Service: .09/22/2021   Patient Care Team: Carol Ada, MD as PCP - General (Family Medicine) Troy Sine, MD as PCP - Cardiology (Cardiology)  CHIEF COMPLAINTS/PURPOSE OF CONSULTATION:  Discussion of CT chest abdomen pelvis to evaluate treatment response of her CLL  HISTORY OF PRESENTING ILLNESS:  Please see previous note for details on initial presentation  INTERVAL HISTORY:  I connected with Colleen Shannon on 10/07/21 at  9:40 AM EST by telephone visit and verified that I am speaking with the correct person using two identifiers.   I discussed the limitations, risks, security and privacy concerns of performing an evaluation and management service by telemedicine and the availability of in-person appointments. I also discussed with the patient that there may be a patient responsible charge related to this service. The patient expressed understanding and agreed to proceed.   Other persons participating in the visit and their role in the encounter: none   Patients location: Home Providers location: [place]  Chief Complaint: Follow-up to discuss CT chest abdomen pelvis results for evaluation of treatment response of CLL.  Patient notes no acute new symptoms since her last clinic visit.  No infection issues.  No new toxicities with the venetoclax. No new lumps or bumps.  No fevers no chills no night sweats.  No new shortness of breath.  No other acute new focal symptoms. CT chest abdomen pelvis done on 09/19/2021 showed no evidence of persistent lymphadenopathy in the chest abdomen or pelvis.  No splenomegaly.  No other evidence of residual/persistent/progressive CLL.  We discussed and the patient will complete her current month of venetoclax and will then discontinue this having finished 12 months of treatment with the CLL appearing to be in complete remission.  We discussed the follow-up plan in  detail. We discussed staying up-to-date with her vaccination schedule which she has been doing.  She notes that she intends to currently hold off on her foot surgery as long as she is able to function okay with intermittent steroid injections.  MEDICAL HISTORY:  Past Medical History:  Diagnosis Date   Arthritis    Breast CA ([place]) dx'd 2015   left   Bronchiectasis ([place])    Cancer ([place])    hx of skin cancer    CLL (chronic lymphocytic leukemia) ([place]) dx'd 2015   Dyspnea    Dysrhythmia    PVCs   Elevated cholesterol 05/22/2012   Nuc stress test-Normal.   Endometrial polyp    GERD (gastroesophageal reflux disease)    Grade I diastolic dysfunction    noted on echo   History of palpitations    Hypertension 05/22/12   ECHO-EF >55% trace tricupsid regurgitation. There is mild mitral regurgitation.    MR (mitral regurgitation)    mild noted on echo   Plantar fasciitis of left foot    resolved   Pneumonia    history of x 2   PONV (postoperative nausea and vomiting)     SURGICAL HISTORY: Past Surgical History:  Procedure Laterality Date   ABDOMINAL SURGERY     Abdominoplasty   BRONCHIAL BIOPSY  06/29/2020   Procedure: BRONCHIAL BIOPSIES;  Surgeon: Collene Gobble, MD;  Location: [facility] ENDOSCOPY;  Service: Cardiopulmonary;;   BRONCHIAL BRUSHINGS  06/29/2020   Procedure: BRONCHIAL BRUSHINGS;  Surgeon: Collene Gobble, MD;  Location: [facility] ENDOSCOPY;  Service: Cardiopulmonary;;   BRONCHIAL WASHINGS  06/29/2020   Procedure: BRONCHIAL WASHINGS;  Surgeon: Baltazar Apo  S, MD;  Location: [place];  Service: Cardiopulmonary;;   CESAREAN SECTION     X 2   CHOLECYSTECTOMY     COLONSCOPY     DILATION AND CURETTAGE OF UTERUS     FOOT SURGERY Left    HYSTEROSCOPY     KNEE ARTHROTOMY Right 06/04/2019   Procedure: Right knee arthrotomy; scar excision;  Surgeon: [name], MD;  Location: [facility] ORS;  Service: Orthopedics;  Laterality: Right;  20mn   KNEE CLOSED REDUCTION Right 07/20/2014    Procedure: RIGHT KNEE CLOSED MANIPULATION;  Surgeon: FGearlean Alf MD;  Location: [facility] ORS;  Service: Orthopedics;  Laterality: Right;   KNEE SURGERY Right    arthroscopic   PARTIAL KNEE ARTHROPLASTY Left 2017   RADIOACTIVE SEED GUIDED EXCISIONAL BREAST BIOPSY N/A 10/06/2014   Procedure: RADIOACTIVE SEED GUIDED EXCISIONAL BREAST BIOPSY;  Surgeon: MRolm Bookbinder MD;  [place]  Service: General;  Laterality: N/A;   REFRACTIVE SURGERY     TOTAL KNEE ARTHROPLASTY Right 05/11/2014   Procedure: RIGHT TOTAL KNEE ARTHROPLASTY;  Surgeon: FGearlean Alf MD;  Location: [facility] ORS;  Service: Orthopedics;  Laterality: Right;   TUBAL LIGATION     VIDEO BRONCHOSCOPY N/A 06/29/2020   Procedure: VIDEO BRONCHOSCOPY WITHOUT FLUORO;  Surgeon: BCollene Gobble MD;  [facility];  Service: Cardiopulmonary;  Laterality: N/A;  with BAL    SOCIAL HISTORY: Social History   Socioeconomic History   Marital status: Married    Spouse name: Not on file   Number of children: Not on file   Years of education: Not on file   Highest education level: Not on file  Occupational History   Not on file  Tobacco Use   Smoking status: Never   Smokeless tobacco: Never  Vaping Use   Vaping Use: Never used  Substance and Sexual Activity   Alcohol use: Yes    Comment: occasional    Drug use: No   Sexual activity: Yes    Birth control/protection: Surgical, Post-menopausal    Comment: Hysterectomy  Other Topics Concern   Not on file  Social History Narrative   Not on file   Social Determinants of Health   Financial Resource Strain: Not on file  Food Insecurity: Not on file  Transportation Needs: Not on file  Physical Activity: Not on file  Stress: Not on file  Social Connections: Not on file  Intimate Partner Violence: Not on file    FAMILY HISTORY: Family History  Problem Relation Age of Onset   Heart disease Father    Dementia Father    Aneurysm Mother     Hyperlipidemia Mother    Alzheimer's disease Mother    Osteoarthritis Mother    Hypertension Brother    High Cholesterol Brother    Cancer - Other Paternal Grandmother        uterine   Hypertension Paternal Grandfather    Stroke Paternal Grandfather    Heart disease Maternal Grandfather    Cancer - Other Maternal Grandfather        lung   Cushing syndrome Brother    Healthy Son    Healthy Daughter     ALLERGIES:  has No Known Allergies.  MEDICATIONS:  Current Outpatient Medications  Medication Sig Dispense Refill   albuterol (VENTOLIN HFA) 108 (90 Base) MCG/ACT inhaler Inhale 1-2 puffs into the lungs every 6 (six) hours as needed for wheezing or shortness of breath.     amLODipine (NORVASC) 5 MG tablet  TAKE 1 TABLET BY MOUTH AT  BEDTIME 90 tablet 3   atorvastatin (LIPITOR) 20 MG tablet Take 1 tablet (20 mg total) by mouth at bedtime. 30 tablet 1   B Complex Vitamins (B COMPLEX 1 PO)      Calcium Carb-Cholecalciferol (CALCIUM-VITAMIN D3) 600-400 MG-UNIT TABS Take 1 tablet by mouth 2 (two) times daily.     carvedilol (COREG) 6.25 MG tablet Take 1 tablet (6.25 mg total) by mouth 2 (two) times daily with a meal. 180 tablet 1   cholecalciferol (VITAMIN D3) 25 MCG (1000 UNIT) tablet Take 1,000 Units by mouth in the morning and at bedtime.     COVID-19 mRNA bivalent vaccine, Pfizer, (PFIZER COVID-19 VAC BIVALENT) injection Inject into the muscle. 0.3 mL 0   Cyanocobalamin (B-12) 2500 MCG TABS Take 2,500 mcg by mouth daily.     ezetimibe (ZETIA) 10 MG tablet TAKE 1 TABLET BY MOUTH AT  BEDTIME 90 tablet 3   famotidine (PEPCID) 40 MG tablet 1 tab(s) orally 1-2 times a day for 30 days     Multiple Vitamin (MULTIVITAMIN WITH MINERALS) TABS tablet Take 1 tablet by mouth daily.     VENCLEXTA 100 MG tablet TAKE 2 TABLETS BY MOUTH  ONCE DAILY WITH FOOD AND A  FULL GLASS OF WATER 60 tablet 1   No current facility-administered medications for this visit.    REVIEW OF SYSTEMS:   .10 Point review  of Systems was done is negative except as noted above.    PHYSICAL EXAMINATION: Telemedicine visit  LABORATORY DATA:    . CBC Latest Ref Rng & Units 08/25/2021 06/28/2021 04/25/2021  WBC 4.0 - 10.5 K/uL 4.3 5.4 3.3(L)  Hemoglobin 12.0 - 15.0 g/dL 13.2 13.1 14.1  Hematocrit 36.0 - 46.0 % 37.6 38.0 40.3  Platelets 150 - 400 K/uL 180 172 188    . CMP Latest Ref Rng & Units 08/25/2021 06/28/2021 04/25/2021  Glucose 70 - 99 mg/dL 103(H) 105(H) 101(H)  BUN 8 - 23 mg/dL _0 Creatinine 0.44 - 1.00 mg/dL 0.80 0.86 0.72  Sodium 135 - 145 mmol/L 139 142 139  Potassium 3.5 - 5.1 mmol/L 4.1 3.8 4.5  Chloride 98 - 111 mmol/L 100 104 100  CO2 22 - 32 mmol/L _1 Calcium 8.9 - 10.3 mg/dL 9.8 9.9 10.0  Total Protein 6.5 - 8.1 g/dL 6.8 6.7 6.7  Total Bilirubin 0.3 - 1.2 mg/dL 0.8 0.8 0.8  Alkaline Phos 38 - 126 U/L 53 50 47  AST 15 - 41 U/L _2 ALT 0 - 44 U/L _3 . Lab Results  Component Value Date   LDH 220 (H) 08/25/2021      03/03/2020 FISH (CLL Prognosis) Panel:     RADIOGRAPHIC STUDIES: I have personally reviewed the radiological images as listed and agreed with the findings in the report.  CT CHEST ABDOMEN PELVIS W CONTRAST  Result Date: 09/19/2021 CLINICAL DATA:  SLL/CLL, assess treatment response immunotherapy EXAM: CT CHEST, ABDOMEN, AND PELVIS WITH CONTRAST TECHNIQUE: Multidetector CT imaging of the chest, abdomen and pelvis was performed following the standard protocol during bolus administration of intravenous contrast. CONTRAST:  60m OMNIPAQUE IOHEXOL 350 MG/ML SOLN, additional oral enteric contrast COMPARISON:  02/17/2021 FINDINGS: CT CHEST FINDINGS Cardiovascular: Aortic atherosclerosis. Normal heart size. No pericardial effusion. Mediastinum/Nodes: No enlarged mediastinal, hilar, or axillary lymph nodes. Thyroid gland, trachea, and esophagus demonstrate no significant findings. Lungs/Pleura: Stable, benign 0.3 cm nodule of the right  upper lobe  (series 4, image 51). No pleural effusion or pneumothorax. Musculoskeletal: No chest wall mass or suspicious bone lesions identified. CT ABDOMEN PELVIS FINDINGS Hepatobiliary: No focal liver abnormality is seen. Status post cholecystectomy. No biliary dilatation. Pancreas: Unremarkable. No pancreatic ductal dilatation or surrounding inflammatory changes. Spleen: Normal in size without significant abnormality. Adrenals/Urinary Tract: Adrenal glands are unremarkable. Kidneys are normal, without renal calculi, solid lesion, or hydronephrosis. Bladder is unremarkable. Stomach/Bowel: Stomach is within normal limits. Appendix appears normal. No evidence of bowel wall thickening, distention, or inflammatory changes. Vascular/Lymphatic: No significant vascular findings are present. No enlarged abdominal or pelvic lymph nodes. Reproductive: No mass or other abnormality. Other: No abdominal wall hernia or abnormality. No abdominopelvic ascites. Musculoskeletal: No acute or significant osseous findings. IMPRESSION: 1. No evidence of persistent lymphadenopathy in the chest, abdomen, or pelvis. 2. Normal spleen. 3. Status post cholecystectomy. Aortic Atherosclerosis (ICD10-I70.0). Electronically Signed   By: Delanna Ahmadi M.D.   On: 09/19/2021 14:53     ASSESSMENT & PLAN:   65 yo with   1) Rai Stage 1 Chronic lymphocytic Leukemia. Found to have trisomy 12 and 13q deletion via 03/03/2020 FISH (CLL Prognosis) Panel  Patient had presented with increasing dyspnea and was found to have CLL involvement of her airways.  Her dyspnea has completely resolved at this time. PLAN: -Patient notes no new toxicities from her venetoclax. -No new symptoms suggestive of CLL progression. -Discussed CT chest abdomen pelvis results in detail which show no evidence of persistent or progressive lymphadenopathy or hepatosplenomegaly. -Her last labs from 08/25/2021 showed completely normal CBC. -She has completed nearly 12 months of  venetoclax and will complete this month venetoclax and we discussed that holding this since she completed the planned regimen with Gazyva and venetoclax and appears to be in complete remission at this time. -We discussed switching to active surveillance and we will see her back in about 3 months with labs and H&P to continue to monitor her CLL.  Is stable might potentially spread out clinic visits further. -She will continue to follow-up with her primary care physician to stay up-to-date with her age-appropriate vaccinations.  2) history of neoplasm of left breast, primary tumor staging category Tis: lobular carcinoma in situ (LCIS) Left breast LCIS status post lumpectomy 10/06/2014; Started tamoxifen for breast cancer risk reduction 20 mg daily 11/18/2014 , decrease to 10 mg on 11/22/2017 completed February 2021.   Breast cancer surveillance:  Mammogram and ultrasound [facility]/20/2019 [facility]: Benign breast density category B Breast exam 02/23/2020: Benign Plan: -continue breast self examinations -Follow-up for yearly mammograms with primary care physician   FOLLOW UP: Return to clinic with Dr. Irene Limbo with labs in 3 months   . The total time spent in the appointment was 20 minutes and more than 50% was on counseling and direct patient cares.     All of the patient's questions were answered with apparent satisfaction. The patient knows to call the clinic with any problems, questions or concerns.    Sullivan Lone MD [place] AAHIVMS [facility] Hematology/Oncology Physician [facility]

## 2021-11-01 ENCOUNTER — Ambulatory Visit: Payer: 59 | Admitting: Podiatry

## 2021-11-02 NOTE — Addendum Note (Signed)
Addended by: Tora Kindred on: 11/02/2021 02:55 PM   Modules accepted: Orders

## 2021-11-17 ENCOUNTER — Ambulatory Visit: Payer: 59 | Admitting: Podiatry

## 2021-11-22 ENCOUNTER — Other Ambulatory Visit: Payer: Self-pay

## 2021-11-22 ENCOUNTER — Ambulatory Visit (INDEPENDENT_AMBULATORY_CARE_PROVIDER_SITE_OTHER): Payer: 59 | Admitting: Podiatry

## 2021-11-22 ENCOUNTER — Encounter: Payer: Self-pay | Admitting: Podiatry

## 2021-11-22 DIAGNOSIS — M2041 Other hammer toe(s) (acquired), right foot: Secondary | ICD-10-CM | POA: Diagnosis not present

## 2021-11-22 DIAGNOSIS — M19079 Primary osteoarthritis, unspecified ankle and foot: Secondary | ICD-10-CM

## 2021-11-22 DIAGNOSIS — M2042 Other hammer toe(s) (acquired), left foot: Secondary | ICD-10-CM

## 2021-11-22 DIAGNOSIS — M2010 Hallux valgus (acquired), unspecified foot: Secondary | ICD-10-CM

## 2021-11-27 NOTE — Progress Notes (Signed)
°  Subjective:  Patient ID: Colleen Shannon, female    DOB: [birth year],  MRN: [id number]  Chief Complaint  Patient presents with   Results    MRI - consult with [name] and [name]    66 y.o. female presents with the above complaint. History confirmed with patient.  She is referred to me and I am seeing her in consultation today with Dr. [name] for her left foot problem.  She has midfoot pain in the arch which is collapse as well as a bunion that is painful.  She also has a toe that is lifting off and sticks up above the other toe.  Objective:  Physical Exam: warm, good capillary refill, no trophic changes or ulcerative lesions, normal DP and PT pulses, and normal sensory exam. Left Foot:   Radiographs: Multiple views x-ray of the left foot:  Show osteoarthritis and fault at the navicular cuneiform joint with hallux valgus deformity as well as a long second metatarsal  IMPRESSION: 1. A 9 x 8 mm osteochondral lesion involving the medial corner of the talar dome with partial-thickness overlying cartilage loss and mild subchondral reactive marrow edema. 2. Mild osteoarthritis of the first MTP joint. 3. Moderate-severe osteoarthritis of the navicular-medial cuneiform joint with subchondral reactive marrow edema.     Electronically Signed   By: Kathreen Devoid M.D.   On: 09/17/2021 14:40    Assessment:   1. Arthritis of midfoot   2. Acquired hallux valgus, unspecified laterality   3. Hammer toes of both feet      Plan:  Patient was evaluated and treated and all questions answered.  I saw the patient today in consultation with Dr. [name].  We reviewed further treatment options including surgical reconstruction.  I think she will likely require midfoot fusion as well as a Lapidus bunionectomy and Weil osteotomy of the second ray with possible plantar plate repair to anchor the second toe prevent her from being as floppy as it is.  Difficult to elucidate the difference between the  talonavicular and navicular cuneiform pain for her.  She has had an injection as there before and is working on getting her records for this.  I will see her back for surgical planning visit she is planning for surgery later this fall.  In the meantime I will order a CT of her foot to evaluate the navicular cuneiform as well as the talonavicular joint to determine which of these should be fused.  There is evidence of this on her MRI but due to the resolution of difficult to see the complete cartilage across the talonavicular joint.  I will see her back after this.  No follow-ups on file.

## 2021-12-19 ENCOUNTER — Ambulatory Visit
Admission: RE | Admit: 2021-12-19 | Discharge: 2021-12-19 | Disposition: A | Discharge status: Home / Self Care | Payer: 59 | Source: Ambulatory Visit | Attending: Podiatry | Admitting: Podiatry

## 2021-12-19 DIAGNOSIS — M19079 Primary osteoarthritis, unspecified ankle and foot: Secondary | ICD-10-CM

## 2021-12-21 ENCOUNTER — Inpatient Hospital Stay: Payer: 59

## 2021-12-21 ENCOUNTER — Other Ambulatory Visit: Payer: Self-pay

## 2021-12-21 ENCOUNTER — Inpatient Hospital Stay: Payer: 59 | Attending: Hematology and Oncology | Admitting: Hematology

## 2021-12-21 VITALS — BP 115/63 | HR 72 | Temp 97.3°F | Resp 18 | Wt 149.2 lb

## 2021-12-21 DIAGNOSIS — Z853 Personal history of malignant neoplasm of breast: Secondary | ICD-10-CM | POA: Diagnosis not present

## 2021-12-21 DIAGNOSIS — C9110 Chronic lymphocytic leukemia of B-cell type not having achieved remission: Secondary | ICD-10-CM

## 2021-12-21 LAB — LACTATE DEHYDROGENASE: LDH: 169 U/L (ref 98–192)

## 2021-12-21 LAB — CMP (CANCER CENTER ONLY)
ALT: 19 U/L (ref 0–44)
AST: 18 U/L (ref 15–41)
Albumin: 4.5 g/dL (ref 3.5–5.0)
Alkaline Phosphatase: 47 U/L (ref 38–126)
Anion gap: 7 (ref 5–15)
BUN: 20 mg/dL (ref 8–23)
CO2: 31 mmol/L (ref 22–32)
Calcium: 9.5 mg/dL (ref 8.9–10.3)
Chloride: 102 mmol/L (ref 98–111)
Creatinine: 0.72 mg/dL (ref 0.44–1.00)
GFR, Estimated: >60 mL/min (ref >60)
Glucose, Bld: 97 mg/dL (ref 70–99)
Potassium: 3.8 mmol/L (ref 3.5–5.1)
Sodium: 140 mmol/L (ref 135–145)
Total Bilirubin: 0.5 mg/dL (ref 0.3–1.2)
Total Protein: 6.8 g/dL (ref 6.5–8.1)

## 2021-12-21 LAB — CBC WITH DIFFERENTIAL (CANCER CENTER ONLY)
Abs Immature Granulocytes: 0.01 10*3/uL (ref 0.00–0.07)
Basophils Absolute: 0 10*3/uL (ref 0.0–0.1)
Basophils Relative: 1 %
Eosinophils Absolute: 0.2 10*3/uL (ref 0.0–0.5)
Eosinophils Relative: 5 %
HCT: 39.8 % (ref 36.0–46.0)
Hemoglobin: 13.5 g/dL (ref 12.0–15.0)
Immature Granulocytes: 0 %
Lymphocytes Relative: 28 %
Lymphs Abs: 1 10*3/uL (ref 0.7–4.0)
MCH: 30.7 pg (ref 26.0–34.0)
MCHC: 33.9 g/dL (ref 30.0–36.0)
MCV: 90.5 fL (ref 80.0–100.0)
Monocytes Absolute: 0.4 10*3/uL (ref 0.1–1.0)
Monocytes Relative: 11 %
Neutro Abs: 2 10*3/uL (ref 1.7–7.7)
Neutrophils Relative %: 55 %
Platelet Count: 220 10*3/uL (ref 150–400)
RBC: 4.4 MIL/uL (ref 3.87–5.11)
RDW: 13.2 % (ref 11.5–15.5)
WBC Count: 3.6 10*3/uL — ABNORMAL LOW (ref 4.0–10.5)
nRBC: 0 % (ref 0.0–0.2)

## 2021-12-22 ENCOUNTER — Telehealth: Payer: Self-pay | Admitting: Hematology

## 2021-12-22 NOTE — Telephone Encounter (Signed)
Scheduled appointment per 03/15  los.Patient aware. ?

## 2021-12-27 NOTE — Progress Notes (Signed)
? ? ?HEMATOLOGY/ONCOLOGY PHONE VISIT NOTE ? ?Date of Service: .12/21/2021 ? ? ?Patient Care Team: ?Carol Ada, MD as PCP - General (Family Medicine) ?Troy Sine, MD as PCP - Cardiology (Cardiology) ? ?CHIEF COMPLAINTS/PURPOSE OF CONSULTATION:  ?Follow-up for continued valuation and management of CLL ? ?HISTORY OF PRESENTING ILLNESS:  ?Please see previous note for details on initial presentation ? ?INTERVAL HISTORY: ?Patient is here for continued valuation and management of CLL and follow-up since her last clinic visit about 4 months ago. ?She notes no acute new symptoms. ?Still having some issues with foot pain related to arthritis but wants to hold off on surgery currently. ?No fevers no chills no night sweats no unexpected weight loss. ?No new lumps or bumps. ?No other acute new focal symptoms. ? ?Labs done today were reviewed in detail ?MEDICAL HISTORY:  ?Past Medical History:  ?Diagnosis Date  ? Arthritis   ? Breast CA ([place]) dx'd 2015  ? left  ? Bronchiectasis ([place])   ? Cancer [facility])   ? hx of skin cancer   ? CLL (chronic lymphocytic leukemia) ([place]) dx'd 2015  ? Dyspnea   ? Dysrhythmia   ? PVCs  ? Elevated cholesterol 05/22/2012  ? Nuc stress test-Normal.  ? Endometrial polyp   ? GERD (gastroesophageal reflux disease)   ? Grade I diastolic dysfunction   ? noted on echo  ? History of palpitations   ? Hypertension 05/22/12  ? ECHO-EF >55% trace tricupsid regurgitation. There is mild mitral regurgitation.   ? MR (mitral regurgitation)   ? mild noted on echo  ? Plantar fasciitis of left foot   ? resolved  ? Pneumonia   ? history of x 2  ? PONV (postoperative nausea and vomiting)   ? ? ?SURGICAL HISTORY: ?Past Surgical History:  ?Procedure Laterality Date  ? ABDOMINAL SURGERY    ? Abdominoplasty  ? BRONCHIAL BIOPSY  06/29/2020  ? Procedure: BRONCHIAL BIOPSIES;  Surgeon: Collene Gobble, MD;  Location: [facility] ENDOSCOPY;  Service: Cardiopulmonary;;  ? BRONCHIAL BRUSHINGS  06/29/2020  ? Procedure: BRONCHIAL  BRUSHINGS;  Surgeon: Collene Gobble, MD;  Location: [facility] ENDOSCOPY;  Service: Cardiopulmonary;;  ? BRONCHIAL WASHINGS  06/29/2020  ? Procedure: BRONCHIAL WASHINGS;  Surgeon: Collene Gobble, MD;  Location: [facility] ENDOSCOPY;  Service: Cardiopulmonary;;  ? CESAREAN SECTION    ? X 2  ? CHOLECYSTECTOMY    ? COLONSCOPY    ? DILATION AND CURETTAGE OF UTERUS    ? FOOT SURGERY Left   ? HYSTEROSCOPY    ? KNEE ARTHROTOMY Right 06/04/2019  ? Procedure: Right knee arthrotomy; scar excision;  Surgeon: [name], MD;  Location: [facility] ORS;  Service: Orthopedics;  Laterality: Right;  46mn  ? KNEE CLOSED REDUCTION Right 07/20/2014  ? Procedure: RIGHT KNEE CLOSED MANIPULATION;  Surgeon: FGearlean Alf MD;  Location: [facility] ORS;  Service: Orthopedics;  Laterality: Right;  ? KNEE SURGERY Right   ? arthroscopic  ? PARTIAL KNEE ARTHROPLASTY Left 2017  ? RADIOACTIVE SEED GUIDED EXCISIONAL BREAST BIOPSY N/A 10/06/2014  ? Procedure: RADIOACTIVE SEED GUIDED EXCISIONAL BREAST BIOPSY;  Surgeon: MRolm Bookbinder MD;  Location: [facility]  Service: General;  Laterality: N/A;  ? REFRACTIVE SURGERY    ? TOTAL KNEE ARTHROPLASTY Right 05/11/2014  ? Procedure: RIGHT TOTAL KNEE ARTHROPLASTY;  Surgeon: FGearlean Alf MD;  Location: [facility] ORS;  Service: Orthopedics;  Laterality: Right;  ? TUBAL LIGATION    ? VIDEO BRONCHOSCOPY N/A 06/29/2020  ? Procedure: VIDEO BRONCHOSCOPY WITHOUT  FLUORO;  Surgeon: Collene Gobble, MD;  Location: [facility] ENDOSCOPY;  Service: Cardiopulmonary;  Laterality: N/A;  with BAL  ? ? ?SOCIAL HISTORY: ?Social History  ? ?Socioeconomic History  ? Marital status: Married  ?  Spouse name: Not on file  ? Number of children: Not on file  ? Years of education: Not on file  ? Highest education level: Not on file  ?Occupational History  ? Not on file  ?Tobacco Use  ? Smoking status: Never  ? Smokeless tobacco: Never  ?Vaping Use  ? Vaping Use: Never used  ?Substance and Sexual Activity  ? Alcohol use: Yes  ?  Comment: occasional   ?  Drug use: No  ? Sexual activity: Yes  ?  Birth control/protection: Surgical, Post-menopausal  ?  Comment: Hysterectomy  ?Other Topics Concern  ? Not on file  ?Social History Narrative  ? Not on file  ? ?Social Determinants of Health  ? ?Financial Resource Strain: Not on file  ?Food Insecurity: Not on file  ?Transportation Needs: Not on file  ?Physical Activity: Not on file  ?Stress: Not on file  ?Social Connections: Not on file  ?Intimate Partner Violence: Not on file  ? ? ?FAMILY HISTORY: ?Family History  ?Problem Relation Age of Onset  ? Heart disease Father   ? Dementia Father   ? Aneurysm Mother   ? Hyperlipidemia Mother   ? Alzheimer's disease Mother   ? Osteoarthritis Mother   ? Hypertension Brother   ? High Cholesterol Brother   ? Cancer - Other Paternal Grandmother   ?     uterine  ? Hypertension Paternal Grandfather   ? Stroke Paternal Grandfather   ? Heart disease Maternal Grandfather   ? Cancer - Other Maternal Grandfather   ?     lung  ? Cushing syndrome Brother   ? Healthy Son   ? Healthy Daughter   ? ? ?ALLERGIES:  has No Known Allergies. ? ?MEDICATIONS:  ?Current Outpatient Medications  ?Medication Sig Dispense Refill  ? albuterol (VENTOLIN HFA) 108 (90 Base) MCG/ACT inhaler Inhale 1-2 puffs into the lungs every 6 (six) hours as needed for wheezing or shortness of breath.    ? amLODipine (NORVASC) 5 MG tablet TAKE 1 TABLET BY MOUTH AT  BEDTIME 90 tablet 3  ? aspirin EC 81 MG tablet SMARTSIG:1 By Mouth    ? atorvastatin (LIPITOR) 40 MG tablet Take by mouth.    ? B Complex Vitamins (B COMPLEX 1 PO)     ? BESIVANCE 0.6 % SUSP Place 1 drop into the left eye 3 (three) times daily.    ? Calcium Carb-Cholecalciferol (CALCIUM-VITAMIN D3) 600-400 MG-UNIT TABS Take 1 tablet by mouth 2 (two) times daily.    ? carvedilol (COREG) 6.25 MG tablet Take 1 tablet (6.25 mg total) by mouth 2 (two) times daily with a meal. 180 tablet 1  ? cholecalciferol (VITAMIN D3) 25 MCG (1000 UNIT) tablet Take 1,000 Units by mouth in  the morning and at bedtime.    ? cloNIDine (CATAPRES) 0.1 MG tablet     ? COVID-19 mRNA bivalent vaccine, Pfizer, (PFIZER COVID-19 VAC BIVALENT) injection Inject into the muscle. 0.3 mL 0  ? Cyanocobalamin (B-12) 2500 MCG TABS Take 2,500 mcg by mouth daily.    ? EFUDEX 5 % cream Apply topically.    ? estradiol (ESTRACE) 0.1 MG/GM vaginal cream     ? ezetimibe (ZETIA) 10 MG tablet TAKE 1 TABLET BY MOUTH AT  BEDTIME 90 tablet  3  ? famotidine (PEPCID) 40 MG tablet 1 tab(s) orally 1-2 times a day for 30 days    ? Glucos-Chond-Sterol-Fish Oil (GLUCOSAMINE CHONDROITIN PLUS) CAPS     ? ketoconazole (NIZORAL) 2 % cream Apply 1 application topically 4 (four) times daily.    ? Multiple Vitamin (MULTIVITAMIN WITH MINERALS) TABS tablet Take 1 tablet by mouth daily.    ? PROLENSA 0.07 % SOLN SMARTSIG:1 Drop(s) Left Eye Every Evening    ? tamoxifen (NOLVADEX) 20 MG tablet SMARTSIG:1 By Mouth    ? ?No current facility-administered medications for this visit.  ? ? ?REVIEW OF SYSTEMS:   ?10 Point review of Systems was done is negative except as noted above. ? ?PHYSICAL EXAMINATION:  ?.BP 115/63   Pulse 72   Temp (!) 97.3 ?F (36.3 ?C)   Resp 18   Wt 149 lb 3.2 oz (67.7 kg)   LMP 11/19/2011 (LMP Unknown)   SpO2 96%   BMI 28.19 kg/m?  ?NAD ?GENERAL:alert, in no acute distress and comfortable ?SKIN: no acute rashes, no significant lesions ?EYES: conjunctiva are pink and non-injected, sclera anicteric ?OROPHARYNX: MMM, no exudates, no oropharyngeal erythema or ulceration ?NECK: supple, no JVD ?LYMPH:  no palpable lymphadenopathy in the cervical, axillary or inguinal regions ?LUNGS: clear to auscultation b/l with normal respiratory effort ?HEART: regular rate & rhythm ?ABDOMEN:  normoactive bowel sounds , non tender, not distended. ?Extremity: no pedal edema ?PSYCH: alert & oriented x 3 with fluent speech ?NEURO: no focal motor/sensory deficits ? ? ?LABORATORY DATA:  ? ? ?. ?CBC Latest Ref Rng & Units 12/21/2021 08/25/2021 06/28/2021   ?WBC 4.0 - 10.5 K/uL 3.6(L) 4.3 5.4  ?Hemoglobin 12.0 - 15.0 g/dL 13.5 13.2 13.1  ?Hematocrit 36.0 - 46.0 % 39.8 37.6 38.0  ?Platelets 150 - 400 K/uL 220 180 172  ? ? ?. ?CMP Latest Ref Rng & Units 12/21/2021 11/17/2

## 2022-01-02 ENCOUNTER — Encounter: Payer: Self-pay | Admitting: Podiatry

## 2022-01-12 ENCOUNTER — Encounter: Payer: Self-pay | Admitting: Podiatry

## 2022-01-12 ENCOUNTER — Ambulatory Visit: Payer: 59 | Admitting: Podiatry

## 2022-01-12 DIAGNOSIS — M19071 Primary osteoarthritis, right ankle and foot: Secondary | ICD-10-CM | POA: Diagnosis not present

## 2022-01-12 DIAGNOSIS — M19079 Primary osteoarthritis, unspecified ankle and foot: Secondary | ICD-10-CM | POA: Diagnosis not present

## 2022-01-12 MED ORDER — TRIAMCINOLONE ACETONIDE 40 MG/ML IJ SUSP
20.0000 mg | Freq: Once | INTRAMUSCULAR | Status: AC
  Administered 2022-01-12: 20 mg

## 2022-01-14 NOTE — Progress Notes (Signed)
She presents today states that is my right foot does bother me now I just wanted to see if I could get his shot in the top of it because this does bother me so much I am going to have to have surgery on the left 1 in the fall and I would like to try to get by with this point. ? ?Objective: Vital signs are stable she is alert and oriented x3.  Pulses are palpable.  Neurologic sensorium is intact Deetjen reflexes are intact muscle strength is symmetrical.  She has pain on palpation of the tarsometatarsal joints and midtarsal joints of the right foot and medially near the tibialis anterior tendon attachment as well as the posterior tibial tendon attachment in that vicinity.  There is tenderness on dorsiflexion and inversion as well as plantarflexion and inversion. ? ?Assessment: Capsulitis medial foot tibialis anterior tendinitis posterior tibial tendinitis. ? ?Plan: I injected the areas today with Kenalog and local anesthetic make sure not to inject into the joint.  Making sure not to inject in directly into the tendon itself.  I will follow-up with her in a couple of weeks or longer if she is able to. ?

## 2022-01-25 ENCOUNTER — Encounter: Payer: Self-pay | Admitting: Cardiovascular Disease

## 2022-01-25 MED ORDER — AMLODIPINE BESYLATE 5 MG PO TABS: 5.0000 mg | ORAL_TABLET | Freq: Every day | ORAL | 0 refills | Status: DC

## 2022-01-25 MED ORDER — AMLODIPINE BESYLATE 5 MG PO TABS: 5.0000 mg | ORAL_TABLET | Freq: Every day | ORAL | 2 refills | Status: DC

## 2022-02-28 ENCOUNTER — Ambulatory Visit: Payer: 59 | Admitting: Podiatry

## 2022-03-14 ENCOUNTER — Ambulatory Visit: Payer: 59 | Admitting: Podiatry

## 2022-04-04 ENCOUNTER — Ambulatory Visit: Payer: 59 | Admitting: Podiatry

## 2022-04-07 ENCOUNTER — Other Ambulatory Visit (HOSPITAL_COMMUNITY): Payer: Self-pay | Admitting: Orthopedic Surgery

## 2022-04-07 ENCOUNTER — Other Ambulatory Visit: Payer: Self-pay | Admitting: Orthopedic Surgery

## 2022-04-07 DIAGNOSIS — Z96651 Presence of right artificial knee joint: Secondary | ICD-10-CM

## 2022-04-13 ENCOUNTER — Ambulatory Visit: Payer: 59 | Admitting: Podiatry

## 2022-04-20 ENCOUNTER — Telehealth: Payer: Self-pay | Admitting: Hematology

## 2022-04-20 NOTE — Telephone Encounter (Signed)
Left message with rescheduled upcoming appointment due to provider's template. 

## 2022-04-24 ENCOUNTER — Ambulatory Visit: Payer: 59 | Admitting: Podiatry

## 2022-04-27 ENCOUNTER — Other Ambulatory Visit: Payer: Self-pay | Admitting: Cardiovascular Disease

## 2022-04-28 ENCOUNTER — Encounter (HOSPITAL_COMMUNITY): Payer: 59

## 2022-04-28 ENCOUNTER — Encounter (HOSPITAL_COMMUNITY): Payer: Self-pay

## 2022-05-03 ENCOUNTER — Encounter: Payer: Self-pay | Admitting: Cardiovascular Disease

## 2022-05-03 ENCOUNTER — Ambulatory Visit (HOSPITAL_COMMUNITY): Payer: 59

## 2022-05-03 ENCOUNTER — Encounter (HOSPITAL_COMMUNITY): Payer: Self-pay

## 2022-05-03 ENCOUNTER — Encounter (HOSPITAL_COMMUNITY): Payer: 59

## 2022-05-03 MED ORDER — CARVEDILOL 6.25 MG PO TABS: 6.2500 mg | ORAL_TABLET | Freq: Two times a day (BID) | ORAL | 3 refills | Status: DC

## 2022-05-04 ENCOUNTER — Ambulatory Visit: Payer: 59 | Admitting: Podiatry

## 2022-05-08 ENCOUNTER — Encounter: Payer: Self-pay | Admitting: Cardiovascular Disease

## 2022-05-08 MED ORDER — ATORVASTATIN CALCIUM 20 MG PO TABS: 20.0000 mg | ORAL_TABLET | Freq: Every day | ORAL | 1 refills | Status: DC

## 2022-05-08 MED ORDER — CARVEDILOL 12.5 MG PO TABS: 6.2500 mg | ORAL_TABLET | Freq: Two times a day (BID) | ORAL | 1 refills | Status: DC

## 2022-05-29 ENCOUNTER — Other Ambulatory Visit (HOSPITAL_COMMUNITY): Payer: Self-pay | Admitting: Orthopedic Surgery

## 2022-05-29 DIAGNOSIS — Z96651 Presence of right artificial knee joint: Secondary | ICD-10-CM

## 2022-06-13 DIAGNOSIS — L720 Epidermal cyst: Secondary | ICD-10-CM | POA: Diagnosis not present

## 2022-06-13 DIAGNOSIS — D2262 Melanocytic nevi of left upper limb, including shoulder: Secondary | ICD-10-CM | POA: Diagnosis not present

## 2022-06-13 DIAGNOSIS — Z85828 Personal history of other malignant neoplasm of skin: Secondary | ICD-10-CM | POA: Diagnosis not present

## 2022-06-13 DIAGNOSIS — D225 Melanocytic nevi of trunk: Secondary | ICD-10-CM | POA: Diagnosis not present

## 2022-06-14 ENCOUNTER — Encounter (HOSPITAL_COMMUNITY): Payer: 59

## 2022-06-14 ENCOUNTER — Encounter (HOSPITAL_COMMUNITY): Payer: Self-pay

## 2022-06-14 ENCOUNTER — Encounter: Payer: Self-pay | Admitting: Cardiovascular Disease

## 2022-06-15 ENCOUNTER — Ambulatory Visit: Payer: Medicare Other | Admitting: Podiatry

## 2022-06-15 DIAGNOSIS — M19071 Primary osteoarthritis, right ankle and foot: Secondary | ICD-10-CM

## 2022-06-15 DIAGNOSIS — M19079 Primary osteoarthritis, unspecified ankle and foot: Secondary | ICD-10-CM

## 2022-06-15 DIAGNOSIS — M654 Radial styloid tenosynovitis [de Quervain]: Secondary | ICD-10-CM | POA: Diagnosis not present

## 2022-06-19 ENCOUNTER — Encounter: Payer: Self-pay | Admitting: Cardiovascular Disease

## 2022-06-19 ENCOUNTER — Other Ambulatory Visit: Payer: Self-pay

## 2022-06-19 DIAGNOSIS — I10 Essential (primary) hypertension: Secondary | ICD-10-CM

## 2022-06-19 DIAGNOSIS — Z79899 Other long term (current) drug therapy: Secondary | ICD-10-CM

## 2022-06-19 MED ORDER — AMLODIPINE BESYLATE 5 MG PO TABS: 5.0000 mg | ORAL_TABLET | Freq: Every day | ORAL | 0 refills | Status: DC

## 2022-06-19 MED ORDER — EZETIMIBE 10 MG PO TABS: 10.0000 mg | ORAL_TABLET | Freq: Every day | ORAL | 1 refills | Status: AC

## 2022-06-19 MED ORDER — ATORVASTATIN CALCIUM 20 MG PO TABS: 20.0000 mg | ORAL_TABLET | Freq: Every day | ORAL | 1 refills | Status: DC

## 2022-06-19 MED ORDER — CARVEDILOL 12.5 MG PO TABS: 6.2500 mg | ORAL_TABLET | Freq: Two times a day (BID) | ORAL | 0 refills | Status: DC

## 2022-06-19 NOTE — Progress Notes (Signed)
  Subjective:  Patient ID: Colleen Shannon, female    DOB: [birth year],  MRN: [id number]  Chief Complaint  Patient presents with   Arthritis    right midfoot pain/ needs injection    66 y.o. female presents with the above complaint. History confirmed with patient.  She is referred to me and I am seeing her in consultation today with Dr. [name] for her left foot problem.  She has midfoot pain in the arch which is collapse as well as a bunion that is painful.  She also has a toe that is lifting off and sticks up above the other toe.  Interval history: Since last visit her left foot is doing better.  Her right foot is now her main problem she is having significant pain in the midfoot.  She saw Dr. [name] for this and April of this year and had an injection which was very helpful.  She is here today for further treatment and would like an injection of this. Objective:  Physical Exam: warm, good capillary refill, no trophic changes or ulcerative lesions, normal DP and PT pulses, and normal sensory exam. Left Foot: No pain to palpation today Right foot pain and palpation of the midfoot  Radiographs: Multiple views x-ray of the left foot:  Show osteoarthritis and fault at the navicular cuneiform joint with hallux valgus deformity as well as a long second metatarsal  IMPRESSION: 1. A 9 x 8 mm osteochondral lesion involving the medial corner of the talar dome with partial-thickness overlying cartilage loss and mild subchondral reactive marrow edema. 2. Mild osteoarthritis of the first MTP joint. 3. Moderate-severe osteoarthritis of the navicular-medial cuneiform joint with subchondral reactive marrow edema.     Electronically Signed   By: Kathreen Devoid M.D.   On: 09/17/2021 14:40    Assessment:   1. Arthritis of midfoot       Plan:  Patient was evaluated and treated and all questions answered.  Currently her left foot is not bothersome we will hold off on surgical intervention for  this, her right knee needs to be addressed we plan for this once that has been addressed.  Her right foot is now more problematic and more painful.  She has had relief from corticosteroid injection.  I reviewed her x-rays and she has significant arthritis of the medial naviculocuneiform joint.  I discussed with her surgical treatment of this at some point as well.  Today proceeded with corticosteroid injection following sterile prep with Betadine.  10 mg of Kenalog was injected directly into the naviculocuneiform joint from a dorsal medial approach.  Was able to approach this between the tibialis anterior and tibialis posterior tendons without entering either sheath.  She tolerated this well.  Return if symptoms worsen or fail to improve.

## 2022-06-21 ENCOUNTER — Other Ambulatory Visit (HOSPITAL_COMMUNITY): Payer: Self-pay

## 2022-06-21 ENCOUNTER — Telehealth: Payer: Self-pay | Admitting: Cardiovascular Disease

## 2022-06-21 MED ORDER — CARVEDILOL 12.5 MG PO TABS
6.2500 mg | ORAL_TABLET | Freq: Two times a day (BID) | ORAL | 0 refills | Status: DC
  Filled 2022-06-21: qty 90, 90d supply, fill #0

## 2022-06-21 MED ORDER — AMLODIPINE BESYLATE 5 MG PO TABS
5.0000 mg | ORAL_TABLET | Freq: Every day | ORAL | 0 refills | Status: DC
  Filled 2022-06-21: qty 90, 90d supply, fill #0

## 2022-06-21 NOTE — Telephone Encounter (Signed)
*  STAT* If patient is at the pharmacy, call can be transferred to refill team.   1. Which medications need to be refilled? (please list name of each medication and dose if known)   amLODipine (NORVASC) 5 MG tablet  carvedilol (COREG) 12.5 MG tablet  2. Which pharmacy/location (including street and city if local pharmacy) is medication to be sent to?  [facility] [place], [place]  3. Do they need a 30 day or 90 day supply? 90 day  Caller stated patient still has some medication left.

## 2022-06-22 ENCOUNTER — Other Ambulatory Visit: Payer: 59

## 2022-06-22 ENCOUNTER — Other Ambulatory Visit (HOSPITAL_COMMUNITY): Payer: Self-pay

## 2022-06-22 ENCOUNTER — Ambulatory Visit: Payer: 59 | Admitting: Podiatry

## 2022-06-22 ENCOUNTER — Ambulatory Visit: Payer: 59 | Admitting: Hematology

## 2022-06-22 ENCOUNTER — Telehealth: Payer: Self-pay | Admitting: Cardiovascular Disease

## 2022-06-22 MED ORDER — AMLODIPINE BESYLATE 5 MG PO TABS
5.0000 mg | ORAL_TABLET | Freq: Every day | ORAL | 0 refills | Status: DC
  Filled 2022-06-22: qty 90, 90d supply, fill #0

## 2022-06-22 MED ORDER — CARVEDILOL 12.5 MG PO TABS
6.2500 mg | ORAL_TABLET | Freq: Two times a day (BID) | ORAL | 0 refills | Status: DC
  Filled 2022-06-22: qty 90, 90d supply, fill #0

## 2022-06-22 NOTE — Telephone Encounter (Signed)
*  STAT* If patient is at the pharmacy, call can be transferred to refill team.   1. Which medications need to be refilled? (please list name of each medication and dose if known) new prescription for Carvedilol and Amlodipine[  2. Which pharmacy/location (including street and city if local pharmacy) is medication to be sent to?[facility]  3. Do they need a 30 day or 90 day supply? 90 days and refills

## 2022-06-22 NOTE — Addendum Note (Signed)
Addended by: Wonda Horner on: 06/22/2022 04:32 PM   Modules accepted: Orders

## 2022-06-23 ENCOUNTER — Other Ambulatory Visit: Payer: Self-pay

## 2022-06-23 ENCOUNTER — Other Ambulatory Visit (HOSPITAL_COMMUNITY): Payer: Self-pay

## 2022-06-23 ENCOUNTER — Inpatient Hospital Stay: Payer: Medicare Other | Attending: Hematology

## 2022-06-23 ENCOUNTER — Encounter: Payer: Self-pay | Admitting: Cardiovascular Disease

## 2022-06-23 ENCOUNTER — Inpatient Hospital Stay (HOSPITAL_BASED_OUTPATIENT_CLINIC_OR_DEPARTMENT_OTHER): Payer: Medicare Other | Admitting: Hematology

## 2022-06-23 VITALS — BP 138/67 | HR 67 | Temp 98.1°F | Resp 15 | Wt 151.8 lb

## 2022-06-23 DIAGNOSIS — C9110 Chronic lymphocytic leukemia of B-cell type not having achieved remission: Secondary | ICD-10-CM | POA: Insufficient documentation

## 2022-06-23 DIAGNOSIS — Z86000 Personal history of in-situ neoplasm of breast: Secondary | ICD-10-CM | POA: Diagnosis not present

## 2022-06-23 LAB — CBC WITH DIFFERENTIAL (CANCER CENTER ONLY)
Abs Immature Granulocytes: 0.02 10*3/uL (ref 0.00–0.07)
Basophils Absolute: 0 10*3/uL (ref 0.0–0.1)
Basophils Relative: 1 %
Eosinophils Absolute: 0.2 10*3/uL (ref 0.0–0.5)
Eosinophils Relative: 3 %
HCT: 40.6 % (ref 36.0–46.0)
Hemoglobin: 14 g/dL (ref 12.0–15.0)
Immature Granulocytes: 0 %
Lymphocytes Relative: 20 %
Lymphs Abs: 1.2 10*3/uL (ref 0.7–4.0)
MCH: 30.9 pg (ref 26.0–34.0)
MCHC: 34.5 g/dL (ref 30.0–36.0)
MCV: 89.6 fL (ref 80.0–100.0)
Monocytes Absolute: 0.6 10*3/uL (ref 0.1–1.0)
Monocytes Relative: 9 %
Neutro Abs: 4.2 10*3/uL (ref 1.7–7.7)
Neutrophils Relative %: 67 %
Platelet Count: 210 10*3/uL (ref 150–400)
RBC: 4.53 MIL/uL (ref 3.87–5.11)
RDW: 13.2 % (ref 11.5–15.5)
WBC Count: 6.3 10*3/uL (ref 4.0–10.5)
nRBC: 0 % (ref 0.0–0.2)

## 2022-06-23 LAB — CMP (CANCER CENTER ONLY)
ALT: 25 U/L (ref 0–44)
AST: 20 U/L (ref 15–41)
Albumin: 4.3 g/dL (ref 3.5–5.0)
Alkaline Phosphatase: 48 U/L (ref 38–126)
Anion gap: 7 (ref 5–15)
BUN: 19 mg/dL (ref 8–23)
CO2: 28 mmol/L (ref 22–32)
Calcium: 9.7 mg/dL (ref 8.9–10.3)
Chloride: 105 mmol/L (ref 98–111)
Creatinine: 0.54 mg/dL (ref 0.44–1.00)
GFR, Estimated: >60 mL/min (ref >60)
Glucose, Bld: 104 mg/dL — ABNORMAL HIGH (ref 70–99)
Potassium: 3.8 mmol/L (ref 3.5–5.1)
Sodium: 140 mmol/L (ref 135–145)
Total Bilirubin: 0.6 mg/dL (ref 0.3–1.2)
Total Protein: 6.8 g/dL (ref 6.5–8.1)

## 2022-06-23 LAB — LACTATE DEHYDROGENASE: LDH: 140 U/L (ref 98–192)

## 2022-06-23 MED ORDER — CARVEDILOL 12.5 MG PO TABS: 6.2500 mg | ORAL_TABLET | Freq: Two times a day (BID) | ORAL | 1 refills | Status: DC

## 2022-06-23 MED ORDER — AMLODIPINE BESYLATE 5 MG PO TABS: 5.0000 mg | ORAL_TABLET | Freq: Every day | ORAL | 1 refills | Status: AC

## 2022-06-29 DIAGNOSIS — K219 Gastro-esophageal reflux disease without esophagitis: Secondary | ICD-10-CM | POA: Diagnosis not present

## 2022-06-29 DIAGNOSIS — I10 Essential (primary) hypertension: Secondary | ICD-10-CM | POA: Diagnosis not present

## 2022-06-29 DIAGNOSIS — E669 Obesity, unspecified: Secondary | ICD-10-CM | POA: Diagnosis not present

## 2022-06-29 DIAGNOSIS — E782 Mixed hyperlipidemia: Secondary | ICD-10-CM | POA: Diagnosis not present

## 2022-06-29 NOTE — Progress Notes (Signed)
HEMATOLOGY/ONCOLOGY PHONE VISIT NOTE  Date of Service: .06/23/2022   Patient Care Team: Carol Ada, MD as PCP - General (Family Medicine) Troy Sine, MD as PCP - Cardiology (Cardiology)  CHIEF COMPLAINTS/PURPOSE OF CONSULTATION:  For continued evaluation and management of CLL  HISTORY OF PRESENTING ILLNESS:  Please see previous note for details on initial presentation  INTERVAL HISTORY:  [name] is here for continued evaluation and management of her CLL.  She notes no acute new concerns since her last clinic visit 6 months ago. She has had some issues with arthritis in her foot that might potentially need surgery. No new lumps or bumps.  No fevers no chills no night sweats.  No acute new fatigue or weight loss. No other acute new focal symptoms.  No shortness of breath.  Lab results from today were discussed in detail with the patient.   MEDICAL HISTORY:  Past Medical History:  Diagnosis Date   Arthritis    Breast CA ([place]) dx'd 2015   left   Bronchiectasis ([place])    Cancer ([place])    hx of skin cancer    CLL (chronic lymphocytic leukemia) ([place]) dx'd 2015   Dyspnea    Dysrhythmia    PVCs   Elevated cholesterol 05/22/2012   Nuc stress test-Normal.   Endometrial polyp    GERD (gastroesophageal reflux disease)    Grade I diastolic dysfunction    noted on echo   History of palpitations    Hypertension 05/22/12   ECHO-EF >55% trace tricupsid regurgitation. There is mild mitral regurgitation.    MR (mitral regurgitation)    mild noted on echo   Plantar fasciitis of left foot    resolved   Pneumonia    history of x 2   PONV (postoperative nausea and vomiting)     SURGICAL HISTORY: Past Surgical History:  Procedure Laterality Date   ABDOMINAL SURGERY     Abdominoplasty   BRONCHIAL BIOPSY  06/29/2020   Procedure: BRONCHIAL BIOPSIES;  Surgeon: Collene Gobble, MD;  Location: [facility];  Service: Cardiopulmonary;;   BRONCHIAL BRUSHINGS   06/29/2020   Procedure: BRONCHIAL BRUSHINGS;  Surgeon: Collene Gobble, MD;  Location: [place];  Service: Cardiopulmonary;;   BRONCHIAL WASHINGS  06/29/2020   Procedure: BRONCHIAL WASHINGS;  Surgeon: Collene Gobble, MD;  Location: [facility];  Service: Cardiopulmonary;;   CESAREAN SECTION     X 2   CHOLECYSTECTOMY     COLONSCOPY     DILATION AND CURETTAGE OF UTERUS     FOOT SURGERY Left    HYSTEROSCOPY     KNEE ARTHROTOMY Right 06/04/2019   Procedure: Right knee arthrotomy; scar excision;  Surgeon: [name], MD;  Location: [facility];  Service: Orthopedics;  Laterality: Right;  66mn   KNEE CLOSED REDUCTION Right 07/20/2014   Procedure: RIGHT KNEE CLOSED MANIPULATION;  Surgeon: FGearlean Alf MD;  Location: [facility];  Service: Orthopedics;  Laterality: Right;   KNEE SURGERY Right    arthroscopic   PARTIAL KNEE ARTHROPLASTY Left 2017   RADIOACTIVE SEED GUIDED EXCISIONAL BREAST BIOPSY N/A 10/06/2014   Procedure: RADIOACTIVE SEED GUIDED EXCISIONAL BREAST BIOPSY;  Surgeon: MRolm Bookbinder MD;  [place]  Service: General;  Laterality: N/A;   REFRACTIVE SURGERY     TOTAL KNEE ARTHROPLASTY Right 05/11/2014   Procedure: RIGHT TOTAL KNEE ARTHROPLASTY;  Surgeon: FGearlean Alf MD;  Location: [facility];  Service: Orthopedics;  Laterality: Right;   TUBAL LIGATION  VIDEO BRONCHOSCOPY N/A 06/29/2020   Procedure: VIDEO BRONCHOSCOPY WITHOUT FLUORO;  Surgeon: Collene Gobble, MD;  Location: [facility];  Service: Cardiopulmonary;  Laterality: N/A;  with BAL    SOCIAL HISTORY: Social History   Socioeconomic History   Marital status: Married    Spouse name: Not on file   Number of children: Not on file   Years of education: Not on file   Highest education level: Not on file  Occupational History   Not on file  Tobacco Use   Smoking status: Never   Smokeless tobacco: Never  Vaping Use   Vaping Use: Never used  Substance and Sexual Activity   Alcohol use:  Yes    Comment: occasional    Drug use: No   Sexual activity: Yes    Birth control/protection: Surgical, Post-menopausal    Comment: Hysterectomy  Other Topics Concern   Not on file  Social History Narrative   Not on file   Social Determinants of Health   Financial Resource Strain: Not on file  Food Insecurity: Not on file  Transportation Needs: Not on file  Physical Activity: Not on file  Stress: Not on file  Social Connections: Not on file  Intimate Partner Violence: Not on file    FAMILY HISTORY: Family History  Problem Relation Age of Onset   Heart disease Father    Dementia Father    Aneurysm Mother    Hyperlipidemia Mother    Alzheimer's disease Mother    Osteoarthritis Mother    Hypertension Brother    High Cholesterol Brother    Cancer - Other Paternal Grandmother        uterine   Hypertension Paternal Grandfather    Stroke Paternal Grandfather    Heart disease Maternal Grandfather    Cancer - Other Maternal Grandfather        lung   Cushing syndrome Brother    Healthy Son    Healthy Daughter     ALLERGIES:  has No Known Allergies.  MEDICATIONS:  Current Outpatient Medications  Medication Sig Dispense Refill   albuterol (VENTOLIN HFA) 108 (90 Base) MCG/ACT inhaler Inhale 1-2 puffs into the lungs every 6 (six) hours as needed for wheezing or shortness of breath.     atorvastatin (LIPITOR) 20 MG tablet Take 1 tablet (20 mg total) by mouth daily. 90 tablet 1   B Complex Vitamins (B COMPLEX 1 PO)      Calcium Carb-Cholecalciferol (CALCIUM-VITAMIN D3) 600-400 MG-UNIT TABS Take 1 tablet by mouth 2 (two) times daily.     cholecalciferol (VITAMIN D3) 25 MCG (1000 UNIT) tablet Take 1,000 Units by mouth in the morning and at bedtime.     Cyanocobalamin (B-12) 2500 MCG TABS Take 2,500 mcg by mouth daily.     ezetimibe (ZETIA) 10 MG tablet Take 1 tablet (10 mg total) by mouth at bedtime. 90 tablet 1   famotidine (PEPCID) 40 MG tablet 1 tab(s) orally 1-2 times a  day for 30 days     Multiple Vitamin (MULTIVITAMIN WITH MINERALS) TABS tablet Take 1 tablet by mouth daily.     amLODipine (NORVASC) 5 MG tablet Take 1 tablet (5 mg total) by mouth at bedtime. 90 tablet 1   carvedilol (COREG) 12.5 MG tablet Take 0.5 tablets (6.25 mg total) by mouth 2 (two) times daily with a meal. 90 tablet 1   COVID-19 mRNA bivalent vaccine, Pfizer, (PFIZER COVID-19 VAC BIVALENT) injection Inject into the muscle. 0.3 mL 0   No current  facility-administered medications for this visit.    REVIEW OF SYSTEMS:   10 Point review of Systems was done is negative except as noted above.  PHYSICAL EXAMINATION:  .BP 138/67 (BP Location: Left Arm, Patient Position: Sitting)   Pulse 67   Temp 98.1 F (36.7 C) (Temporal)   Resp 15   Wt 151 lb 12.8 oz (68.9 kg)   LMP 11/19/2011 (LMP Unknown)   SpO2 97%   BMI 28.68 kg/m  NAD GENERAL:alert, in no acute distress and comfortable SKIN: no acute rashes, no significant lesions EYES: conjunctiva are pink and non-injected, sclera anicteric OROPHARYNX: MMM, no exudates, no oropharyngeal erythema or ulceration NECK: supple, no JVD LYMPH:  no palpable lymphadenopathy in the cervical, axillary or inguinal regions LUNGS: clear to auscultation b/l with normal respiratory effort HEART: regular rate & rhythm ABDOMEN:  normoactive bowel sounds , non tender, not distended. Extremity: no pedal edema PSYCH: alert & oriented x 3 with fluent speech NEURO: no focal motor/sensory deficits   LABORATORY DATA:    .    Latest Ref Rng & Units 06/23/2022   10:16 AM 12/21/2021    8:48 AM 08/25/2021    1:49 PM  CBC  WBC 4.0 - 10.5 K/uL 6.3  3.6  4.3   Hemoglobin 12.0 - 15.0 g/dL 14.0  13.5  13.2   Hematocrit 36.0 - 46.0 % 40.6  39.8  37.6   Platelets 150 - 400 K/uL 210  220  180     .    Latest Ref Rng & Units 06/23/2022   10:16 AM 12/21/2021    8:48 AM 08/25/2021    1:49 PM  CMP  Glucose 70 - 99 mg/dL 104  97  103   BUN 8 - 23 mg/dL _0 Creatinine 0.44 - 1.00 mg/dL 0.54  0.72  0.80   Sodium 135 - 145 mmol/L 140  140  139   Potassium 3.5 - 5.1 mmol/L 3.8  3.8  4.1   Chloride 98 - 111 mmol/L 105  102  100   CO2 22 - 32 mmol/L _1 Calcium 8.9 - 10.3 mg/dL 9.7  9.5  9.8   Total Protein 6.5 - 8.1 g/dL 6.8  6.8  6.8   Total Bilirubin 0.3 - 1.2 mg/dL 0.6  0.5  0.8   Alkaline Phos 38 - 126 U/L 48  47  53   AST 15 - 41 U/L _2 ALT 0 - 44 U/L _3 . Lab Results  Component Value Date   LDH 140 06/23/2022      03/03/2020 FISH (CLL Prognosis) Panel:     RADIOGRAPHIC STUDIES: I have personally reviewed the radiological images as listed and agreed with the findings in the report.  CT CHEST ABDOMEN PELVIS W CONTRAST  Result Date: 09/19/2021 CLINICAL DATA:  SLL/CLL, assess treatment response immunotherapy EXAM: CT CHEST, ABDOMEN, AND PELVIS WITH CONTRAST TECHNIQUE: Multidetector CT imaging of the chest, abdomen and pelvis was performed following the standard protocol during bolus administration of intravenous contrast. CONTRAST:  59m OMNIPAQUE IOHEXOL 350 MG/ML SOLN, additional oral enteric contrast COMPARISON:  02/17/2021 FINDINGS: CT CHEST FINDINGS Cardiovascular: Aortic atherosclerosis. Normal heart size. No pericardial effusion. Mediastinum/Nodes: No enlarged mediastinal, hilar, or axillary lymph nodes. Thyroid gland, trachea, and esophagus demonstrate no significant findings. Lungs/Pleura: Stable, benign 0.3 cm nodule of the right upper lobe (series 4, image  51). No pleural effusion or pneumothorax. Musculoskeletal: No chest wall mass or suspicious bone lesions identified. CT ABDOMEN PELVIS FINDINGS Hepatobiliary: No focal liver abnormality is seen. Status post cholecystectomy. No biliary dilatation. Pancreas: Unremarkable. No pancreatic ductal dilatation or surrounding inflammatory changes. Spleen: Normal in size without significant abnormality. Adrenals/Urinary Tract: Adrenal glands are  unremarkable. Kidneys are normal, without renal calculi, solid lesion, or hydronephrosis. Bladder is unremarkable. Stomach/Bowel: Stomach is within normal limits. Appendix appears normal. No evidence of bowel wall thickening, distention, or inflammatory changes. Vascular/Lymphatic: No significant vascular findings are present. No enlarged abdominal or pelvic lymph nodes. Reproductive: No mass or other abnormality. Other: No abdominal wall hernia or abnormality. No abdominopelvic ascites. Musculoskeletal: No acute or significant osseous findings. IMPRESSION: 1. No evidence of persistent lymphadenopathy in the chest, abdomen, or pelvis. 2. Normal spleen. 3. Status post cholecystectomy. Aortic Atherosclerosis (ICD10-I70.0). Electronically Signed   By: Delanna Ahmadi M.D.   On: 09/19/2021 14:53     ASSESSMENT & PLAN:   66 yo with   1) Rai Stage 1 Chronic lymphocytic Leukemia. Found to have trisomy 12 and 13q deletion via 03/03/2020 FISH (CLL Prognosis) Panel  Patient had presented with increasing dyspnea and was found to have CLL involvement of her airways.  Her dyspnea has completely resolved at this time. PLAN: -Patient's labs from today were reviewed with her in detail.  CBC CMP stable LDH within normal limits Patient has no lab or clinical symptoms suggestive of CLL progression at this time No indication for additional treatment of CLL at this time We shall continue active surveillance.  2) history of neoplasm of left breast, primary tumor staging category Tis: lobular carcinoma in situ (LCIS) Left breast LCIS status post lumpectomy 10/06/2014; Started tamoxifen for breast cancer risk reduction 20 mg daily 11/18/2014 , decrease to 10 mg on 11/22/2017 completed February 2021.   Breast cancer surveillance:  Mammogram and ultrasound 05/20/2019 at [facility]: Benign breast density category B Breast exam 02/23/2020: Benign Plan: -continue breast self examinations -Follow-up for yearly mammograms with  primary care physician   FOLLOW UP: RTC with Dr Irene Limbo with labs in 6 months F/u with PCP for yearly Mammograms  The total time spent in the appointment was 20 minutes*.  All of the patient's questions were answered with apparent satisfaction. The patient knows to call the clinic with any problems, questions or concerns.   Sullivan Lone MD MS AAHIVMS [facility] Hematology/Oncology Physician [facility]  .*Total Encounter Time as defined by the Centers for Medicare and Medicaid Services includes, in addition to the face-to-face time of a patient visit (documented in the note above) non-face-to-face time: obtaining and reviewing outside history, ordering and reviewing medications, tests or procedures, care coordination (communications with other health care professionals or caregivers) and documentation in the medical record.

## 2022-06-30 ENCOUNTER — Other Ambulatory Visit (HOSPITAL_COMMUNITY): Payer: Self-pay | Admitting: Orthopedic Surgery

## 2022-06-30 DIAGNOSIS — Z96651 Presence of right artificial knee joint: Secondary | ICD-10-CM

## 2022-07-07 ENCOUNTER — Telehealth: Payer: Self-pay | Admitting: Hematology

## 2022-07-07 NOTE — Telephone Encounter (Signed)
Scheduled follow-up appointment per 9/15 los. Patient is aware.

## 2022-07-12 ENCOUNTER — Ambulatory Visit (HOSPITAL_COMMUNITY)
Admission: RE | Admit: 2022-07-12 | Discharge: 2022-07-12 | Disposition: A | Discharge status: Home / Self Care | Payer: Medicare Other | Source: Ambulatory Visit | Attending: Orthopedic Surgery | Admitting: Orthopedic Surgery

## 2022-07-12 ENCOUNTER — Encounter (HOSPITAL_COMMUNITY)
Admission: RE | Admit: 2022-07-12 | Discharge: 2022-07-12 | Disposition: A | Discharge status: Home / Self Care | Payer: Medicare Other | Source: Ambulatory Visit | Attending: Orthopedic Surgery | Admitting: Orthopedic Surgery

## 2022-07-12 DIAGNOSIS — Z96651 Presence of right artificial knee joint: Secondary | ICD-10-CM | POA: Diagnosis not present

## 2022-07-12 DIAGNOSIS — Z96642 Presence of left artificial hip joint: Secondary | ICD-10-CM | POA: Diagnosis not present

## 2022-07-12 DIAGNOSIS — M25561 Pain in right knee: Secondary | ICD-10-CM | POA: Diagnosis not present

## 2022-07-12 MED ORDER — TECHNETIUM TC 99M MEDRONATE IV KIT
20.0000 | PACK | Freq: Once | INTRAVENOUS | Status: AC | PRN
  Administered 2022-07-12: 20 via INTRAVENOUS

## 2022-07-20 DIAGNOSIS — Z96651 Presence of right artificial knee joint: Secondary | ICD-10-CM | POA: Diagnosis not present

## 2022-07-20 DIAGNOSIS — M25561 Pain in right knee: Secondary | ICD-10-CM | POA: Diagnosis not present

## 2022-07-27 DIAGNOSIS — M654 Radial styloid tenosynovitis [de Quervain]: Secondary | ICD-10-CM | POA: Diagnosis not present

## 2022-07-27 DIAGNOSIS — M1812 Unilateral primary osteoarthritis of first carpometacarpal joint, left hand: Secondary | ICD-10-CM | POA: Diagnosis not present

## 2022-08-09 DIAGNOSIS — Z124 Encounter for screening for malignant neoplasm of cervix: Secondary | ICD-10-CM | POA: Diagnosis not present

## 2022-08-09 DIAGNOSIS — Z6828 Body mass index (BMI) 28.0-28.9, adult: Secondary | ICD-10-CM | POA: Diagnosis not present

## 2022-08-09 DIAGNOSIS — R635 Abnormal weight gain: Secondary | ICD-10-CM | POA: Diagnosis not present

## 2022-08-09 DIAGNOSIS — Z01419 Encounter for gynecological examination (general) (routine) without abnormal findings: Secondary | ICD-10-CM | POA: Diagnosis not present

## 2022-08-13 IMAGING — CR DG CHEST 2V
2 series · 2 of 2 positions shown · non-contrast
Comparison: 07/05/2020

CLINICAL DATA: Fever, on chemotherapy for leukemia

EXAM:
CHEST - 2 VIEW

[w chest pa]
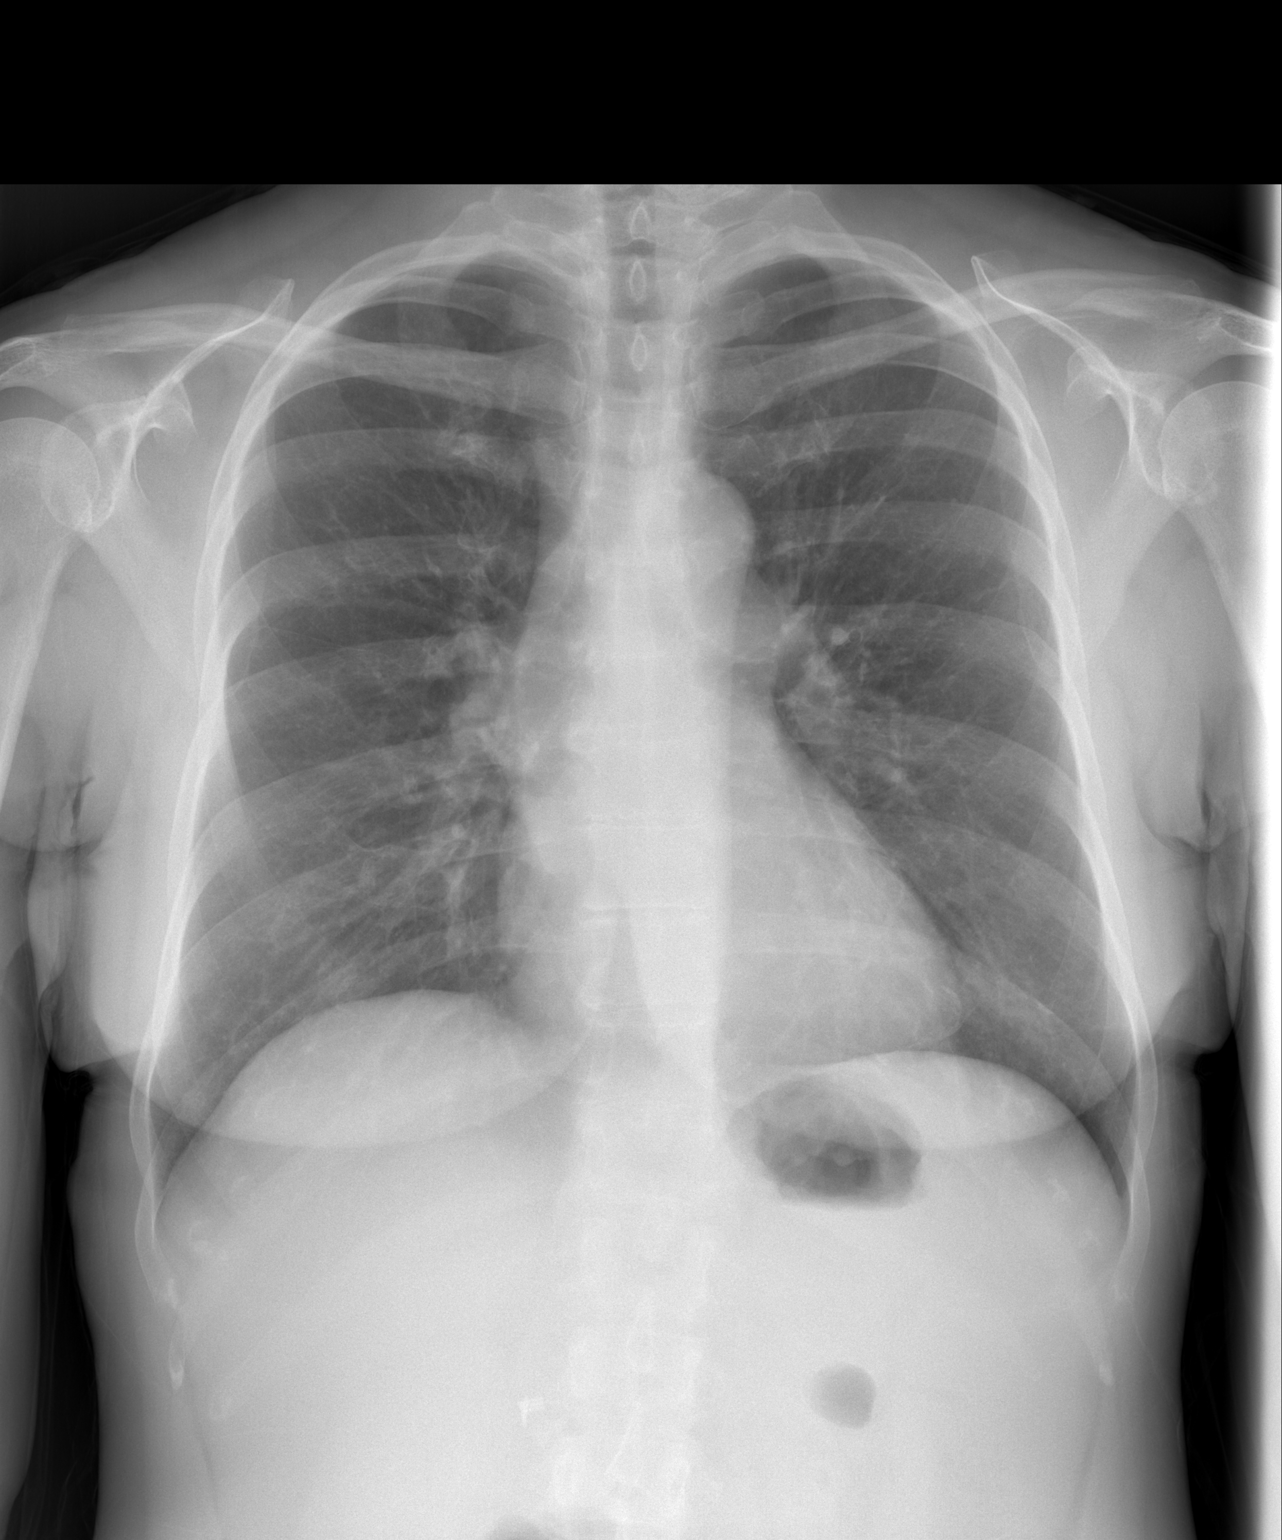

[w chest lat]
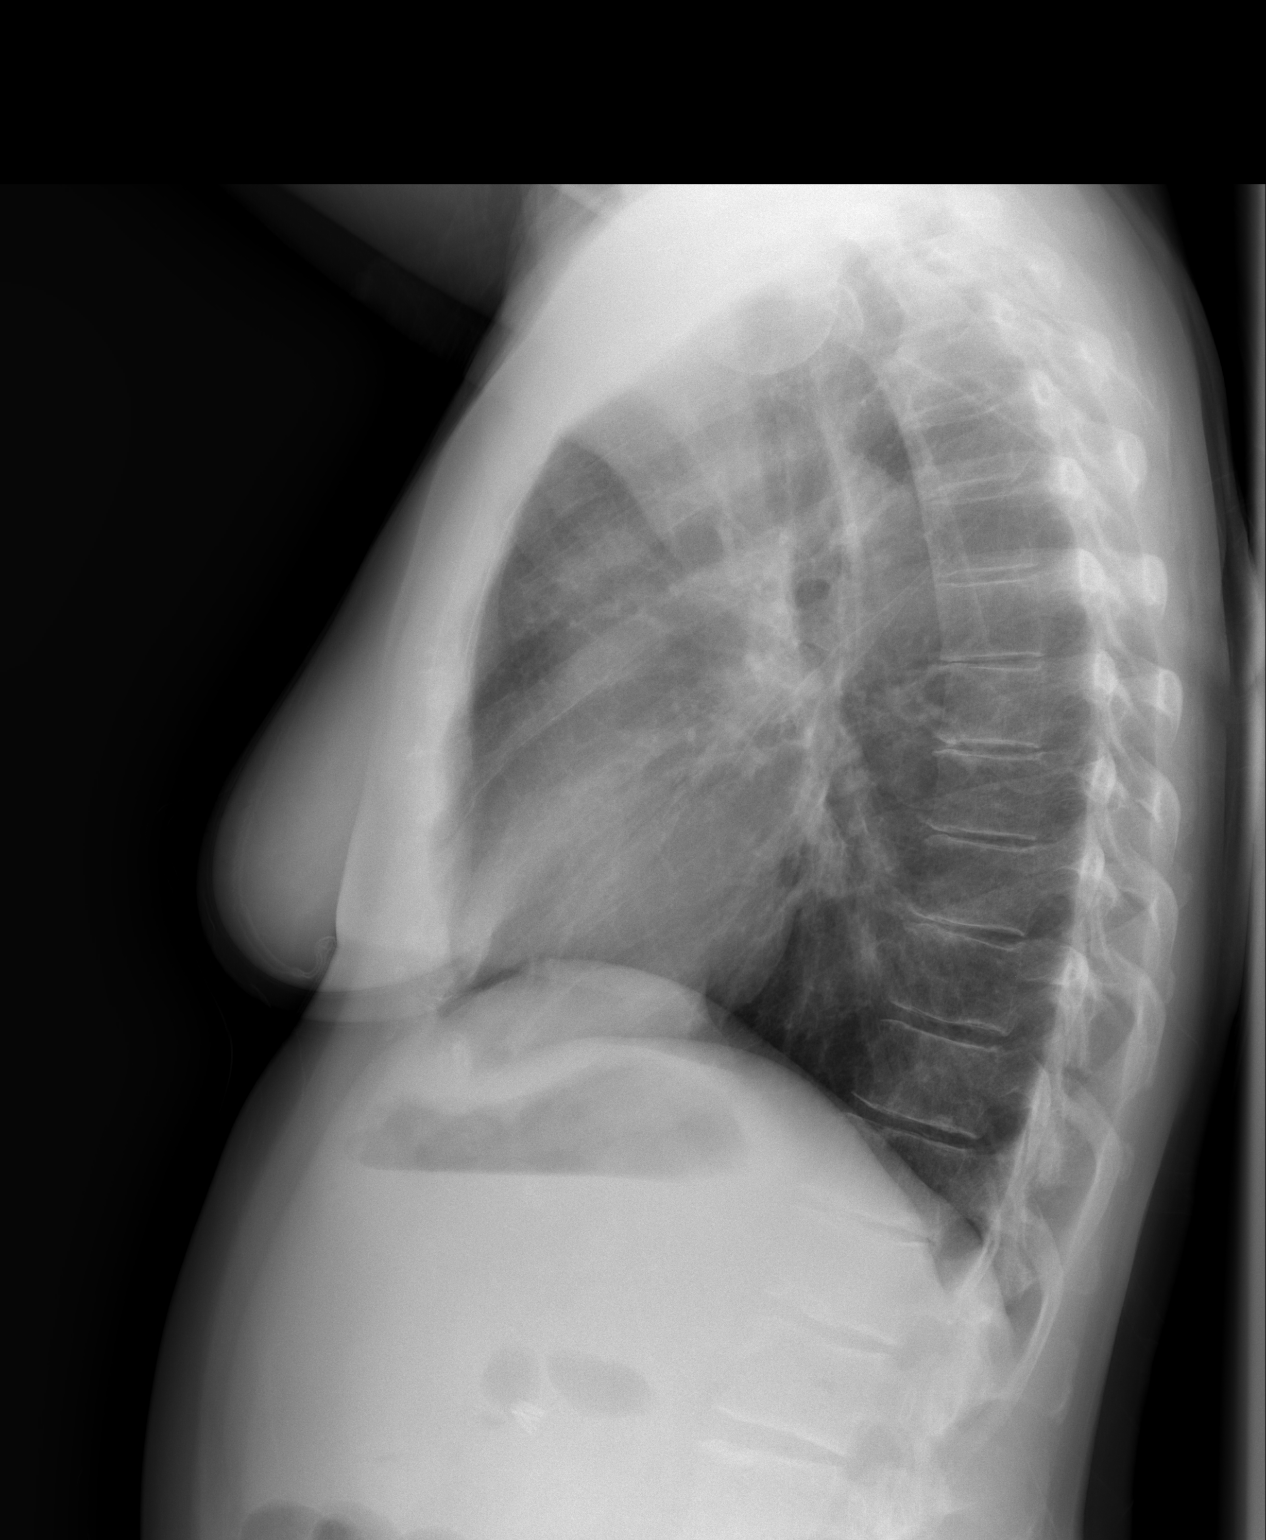

[2 of 2 positions shown; findings below may reference images not displayed]

FINDINGS: Lungs are clear.  No pleural effusion or pneumothorax.

The heart is normal in size.

Visualized osseous structures are within normal limits.

Cholecystectomy clips.
IMPRESSION: Normal chest radiographs.

## 2022-08-22 DIAGNOSIS — M654 Radial styloid tenosynovitis [de Quervain]: Secondary | ICD-10-CM | POA: Diagnosis not present

## 2022-08-22 DIAGNOSIS — M1812 Unilateral primary osteoarthritis of first carpometacarpal joint, left hand: Secondary | ICD-10-CM | POA: Diagnosis not present

## 2022-09-07 DIAGNOSIS — R7303 Prediabetes: Secondary | ICD-10-CM | POA: Diagnosis not present

## 2022-09-07 DIAGNOSIS — Z0000 Encounter for general adult medical examination without abnormal findings: Secondary | ICD-10-CM | POA: Diagnosis not present

## 2022-09-07 DIAGNOSIS — I10 Essential (primary) hypertension: Secondary | ICD-10-CM | POA: Diagnosis not present

## 2022-09-07 DIAGNOSIS — E7800 Pure hypercholesterolemia, unspecified: Secondary | ICD-10-CM | POA: Diagnosis not present

## 2022-09-07 DIAGNOSIS — Z1331 Encounter for screening for depression: Secondary | ICD-10-CM | POA: Diagnosis not present

## 2022-09-08 ENCOUNTER — Ambulatory Visit: Payer: Medicare Other | Admitting: Cardiovascular Disease

## 2022-10-03 ENCOUNTER — Ambulatory Visit: Payer: Medicare Other | Admitting: Podiatry

## 2022-10-03 ENCOUNTER — Encounter: Payer: Self-pay | Admitting: Podiatry

## 2022-10-03 DIAGNOSIS — E669 Obesity, unspecified: Secondary | ICD-10-CM | POA: Insufficient documentation

## 2022-10-03 DIAGNOSIS — M19072 Primary osteoarthritis, left ankle and foot: Secondary | ICD-10-CM

## 2022-10-03 NOTE — Patient Instructions (Signed)
Call [facility] to schedule your MRI at the below locations.  Please allow at least 1 business day after your visit to process the referral.  It may take longer depending on approval from insurance.  Please let me know if you have issues or problems scheduling the MRI   [facility] [phone] [address] [place], [place] [address]  [facility] [phone] [address] [place], [place] [address]  

## 2022-10-05 NOTE — Progress Notes (Signed)
  Subjective:  Patient ID: Colleen Shannon, female    DOB: December 05, 1955,  MRN: [id number]  Chief Complaint  Patient presents with   Arthritis    right midfoot pain/ needs injection    66 y.o. female presents with the above complaint. History confirmed with patient.  She is referred to me and I am seeing her in consultation today with Dr. [name] for her left foot problem.  She has midfoot pain in the arch which is collapse as well as a bunion that is painful.  She also has a toe that is lifting off and sticks up above the other toe.  Interval history: She has been doing okay about a month ago the pain in the right foot returned and was quite severe.  Intermittent pain in the left foot but is not as severe.  She is planning for revision knee surgery soon  Objective:  Physical Exam: warm, good capillary refill, no trophic changes or ulcerative lesions, normal DP and PT pulses, and normal sensory exam. Left Foot: No pain to palpation today Right foot pain and palpation of the midfoot  Radiographs: Multiple views x-ray of the left foot:  Show osteoarthritis and fault at the navicular cuneiform joint with hallux valgus deformity as well as a long second metatarsal  IMPRESSION: 1. A 9 x 8 mm osteochondral lesion involving the medial corner of the talar dome with partial-thickness overlying cartilage loss and mild subchondral reactive marrow edema. 2. Mild osteoarthritis of the first MTP joint. 3. Moderate-severe osteoarthritis of the navicular-medial cuneiform joint with subchondral reactive marrow edema.     Electronically Signed   By: Kathreen Devoid M.D.   On: 09/17/2021 14:40    Assessment:   1. Arthritis of midfoot       Plan:  Patient was evaluated and treated and all questions answered.  We again discussed surgical correction.  She is planning to undergo revision knee surgery soon.  She will discuss with her orthopedist whether she should undergo foot surgery prior to any  surgery or vice versa.  We discussed the limitations of nonweightbearing after surgery.  CT is ordered for surgical planning for the right foot.  I will see her back after this when she is ready to plan      Today proceeded with repeat corticosteroid injection following sterile prep with Betadine.  10 mg of Kenalog was injected directly into the naviculocuneiform joint from a dorsal medial approach.  Was able to approach this between the tibialis anterior and tibialis posterior tendons without entering either sheath.  She tolerated this well.  Return if symptoms worsen or fail to improve.

## 2022-10-17 ENCOUNTER — Encounter: Payer: Self-pay | Admitting: Hematology

## 2022-10-18 DIAGNOSIS — M654 Radial styloid tenosynovitis [de Quervain]: Secondary | ICD-10-CM | POA: Diagnosis not present

## 2022-11-02 DIAGNOSIS — M654 Radial styloid tenosynovitis [de Quervain]: Secondary | ICD-10-CM | POA: Diagnosis not present

## 2022-11-06 ENCOUNTER — Ambulatory Visit
Admission: RE | Admit: 2022-11-06 | Discharge: 2022-11-06 | Disposition: A | Discharge status: Home / Self Care | Payer: Medicare Other | Source: Ambulatory Visit | Attending: Podiatry | Admitting: Podiatry

## 2022-11-06 ENCOUNTER — Encounter: Payer: Self-pay | Admitting: Podiatry

## 2022-11-06 DIAGNOSIS — M899 Disorder of bone, unspecified: Secondary | ICD-10-CM | POA: Diagnosis not present

## 2022-11-06 DIAGNOSIS — M19072 Primary osteoarthritis, left ankle and foot: Secondary | ICD-10-CM

## 2022-11-08 ENCOUNTER — Encounter: Payer: Self-pay | Admitting: Podiatry

## 2022-11-08 ENCOUNTER — Ambulatory Visit: Payer: Medicare Other | Attending: Cardiovascular Disease | Admitting: Cardiovascular Disease

## 2022-11-08 ENCOUNTER — Encounter: Payer: Self-pay | Admitting: Cardiovascular Disease

## 2022-11-08 VITALS — BP 122/72 | HR 73 | Ht 61.0 in | Wt 152.6 lb

## 2022-11-08 DIAGNOSIS — E785 Hyperlipidemia, unspecified: Secondary | ICD-10-CM | POA: Diagnosis not present

## 2022-11-08 DIAGNOSIS — I10 Essential (primary) hypertension: Secondary | ICD-10-CM

## 2022-11-08 DIAGNOSIS — C9110 Chronic lymphocytic leukemia of B-cell type not having achieved remission: Secondary | ICD-10-CM

## 2022-11-08 DIAGNOSIS — I700 Atherosclerosis of aorta: Secondary | ICD-10-CM | POA: Diagnosis not present

## 2022-11-08 DIAGNOSIS — Z8249 Family history of ischemic heart disease and other diseases of the circulatory system: Secondary | ICD-10-CM

## 2022-11-08 DIAGNOSIS — K219 Gastro-esophageal reflux disease without esophagitis: Secondary | ICD-10-CM

## 2022-11-08 NOTE — Progress Notes (Signed)
Patient ID: Colleen Shannon, female   DOB: Jan 19, 1956, 67 y.o.   MRN: [id number]       HPI: Colleen Shannon is a 67 y.o. female presents to the office today for an 56 month cardiology evaluation.  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  Colleen Shannon has a history of hypertension and previous chest pain for which he was started on carvedilol and initially nifedipine by Dr. Elisabeth Cara with a presumptive dose of possible spasm. In August 2013 a nuclear perfusion study was entirely normal. 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 She has been on lipid-lowering therapy with atorvastatin. When I saw her 2 years ago I discontinued her nifedipine and switched her to amlodipine. She states her blood pressure has been controlled on this regimen. An echo Doppler study in August 2013 showed normal systolic function. She did have mild mitral regurgitation.  Over the past year, she has done well from a cardiac standpoint.  She denies any episodes of chest pain.  He denies palpitations.  She is now followed by Dr. Carol Ada for primary care.  Recent laboratory had been drawn for which she was told was excellent.  Colleen Shannon underwent right knee replacement surgery by Dr. Sandie Ano on 05/11/2014.  She tolerated that well from a cardiovascular standpoint.  She underwent knee replacement to her left knee in November 2017.  She also was diagnosed as having CLL.  She is followed by Dr. Lindi Adie. .  She continues to take tamoxifen for previous carcinoma in situ on breast biopsy.   I last saw her in January 2019 at which time she had noticed palpitations which may occur 4-5 days per week.  These typically are isolated and last for approximately 20 seconds.  She denies any sustained episodes of tachycardia.  She denies chest pain.  I recommended slight titration of her current date of carvedilol  up to 9.375 mg twice a day.  Her blood pressure was stable and she was advised to continue her current dose of amlodipine.    Follow-up echo Doppler study in February 2019 which showed an EF of 55 to 60% with grade 1 diastolic dysfunction.  There was mild mitral regurgitation.  Pulmonary pressures were normal.    She was seen in April 2019 by Almyra Deforest, PA and had noted that her baseline heart rate had been increasing to the 80s.  She was drinking 1 cup of coffee per day.  Carvedilol was further increased to 12.5 mg twice a day.  When I last saw her in July 2019 she denied any episodes of chest pain but was concerned since her younger brother had suffered an MI and had required insertion of for coronary stents.  During that evaluation she was inquiring about screening for CAD.   She underwent a CT cardiac scoring study which showed a coronary calcium score of 0.  Her chest CT did show diffuse bronchial wall thickening.  She states as result of her CLL she does have episodes of bronchitis frequently.  I last saw her in November 2019 unfortunately her brother died at age 47 and was ultimately found to have a pituitary adenoma with development of Cushing's disease and 12 days following his resection of his adenoma he apparently had a bleed requiring mechanical ventilation and ultimately developed multisystem organ failure leading to his death.  This has  been stressful for Colleen Shannon.  She denied any chest pain.   She has had difficulty with her right knee.  She had previously undergone bilateral knee replacements.  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  She was followed by Dr. Leland Johns for her left breast cancer originally diagnosed in 2015 and currently on tamoxifen as well as her CLL with most recent white blood count 39.9 thousand which has been fairly  stable.    I saw her in December 2020.  Since my last evaluation, she is now being followed by Dr. Irene Limbo for her CLL.  She is felt to have RAI stage I chronic lymphocytic leukemia.  She is now undergoing treatment with Gazyva.  She was last evaluated by me on November 01, 2020.  At that time she had lost  weight down to 120 pounds.with her weight loss her blood pressure became low and she ultimately stopped taking amlodipine.  She continued to be on carvedilol at a reduced dose of 6.25 mg twice a day.  Recently she had noticed some weight gain rest her blood pressure slowly increasing.  She denied any chest tightness or shortness of breath, presyncope, palpitations or syncope.    Since I last saw her, she has continued to remain stable.  She is status post prior breast CVA for which she was seen by Dr. Payton Mccallum for lobular carcinoma in situ of her left breast, and had lumpectomy and completed 5 years of tamoxifen.  More recently she was diagnosed with CLL and is now seen by Dr.Kale.  She last saw him on August 25, 2021.  She was felt to have Rai stage I and in the past had fever and abnormal LFTs.  Recent CT scan of her chest abdomen pelvis was completed which showed no evidence for persistent lymphadenopathy in the chest abdomen or pelvis.  She had a normal spleen.  She was status postcholecystectomy.  Of note there was suggestion of aortic atherosclerosis.  She continues to be pain-free and asymptomatic from a cardiac standpoint.  She is followed by Dr. Carol Ada for her primary care.  She presents for evaluation.   Past Medical History:  Diagnosis Date   Arthritis    Breast CA ([place]) dx'd 2015   left   Bronchiectasis ([place])    Cancer (HCC)    hx of skin cancer    CLL (chronic lymphocytic leukemia) ([place]) dx'd 2015   Dyspnea    Dysrhythmia    PVCs   Elevated cholesterol 05/22/2012   Nuc stress test-Normal.   Endometrial polyp    GERD (gastroesophageal reflux disease)    Grade I diastolic  dysfunction    noted on echo   History of palpitations    Hypertension 05/22/12   ECHO-EF >55% trace tricupsid regurgitation. There is mild mitral regurgitation.    MR (mitral regurgitation)    mild noted on echo   Plantar fasciitis of left foot    resolved   Pneumonia    history of x 2   PONV (postoperative nausea and vomiting)     Past Surgical History:  Procedure Laterality Date   ABDOMINAL SURGERY     Abdominoplasty   BRONCHIAL BIOPSY  06/29/2020   Procedure: BRONCHIAL BIOPSIES;  Surgeon: Collene Gobble, MD;  Location: [facility];  Service: Cardiopulmonary;;   BRONCHIAL BRUSHINGS  06/29/2020   Procedure:  BRONCHIAL BRUSHINGS;  Surgeon: Collene Gobble, MD;  Location: [facility];  Service: Cardiopulmonary;;   BRONCHIAL WASHINGS  06/29/2020   Procedure: BRONCHIAL WASHINGS;  Surgeon: Collene Gobble, MD;  Location: [facility];  Service: Cardiopulmonary;;   CESAREAN SECTION     X 2   CHOLECYSTECTOMY     COLONSCOPY     DILATION AND CURETTAGE OF UTERUS     FOOT SURGERY Left    HYSTEROSCOPY     KNEE ARTHROTOMY Right 06/04/2019   Procedure: Right knee arthrotomy; scar excision;  Surgeon: [name], MD;  Location: [facility];  Service: Orthopedics;  Laterality: Right;  50mn   KNEE CLOSED REDUCTION Right 07/20/2014   Procedure: RIGHT KNEE CLOSED MANIPULATION;  Surgeon: FGearlean Alf MD;  Location: [facility];  Service: Orthopedics;  Laterality: Right;   KNEE SURGERY Right    arthroscopic   PARTIAL KNEE ARTHROPLASTY Left 2017   RADIOACTIVE SEED GUIDED EXCISIONAL BREAST BIOPSY N/A 10/06/2014   Procedure: RADIOACTIVE SEED GUIDED EXCISIONAL BREAST BIOPSY;  Surgeon: MRolm Bookbinder MD;  Location: [facility]  Service: General;  Laterality: N/A;   REFRACTIVE SURGERY     TOTAL KNEE ARTHROPLASTY Right 05/11/2014   Procedure: RIGHT TOTAL KNEE ARTHROPLASTY;  Surgeon: FGearlean Alf MD;  Location: [facility];  Service: Orthopedics;  Laterality: Right;   TUBAL LIGATION      VIDEO BRONCHOSCOPY N/A 06/29/2020   Procedure: VIDEO BRONCHOSCOPY WITHOUT FLUORO;  Surgeon: BCollene Gobble MD;  [facility];  Service: Cardiopulmonary;  Laterality: N/A;  with BAL    No Known Allergies  Current Outpatient Medications  Medication Sig Dispense Refill   albuterol (VENTOLIN HFA) 108 (90 Base) MCG/ACT inhaler Inhale 1-2 puffs into the lungs every 6 (six) hours as needed for wheezing or shortness of breath.     amLODipine (NORVASC) 5 MG tablet Take 1 tablet (5 mg total) by mouth at bedtime. 90 tablet 1   atorvastatin (LIPITOR) 20 MG tablet Take 1 tablet (20 mg total) by mouth daily. 90 tablet 1   B Complex Vitamins (B COMPLEX 1 PO)      Calcium Carb-Cholecalciferol (CALCIUM-VITAMIN D3) 600-400 MG-UNIT TABS Take 1 tablet by mouth 2 (two) times daily.     carvedilol (COREG) 12.5 MG tablet Take 0.5 tablets (6.25 mg total) by mouth 2 (two) times daily with a meal. 90 tablet 1   cholecalciferol (VITAMIN D3) 25 MCG (1000 UNIT) tablet Take 1,000 Units by mouth in the morning and at bedtime.     Cyanocobalamin (B-12) 2500 MCG TABS Take 2,500 mcg by mouth daily.     ezetimibe (ZETIA) 10 MG tablet Take 1 tablet (10 mg total) by mouth at bedtime. 90 tablet 1   famotidine (PEPCID) 40 MG tablet 1 tab(s) orally 1-2 times a day for 30 days     meloxicam (MOBIC) 7.5 MG tablet Take 7.5 mg by mouth as needed.     Multiple Vitamin (MULTIVITAMIN WITH MINERALS) TABS tablet Take 1 tablet by mouth daily.     No current facility-administered medications for this visit.    Social History   Socioeconomic History   Marital status: Married    Spouse name: Not on file   Number of children: Not on file   Years of education: Not on file   Highest education level: Not on file  Occupational History   Not on file  Tobacco Use   Smoking status: Never   Smokeless tobacco: Never  Vaping Use   Vaping  Use: Never used  Substance and Sexual Activity   Alcohol use: Yes    Comment: occasional     Drug use: No   Sexual activity: Yes    Birth control/protection: Surgical, Post-menopausal    Comment: Hysterectomy  Other Topics Concern   Not on file  Social History Narrative   Not on file   Social Determinants of Health   Financial Resource Strain: Not on file  Food Insecurity: Not on file  Transportation Needs: Not on file  Physical Activity: Not on file  Stress: Not on file  Social Connections: Not on file  Intimate Partner Violence: Not on file   Additional social history is notable that she is married for 35 years. 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 There is no tobacco use. He does drink occasional wine. .  She is status post bilateral knee replacements.   Family History  Problem Relation Age of Onset   Heart disease Father    Dementia Father    Aneurysm Mother    Hyperlipidemia Mother    Alzheimer's disease Mother    Osteoarthritis Mother    Hypertension Brother    High Cholesterol Brother    Cancer - Other Paternal Grandmother        uterine   Hypertension Paternal Grandfather    Stroke Paternal Grandfather    Heart disease Maternal Grandfather    Cancer - Other Maternal Grandfather        lung   Cushing syndrome Brother    Healthy Son    Healthy Daughter    ROS General: Negative; No fevers, chills, or night sweats;  HEENT: Negative; No changes in vision or hearing, sinus congestion, difficulty swallowing Pulmonary: Negative; No cough, wheezing, shortness of breath, hemoptysis Cardiovascular: Negative; No chest pain, presyncope, syncope, palpitations GI: Negative; No nausea, vomiting, diarrhea, or abdominal pain GU: Negative; No dysuria, hematuria, or difficulty voiding Musculoskeletal: Positive for bilateral knee replacement surgery;positive for osteoarthritis no myalgias,  Hematologic/Oncology: positive for CLL, history of breast cancer Endocrine: Negative; no heat/cold intolerance; no  diabetes Neuro: Negative; no changes in balance, headaches Skin: Negative; No rashes or skin lesions Psychiatric: Negative; No behavioral problems, depression Sleep: Negative; No snoring, daytime sleepiness, hypersomnolence, bruxism, restless legs, hypnogognic hallucinations, no cataplexy Other comprehensive 14 point system review is negative.  PE BP 122/72   Pulse 73   Ht 5' 1"  (1.549 m)   Wt 152 lb 9.6 oz (69.2 kg)   LMP 11/19/2011 (LMP Unknown)   SpO2 97%   BMI 28.83 kg/m    Repeat blood pressure was 110/64.  Wt Readings from Last 3 Encounters:  11/08/22 152 lb 9.6 oz (69.2 kg)  06/23/22 151 lb 12.8 oz (68.9 kg)  12/21/21 149 lb 3.2 oz (67.7 kg)    Physical Exam BP 122/72   Pulse 73   Ht 5' 1"  (1.549 m)   Wt 152 lb 9.6 oz (69.2 kg)   LMP 11/19/2011 (LMP Unknown)   SpO2 97%   BMI 28.83 kg/m  General: Alert, oriented, no distress.  Skin: normal turgor, no rashes, warm and dry HEENT: Normocephalic, atraumatic. Pupils equal round and reactive to light; sclera anicteric; extraocular muscles intact; Fundi ** Nose without nasal septal hypertrophy Mouth/Parynx benign; Mallinpatti scale Neck: No JVD, no carotid bruits; normal carotid upstroke Lungs: clear to ausculatation and percussion; no wheezing or rales Chest wall: without tenderness to palpitation Heart: PMI not displaced, RRR, s1 s2  normal, 1/6 systolic murmur, no diastolic murmur, no rubs, gallops, thrills, or heaves Abdomen: soft, nontender; no hepatosplenomehaly, BS+; abdominal aorta nontender and not dilated by palpation. Back: no CVA tenderness Pulses 2+ Musculoskeletal: full range of motion, normal strength, no joint deformities Extremities: no clubbing cyanosis or edema, Homan's sign negative  Neurologic: grossly nonfocal; Cranial nerves grossly wnl Psychologic: Normal mood and affect    General: Alert, oriented, no distress.  Skin: normal turgor, no rashes, warm and dry HEENT: Normocephalic, atraumatic.  Pupils equal round and reactive to light; sclera anicteric; extraocular muscles intact;  Nose without nasal septal hypertrophy Mouth/Parynx benign; Mallinpatti scale 2 Neck: No JVD, no carotid bruits; normal carotid upstroke Lungs: clear to ausculatation and percussion; no wheezing or rales Chest wall: without tenderness to palpitation Heart: PMI not displaced, RRR, s1 s2 normal, 1/6 systolic murmur, no diastolic murmur, no rubs, gallops, thrills, or heaves Abdomen: soft, nontender; no hepatosplenomehaly, BS+; abdominal aorta nontender and not dilated by palpation. Back: no CVA tenderness Pulses 2+ Musculoskeletal: full range of motion, normal strength, no joint deformities Extremities: no clubbing cyanosis or edema, Homan's sign negative  Neurologic: grossly nonfocal; Cranial nerves grossly wnl Psychologic: Normal mood and affect  November 08, 2022 ECG (independently read by me):  NSR at 73, no ectopy  September 21, 2021 ECG (independently read by me): NSR at 69, no ectopy, normal intervals  November 01, 2020 ECG (independently read by me): NSR at 67  December 2020 ECG (independently read by me): Normal sinus rhythm at 77 bpm.  No ST segment changes.  No ectopy.  Normal intervals.  November 2019 ECG (independently read by me): Normal sinus rhythm at 69 bpm.  No ectopy.  Normal intervals.  July 2019 ECG (independently read by me): Normal sinus rhythm at 64 bpm.  No ectopy.  Normal intervals.  No ST segment changes.  January 2019 ECG (independently read by me): normal sinus rhythm at 76 bpm.  Normal intervals.  No ectopy or ST changes  November 2016 ECG (independently read by me) from 07/17/2014: Normal sinus rhythm.  No ectopy.  Normal intervals.  No ST segment changes.  August 2014 ECG: Normal sinus rhythm at 75 beats per minute without ectopy. Normal intervals.  LABS:    Latest Ref Rng & Units 06/23/2022   10:16 AM 12/21/2021    8:48 AM 08/25/2021    1:49 PM  BMP  Glucose 70 - 99  mg/dL 104  97  103   BUN 8 - 23 mg/dL 19  20  20    Creatinine 0.44 - 1.00 mg/dL 0.54  0.72  0.80   Sodium 135 - 145 mmol/L 140  140  139   Potassium 3.5 - 5.1 mmol/L 3.8  3.8  4.1   Chloride 98 - 111 mmol/L 105  102  100   CO2 22 - 32 mmol/L 28  31  28    Calcium 8.9 - 10.3 mg/dL 9.7  9.5  9.8       Latest Ref Rng & Units 06/23/2022   10:16 AM 12/21/2021    8:48 AM 08/25/2021    1:49 PM  Hepatic Function  Total Protein 6.5 - 8.1 g/dL 6.8  6.8  6.8   Albumin 3.5 - 5.0 g/dL 4.3  4.5  4.5   AST 15 - 41 U/L 20  18  24    ALT 0 - 44 U/L 25  19  24    Alk Phosphatase 38 - 126 U/L 48  47  53  Total Bilirubin 0.3 - 1.2 mg/dL 0.6  0.5  0.8       Latest Ref Rng & Units 06/23/2022   10:16 AM 12/21/2021    8:48 AM 08/25/2021    1:49 PM  CBC  WBC 4.0 - 10.5 K/uL 6.3  3.6  4.3   Hemoglobin 12.0 - 15.0 g/dL 14.0  13.5  13.2   Hematocrit 36.0 - 46.0 % 40.6  39.8  37.6   Platelets 150 - 400 K/uL 210  220  180    Lab Results  Component Value Date   MCV 89.6 06/23/2022   MCV 90.5 12/21/2021   MCV 90.6 08/25/2021   Lab Results  Component Value Date   TSH 1.860 01/30/2018   Lipid Panel     Component Value Date/Time   CHOL 131 07/15/2018 0830   TRIG 84 07/15/2018 0830   HDL 56 07/15/2018 0830   CHOLHDL 2.3 07/15/2018 0830   CHOLHDL 2.3 08/19/2015 0857   VLDL 17 08/19/2015 0857   LDLCALC 58 07/15/2018 0830    RADIOLOGY: No results found.  IMPRESSION:  No diagnosis found.   ASSESSMENT AND PLAN: Ms. 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  In August 2013 a nuclear perfusion study was normal.   When I saw her in January 2019, her blood pressure was mildly elevated based on recent change in guidelines and she was experiencing some episodic palpitations.  Her palpitations improved with slight titration of carvedilol and when seen several months later her dose was further  titrated to 12.5 mg twice a day.  When I last saw her, her blood pressure was stable on amlodipine 5 mg and carvedilol 12.5 mg twice a day.  She had lost significant weight and discontinued amlodipine and her carvedilol dose has been reduced but at her last evaluation pressure had further increased.  Presently, she is now on a regimen consisting amlodipine 5 mg in addition to carvedilol 6.25 mg twice a day.  She continues to be on atorvastatin 20 mg and Zetia 10 mg for hyperlipidemia.  LDL cholesterol on August 02, 2021 was 85.  In 2019, after her younger brother had suffered a myocardial infarction and required stenting she underwent CT cardiac scoring which showed a calcium score of 0.  Presently she remains asymptomatic with reference to chest pain or shortness of breath.  She will be having repeat laboratory with her primary physician, Dr. Tamala Julian.  Presently I will continue her on her current regimen of atorvastatin and Zetia but if lipid studies continue to increase further titration back to 40 mg of atorvastatin can be considered.  She has now completed 1 year of therapy for her CLL and will be following up with Dr.Kale regarding her CT imaging.  She will continue to monitor her blood pressure and heart rate at home.  As long as she is stable I will see her in 1 year for reevaluation or sooner as needed.  Troy Sine, MD, [facility]  11/08/2022 12:30 PM

## 2022-11-08 NOTE — Patient Instructions (Signed)
Medication Instructions:  Your physician recommends that you continue on your current medications as directed. Please refer to the Current Medication list given to you today.  *If you need a refill on your cardiac medications before your next appointment, please call your pharmacy*   Lab Work: Please return for FASTING labs in May (CMET, Lipid, LP(a))  Our in office lab hours are Monday-Friday 8:00-4:00, closed for lunch 12:45-1:45 pm.  No appointment needed.  [facility] locations:   [place] ([place]) - [address] [place] [address] - [address] [place] - [address] ([facility])   Follow-Up: At [facility], you and your health needs are our priority.  As part of our continuing mission to provide you with exceptional heart care, we have created designated Provider Care Teams.  These Care Teams include your primary Cardiologist (physician) and Advanced Practice Providers (APPs -  Physician Assistants and Nurse Practitioners) who all work together to provide you with the care you need, when you need it.  We recommend signing up for the patient portal called "MyChart".  Sign up information is provided on this After Visit Summary.  MyChart is used to connect with patients for Virtual Visits (Telemedicine).  Patients are able to view lab/test results, encounter notes, upcoming appointments, etc.  Non-urgent messages can be sent to your provider as well.   To learn more about what you can do with MyChart, go to [email or web address].    Your next appointment:   12 month(s)  Provider:   Shelva Majestic, MD

## 2022-11-09 ENCOUNTER — Telehealth: Payer: Self-pay | Admitting: Podiatry

## 2022-11-09 ENCOUNTER — Encounter: Payer: Self-pay | Admitting: Cardiovascular Disease

## 2022-11-09 NOTE — Telephone Encounter (Signed)
Pt called and did get the email from [employer] with the letter attached but could not print it. She asked if I could print it and she could pick up.   I printed it and it is down stairs for pick up.

## 2022-11-10 DIAGNOSIS — Z1231 Encounter for screening mammogram for malignant neoplasm of breast: Secondary | ICD-10-CM | POA: Diagnosis not present

## 2022-11-21 NOTE — H&P (Signed)
TOTAL KNEE REVISION ADMISSION H&P  Patient is being admitted for right total knee arthroplasty.  Subjective:  Chief Complaint: Right knee pain.  HPI: Colleen Shannon, 67 y.o. female has a history of pain and functional disability in the right knee due to failed right TKA and has failed non-surgical conservative treatments for greater than 12 weeks to include flexibility and strengthening excercises and activity modification. Onset of symptoms was gradual, starting 4 years ago with gradually worsening course since that time. The patient noted  prior surgeries including closed manipulation and scar excision  on the right knee.  Patient currently rates pain in the right knee at 6 out of 10 with activity. Patient has pain that interferes with activities of daily living. Patient has evidence of  prosthetic loosening  by bone scan. There is no active infection.  Patient Active Problem List   Diagnosis Date Noted   Obesity 10/03/2022   Atypical lobular hyperplasia of left breast 08/25/2021   Chronic pain 08/25/2021   Gastroesophageal reflux disease 08/25/2021   Lymphocytosis 08/25/2021   Mitral valve disorder 08/25/2021   Personal history of malignant neoplasm of breast 08/25/2021   Prediabetes 08/25/2021   Bunion 03/14/2021   Metatarsalgia of left foot 03/14/2021   Pain in left foot 09/22/2020   Counseling regarding advance care planning and goals of care 07/09/2020   Pain in joint of right ankle 02/09/2020   Arthrofibrosis of total knee arthroplasty ([place]) 06/04/2019   History of total knee replacement, right 12/17/2017   Chronic lymphocytic leukemia (CLL), B-cell ([place]) 02/08/2016   Neoplasm of left breast, primary tumor staging category Tis: lobular carcinoma in situ (LCIS) 11/18/2014   Essential hypertension 07/29/2014   Hyperlipidemia 07/29/2014   Postoperative stiffness of total knee replacement (HCC) 07/20/2014   OA (osteoarthritis) of knee 05/11/2014   Varicose veins of lower  extremities with other complications [id number]   Hypertension    Elevated cholesterol     Past Medical History:  Diagnosis Date   Arthritis    Breast CA ([place]) dx'd 2015   left   Bronchiectasis ([place])    Cancer ([place])    hx of skin cancer    CLL (chronic lymphocytic leukemia) ([place]) dx'd 2015   Dyspnea    Dysrhythmia    PVCs   Elevated cholesterol 05/22/2012   Nuc stress test-Normal.   Endometrial polyp    GERD (gastroesophageal reflux disease)    Grade I diastolic dysfunction    noted on echo   History of palpitations    Hypertension 05/22/12   ECHO-EF >55% trace tricupsid regurgitation. There is mild mitral regurgitation.    MR (mitral regurgitation)    mild noted on echo   Plantar fasciitis of left foot    resolved   Pneumonia    history of x 2   PONV (postoperative nausea and vomiting)     Past Surgical History:  Procedure Laterality Date   ABDOMINAL SURGERY     Abdominoplasty   BRONCHIAL BIOPSY  06/29/2020   Procedure: BRONCHIAL BIOPSIES;  Surgeon: Collene Gobble, MD;  Location: [place];  Service: Cardiopulmonary;;   BRONCHIAL BRUSHINGS  06/29/2020   Procedure: BRONCHIAL BRUSHINGS;  Surgeon: Collene Gobble, MD;  Location: [facility];  Service: Cardiopulmonary;;   BRONCHIAL WASHINGS  06/29/2020   Procedure: BRONCHIAL WASHINGS;  Surgeon: Collene Gobble, MD;  [facility];  Service: Cardiopulmonary;;   CESAREAN SECTION     X 2   CHOLECYSTECTOMY     COLONSCOPY  DILATION AND CURETTAGE OF UTERUS     FOOT SURGERY Left    HYSTEROSCOPY     KNEE ARTHROTOMY Right 06/04/2019   Procedure: Right knee arthrotomy; scar excision;  Surgeon: [name], MD;  Location: [facility];  Service: Orthopedics;  Laterality: Right;  75mn   KNEE CLOSED REDUCTION Right 07/20/2014   Procedure: RIGHT KNEE CLOSED MANIPULATION;  Surgeon: FGearlean Alf MD;  Location: [facility];  Service: Orthopedics;  Laterality: Right;   KNEE SURGERY Right    arthroscopic   PARTIAL KNEE  ARTHROPLASTY Left 2017   RADIOACTIVE SEED GUIDED EXCISIONAL BREAST BIOPSY N/A 10/06/2014   Procedure: RADIOACTIVE SEED GUIDED EXCISIONAL BREAST BIOPSY;  Surgeon: MRolm Bookbinder MD;  [place] [facility]  Service: General;  Laterality: N/A;   REFRACTIVE SURGERY     TOTAL KNEE ARTHROPLASTY Right 05/11/2014   Procedure: RIGHT TOTAL KNEE ARTHROPLASTY;  Surgeon: FGearlean Alf MD;  Location[facility];  Service: Orthopedics;  Laterality: Right;   TUBAL LIGATION     VIDEO BRONCHOSCOPY N/A 06/29/2020   Procedure: VIDEO BRONCHOSCOPY WITHOUT FLUORO;  Surgeon: BCollene Gobble MD;  [facility];  Service: Cardiopulmonary;  Laterality: N/A;  with BAL    Prior to Admission medications   Medication Sig Start Date End Date Taking? Authorizing Provider  albuterol (VENTOLIN HFA) 108 (90 Base) MCG/ACT inhaler Inhale 1-2 puffs into the lungs every 6 (six) hours as needed for wheezing or shortness of breath.    [provider]  amLODipine (NORVASC) 5 MG tablet Take 1 tablet (5 mg total) by mouth at bedtime. 06/23/22   KTroy Sine MD  atorvastatin (LIPITOR) 20 MG tablet Take 1 tablet (20 mg total) by mouth daily. 06/19/22   KTroy Sine MD  B Complex Vitamins (B COMPLEX 1 PO)  11/11/20   [provider]  Calcium Carb-Cholecalciferol (CALCIUM-VITAMIN D3) 600-400 MG-UNIT TABS Take 1 tablet by mouth 2 (two) times daily.    [provider]  carvedilol (COREG) 12.5 MG tablet Take 0.5 tablets (6.25 mg total) by mouth 2 (two) times daily with a meal. 06/23/22   KTroy Sine MD  cholecalciferol (VITAMIN D3) 25 MCG (1000 UNIT) tablet Take 1,000 Units by mouth in the morning and at bedtime.    [provider]  Cyanocobalamin (B-12) 2500 MCG TABS Take 2,500 mcg by mouth daily.    [provider]  ezetimibe (ZETIA) 10 MG tablet Take 1 tablet (10 mg total) by mouth at bedtime. 06/19/22   KTroy Sine MD  famotidine (PEPCID) 40 MG tablet 1 tab(s)  orally 1-2 times a day for 30 days 06/28/21   [provider]  meloxicam (MOBIC) 7.5 MG tablet Take 7.5 mg by mouth as needed.    [provider]  Multiple Vitamin (MULTIVITAMIN WITH MINERALS) TABS tablet Take 1 tablet by mouth daily.    [provider]    No Known Allergies  Social History   Socioeconomic History   Marital status: Married    Spouse name: Not on file   Number of children: Not on file   Years of education: Not on file   Highest education level: Not on file  Occupational History   Not on file  Tobacco Use   Smoking status: Never   Smokeless tobacco: Never  Vaping Use   Vaping Use: Never used  Substance and Sexual Activity   Alcohol use: Yes    Comment: occasional    Drug use: No  Sexual activity: Yes    Birth control/protection: Surgical, Post-menopausal    Comment: Hysterectomy  Other Topics Concern   Not on file  Social History Narrative   Not on file   Social Determinants of Health   Financial Resource Strain: Not on file  Food Insecurity: Not on file  Transportation Needs: Not on file  Physical Activity: Not on file  Stress: Not on file  Social Connections: Not on file  Intimate Partner Violence: Not on file    Tobacco Use: Low Risk  (11/09/2022)   Patient History    Smoking Tobacco Use: Never    Smokeless Tobacco Use: Never    Passive Exposure: Not on file   Social History   Substance and Sexual Activity  Alcohol Use Yes   Comment: occasional     Family History  Problem Relation Age of Onset   Heart disease Father    Dementia Father    Aneurysm Mother    Hyperlipidemia Mother    Alzheimer's disease Mother    Osteoarthritis Mother    Hypertension Brother    High Cholesterol Brother    Cancer - Other Paternal Grandmother        uterine   Hypertension Paternal Grandfather    Stroke Paternal Grandfather    Heart disease Maternal Grandfather    Cancer - Other Maternal Grandfather        lung   [place]  syndrome Brother    Healthy Son    Healthy Daughter     ROS  Objective:  Physical Exam: The patient is a pleasant, well-developed female alert and oriented in no apparent distress.    The patient has an antalgic gait pattern favoring the right side.    Right knee exam:  No warmth or effusion.  The range of motion: 3 to 90 degrees with a firm endpoint.  Positive medial joint line tenderness.  No lateral joint line tenderness.  The knee is stable.    The patient's sensation and motor function are intact in their lower extremities. Their distal pulses are 2+. The bilateral calves are soft and nontender.  RESULTS  Bone scan of the right knee demonstrates increased uptake around the femoral component of the right knee consistent with probable loosening.    Radiographs- AP and lateral of the right knee dated 06/28/2022 demonstrate no fractures or bony deformities identified.    Assessment/Plan:  End stage arthritis, right knee   The patient history, physical examination, clinical judgment of the provider and imaging studies are consistent with end stage degenerative joint disease of the right knee and total knee arthroplasty is deemed medically necessary. The treatment options including medical management, injection therapy arthroscopy and arthroplasty were discussed at length. The risks and benefits of total knee arthroplasty were presented and reviewed. The risks due to aseptic loosening, infection, stiffness, patella tracking problems, thromboembolic complications and other imponderables were discussed. The patient acknowledged the explanation, agreed to proceed with the plan and consent was signed. Patient is being admitted for inpatient treatment for surgery, pain control, PT, OT, prophylactic antibiotics, VTE prophylaxis, progressive ambulation and ADLs and discharge planning. The patient is planning to be discharged home with OPPT arranged.  Anticipated LOS equal to or greater  than 2 midnights due to - Age 59 and older with one or more of the following:  - Obesity  - Expected need for hospital services (PT, OT, Nursing) required for safe  discharge  - Anticipated need for postoperative skilled nursing care or inpatient  rehab  - Active co-morbidities: None OR   - Unanticipated findings during/Post Surgery: None  - Patient is a high risk of re-admission due to: None  Therapy Plans: EO Disposition: Home with husband Planned DVT Prophylaxis: Xarelto DME Needed: Ice Machine PCP: Carol Ada, MD - clearance received Cardiologist: Shelva Majestic, MD - clearance in OV note (10-29-22) TXA: IV Allergies: NKDA Anesthesia Concerns: Nausea/vomiting BMI: 28.5 Last HgbA1c: Not diabetic [facility]: [facility] [place]  Other: -Hx CLL -requests CPM machine at home  - Patient was instructed on what medications to stop prior to surgery. - Follow-up visit in 2 weeks with Dr. Wynelle Link - Begin physical therapy following surgery - Pre-operative lab work as pre-surgical testing - Prescriptions will be provided in hospital at time of discharge  Shearon Balo, PA-C Orthopedic Surgery [facility]

## 2022-11-23 DIAGNOSIS — Z961 Presence of intraocular lens: Secondary | ICD-10-CM | POA: Diagnosis not present

## 2022-11-23 DIAGNOSIS — H18413 Arcus senilis, bilateral: Secondary | ICD-10-CM | POA: Diagnosis not present

## 2022-11-23 DIAGNOSIS — C9110 Chronic lymphocytic leukemia of B-cell type not having achieved remission: Secondary | ICD-10-CM | POA: Diagnosis not present

## 2022-11-23 DIAGNOSIS — H43813 Vitreous degeneration, bilateral: Secondary | ICD-10-CM | POA: Diagnosis not present

## 2022-11-23 DIAGNOSIS — H02831 Dermatochalasis of right upper eyelid: Secondary | ICD-10-CM | POA: Diagnosis not present

## 2022-11-23 DIAGNOSIS — J0100 Acute maxillary sinusitis, unspecified: Secondary | ICD-10-CM | POA: Diagnosis not present

## 2022-11-30 NOTE — Patient Instructions (Addendum)
SURGICAL WAITING ROOM VISITATION  Patients having surgery or a procedure may have no more than 2 support people in the waiting area - these visitors may rotate.    Children under the age of 45 must have an adult with them who is not the patient.  Due to an increase in RSV and influenza rates and associated hospitalizations, children ages 54 and under may not visit patients in [place].  If the patient needs to stay at the hospital during part of their recovery, the visitor guidelines for inpatient rooms apply. Pre-op nurse will coordinate an appropriate time for 1 support person to accompany patient in pre-op.  This support person may not rotate.    Please refer to the [facility] website for the visitor guidelines for Inpatients (after your surgery is over and you are in a regular room).    Your procedure is scheduled on: 12/13/22   Report to [facility] Main Entrance    Report to admitting at 8:00 AM   Call this number if you have problems the morning of surgery [phone]   Do not eat food :After Midnight.   After Midnight you may have the following liquids until 7:30 AM DAY OF SURGERY  Water Non-Citrus Juices (without pulp, NO RED-Apple, White grape, White cranberry) Black Coffee (NO MILK/CREAM OR CREAMERS, sugar ok)  Clear Tea (NO MILK/CREAM OR CREAMERS, sugar ok) regular and decaf                             Plain Jell-O (NO RED)                                           Fruit ices (not with fruit pulp, NO RED)                                     Popsicles (NO RED)                                                               Sports drinks like Gatorade (NO RED)                   The day of surgery:  Drink ONE (1) Pre-Surgery Clear Ensure at 8:00 AM the morning of surgery. Drink in one sitting. Do not sip.  This drink was given to you during your hospital  pre-op appointment visit. Nothing else to drink after completing the  Pre-Surgery Clear  Ensure.          If you have questions, please contact your surgeon's office.   FOLLOW BOWEL PREP AND ANY ADDITIONAL PRE OP INSTRUCTIONS YOU RECEIVED FROM YOUR SURGEON'S OFFICE!!!     Oral Hygiene is also important to reduce your risk of infection.                                    Remember - BRUSH YOUR TEETH THE MORNING OF SURGERY WITH YOUR REGULAR TOOTHPASTE  DENTURES WILL BE REMOVED PRIOR TO SURGERY PLEASE DO NOT APPLY "Poly grip" OR ADHESIVES!!!   Take these medicines the morning of surgery with A SIP OF WATER: Inhalers, Atorvastatin, Carvedilol, Zetia, Famotidine                               You may not have any metal on your body including hair pins, jewelry, and body piercing             Do not wear make-up, lotions, powders, perfumes, or deodorant  Do not wear nail polish including gel and S&S, artificial/acrylic nails, or any other type of covering on natural nails including finger and toenails. If you have artificial nails, gel coating, etc. that needs to be removed by a nail salon please have this removed prior to surgery or surgery may need to be canceled/ delayed if the surgeon/ anesthesia feels like they are unable to be safely monitored.   Do not shave  48 hours prior to surgery.    Do not bring valuables to the hospital. [place].   Contacts, glasses, dentures or bridgework may not be worn into surgery.   Bring small overnight bag day of surgery.   DO NOT [place]. PHARMACY WILL DISPENSE MEDICATIONS LISTED ON YOUR MEDICATION LIST TO YOU DURING YOUR ADMISSION [place]!              Please read over the following fact sheets you were given: IF Colleen Shannon [name]   If you received a COVID test during your pre-op visit  it is requested that you wear a mask when out in public, stay away from anyone that may not be  feeling well and notify your surgeon if you develop symptoms. If you test positive for Covid or have been in contact with anyone that has tested positive in the last 10 days please notify you surgeon.    [place] - Preparing for Surgery Before surgery, you can play an important role.  Because skin is not sterile, your skin needs to be as free of germs as possible.  You can reduce the number of germs on your skin by washing with CHG (chlorahexidine gluconate) soap before surgery.  CHG is an antiseptic cleaner which kills germs and bonds with the skin to continue killing germs even after washing. Please DO NOT use if you have an allergy to CHG or antibacterial soaps.  If your skin becomes reddened/irritated stop using the CHG and inform your nurse when you arrive at Short Stay. Do not shave (including legs and underarms) for at least 48 hours prior to the first CHG shower.  You may shave your face/neck.  Please follow these instructions carefully:  1.  Shower with CHG Soap the night before surgery and the  morning of surgery.  2.  If you choose to wash your hair, wash your hair first as usual with your normal  shampoo.  3.  After you shampoo, rinse your hair and body thoroughly to remove the shampoo.                             4.  Use CHG as you would any other liquid soap.  You can apply chg directly to the skin and wash.  Gently with a scrungie or clean washcloth.  5.  Apply the CHG Soap to your body ONLY FROM THE NECK DOWN.   Do   not use on face/ open                           Wound or open sores. Avoid contact with eyes, ears mouth and   genitals (private parts).                       Wash face,  Genitals (private parts) with your normal soap.             6.  Wash thoroughly, paying special attention to the area where your    surgery  will be performed.  7.  Thoroughly rinse your body with warm water from the neck down.  8.  DO NOT shower/wash with your normal soap after using and rinsing off  the CHG Soap.                9.  Pat yourself dry with a clean towel.            10.  Wear clean pajamas.            11.  Place clean sheets on your bed the night of your first shower and do not  sleep with pets. Day of Surgery : Do not apply any lotions/deodorants the morning of surgery.  Please wear clean clothes to the hospital/surgery center.  FAILURE TO FOLLOW THESE INSTRUCTIONS MAY RESULT IN THE CANCELLATION OF YOUR SURGERY  PATIENT SIGNATURE_________________________________  NURSE SIGNATURE__________________________________  ________________________________________________________________________  Adam Phenix  An incentive spirometer is a tool that can help keep your lungs clear and active. This tool measures how well you are filling your lungs with each breath. Taking long deep breaths may help reverse or decrease the chance of developing breathing (pulmonary) problems (especially infection) following: A long period of time when you are unable to move or be active. BEFORE THE PROCEDURE  If the spirometer includes an indicator to show your best effort, your nurse or respiratory therapist will set it to a desired goal. If possible, sit up straight or lean slightly forward. Try not to slouch. Hold the incentive spirometer in an upright position. INSTRUCTIONS FOR USE  Sit on the edge of your bed if possible, or sit up as far as you can in bed or on a chair. Hold the incentive spirometer in an upright position. Breathe out normally. Place the mouthpiece in your mouth and seal your lips tightly around it. Breathe in slowly and as deeply as possible, raising the piston or the ball toward the top of the column. Hold your breath for 3-5 seconds or for as long as possible. Allow the piston or ball to fall to the bottom of the column. Remove the mouthpiece from your mouth and breathe out normally. Rest for a few seconds and repeat Steps 1 through 7 at least 10 times every 1-2  hours when you are awake. Take your time and take a few normal breaths between deep breaths. The spirometer may include an indicator to show your best effort. Use the indicator as a goal to work toward during each repetition. After each set of 10 deep breaths, practice coughing to be sure your lungs are clear. If you have an incision (the cut made at  the time of surgery), support your incision when coughing by placing a pillow or rolled up towels firmly against it. Once you are able to get out of bed, walk around indoors and cough well. You may stop using the incentive spirometer when instructed by your caregiver.  RISKS AND COMPLICATIONS Take your time so you do not get dizzy or light-headed. If you are in pain, you may need to take or ask for pain medication before doing incentive spirometry. It is harder to take a deep breath if you are having pain. AFTER USE Rest and breathe slowly and easily. It can be helpful to keep track of a log of your progress. Your caregiver can provide you with a simple table to help with this. If you are using the spirometer at home, follow these instructions: [place] IF:  You are having difficultly using the spirometer. You have trouble using the spirometer as often as instructed. Your pain medication is not giving enough relief while using the spirometer. You develop fever of 100.5 F (38.1 C) or higher. SEEK IMMEDIATE MEDICAL CARE IF:  You cough up bloody sputum that had not been present before. You develop fever of 102 F (38.9 C) or greater. You develop worsening pain at or near the incision site. MAKE SURE YOU:  Understand these instructions. Will watch your condition. Will get help right away if you are not doing well or get worse. Document Released: 02/05/2007 Document Revised: 12/18/2011 Document Reviewed: 04/08/2007 ExitCare Patient Information 2014 ExitCare,  [place].   ________________________________________________________________________ WHAT IS A BLOOD TRANSFUSION? Blood Transfusion Information  A transfusion is the replacement of blood or some of its parts. Blood is made up of multiple cells which provide different functions. Red blood cells carry oxygen and are used for blood loss replacement. White blood cells fight against infection. Platelets control bleeding. Plasma helps clot blood. Other blood products are available for specialized needs, such as hemophilia or other clotting disorders. BEFORE THE TRANSFUSION  Who gives blood for transfusions?  Healthy volunteers who are fully evaluated to make sure their blood is safe. This is blood bank blood. Transfusion therapy is the safest it has ever been in the practice of medicine. Before blood is taken from a donor, a complete history is taken to make sure that person has no history of diseases nor engages in risky social behavior (examples are intravenous drug use or sexual activity with multiple partners). The donor's travel history is screened to minimize risk of transmitting infections, such as malaria. The donated blood is tested for signs of infectious diseases, such as HIV and hepatitis. The blood is then tested to be sure it is compatible with you in order to minimize the chance of a transfusion reaction. If you or a relative donates blood, this is often done in anticipation of surgery and is not appropriate for emergency situations. It takes many days to process the donated blood. RISKS AND COMPLICATIONS Although transfusion therapy is very safe and saves many lives, the main dangers of transfusion include:  Getting an infectious disease. Developing a transfusion reaction. This is an allergic reaction to something in the blood you were given. Every precaution is taken to prevent this. The decision to have a blood transfusion has been considered carefully by your caregiver before blood is  given. Blood is not given unless the benefits outweigh the risks. AFTER THE TRANSFUSION Right after receiving a blood transfusion, you will usually feel much better and more energetic. This is especially true  if your red blood cells have gotten low (anemic). The transfusion raises the level of the red blood cells which carry oxygen, and this usually causes an energy increase. The nurse administering the transfusion will monitor you carefully for complications. HOME CARE INSTRUCTIONS  No special instructions are needed after a transfusion. You may find your energy is better. Speak with your caregiver about any limitations on activity for underlying diseases you may have. SEEK MEDICAL CARE IF:  Your condition is not improving after your transfusion. You develop redness or irritation at the intravenous (IV) site. SEEK IMMEDIATE MEDICAL CARE IF:  Any of the following symptoms occur over the next 12 hours: Shaking chills. You have a temperature by mouth above 102 F (38.9 C), not controlled by medicine. Chest, back, or muscle pain. People around you feel you are not acting correctly or are confused. Shortness of breath or difficulty breathing. Dizziness and fainting. You get a rash or develop hives. You have a decrease in urine output. Your urine turns a dark color or changes to pink, red, or brown. Any of the following symptoms occur over the next 10 days: You have a temperature by mouth above 102 F (38.9 C), not controlled by medicine. Shortness of breath. Weakness after normal activity. The white part of the eye turns yellow (jaundice). You have a decrease in the amount of urine or are urinating less often. Your urine turns a dark color or changes to pink, red, or brown. Document Released: 09/22/2000 Document Revised: 12/18/2011 Document Reviewed: 05/11/2008 [facility] Patient Information 2014 [place], [place].  _______________________________________________________________________

## 2022-11-30 NOTE — Progress Notes (Addendum)
COVID Vaccine Completed: yes  Date of COVID positive in last 90 days: no  PCP - Carol Ada, MD Cardiologist - Shelva Majestic, MD  Medical/cardiac clearance by Carol Ada 09/18/22 on chart  Chest x-ray - n/a EKG - 11/08/22 Epic Stress Test - 2013 ECHO - 2021 Cardiac Cath - n/a Pacemaker/ICD device last checked: n/a Spinal Cord Stimulator: n/a  Bowel Prep - no  Sleep Study - n/a CPAP -   Fasting Blood Sugar - no longer per pt Checks Blood Sugar _____ times a day  Last dose of GLP1 agonist-  N/A GLP1 instructions:  N/A   Last dose of SGLT-2 inhibitors-  N/A SGLT-2 instructions: N/A   Blood Thinner Instructions: n/a Aspirin Instructions: Last Dose:  Activity level: Can go up a flight of stairs and perform activities of daily living without stopping and without symptoms of chest pain or shortness of breath.   Anesthesia review: HTN, mitral valve disorder, dysrhythmia, palpitations   Patient denies shortness of breath, fever, cough and chest pain at PAT appointment  Patient verbalized understanding of instructions that were given to them at the PAT appointment. Patient was also instructed that they will need to review over the PAT instructions again at home before surgery.

## 2022-12-04 ENCOUNTER — Encounter (HOSPITAL_COMMUNITY)
Admission: RE | Admit: 2022-12-04 | Discharge: 2022-12-04 | Disposition: A | Discharge status: Home / Self Care | Payer: Medicare Other | Source: Ambulatory Visit | Attending: Orthopedic Surgery | Admitting: Orthopedic Surgery

## 2022-12-04 ENCOUNTER — Encounter (HOSPITAL_COMMUNITY): Payer: Self-pay

## 2022-12-04 VITALS — BP 128/65 | HR 73 | Temp 98.6°F | Resp 14 | Ht 61.0 in | Wt 149.0 lb

## 2022-12-04 DIAGNOSIS — I10 Essential (primary) hypertension: Secondary | ICD-10-CM | POA: Insufficient documentation

## 2022-12-04 DIAGNOSIS — Z01812 Encounter for preprocedural laboratory examination: Secondary | ICD-10-CM | POA: Insufficient documentation

## 2022-12-04 DIAGNOSIS — Y828 Other medical devices associated with adverse incidents: Secondary | ICD-10-CM | POA: Diagnosis not present

## 2022-12-04 DIAGNOSIS — T84032A Mechanical loosening of internal right knee prosthetic joint, initial encounter: Secondary | ICD-10-CM | POA: Diagnosis not present

## 2022-12-04 DIAGNOSIS — R7303 Prediabetes: Secondary | ICD-10-CM | POA: Insufficient documentation

## 2022-12-04 DIAGNOSIS — Z01818 Encounter for other preprocedural examination: Secondary | ICD-10-CM

## 2022-12-04 LAB — BASIC METABOLIC PANEL
Anion gap: 8 (ref 5–15)
BUN: 8 mg/dL (ref 8–23)
CO2: 28 mmol/L (ref 22–32)
Calcium: 9.3 mg/dL (ref 8.9–10.3)
Chloride: 104 mmol/L (ref 98–111)
Creatinine, Ser: 0.41 mg/dL — ABNORMAL LOW (ref 0.44–1.00)
GFR, Estimated: >60 mL/min (ref >60)
Glucose, Bld: 107 mg/dL — ABNORMAL HIGH (ref 70–99)
Potassium: 4.2 mmol/L (ref 3.5–5.1)
Sodium: 140 mmol/L (ref 135–145)

## 2022-12-04 LAB — CBC
HCT: 40.8 % (ref 36.0–46.0)
Hemoglobin: 13.2 g/dL (ref 12.0–15.0)
MCH: 29.7 pg (ref 26.0–34.0)
MCHC: 32.4 g/dL (ref 30.0–36.0)
MCV: 91.9 fL (ref 80.0–100.0)
Platelets: 248 10*3/uL (ref 150–400)
RBC: 4.44 MIL/uL (ref 3.87–5.11)
RDW: 13.8 % (ref 11.5–15.5)
WBC: 4.7 10*3/uL (ref 4.0–10.5)
nRBC: 0 % (ref 0.0–0.2)

## 2022-12-04 LAB — SURGICAL PCR SCREEN
MRSA, PCR: NEGATIVE
Staphylococcus aureus: NEGATIVE

## 2022-12-05 LAB — HEMOGLOBIN A1C
Hgb A1c MFr Bld: 5.1 % (ref 4.8–5.6)
Mean Plasma Glucose: 100 mg/dL

## 2022-12-05 NOTE — Progress Notes (Signed)
Anesthesia Chart Review   Case: [id number] Date/Time: 12/13/22 1015   Procedure: TOTAL KNEE REVISION (Right: Knee)   Anesthesia type: Choice   Pre-op diagnosis: Right total knee arthroplasty loosening   Location: [facility] ROOM 09 / [facility]   Surgeons: [name], MD       DISCUSSION:66 y.o. never smoker with h/o PONV, HTN, CLL in remission, breast cancer, PVCs, right total knee arthroplasty loosening scheduled for above procedure 12/13/2022 with Dr. [name].   Pt last seen by cardiology 11/08/2022. Per OV note, "She will require repeat total knee revision to her right knee by Dr. Reynaldo Minium in March. She is stable from a cardiac standpoint."    Clearance from PCP on chart.   Anticipate pt can proceed with planned procedure barring acute status change.   VS: BP 128/65   Pulse 73   Temp 37 C (Oral)   Resp 14   Ht '5\' 1"'$  (1.549 m)   Wt 67.6 kg   LMP 11/19/2011 (LMP Unknown)   SpO2 100%   BMI 28.15 kg/m   PROVIDERS: Carol Ada, MD is PCP   Shelva Majestic, MD is Cardiologist  LABS: Labs reviewed: Acceptable for surgery. (all labs ordered are listed, but only abnormal results are displayed)  Labs Reviewed  BASIC METABOLIC PANEL - Abnormal; Notable for the following components:      Result Value   Glucose, Bld 107 (*)    Creatinine, Ser 0.41 (*)    All other components within normal limits  SURGICAL PCR SCREEN  HEMOGLOBIN A1C  CBC  TYPE AND SCREEN     IMAGES:   EKG:   CV:  Past Medical History:  Diagnosis Date   Arthritis    Breast CA ([place]) dx'd 2015   left   Bronchiectasis ([place])    Cancer ([facility])    hx of skin cancer    CLL (chronic lymphocytic leukemia) ([place]) dx'd 2015   Dyspnea    Dysrhythmia    PVCs   Elevated cholesterol 05/22/2012   Nuc stress test-Normal.   Endometrial polyp    GERD (gastroesophageal reflux disease)    Grade I diastolic dysfunction    noted on echo   History of palpitations    Hypertension 05/22/12   ECHO-EF >55% trace  tricupsid regurgitation. There is mild mitral regurgitation.    MR (mitral regurgitation)    mild noted on echo   Plantar fasciitis of left foot    resolved   Pneumonia    history of x 2   PONV (postoperative nausea and vomiting)     Past Surgical History:  Procedure Laterality Date   ABDOMINAL SURGERY     Abdominoplasty   BRONCHIAL BIOPSY  06/29/2020   Procedure: BRONCHIAL BIOPSIES;  Surgeon: Collene Gobble, MD;  Location: [place];  Service: Cardiopulmonary;;   BRONCHIAL BRUSHINGS  06/29/2020   Procedure: BRONCHIAL BRUSHINGS;  Surgeon: Collene Gobble, MD;  Location: [place];  Service: Cardiopulmonary;;   BRONCHIAL WASHINGS  06/29/2020   Procedure: BRONCHIAL WASHINGS;  Surgeon: Collene Gobble, MD;  Location: [facility];  Service: Cardiopulmonary;;   CESAREAN SECTION     X 2   CHOLECYSTECTOMY     COLONSCOPY     DILATION AND CURETTAGE OF UTERUS     FOOT SURGERY Left    HYSTEROSCOPY     KNEE ARTHROTOMY Right 06/04/2019   Procedure: Right knee arthrotomy; scar excision;  Surgeon: [name], MD;  [facility];  Service: Orthopedics;  Laterality: Right;  26mn   KNEE CLOSED REDUCTION Right 07/20/2014   Procedure: RIGHT KNEE CLOSED MANIPULATION;  Surgeon: FGearlean Alf MD;  [facility];  Service: Orthopedics;  Laterality: Right;   KNEE SURGERY Right    arthroscopic   PARTIAL KNEE ARTHROPLASTY Left 2017   RADIOACTIVE SEED GUIDED EXCISIONAL BREAST BIOPSY N/A 10/06/2014   Procedure: RADIOACTIVE SEED GUIDED EXCISIONAL BREAST BIOPSY;  Surgeon: MRolm Bookbinder MD;  [facility]  Service: General;  Laterality: N/A;   REFRACTIVE SURGERY     TOTAL KNEE ARTHROPLASTY Right 05/11/2014   Procedure: RIGHT TOTAL KNEE ARTHROPLASTY;  Surgeon: FGearlean Alf MD;  [facility];  Service: Orthopedics;  Laterality: Right;   TUBAL LIGATION     VIDEO BRONCHOSCOPY N/A 06/29/2020   Procedure: VIDEO BRONCHOSCOPY WITHOUT FLUORO;  Surgeon: BCollene Gobble MD;  [facility]  Service: Cardiopulmonary;  Laterality: N/A;  with BAL   WRIST SURGERY Left     MEDICATIONS:  acetaminophen (TYLENOL) 500 MG tablet   albuterol (VENTOLIN HFA) 108 (90 Base) MCG/ACT inhaler   amLODipine (NORVASC) 5 MG tablet   atorvastatin (LIPITOR) 20 MG tablet   B Complex Vitamins (B COMPLEX 1 PO)   Calcium Carb-Cholecalciferol (CALCIUM-VITAMIN D3) 600-400 MG-UNIT TABS   calcium carbonate (TUMS - DOSED IN MG ELEMENTAL CALCIUM) 500 MG chewable tablet   carvedilol (COREG) 12.5 MG tablet   cholecalciferol (VITAMIN D3) 25 MCG (1000 UNIT) tablet   Cyanocobalamin (B-12) 2500 MCG TABS   ezetimibe (ZETIA) 10 MG tablet   famotidine (PEPCID) 40 MG tablet   meloxicam (MOBIC) 7.5 MG tablet   Multiple Vitamin (MULTIVITAMIN WITH MINERALS) TABS tablet   Probiotic Product (PROBIOTIC PO)   Zinc Oxide-Vitamin C (ZINC PLUS VITAMIN C PO)   No current facility-administered medications for this encounter.   JKonrad FelixWard, PA-C [facility] [phone]

## 2022-12-05 NOTE — Anesthesia Preprocedure Evaluation (Addendum)
Anesthesia Evaluation  Patient identified by MRN, date of birth, ID band Patient awake    Reviewed: Allergy & Precautions, H&P , NPO status , Patient's Chart, lab work & pertinent test results, reviewed documented beta blocker date and time   History of Anesthesia Complications (+) PONV and history of anesthetic complications  Airway Mallampati: I  TM Distance: >3 FB Neck ROM: Full    Dental no notable dental hx. (+) Teeth Intact, Dental Advisory Given   Pulmonary shortness of breath   Pulmonary exam normal breath sounds clear to auscultation       Cardiovascular hypertension, Pt. on medications and Pt. on home beta blockers Normal cardiovascular exam+ dysrhythmias + Valvular Problems/Murmurs MR  Rhythm:Regular Rate:Normal     Neuro/Psych negative neurological ROS  negative psych ROS   GI/Hepatic Neg liver ROS,GERD  Medicated and Controlled,,  Endo/Other  negative endocrine ROS    Renal/GU negative Renal ROS  negative genitourinary   Musculoskeletal  (+) Arthritis , Osteoarthritis,    Abdominal Normal abdominal exam  (+)   Peds  Hematology negative hematology ROS (+)   Anesthesia Other Findings   Reproductive/Obstetrics negative OB ROS                             Anesthesia Physical Anesthesia Plan  ASA: 2  Anesthesia Plan: Spinal   Post-op Pain Management: Regional block*   Induction:   PONV Risk Score and Plan: 4 or greater and Ondansetron, Dexamethasone, Midazolam and Propofol infusion  Airway Management Planned: Natural Airway and Simple Face Mask  Additional Equipment: None  Intra-op Plan:   Post-operative Plan: Extubation in OR  Informed Consent: I have reviewed the patients History and Physical, chart, labs and discussed the procedure including the risks, benefits and alternatives for the proposed anesthesia with the patient or authorized representative who has  indicated his/her understanding and acceptance.     Dental advisory given  Plan Discussed with: CRNA  Anesthesia Plan Comments: (See PAT note 12/04/2022)         Anesthesia Quick Evaluation

## 2022-12-07 DIAGNOSIS — M654 Radial styloid tenosynovitis [de Quervain]: Secondary | ICD-10-CM | POA: Diagnosis not present

## 2022-12-13 ENCOUNTER — Other Ambulatory Visit: Payer: Self-pay

## 2022-12-13 ENCOUNTER — Inpatient Hospital Stay (HOSPITAL_COMMUNITY): Payer: Medicare Other | Admitting: Physician Assistant

## 2022-12-13 ENCOUNTER — Inpatient Hospital Stay (HOSPITAL_COMMUNITY): Payer: Medicare Other | Admitting: Registered Nurse

## 2022-12-13 ENCOUNTER — Encounter (HOSPITAL_COMMUNITY): Payer: Self-pay | Admitting: Orthopedic Surgery

## 2022-12-13 ENCOUNTER — Inpatient Hospital Stay (HOSPITAL_COMMUNITY)
Admission: RE | Admit: 2022-12-13 | Discharge: 2022-12-14 | DRG: 468 | Disposition: A | Discharge status: Home / Self Care | Payer: Medicare Other | Source: Ambulatory Visit | Attending: Orthopedic Surgery | Admitting: Orthopedic Surgery

## 2022-12-13 ENCOUNTER — Encounter (HOSPITAL_COMMUNITY)
Admission: RE | Disposition: A | Discharge status: Home / Self Care | Payer: Self-pay | Source: Ambulatory Visit | Attending: Orthopedic Surgery

## 2022-12-13 DIAGNOSIS — K219 Gastro-esophageal reflux disease without esophagitis: Secondary | ICD-10-CM | POA: Diagnosis not present

## 2022-12-13 DIAGNOSIS — Z79899 Other long term (current) drug therapy: Secondary | ICD-10-CM | POA: Diagnosis not present

## 2022-12-13 DIAGNOSIS — Z85828 Personal history of other malignant neoplasm of skin: Secondary | ICD-10-CM

## 2022-12-13 DIAGNOSIS — G8929 Other chronic pain: Secondary | ICD-10-CM | POA: Diagnosis present

## 2022-12-13 DIAGNOSIS — T84032A Mechanical loosening of internal right knee prosthetic joint, initial encounter: Principal | ICD-10-CM | POA: Diagnosis present

## 2022-12-13 DIAGNOSIS — I340 Nonrheumatic mitral (valve) insufficiency: Secondary | ICD-10-CM | POA: Diagnosis not present

## 2022-12-13 DIAGNOSIS — Y792 Prosthetic and other implants, materials and accessory orthopedic devices associated with adverse incidents: Secondary | ICD-10-CM | POA: Diagnosis present

## 2022-12-13 DIAGNOSIS — Z823 Family history of stroke: Secondary | ICD-10-CM

## 2022-12-13 DIAGNOSIS — Z820 Family history of epilepsy and other diseases of the nervous system: Secondary | ICD-10-CM

## 2022-12-13 DIAGNOSIS — Z6828 Body mass index (BMI) 28.0-28.9, adult: Secondary | ICD-10-CM | POA: Diagnosis not present

## 2022-12-13 DIAGNOSIS — Z8701 Personal history of pneumonia (recurrent): Secondary | ICD-10-CM | POA: Diagnosis not present

## 2022-12-13 DIAGNOSIS — T84018A Broken internal joint prosthesis, other site, initial encounter: Secondary | ICD-10-CM | POA: Diagnosis not present

## 2022-12-13 DIAGNOSIS — E7800 Pure hypercholesterolemia, unspecified: Secondary | ICD-10-CM | POA: Diagnosis present

## 2022-12-13 DIAGNOSIS — Z856 Personal history of leukemia: Secondary | ICD-10-CM

## 2022-12-13 DIAGNOSIS — I110 Hypertensive heart disease with heart failure: Secondary | ICD-10-CM | POA: Diagnosis not present

## 2022-12-13 DIAGNOSIS — I2510 Atherosclerotic heart disease of native coronary artery without angina pectoris: Secondary | ICD-10-CM | POA: Diagnosis present

## 2022-12-13 DIAGNOSIS — T8482XA Fibrosis due to internal orthopedic prosthetic devices, implants and grafts, initial encounter: Secondary | ICD-10-CM

## 2022-12-13 DIAGNOSIS — E669 Obesity, unspecified: Secondary | ICD-10-CM | POA: Diagnosis not present

## 2022-12-13 DIAGNOSIS — Z8249 Family history of ischemic heart disease and other diseases of the circulatory system: Secondary | ICD-10-CM | POA: Diagnosis not present

## 2022-12-13 DIAGNOSIS — M24661 Ankylosis, right knee: Secondary | ICD-10-CM | POA: Diagnosis not present

## 2022-12-13 DIAGNOSIS — Z83438 Family history of other disorder of lipoprotein metabolism and other lipidemia: Secondary | ICD-10-CM

## 2022-12-13 DIAGNOSIS — Z96651 Presence of right artificial knee joint: Secondary | ICD-10-CM | POA: Diagnosis not present

## 2022-12-13 DIAGNOSIS — M1711 Unilateral primary osteoarthritis, right knee: Secondary | ICD-10-CM | POA: Diagnosis not present

## 2022-12-13 DIAGNOSIS — G8918 Other acute postprocedural pain: Secondary | ICD-10-CM | POA: Diagnosis not present

## 2022-12-13 DIAGNOSIS — Z853 Personal history of malignant neoplasm of breast: Secondary | ICD-10-CM | POA: Diagnosis not present

## 2022-12-13 DIAGNOSIS — Z96659 Presence of unspecified artificial knee joint: Secondary | ICD-10-CM

## 2022-12-13 HISTORY — PX: TOTAL KNEE REVISION: SHX996

## 2022-12-13 LAB — TYPE AND SCREEN
ABO/RH(D): A POS
Antibody Screen: NEGATIVE

## 2022-12-13 SURGERY — TOTAL KNEE REVISION
Anesthesia: Spinal | Site: Knee | Laterality: Right

## 2022-12-13 MED ORDER — BUPIVACAINE LIPOSOME 1.3 % IJ SUSP
INTRAMUSCULAR | Status: DC | PRN
  Administered 2022-12-13: 20 mL

## 2022-12-13 MED ORDER — ONDANSETRON HCL 4 MG/2ML IJ SOLN
4.0000 mg | Freq: Four times a day (QID) | INTRAMUSCULAR | Status: DC | PRN
  Administered 2022-12-14: 4 mg via INTRAVENOUS
  Filled 2022-12-13: qty 2

## 2022-12-13 MED ORDER — ONDANSETRON HCL 4 MG/2ML IJ SOLN
INTRAMUSCULAR | Status: AC
  Filled 2022-12-13: qty 2

## 2022-12-13 MED ORDER — DOCUSATE SODIUM 100 MG PO CAPS
100.0000 mg | ORAL_CAPSULE | Freq: Two times a day (BID) | ORAL | Status: DC
  Administered 2022-12-13 – 2022-12-14 (×3): 100 mg via ORAL
  Filled 2022-12-13 (×3): qty 1

## 2022-12-13 MED ORDER — ORAL CARE MOUTH RINSE: 15.0000 mL | Freq: Once | OROMUCOSAL | Status: AC

## 2022-12-13 MED ORDER — DIPHENHYDRAMINE HCL 12.5 MG/5ML PO ELIX: 12.5000 mg | ORAL_SOLUTION | ORAL | Status: DC | PRN

## 2022-12-13 MED ORDER — SODIUM CHLORIDE 0.9 % IV SOLN: INTRAVENOUS | Status: DC

## 2022-12-13 MED ORDER — PROPOFOL 500 MG/50ML IV EMUL
INTRAVENOUS | Status: DC | PRN
  Administered 2022-12-13: 40 ug/kg/min via INTRAVENOUS

## 2022-12-13 MED ORDER — METHOCARBAMOL 500 MG IVPB - SIMPLE MED
INTRAVENOUS | Status: AC
  Filled 2022-12-13: qty 55

## 2022-12-13 MED ORDER — CEFAZOLIN SODIUM-DEXTROSE 2-4 GM/100ML-% IV SOLN
2.0000 g | INTRAVENOUS | Status: AC
  Administered 2022-12-13: 2 g via INTRAVENOUS
  Filled 2022-12-13: qty 100

## 2022-12-13 MED ORDER — HYDROMORPHONE HCL 1 MG/ML IJ SOLN
INTRAMUSCULAR | Status: AC
  Filled 2022-12-13: qty 1

## 2022-12-13 MED ORDER — METHOCARBAMOL 500 MG PO TABS
500.0000 mg | ORAL_TABLET | Freq: Four times a day (QID) | ORAL | Status: DC | PRN
  Administered 2022-12-13 – 2022-12-14 (×4): 500 mg via ORAL
  Filled 2022-12-13 (×4): qty 1

## 2022-12-13 MED ORDER — ROPIVACAINE HCL 5 MG/ML IJ SOLN
INTRAMUSCULAR | Status: DC | PRN
  Administered 2022-12-13 (×6): 5 mL via PERINEURAL

## 2022-12-13 MED ORDER — MEPERIDINE HCL 50 MG/ML IJ SOLN: 6.2500 mg | INTRAMUSCULAR | Status: DC | PRN

## 2022-12-13 MED ORDER — CARVEDILOL 6.25 MG PO TABS
6.2500 mg | ORAL_TABLET | Freq: Two times a day (BID) | ORAL | Status: DC
  Administered 2022-12-13 – 2022-12-14 (×2): 6.25 mg via ORAL
  Filled 2022-12-13 (×2): qty 1

## 2022-12-13 MED ORDER — FAMOTIDINE 20 MG PO TABS
40.0000 mg | ORAL_TABLET | Freq: Two times a day (BID) | ORAL | Status: DC
  Administered 2022-12-13 – 2022-12-14 (×2): 40 mg via ORAL
  Filled 2022-12-13 (×2): qty 2

## 2022-12-13 MED ORDER — KETOROLAC TROMETHAMINE 30 MG/ML IJ SOLN: 30.0000 mg | Freq: Once | INTRAMUSCULAR | Status: DC | PRN

## 2022-12-13 MED ORDER — PROPOFOL 10 MG/ML IV BOLUS
INTRAVENOUS | Status: DC | PRN
  Administered 2022-12-13: 10 mg via INTRAVENOUS
  Administered 2022-12-13: 20 mg via INTRAVENOUS

## 2022-12-13 MED ORDER — PROPOFOL 1000 MG/100ML IV EMUL
INTRAVENOUS | Status: AC
  Filled 2022-12-13: qty 100

## 2022-12-13 MED ORDER — FENTANYL CITRATE PF 50 MCG/ML IJ SOSY
50.0000 ug | PREFILLED_SYRINGE | Freq: Once | INTRAMUSCULAR | Status: AC
  Administered 2022-12-13: 50 ug via INTRAVENOUS
  Filled 2022-12-13: qty 2

## 2022-12-13 MED ORDER — HYDROMORPHONE HCL 1 MG/ML IJ SOLN
0.2500 mg | INTRAMUSCULAR | Status: DC | PRN
  Administered 2022-12-13: 0.5 mg via INTRAVENOUS

## 2022-12-13 MED ORDER — BUPIVACAINE LIPOSOME 1.3 % IJ SUSP
INTRAMUSCULAR | Status: AC
  Filled 2022-12-13: qty 20

## 2022-12-13 MED ORDER — METHOCARBAMOL 500 MG IVPB - SIMPLE MED
500.0000 mg | Freq: Four times a day (QID) | INTRAVENOUS | Status: DC | PRN
  Administered 2022-12-13: 500 mg via INTRAVENOUS

## 2022-12-13 MED ORDER — SODIUM CHLORIDE (PF) 0.9 % IJ SOLN
INTRAMUSCULAR | Status: DC | PRN
  Administered 2022-12-13: 60 mL

## 2022-12-13 MED ORDER — MORPHINE SULFATE (PF) 2 MG/ML IV SOLN
1.0000 mg | INTRAVENOUS | Status: DC | PRN
  Administered 2022-12-13: 1 mg via INTRAVENOUS
  Administered 2022-12-13: 2 mg via INTRAVENOUS
  Filled 2022-12-13 (×2): qty 1

## 2022-12-13 MED ORDER — METOCLOPRAMIDE HCL 5 MG PO TABS: 5.0000 mg | ORAL_TABLET | Freq: Three times a day (TID) | ORAL | Status: DC | PRN

## 2022-12-13 MED ORDER — PHENYLEPHRINE HCL-NACL 20-0.9 MG/250ML-% IV SOLN
INTRAVENOUS | Status: DC | PRN
  Administered 2022-12-13: 25 ug/min via INTRAVENOUS

## 2022-12-13 MED ORDER — DEXAMETHASONE SODIUM PHOSPHATE 10 MG/ML IJ SOLN
10.0000 mg | Freq: Once | INTRAMUSCULAR | Status: AC
  Administered 2022-12-14: 10 mg via INTRAVENOUS
  Filled 2022-12-13: qty 1

## 2022-12-13 MED ORDER — DEXAMETHASONE SODIUM PHOSPHATE 10 MG/ML IJ SOLN
INTRAMUSCULAR | Status: AC
  Filled 2022-12-13: qty 1

## 2022-12-13 MED ORDER — RIVAROXABAN 10 MG PO TABS
10.0000 mg | ORAL_TABLET | Freq: Every day | ORAL | Status: DC
  Administered 2022-12-14: 10 mg via ORAL
  Filled 2022-12-13: qty 1

## 2022-12-13 MED ORDER — AMISULPRIDE (ANTIEMETIC) 5 MG/2ML IV SOLN
INTRAVENOUS | Status: AC
  Filled 2022-12-13: qty 4

## 2022-12-13 MED ORDER — AMLODIPINE BESYLATE 5 MG PO TABS
5.0000 mg | ORAL_TABLET | Freq: Every day | ORAL | Status: DC
  Administered 2022-12-13: 5 mg via ORAL
  Filled 2022-12-13: qty 1

## 2022-12-13 MED ORDER — ALBUTEROL SULFATE (2.5 MG/3ML) 0.083% IN NEBU: 3.0000 mL | INHALATION_SOLUTION | Freq: Four times a day (QID) | RESPIRATORY_TRACT | Status: DC | PRN

## 2022-12-13 MED ORDER — OXYCODONE HCL 5 MG PO TABS
5.0000 mg | ORAL_TABLET | ORAL | Status: DC | PRN
  Administered 2022-12-13: 10 mg via ORAL
  Administered 2022-12-13 (×2): 5 mg via ORAL
  Administered 2022-12-13 – 2022-12-14 (×4): 10 mg via ORAL
  Filled 2022-12-13 (×4): qty 2
  Filled 2022-12-13 (×2): qty 1
  Filled 2022-12-13: qty 2

## 2022-12-13 MED ORDER — EPHEDRINE 5 MG/ML INJ
INTRAVENOUS | Status: AC
  Filled 2022-12-13: qty 5

## 2022-12-13 MED ORDER — PROMETHAZINE HCL 25 MG/ML IJ SOLN
6.2500 mg | INTRAMUSCULAR | Status: DC | PRN
  Administered 2022-12-13: 6.25 mg via INTRAVENOUS

## 2022-12-13 MED ORDER — BUPIVACAINE LIPOSOME 1.3 % IJ SUSP: 20.0000 mL | Freq: Once | INTRAMUSCULAR | Status: DC

## 2022-12-13 MED ORDER — MIDAZOLAM HCL 2 MG/2ML IJ SOLN
1.0000 mg | Freq: Once | INTRAMUSCULAR | Status: AC
  Administered 2022-12-13: 2 mg via INTRAVENOUS
  Filled 2022-12-13: qty 2

## 2022-12-13 MED ORDER — CLONIDINE HCL (ANALGESIA) 100 MCG/ML EP SOLN
EPIDURAL | Status: DC | PRN
  Administered 2022-12-13: 100 ug

## 2022-12-13 MED ORDER — LACTATED RINGERS IV SOLN: INTRAVENOUS | Status: DC

## 2022-12-13 MED ORDER — MENTHOL 3 MG MT LOZG: 1.0000 | LOZENGE | OROMUCOSAL | Status: DC | PRN

## 2022-12-13 MED ORDER — ACETAMINOPHEN 10 MG/ML IV SOLN
1000.0000 mg | Freq: Four times a day (QID) | INTRAVENOUS | Status: DC
  Administered 2022-12-13: 1000 mg via INTRAVENOUS
  Filled 2022-12-13: qty 100

## 2022-12-13 MED ORDER — TRANEXAMIC ACID-NACL 1000-0.7 MG/100ML-% IV SOLN
1000.0000 mg | INTRAVENOUS | Status: AC
  Administered 2022-12-13: 1000 mg via INTRAVENOUS
  Filled 2022-12-13: qty 100

## 2022-12-13 MED ORDER — FLEET ENEMA 7-19 GM/118ML RE ENEM: 1.0000 | ENEMA | Freq: Once | RECTAL | Status: DC | PRN

## 2022-12-13 MED ORDER — ONDANSETRON HCL 4 MG/2ML IJ SOLN
INTRAMUSCULAR | Status: DC | PRN
  Administered 2022-12-13: 4 mg via INTRAVENOUS

## 2022-12-13 MED ORDER — GABAPENTIN 300 MG PO CAPS
300.0000 mg | ORAL_CAPSULE | Freq: Three times a day (TID) | ORAL | Status: DC
  Administered 2022-12-13 – 2022-12-14 (×3): 300 mg via ORAL
  Filled 2022-12-13 (×3): qty 1

## 2022-12-13 MED ORDER — ONDANSETRON HCL 4 MG PO TABS
4.0000 mg | ORAL_TABLET | Freq: Four times a day (QID) | ORAL | Status: DC | PRN
  Administered 2022-12-13: 4 mg via ORAL
  Filled 2022-12-13: qty 1

## 2022-12-13 MED ORDER — EPHEDRINE SULFATE-NACL 50-0.9 MG/10ML-% IV SOSY
PREFILLED_SYRINGE | INTRAVENOUS | Status: DC | PRN
  Administered 2022-12-13: 5 mg via INTRAVENOUS

## 2022-12-13 MED ORDER — SODIUM CHLORIDE (PF) 0.9 % IJ SOLN
INTRAMUSCULAR | Status: AC
  Filled 2022-12-13: qty 50

## 2022-12-13 MED ORDER — 0.9 % SODIUM CHLORIDE (POUR BTL) OPTIME
TOPICAL | Status: DC | PRN
  Administered 2022-12-13: 1000 mL

## 2022-12-13 MED ORDER — BISACODYL 10 MG RE SUPP: 10.0000 mg | Freq: Every day | RECTAL | Status: DC | PRN

## 2022-12-13 MED ORDER — DEXAMETHASONE SODIUM PHOSPHATE 10 MG/ML IJ SOLN
8.0000 mg | Freq: Once | INTRAMUSCULAR | Status: AC
  Administered 2022-12-13: 10 mg via INTRAVENOUS
  Filled 2022-12-13: qty 1

## 2022-12-13 MED ORDER — CHLORHEXIDINE GLUCONATE 0.12 % MT SOLN
15.0000 mL | Freq: Once | OROMUCOSAL | Status: AC
  Administered 2022-12-13: 15 mL via OROMUCOSAL

## 2022-12-13 MED ORDER — STERILE WATER FOR IRRIGATION IR SOLN
Status: DC | PRN
  Administered 2022-12-13: 2000 mL

## 2022-12-13 MED ORDER — ACETAMINOPHEN 500 MG PO TABS
1000.0000 mg | ORAL_TABLET | Freq: Four times a day (QID) | ORAL | Status: AC
  Administered 2022-12-13 – 2022-12-14 (×4): 1000 mg via ORAL
  Filled 2022-12-13 (×4): qty 2

## 2022-12-13 MED ORDER — CEFAZOLIN SODIUM-DEXTROSE 2-4 GM/100ML-% IV SOLN
2.0000 g | Freq: Four times a day (QID) | INTRAVENOUS | Status: AC
  Administered 2022-12-13 (×2): 2 g via INTRAVENOUS
  Filled 2022-12-13 (×2): qty 100

## 2022-12-13 MED ORDER — PHENOL 1.4 % MT LIQD: 1.0000 | OROMUCOSAL | Status: DC | PRN

## 2022-12-13 MED ORDER — EZETIMIBE 10 MG PO TABS
10.0000 mg | ORAL_TABLET | Freq: Every day | ORAL | Status: DC
  Administered 2022-12-13: 10 mg via ORAL
  Filled 2022-12-13: qty 1

## 2022-12-13 MED ORDER — ATORVASTATIN CALCIUM 20 MG PO TABS
20.0000 mg | ORAL_TABLET | Freq: Every day | ORAL | Status: DC
  Administered 2022-12-13: 20 mg via ORAL
  Filled 2022-12-13: qty 1

## 2022-12-13 MED ORDER — PROMETHAZINE HCL 25 MG/ML IJ SOLN
INTRAMUSCULAR | Status: AC
  Filled 2022-12-13: qty 1

## 2022-12-13 MED ORDER — TRAMADOL HCL 50 MG PO TABS
50.0000 mg | ORAL_TABLET | Freq: Four times a day (QID) | ORAL | Status: DC | PRN
  Administered 2022-12-13 – 2022-12-14 (×3): 100 mg via ORAL
  Filled 2022-12-13 (×3): qty 2

## 2022-12-13 MED ORDER — POLYETHYLENE GLYCOL 3350 17 G PO PACK: 17.0000 g | PACK | Freq: Every day | ORAL | Status: DC | PRN

## 2022-12-13 MED ORDER — METOCLOPRAMIDE HCL 5 MG/ML IJ SOLN: 5.0000 mg | Freq: Three times a day (TID) | INTRAMUSCULAR | Status: DC | PRN

## 2022-12-13 MED ORDER — SODIUM CHLORIDE (PF) 0.9 % IJ SOLN
INTRAMUSCULAR | Status: AC
  Filled 2022-12-13: qty 10

## 2022-12-13 MED ORDER — DEXAMETHASONE SODIUM PHOSPHATE 10 MG/ML IJ SOLN
INTRAMUSCULAR | Status: DC | PRN
  Administered 2022-12-13: 10 mg

## 2022-12-13 MED ORDER — POVIDONE-IODINE 10 % EX SWAB: 2.0000 | Freq: Once | CUTANEOUS | Status: DC

## 2022-12-13 SURGICAL SUPPLY — 65 items
AUG FEM SZ3 4 REV POST STRL LF (Miscellaneous) ×2 IMPLANT
AUGMENT POS FEM S3 4 (Miscellaneous) IMPLANT
BAG COUNTER SPONGE SURGICOUNT (BAG) IMPLANT
BAG SPEC THK2 15X12 ZIP CLS (MISCELLANEOUS)
BAG SPNG CNTER NS LX DISP (BAG)
BAG ZIPLOCK 12X15 (MISCELLANEOUS) IMPLANT
BLADE SAG 18X100X1.27 (BLADE) ×2 IMPLANT
BLADE SAW SGTL 11.0X1.19X90.0M (BLADE) ×2 IMPLANT
BLADE SURG SZ10 CARB STEEL (BLADE) IMPLANT
BNDG CMPR 5X62 HK CLSR LF (GAUZE/BANDAGES/DRESSINGS) ×1
BNDG CMPR MED 10X6 ELC LF (GAUZE/BANDAGES/DRESSINGS) ×1
BNDG ELASTIC 6INX 5YD STR LF (GAUZE/BANDAGES/DRESSINGS) ×2 IMPLANT
BNDG ELASTIC 6X10 VLCR STRL LF (GAUZE/BANDAGES/DRESSINGS) IMPLANT
BONE CEMENT GENTAMICIN (Cement) ×2 IMPLANT
BSPLAT TIB 2 CMNT REV ROT PLAT (Orthopedic Implant) ×1 IMPLANT
CEMENT BONE GENTAMICIN 40 (Cement) ×6 IMPLANT
COMP FEM ATTUNE CRS CEM RT SZ3 (Femur) ×1 IMPLANT
COMPONENT FEM ATN CR CEM RTSZ3 (Femur) IMPLANT
COVER SURGICAL LIGHT HANDLE (MISCELLANEOUS) ×2 IMPLANT
CUFF TOURN SGL QUICK 34 (TOURNIQUET CUFF) ×1
CUFF TRNQT CYL 34X4.125X (TOURNIQUET CUFF) ×2 IMPLANT
DRAPE U-SHAPE 47X51 STRL (DRAPES) ×2 IMPLANT
DRSG AQUACEL AG ADV 3.5X10 (GAUZE/BANDAGES/DRESSINGS) ×2 IMPLANT
DURAPREP 26ML APPLICATOR (WOUND CARE) ×2 IMPLANT
ELECT REM PT RETURN 15FT ADLT (MISCELLANEOUS) ×2 IMPLANT
GLOVE BIO SURGEON STRL SZ 6.5 (GLOVE) IMPLANT
GLOVE BIO SURGEON STRL SZ7 (GLOVE) IMPLANT
GLOVE BIO SURGEON STRL SZ8 (GLOVE) ×4 IMPLANT
GLOVE BIOGEL PI IND STRL 7.0 (GLOVE) IMPLANT
GLOVE BIOGEL PI IND STRL 8 (GLOVE) ×2 IMPLANT
GOWN STRL REUS W/ TWL LRG LVL3 (GOWN DISPOSABLE) ×2 IMPLANT
GOWN STRL REUS W/TWL LRG LVL3 (GOWN DISPOSABLE) ×1
HANDPIECE INTERPULSE COAX TIP (DISPOSABLE) ×1
HOLDER FOLEY CATH W/STRAP (MISCELLANEOUS) IMPLANT
IMMOBILIZER KNEE 20 (SOFTGOODS) ×1
IMMOBILIZER KNEE 20 THIGH 36 (SOFTGOODS) ×2 IMPLANT
INSERT TIB CRS ATTUNE SZ3 20 (Insert) IMPLANT
KIT TURNOVER KIT A (KITS) IMPLANT
MANIFOLD NEPTUNE II (INSTRUMENTS) ×2 IMPLANT
NS IRRIG 1000ML POUR BTL (IV SOLUTION) ×2 IMPLANT
PACK TOTAL KNEE CUSTOM (KITS) ×2 IMPLANT
PADDING CAST COTTON 6X4 STRL (CAST SUPPLIES) ×4 IMPLANT
PIN STEINMAN FIXATION KNEE (PIN) IMPLANT
PLAT REV TIB BASE ROT SZ2 KNEE (Orthopedic Implant) ×1 IMPLANT
PLATE REV TIB BAS ROT SZ2 KNEE (Orthopedic Implant) IMPLANT
PROTECTOR NERVE ULNAR (MISCELLANEOUS) ×2 IMPLANT
SET HNDPC FAN SPRY TIP SCT (DISPOSABLE) ×2 IMPLANT
SLEEVE KNEE ATTUNE 29MM (Knees) IMPLANT
SPIKE FLUID TRANSFER (MISCELLANEOUS) IMPLANT
STEM STR ATTUNE PF 12X160 (Knees) IMPLANT
STEM STR ATTUNE PF 16X60 (Knees) IMPLANT
STRIP CLOSURE SKIN 1/2X4 (GAUZE/BANDAGES/DRESSINGS) ×4 IMPLANT
SUT MNCRL AB 4-0 PS2 18 (SUTURE) ×2 IMPLANT
SUT STRATAFIX 0 PDS 27 VIOLET (SUTURE) ×1
SUT VIC AB 2-0 CT1 27 (SUTURE) ×3
SUT VIC AB 2-0 CT1 TAPERPNT 27 (SUTURE) ×6 IMPLANT
SUTURE STRATFX 0 PDS 27 VIOLET (SUTURE) ×2 IMPLANT
SWAB COLLECTION DEVICE MRSA (MISCELLANEOUS) IMPLANT
SWAB CULTURE ESWAB REG 1ML (MISCELLANEOUS) IMPLANT
TOWER CARTRIDGE SMART MIX (DISPOSABLE) ×2 IMPLANT
TRAY FOLEY MTR SLVR 16FR STAT (SET/KITS/TRAYS/PACK) ×2 IMPLANT
TUBE KAMVAC SUCTION (TUBING) IMPLANT
TUBE SUCTION HIGH CAP CLEAR NV (SUCTIONS) ×2 IMPLANT
WATER STERILE IRR 1000ML POUR (IV SOLUTION) ×2 IMPLANT
WRAP KNEE MAXI GEL POST OP (GAUZE/BANDAGES/DRESSINGS) IMPLANT

## 2022-12-13 NOTE — Anesthesia Procedure Notes (Signed)
Anesthesia Regional Block: Adductor canal block   Pre-Anesthetic Checklist: , timeout performed,  Correct Patient, Correct Site, Correct Laterality,  Correct Procedure, Correct Position, site marked,  Risks and benefits discussed,  Surgical consent,  Pre-op evaluation,  At surgeon's request and post-op pain management  Laterality: Lower and Right  Prep: chloraprep       Needles:  Injection technique: Single-shot  Needle Type: Echogenic Needle     Needle Length: 9cm  Needle Gauge: 20   Needle insertion depth: 3 cm   Additional Needles:   Procedures:,,,, ultrasound used (permanent image in chart),,    Narrative:  Start time: 12/13/2022 9:52 AM End time: 12/13/2022 10:03 AM Injection made incrementally with aspirations every 5 mL.  Performed by: Personally  Anesthesiologist: Lyn Hollingshead, MD

## 2022-12-13 NOTE — Anesthesia Procedure Notes (Signed)
Spinal  Patient location during procedure: OR Start time: 12/13/2022 10:19 AM End time: 12/13/2022 10:22 AM Reason for block: surgical anesthesia Staffing Performed: anesthesiologist  Anesthesiologist: Lyn Hollingshead, MD Performed by: Lyn Hollingshead, MD Authorized by: Lyn Hollingshead, MD   Preanesthetic Checklist Completed: patient identified, IV checked, site marked, risks and benefits discussed, surgical consent, monitors and equipment checked, pre-op evaluation and timeout performed Spinal Block Patient position: sitting Prep: DuraPrep and site prepped and draped Patient monitoring: continuous pulse ox and blood pressure Approach: midline Location: L3-4 Injection technique: single-shot Needle Needle type: Pencan  Needle gauge: 24 G Needle length: 10 cm Needle insertion depth: 5 cm Assessment Sensory level: T8 Events: CSF return

## 2022-12-13 NOTE — Anesthesia Postprocedure Evaluation (Signed)
Anesthesia Post Note  Patient: Colleen Shannon  Procedure(s) Performed: TOTAL KNEE REVISION (Right: Knee)     Patient location during evaluation: PACU Anesthesia Type: Spinal Level of consciousness: awake Pain management: pain level controlled Vital Signs Assessment: post-procedure vital signs reviewed and stable Respiratory status: spontaneous breathing Cardiovascular status: stable : PONV. Anesthesia complication: PONV.  No notable events documented.  Last Vitals:  Vitals:   12/13/22 1300 12/13/22 1315  BP: (!) 97/54 (!) 97/56  Pulse: 66 64  Resp: 19 19  Temp:    SpO2: 97% 95%    Last Pain:  Vitals:   12/13/22 1258  TempSrc:   PainSc: [name]

## 2022-12-13 NOTE — Discharge Instructions (Addendum)
Colleen Arabian, MD Total Joint Specialist [facility] [place] [address], [address] [place], [place] [address] [phone]  TOTAL KNEE REVISION POSTOPERATIVE DIRECTIONS  Knee Rehabilitation, Guidelines Following Surgery  Results after knee surgery are often greatly improved when you follow the exercise, range of motion and muscle strengthening exercises prescribed by your doctor. Safety measures are also important to protect the knee from further injury. If any of these exercises cause you to have increased pain or swelling in your knee joint, decrease the amount until you are comfortable again and slowly increase them. If you have problems or questions, call your caregiver or physical therapist for advice.   BLOOD CLOT PREVENTION Take a 10 mg Xarelto once a day for three weeks following surgery. Then take an 81 mg Aspirin once a day for three weeks. Then discontinue Aspirin. You may resume your vitamins/supplements once you have discontinued the Xarelto. Do not take any NSAIDs (Advil, Aleve, Ibuprofen, Meloxicam, etc.) until you have discontinued the Xarelto.   HOME CARE INSTRUCTIONS  Remove items at home which could result in a fall. This includes throw rugs or furniture in walking pathways.  ICE to the affected knee as much as tolerated. Icing helps control swelling. If the swelling is well controlled you will be more comfortable and rehab easier. Continue to use ice on the knee for pain and swelling from surgery. You may notice swelling that will progress down to the foot and ankle. This is normal after surgery. Elevate the leg when you are not up walking on it.    Continue to use the breathing machine which will help keep your temperature down. It is common for your temperature to cycle up and down following surgery, especially at night when you are not up moving around and exerting yourself. The breathing machine keeps your lungs expanded and your temperature down. Do not  place pillow under the operative knee, focus on keeping the knee straight while resting  DIET You may resume your previous home diet once you are discharged from the hospital.  DRESSING / WOUND CARE / SHOWERING Keep your bulky bandage on for 2 days. On the third post-operative day you may remove the Ace bandage and gauze. There is a waterproof adhesive bandage on your skin which will stay in place until your first follow-up appointment. Once you remove this you will not need to place another bandage You may begin showering 3 days following surgery, but do not submerge the incision under water.  ACTIVITY For the first 5 days, the key is rest and control of pain and swelling Do your home exercises twice a day starting on post-operative day 3. On the days you go to physical therapy, just do the home exercises once that day. You should rest, ice and elevate the leg for 50 minutes out of every hour. Get up and walk/stretch for 10 minutes per hour. After 5 days you can increase your activity slowly as tolerated. Walk with your walker as instructed. Use the walker until you are comfortable transitioning to a cane. Walk with the cane in the opposite hand of the operative leg. You may discontinue the cane once you are comfortable and walking steadily. Avoid periods of inactivity such as sitting longer than an hour when not asleep. This helps prevent blood clots.  You may discontinue the knee immobilizer once you are able to perform a straight leg raise while lying down. You may resume a sexual relationship in one month or when given  the OK by your doctor.  You may return to work once you are cleared by your doctor.  Do not drive a car for 6 weeks or until released by your surgeon.  Do not drive while taking narcotics.  TED HOSE STOCKINGS Wear the elastic stockings on both legs for three weeks following surgery during the day. You may remove them at night for sleeping.  WEIGHT BEARING Weight bearing  as tolerated with assist device (walker, cane, etc) as directed, use it as long as suggested by your surgeon or therapist, typically at least 4-6 weeks.  POSTOPERATIVE CONSTIPATION PROTOCOL Constipation - defined medically as fewer than three stools per week and severe constipation as less than one stool per week.  One of the most common issues patients have following surgery is constipation.  Even if you have a regular bowel pattern at home, your normal regimen is likely to be disrupted due to multiple reasons following surgery.  Combination of anesthesia, postoperative narcotics, change in appetite and fluid intake all can affect your bowels.  In order to avoid complications following surgery, here are some recommendations in order to help you during your recovery period.  Colace (docusate) - Pick up an over-the-counter form of Colace or another stool softener and take twice a day as long as you are requiring postoperative pain medications.  Take with a full glass of water daily.  If you experience loose stools or diarrhea, hold the colace until you stool forms back up. If your symptoms do not get better within 1 week or if they get worse, check with your doctor. Dulcolax (bisacodyl) - Pick up over-the-counter and take as directed by the product packaging as needed to assist with the movement of your bowels.  Take with a full glass of water.  Use this product as needed if not relieved by Colace only.  MiraLax (polyethylene glycol) - Pick up over-the-counter to have on hand. MiraLax is a solution that will increase the amount of water in your bowels to assist with bowel movements.  Take as directed and can mix with a glass of water, juice, soda, coffee, or tea. Take if you go more than two days without a movement. Do not use MiraLax more than once per day. Call your doctor if you are still constipated or irregular after using this medication for 7 days in a row.  If you continue to have problems with  postoperative constipation, please contact the office for further assistance and recommendations.  If you experience "the worst abdominal pain ever" or develop nausea or vomiting, please contact the office immediatly for further recommendations for treatment.  ITCHING If you experience itching with your medications, try taking only a single pain pill, or even half a pain pill at a time.  You can also use Benadryl over the counter for itching or also to help with sleep.   MEDICATIONS See your medication summary on the "After Visit Summary" that the nursing staff will review with you prior to discharge.  You may have some home medications which will be placed on hold until you complete the course of blood thinner medication.  It is important for you to complete the blood thinner medication as prescribed by your surgeon.  Continue your approved medications as instructed at time of discharge.  PRECAUTIONS If you experience chest pain or shortness of breath - call 911 immediately for transfer to the hospital emergency department.  If you develop a fever greater that 101 F, purulent drainage from  wound, increased redness or drainage from wound, foul odor from the wound/dressing, or calf pain - CONTACT YOUR SURGEON.                                                   FOLLOW-UP APPOINTMENTS Make sure you keep all of your appointments after your operation with your surgeon and caregivers. You should call the office at the above phone number and make an appointment for approximately two weeks after the date of your surgery or on the date instructed by your surgeon outlined in the "After Visit Summary".  RANGE OF MOTION AND STRENGTHENING EXERCISES  Rehabilitation of the knee is important following a knee injury or an operation. After just a few days of immobilization, the muscles of the thigh which control the knee become weakened and shrink (atrophy). Knee exercises are designed to build up the tone and strength  of the thigh muscles and to improve knee motion. Often times heat used for twenty to thirty minutes before working out will loosen up your tissues and help with improving the range of motion but do not use heat for the first two weeks following surgery. These exercises can be done on a training (exercise) mat, on the floor, on a table or on a bed. Use what ever works the best and is most comfortable for you Knee exercises include:  Leg Lifts - While your knee is still immobilized in a splint or cast, you can do straight leg raises. Lift the leg to 60 degrees, hold for 3 sec, and slowly lower the leg. Repeat 10-20 times 2-3 times daily. Perform this exercise against resistance later as your knee gets better.  Quad and Hamstring Sets - Tighten up the muscle on the front of the thigh (Quad) and hold for 5-10 sec. Repeat this 10-20 times hourly. Hamstring sets are done by pushing the foot backward against an object and holding for 5-10 sec. Repeat as with quad sets.  Leg Slides: Lying on your back, slowly slide your foot toward your buttocks, bending your knee up off the floor (only go as far as is comfortable). Then slowly slide your foot back down until your leg is flat on the floor again. Angel Wings: Lying on your back spread your legs to the side as far apart as you can without causing discomfort.  A rehabilitation program following serious knee injuries can speed recovery and prevent re-injury in the future due to weakened muscles. Contact your doctor or a physical therapist for more information on knee rehabilitation.   POST-OPERATIVE OPIOID TAPER INSTRUCTIONS: It is important to wean off of your opioid medication as soon as possible. If you do not need pain medication after your surgery it is ok to stop day one. Opioids include: Codeine, Hydrocodone(Norco, Vicodin), Oxycodone(Percocet, oxycontin) and hydromorphone amongst others.  Long term and even short term use of opiods can cause: Increased pain  response Dependence Constipation Depression Respiratory depression And more.  Withdrawal symptoms can include Flu like symptoms Nausea, vomiting And more Techniques to manage these symptoms Hydrate well Eat regular healthy meals Stay active Use relaxation techniques(deep breathing, meditating, yoga) Do Not substitute Alcohol to help with tapering If you have been on opioids for less than two weeks and do not have pain than it is ok to stop all together.  Plan to wean off  of opioids This plan should start within one week post op of your joint replacement. Maintain the same interval or time between taking each dose and first decrease the dose.  Cut the total daily intake of opioids by one tablet each day Next start to increase the time between doses. The last dose that should be eliminated is the evening dose.   IF YOU ARE TRANSFERRED TO A SKILLED REHAB FACILITY If the patient is transferred to a skilled rehab facility following release from the hospital, a list of the current medications will be sent to the facility for the patient to continue.  When discharged from the skilled rehab facility, please have the facility set up the patient's [place] prior to being released. Also, the skilled facility will be responsible for providing the patient with their medications at time of release from the facility to include their pain medication, the muscle relaxants, and their blood thinner medication. If the patient is still at the rehab facility at time of the two week follow up appointment, the skilled rehab facility will also need to assist the patient in arranging follow up appointment in our office and any transportation needs.  MAKE SURE YOU:  Understand these instructions.  Get help right away if you are not doing well or get worse.   DENTAL ANTIBIOTICS:  In most cases prophylactic antibiotics for Dental procdeures after total joint surgery are not  necessary.  Exceptions are as follows:  1. History of prior total joint infection  2. Severely immunocompromised (Organ Transplant, cancer chemotherapy, Rheumatoid biologic medications such as [place])  3. Poorly controlled diabetes (A1C &gt; 8.0, blood glucose over 200)  If you have one of these conditions, contact your surgeon for an antibiotic prescription, prior to your dental procedure.    Pick up stool softner and laxative for home use following surgery while on pain medications. Do not submerge incision under water. Please use good hand washing techniques while changing dressing each day. May shower starting three days after surgery. Please use a clean towel to pat the incision dry following showers. Continue to use ice for pain and swelling after surgery. Do not use any lotions or creams on the incision until instructed by your surgeon.   Information on my medicine - XARELTO (Rivaroxaban)  Why was Xarelto prescribed for you? Xarelto was prescribed for you to reduce the risk of blood clots forming after orthopedic surgery. The medical term for these abnormal blood clots is venous thromboembolism (VTE).  What do you need to know about xarelto ? Take your Xarelto ONCE DAILY at the same time every day. You may take it either with or without food.  If you have difficulty swallowing the tablet whole, you may crush it and mix in applesauce just prior to taking your dose.  Take Xarelto exactly as prescribed by your doctor and DO NOT stop taking Xarelto without talking to the doctor who prescribed the medication.  Stopping without other VTE prevention medication to take the place of Xarelto may increase your risk of developing a clot.  After discharge, you should have regular check-up appointments with your healthcare provider that is prescribing your Xarelto.    What do you do if you miss a dose? If you miss a dose, take it as soon as you remember on the same day then  continue your regularly scheduled once daily regimen the next day. Do not take two doses of Xarelto on the same day.   Important Safety  Information A possible side effect of Xarelto is bleeding. You should call your healthcare provider right away if you experience any of the following: Bleeding from an injury or your nose that does not stop. Unusual colored urine (red or dark brown) or unusual colored stools (red or black). Unusual bruising for unknown reasons. A serious fall or if you hit your head (even if there is no bleeding).  Some medicines may interact with Xarelto and might increase your risk of bleeding while on Xarelto. To help avoid this, consult your healthcare provider or pharmacist prior to using any new prescription or non-prescription medications, including herbals, vitamins, non-steroidal anti-inflammatory drugs (NSAIDs) and supplements.  This website has more information on Xarelto: [email or web address]

## 2022-12-13 NOTE — Brief Op Note (Signed)
12/13/2022  12:01 PM  PATIENT:  Colleen [place]  67 y.o. female  PRE-OPERATIVE DIAGNOSIS:  Right total knee arthroplasty loosening  POST-OPERATIVE DIAGNOSIS:  Right total knee arthroplasty loosening  PROCEDURE:  Procedure(s): TOTAL KNEE REVISION (Right)  SURGEON:  Surgeon(s) and Role:    [name], MD - Primary  PHYSICIAN ASSISTANT:   ASSISTANTS: AnnieSwanburg, PA-C   ANESTHESIA:   regional and spinal  EBL:  50 mL   BLOOD ADMINISTERED:none  DRAINS: none   LOCAL MEDICATIONS USED:  OTHER Exparel  COUNTS:  YES  TOURNIQUET:  75 minutes '@300'$  mmHg  DICTATION: .Other Dictation: Dictation Number [id number]  PLAN OF CARE: Admit to inpatient   PATIENT DISPOSITION:  PACU - hemodynamically stable.

## 2022-12-13 NOTE — Interval H&P Note (Signed)
History and Physical Interval Note:  12/13/2022 8:19 AM  Colleen Shannon  has presented today for surgery, with the diagnosis of Right total knee arthroplasty loosening.  The various methods of treatment have been discussed with the patient and family. After consideration of risks, benefits and other options for treatment, the patient has consented to  Procedure(s): TOTAL KNEE REVISION (Right) as a surgical intervention.  The patient's history has been reviewed, patient examined, no change in status, stable for surgery.  I have reviewed the patient's chart and labs.  Questions were answered to the patient's satisfaction.     Pilar Plate Willet Schleifer

## 2022-12-13 NOTE — Evaluation (Signed)
Physical Therapy Evaluation Patient Details Name: [place] MRN: [id number] DOB: 1956-05-08 Today's Date: 12/13/2022  History of Present Illness  66 yo female presents to therapy s/p R TKA revision on 12/13/2022 due to failed R TKA (2015). Pt has pmh including but not limited to: L BaCa, CCL, HTN, and HDL.  Clinical Impression  Colleen Shannon is a 67 y.o. female POD 0 s/p R TKA revision. Patient reports IND with mobility at baseline. Patient is now limited by functional impairments (see PT problem list below) and requires min guard for bed mobility and min guard for transfers. Patient was able to ambulate 48 feet with RW and min guard level of assist. Patient instructed in exercise to facilitate ROM and circulation to manage edema. . Patient will benefit from continued skilled PT interventions to address impairments and progress towards PLOF. Acute PT will follow to progress mobility and stair training in preparation for safe discharge home.      Recommendations for follow up therapy are one component of a multi-disciplinary discharge planning process, led by the attending physician.  Recommendations may be updated based on patient status, additional functional criteria and insurance authorization.  Follow Up Recommendations Outpatient PT      Assistance Recommended at Discharge Frequent or constant Supervision/Assistance  Patient can return home with the following  A little help with walking and/or transfers;A little help with bathing/dressing/bathroom;Assistance with cooking/housework;Assist for transportation;Help with stairs or ramp for entrance    Equipment Recommendations None recommended by PT (pt reports having DME at home)  Recommendations for Other Services       Functional Status Assessment Patient has had a recent decline in their functional status and demonstrates the ability to make significant improvements in function in a reasonable and predictable amount of time.      Precautions / Restrictions Precautions Precautions: Knee;Fall Restrictions Weight Bearing Restrictions: No      Mobility  Bed Mobility Overal bed mobility: Needs Assistance Bed Mobility: Supine to Sit     Supine to sit: Min guard     General bed mobility comments: increased time and HOB elevated    Transfers Overall transfer level: Needs assistance Equipment used: Rolling walker (2 wheels) Transfers: Sit to/from Stand Sit to Stand: Min guard           General transfer comment: cues for proper UE and LE placement    Ambulation/Gait Ambulation/Gait assistance: Min guard Gait Distance (Feet): 48 Feet Assistive device: Rolling walker (2 wheels) Gait Pattern/deviations: Step-to pattern, Trunk flexed Gait velocity: decreased        Stairs            Wheelchair Mobility    Modified Rankin (Stroke Patients Only)       Balance Overall balance assessment: Needs assistance Sitting-balance support: Feet supported Sitting balance-Leahy Scale: Fair     Standing balance support: Reliant on assistive device for balance Standing balance-Leahy Scale: Poor                               Pertinent Vitals/Pain Pain Assessment Pain Assessment: 0-10 Pain Score: 5  Pain Location: R knee Pain Descriptors / Indicators: Aching, Constant, Operative site guarding Pain Intervention(s): Limited activity within patient's tolerance, Monitored during session, Premedicated before session, Ice applied    Home Living Family/patient expects to be discharged to:: Private residence Living Arrangements: Spouse/significant other Available Help at Discharge: Family Type of Home: House Home Access: Stairs  to enter Entrance Stairs-Rails: Can reach both Entrance Stairs-Number of Steps: 4   Home Layout: One level Home Equipment: [place] - single Barista (2 wheels);Shower seat;Other (comment) (ice machine)      Prior Function Prior Level of Function :  Independent/Modified Independent;Driving             Mobility Comments: IND with all ADLs, self care tasks, IADLs and driving       Hand Dominance        Extremity/Trunk Assessment        Lower Extremity Assessment Lower Extremity Assessment: RLE deficits/detail RLE Deficits / Details: ankle DF/PF 5/5, pt required AA for SLR and able to breifly maintain elevated without assist RLE Sensation: WNL       Communication   Communication: No difficulties  Cognition Arousal/Alertness: Awake/alert Behavior During Therapy: WFL for tasks assessed/performed Overall Cognitive Status: Within Functional Limits for tasks assessed                                          General Comments General comments (skin integrity, edema, etc.): pt reports she typically has post op N and V with initial standing, no N or V at time of eval    Exercises Total Joint Exercises Ankle Circles/Pumps: AROM, Both, 20 reps Quad Sets: AROM, Right, 5 reps Straight Leg Raises: AAROM, Right, 5 reps   Assessment/Plan    PT Assessment Patient needs continued PT services  PT Problem List Decreased strength;Decreased range of motion;Decreased activity tolerance;Decreased balance;Decreased mobility;Decreased coordination;Pain       PT Treatment Interventions DME instruction;Stair training;Gait training;Functional mobility training;Therapeutic activities;Therapeutic exercise;Balance training;Neuromuscular re-education;Patient/family education;Modalities    PT Goals (Current goals can be found in the Care Plan section)  Acute Rehab PT Goals Patient Stated Goal: be able to bend my knee PT Goal Formulation: With patient Time For Goal Achievement: 12/27/22 Potential to Achieve Goals: Good    Frequency 7X/week     Co-evaluation               AM-PAC PT "6 Clicks" Mobility  Outcome Measure Help needed turning from your back to your side while in a flat bed without using bedrails?:  A Little Help needed moving from lying on your back to sitting on the side of a flat bed without using bedrails?: A Little Help needed moving to and from a bed to a chair (including a wheelchair)?: A Little Help needed standing up from a chair using your arms (e.g., wheelchair or bedside chair)?: A Little Help needed to walk in hospital room?: A Little Help needed climbing 3-5 steps with a railing? : Total 6 Click Score: 16    End of Session Equipment Utilized During Treatment: Gait belt Activity Tolerance: Patient tolerated treatment well;No increased pain Patient left: in chair;with call bell/phone within reach;with chair alarm set;with family/visitor present Nurse Communication: Mobility status PT Visit Diagnosis: Unsteadiness on feet (R26.81);Other abnormalities of gait and mobility (R26.89);Muscle weakness (generalized) (M62.81);Difficulty in walking, not elsewhere classified (R26.2);Pain Pain - Right/Left: Right Pain - part of body: Knee    Time: [id number] PT Time Calculation (min) (ACUTE ONLY): 34 min   Charges:   PT Evaluation $PT Eval Low Complexity: 1 Low PT Treatments $Therapeutic Activity: 8-22 mins       Baird Lyons, PT   Adair Patter 12/13/2022, 5:44 PM

## 2022-12-13 NOTE — Plan of Care (Signed)
  Problem: Activity: Goal: Ability to avoid complications of mobility impairment will improve Outcome: Progressing Goal: Range of joint motion will improve Outcome: Progressing   Problem: Pain Management: Goal: Pain level will decrease with appropriate interventions Outcome: Progressing   Problem: Safety: Goal: Ability to remain free from injury will improve Outcome: Progressing

## 2022-12-13 NOTE — Anesthesia Procedure Notes (Signed)
Procedure Name: MAC Date/Time: 12/13/2022 10:16 AM  Performed by: Victoriano Lain, CRNAPre-anesthesia Checklist: Patient identified, Emergency Drugs available, Suction available, Patient being monitored and Timeout performed Patient Re-evaluated:Patient Re-evaluated prior to induction Oxygen Delivery Method: Simple face mask Placement Confirmation: positive ETCO2 and breath sounds checked- equal and bilateral Dental Injury: Teeth and Oropharynx as per pre-operative assessment

## 2022-12-13 NOTE — Transfer of Care (Signed)
Immediate Anesthesia Transfer of Care Note  Patient: Colleen Shannon  Procedure(s) Performed: TOTAL KNEE REVISION (Right: Knee)  Patient Location: PACU  Anesthesia Type:MAC and Spinal  Level of Consciousness: awake, alert , oriented, and patient cooperative  Airway & Oxygen Therapy: Patient Spontanous Breathing and Patient connected to face mask oxygen  Post-op Assessment: Report given to RN and Post -op Vital signs reviewed and stable  Post vital signs: Reviewed and stable  Last Vitals:  Vitals Value Taken Time  BP 108/58 12/13/22 1231  Temp    Pulse 70 12/13/22 1234  Resp 11 12/13/22 1234  SpO2 100 % 12/13/22 1234  Vitals shown include unvalidated device data.  Last Pain:  Vitals:   12/13/22 1005  TempSrc:   PainSc: 0-No pain      Patients Stated Pain Goal: 4 ([id number])  Complications: No notable events documented.

## 2022-12-13 NOTE — Op Note (Unsigned)
NAMENAJAE, LEAVITT MEDICAL RECORD NO: [id number] ACCOUNT NO: [id number] DATE OF BIRTH: 21-Dec-1955 FACILITY: [facility] LOCATION: [facility] PHYSICIAN: Dione Plover. 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  PROCEDURE:  Right total knee arthroplasty revision.  SURGEON:  Dione Plover. Husam Hohn, MD  ASSISTANT:  Drenda Freeze, PA-C.  ANESTHESIA:  Adductor canal block and spinal.  ESTIMATED BLOOD LOSS:  50.  DRAINS:  None.  TOURNIQUET TIME:  75 minutes at 300 mmHg.  COMPLICATIONS:  None.  CONDITION:  Stable to recovery.  BRIEF CLINICAL NOTE:  The patient is a 67 year old female long history regards to her right knee.  She had a total knee arthroplasty performed developed significant arthrofibrosis had a closed manipulation and eventually had an open arthrotomy and scar  excision.  She has had worsening pain and persistent stiffness.  We got a bone scan, which showed probable loosening of tibial component.  She presents now for total knee arthroplasty revision.  Infection workup was negative.  DESCRIPTION OF PROCEDURE:  After successful administration of adductor canal block and spinal, tourniquet was placed on her right thigh.  Right lower extremity prepped and draped in the usual sterile fashion.  She only had about 75-80 degrees of flexion.   I gently manipulated the knee and was able to get her flexed about 110, which made the procedure easier.  Right lower extremity was wrapped in Esmarch, knee flexed and the tourniquet inflated to 300 mmHg.  A midline incision was made with a 10 blade  through subcutaneous tissue to the level of the extensor mechanism.  Fresh blade was used to make a medial parapatellar arthrotomy.  We did not encounter any fluid in the joint.  Soft  tissue over the proximal medial tibia subperiosteally elevated the  joint line with a knife and into the semimembranosus bursa with a Cobb elevator.  Soft tissue laterally was elevated with attention being paid to avoid the patellar tendon on tibial tubercle.  I removed a large amount of scar from under the extensor  mechanism first medially, then laterally with attention being paid to avoid the patellar tendon on tibial tubercle.  After removing the scar I was actually able to evert the patella.  The knee was flexed 90 degrees and the tibia subluxed forward.  The  tibial polyethylene was removed.  We then obtained circumferential retraction around the tibia and subluxed it forward.  There was a lot of scar around the component itself and it was actually bone overlying the lateral tibial plate and removed that bone  and then exposed the edges of the tibial component.  We disrupted the interface between the component and bone with an oscillating saw.  It easily came out just with minimal disruption to the interface consistent with a loose tibial component.  The  cement in the proximal tibia was then removed without difficulty.  The extramedullary tibial alignment guide was placed, referencing proximally at the medial aspect of tibial tubercle and distally along the second metatarsal axis and tibial crest.  The  blocks pinned to remove about 2 mm off the previous resection level.  Resection was made with an oscillating saw.  Size 2 was the most appropriate size for the tibial component.  We prepared the proximal tibia with the modular drill and then prepared the  tibial canal for  a stem, which was a 14 x 50 stem.  The sleeve was then prepared proximally to a 29 sleeve, which had excellent torsional stability.  We then addressed the femoral side.  Osteotome was used to disrupt the interface between the femoral component and bone, which was also removed with no bone loss.  We accessed the femoral canal and I  reamed up to a 16 mm for a 16 x 60 stem.  The distal  femoral cutting block was then placed on the reamer, which served as our intramedullary guide.  We made our distal femoral cut did not need any distal femoral augments.  Size 3 is the most appropriate size for the femur.  Given that we took off an Attune  femur I was able to put the trial revision Attune femur onto the distal femur and we just needed to deepen the box cut all the other cuts were fine.  I did want to get a little more stability in flexion thus I did place 4 mm augments on the trial  posteriorly medial and lateral.  We then built the trials on the tibial side was a size 2 Attune revision tibial tray with a 29 sleeve and a 14 x 50 stem extension.  On the femoral side, it was a size 3 Attune revision femur with 4 mm augments  posteriorly medial and lateral and a 16 x 60 stem extension.  The trials were placed and with a 20 mm insert, full extension was achieved with excellent varus, valgus, anterior, posterior balance throughout full range of motion.  The patella was well  fixed and was not worn.  I did a patellaplasty by removing all the soft tissue over the patella, which allowed for normal tracking of the patellar component.  The trials were then removed and the cement is mixed, which is two batches of gentamicin  impregnated cement.  On the tibial side, we press-fit the stem had a porous coated sleeve and just cemented at the interface between the tibial baseplate and bone.  We impacted that and removed any extruded cement.  On the femoral side, we just cemented  distally with a press-fit stem.  The cement was placed in the femoral component impacted and all extruded cement removed.  With the 20 mm trial insert full extension was achieved with excellent varus, valgus, and AP balance throughout full range of  motion.  Once cement was fully hardened, then the permanent 20 mm posterior stabilized rotating platform Attune revision insert was  placed.  A 20 mL of Exparel mixed with 60 mL of saline injected into the extensor mechanism, the posterior capsule and  subcutaneous tissues.  Wound was copiously irrigated with saline solution and the arthrotomy closed with a running 0 Stratafix suture.  Flexion against gravity was about 125 degrees.  Patella tracked normally.  Tourniquet was released total time about 75  minutes.  The subcutaneous tissue was closed with interrupted 2-0 Vicryl and then subcuticular closure running 4-0 Monocryl.  Incisions cleaned and dried and Steri-Strips and sterile dressing applied.  She has subsequently placed into a knee  immobilizer, awakened, and transported to recovery in stable condition.  Please note that a surgical assistant was of medical necessity for this procedure.  Assistance necessary for retraction of vital ligaments and neurovascular structures and for proper positioning of the limb, for removal of the old implant and for safe  and accurate placement of the new implant.   PUS D: 12/13/2022 12:11:31 pm T:  12/13/2022 1:49:00 pm  JOB: [id number]

## 2022-12-13 NOTE — Progress Notes (Signed)
Orthopedic Tech Progress Note Patient Details:  Colleen Shannon 1955-11-09 [id number]  Patient ID: Colleen Shannon, female   DOB: 19-Jan-1956, 67 y.o.   MRN: [id number]  [name] 12/13/2022, 12:59 PM Cpm applied to right leg in pacu

## 2022-12-14 ENCOUNTER — Encounter (HOSPITAL_COMMUNITY): Payer: Self-pay | Admitting: Orthopedic Surgery

## 2022-12-14 LAB — BASIC METABOLIC PANEL
Anion gap: 8 (ref 5–15)
BUN: 10 mg/dL (ref 8–23)
CO2: 25 mmol/L (ref 22–32)
Calcium: 8.9 mg/dL (ref 8.9–10.3)
Chloride: 108 mmol/L (ref 98–111)
Creatinine, Ser: 0.58 mg/dL (ref 0.44–1.00)
GFR, Estimated: >60 mL/min (ref >60)
Glucose, Bld: 158 mg/dL — ABNORMAL HIGH (ref 70–99)
Potassium: 4 mmol/L (ref 3.5–5.1)
Sodium: 141 mmol/L (ref 135–145)

## 2022-12-14 LAB — CBC
HCT: 33.9 % — ABNORMAL LOW (ref 36.0–46.0)
Hemoglobin: 11.2 g/dL — ABNORMAL LOW (ref 12.0–15.0)
MCH: 30.4 pg (ref 26.0–34.0)
MCHC: 33 g/dL (ref 30.0–36.0)
MCV: 92.1 fL (ref 80.0–100.0)
Platelets: 203 10*3/uL (ref 150–400)
RBC: 3.68 MIL/uL — ABNORMAL LOW (ref 3.87–5.11)
RDW: 13.8 % (ref 11.5–15.5)
WBC: 10.7 10*3/uL — ABNORMAL HIGH (ref 4.0–10.5)
nRBC: 0 % (ref 0.0–0.2)

## 2022-12-14 MED ORDER — OXYCODONE HCL 5 MG PO TABS: 5.0000 mg | ORAL_TABLET | Freq: Four times a day (QID) | ORAL | 0 refills | Status: DC | PRN

## 2022-12-14 MED ORDER — RIVAROXABAN 10 MG PO TABS: 10.0000 mg | ORAL_TABLET | Freq: Every day | ORAL | 0 refills | Status: DC

## 2022-12-14 MED ORDER — TRAMADOL HCL 50 MG PO TABS: 50.0000 mg | ORAL_TABLET | Freq: Four times a day (QID) | ORAL | 0 refills | Status: DC | PRN

## 2022-12-14 MED ORDER — METHOCARBAMOL 500 MG PO TABS: 500.0000 mg | ORAL_TABLET | Freq: Four times a day (QID) | ORAL | 0 refills | Status: DC | PRN

## 2022-12-14 MED ORDER — GABAPENTIN 300 MG PO CAPS: ORAL_CAPSULE | ORAL | 0 refills | Status: DC

## 2022-12-14 NOTE — Progress Notes (Signed)
Subjective: 1 Day Post-Op Procedure(s) (LRB): TOTAL KNEE REVISION (Right) Patient reports pain as mild.   Patient seen in rounds by Dr. Wynelle Link. Patient is well, and has had no acute complaints or problems No issues overnight. Denies chest pain, SOB, or calf pain. Foley catheter removed this AM.  We will continue therapy today, ambulated 80' yesterday.   Objective: Vital signs in last 24 hours: Temp:  [97.7 F (36.5 C)-98.9 F (37.2 C)] 98.5 F (36.9 C) (03/07 0530) Pulse Rate:  [61-77] 72 (03/07 0530) Resp:  [11-19] 16 (03/07 0530) BP: (91-167)/(47-76) 119/64 (03/07 0530) SpO2:  [94 %-100 %] 97 % (03/07 0530) Weight:  [68 kg] 68 kg (03/06 1424)  Intake/Output from previous day:  Intake/Output Summary (Last 24 hours) at 12/14/2022 0726 Last data filed at 12/14/2022 0600 Gross per 24 hour  Intake 3016.69 ml  Output 4450 ml  Net -1433.31 ml     Intake/Output this shift: No intake/output data recorded.  Labs: Recent Labs    12/14/22 0327  HGB 11.2*   Recent Labs    12/14/22 0327  WBC 10.7*  RBC 3.68*  HCT 33.9*  PLT 203   Recent Labs    12/14/22 0327  NA 141  K 4.0  CL 108  CO2 25  BUN 10  CREATININE 0.58  GLUCOSE 158*  CALCIUM 8.9   No results for input(s): "LABPT", "INR" in the last 72 hours.  Exam: General - Patient is Alert, Appropriate, and Oriented Extremity - Neurovascular intact Dorsiflexion/Plantar flexion intact Calves soft and nontender Dressing - dressing C/D/I Motor Function - intact, moving foot and toes well on exam.   Past Medical History:  Diagnosis Date   Arthritis    Breast CA ([place]) dx'd 2015   left   Bronchiectasis ([place])    Cancer ([place])    hx of skin cancer    CLL (chronic lymphocytic leukemia) ([place]) dx'd 2015   Dyspnea    Dysrhythmia    PVCs   Elevated cholesterol 05/22/2012   Nuc stress test-Normal.   Endometrial polyp    GERD (gastroesophageal reflux disease)    Grade I diastolic dysfunction    noted on echo    History of palpitations    Hypertension 05/22/12   ECHO-EF >55% trace tricupsid regurgitation. There is mild mitral regurgitation.    MR (mitral regurgitation)    mild noted on echo   Plantar fasciitis of left foot    resolved   Pneumonia    history of x 2   PONV (postoperative nausea and vomiting)     Assessment/Plan: 1 Day Post-Op Procedure(s) (LRB): TOTAL KNEE REVISION (Right) Principal Problem:   Failed total knee arthroplasty (HCC)  Estimated body mass index is 28.33 kg/m as calculated from the following:   Height as of this encounter: '5\' 1"'$  (1.549 m).   Weight as of this encounter: 68 kg. Up with therapy   Patient's anticipated LOS is less than 2 midnights, meeting these requirements: - Younger than 16 - Lives within 1 hour of care - Has a competent adult at home to recover with post-op recover - NO history of  - Chronic pain requiring opiods  - Diabetes  - Coronary Artery Disease  - Heart failure  - Heart attack  - Stroke  - DVT/VTE  - Cardiac arrhythmia  - Respiratory Failure/COPD  - Renal failure  - Anemia  - Advanced Liver disease     DVT Prophylaxis - Xarelto Weight bearing as tolerated. Continue therapy.  Plan is to go Home after hospital stay. Plan for discharge later today if progresses with therapy and meeting goals. Will order home CPM with patient's history of knee stiffness. Scheduled for OPP[facility] at [facility]. Follow-up in the office in 2 weeks.  The PDMP database was reviewed today prior to any opioid medications being prescribed to this patient.   Shearon Balo, PA-C Orthopedic Surgery 12/14/2022, 7:26 AM

## 2022-12-14 NOTE — Plan of Care (Signed)
Plan of care reviewed and discussed. 

## 2022-12-14 NOTE — TOC Transition Note (Signed)
Transition of Care [facility]) - CM/SW Discharge Note   Patient Details  Name: Colleen Shannon MRN: [id number] Date of Birth: [birth year]  Transition of [facility]) CM/SW Contact:  Lennart Pall, LCSW Phone Number: 12/14/2022, 10:49 AM   Clinical Narrative:     Met with pt and confirming she has needed DME at home.  OPPT already arranged with [facility].  No TOC needs.  Final next level of care: OP Rehab Barriers to Discharge: No Barriers Identified   Patient Goals and CMS Choice      Discharge Placement                         Discharge Plan and Services Additional resources added to the After Visit Summary for                  DME Arranged: N/A DME Agency: NA                  Social Determinants of Health (SDOH) Interventions SDOH Screenings   Food Insecurity: No Food Insecurity (12/13/2022)  Housing: Low Risk  (12/13/2022)  Transportation Needs: No Transportation Needs (12/13/2022)  Utilities: Not At Risk (12/13/2022)  Tobacco Use: Low Risk  (12/13/2022)     Readmission Risk Interventions    12/14/2022   10:48 AM  Readmission Risk Prevention Plan  Post Dischage Appt Complete  Medication Screening Complete  Transportation Screening Complete

## 2022-12-14 NOTE — Progress Notes (Signed)
Physical Therapy Treatment Patient Details Name: Colleen Shannon MRN: [id number] DOB: January 28, 1956 Today's Date: 12/14/2022   History of Present Illness 67 yo female presents to therapy s/p R TKA revision on 12/13/2022 due to failed R TKA (2015). Pt has pmh including but not limited to: L BaCa, CCL, HTN, and HDL.    PT Comments    POD # 1 pm session Spouse present during session.  Pt able to self transfer OOB.  Assisted with amb in hallway and practiced stairs went well.  General transfer comment: good safety cognition and use of hands to steady self.  General Gait Details: tolertaed a functional distance with alternating gait.  Had Pt amb with Spouse "hands on" using safety belt.  NO major issues.  General stair comments: 50% VC's on proper sequencing and tech with Spouse "hands on" assisted using safety belt. Then returned to room to perform some TE's following HEP handout.  Instructed on proper tech, freq as well as use of ICE.   Addressed all mobility questions, discussed appropriate activity, educated on use of ICE.  Pt ready for D/C to home.   Recommendations for follow up therapy are one component of a multi-disciplinary discharge planning process, led by the attending physician.  Recommendations may be updated based on patient status, additional functional criteria and insurance authorization.  Follow Up Recommendations  Outpatient PT     Assistance Recommended at Discharge Frequent or constant Supervision/Assistance  Patient can return home with the following A little help with walking and/or transfers;A little help with bathing/dressing/bathroom;Assistance with cooking/housework;Assist for transportation;Help with stairs or ramp for entrance   Equipment Recommendations  None recommended by PT    Recommendations for Other Services       Precautions / Restrictions Precautions Precautions: Knee;Fall Precaution Comments: instructed no pillow under knee Restrictions Weight Bearing  Restrictions: No Other Position/Activity Restrictions: WBAT     Mobility  Bed Mobility Overal bed mobility: Modified Independent             General bed mobility comments: self able with increased time    Transfers Overall transfer level: Modified independent Equipment used: Rolling walker (2 wheels) Transfers: Sit to/from Stand Sit to Stand: Supervision, Min guard           General transfer comment: good safety cognition and use of hands to steady self    Ambulation/Gait Ambulation/Gait assistance: Supervision Gait Distance (Feet): 65 Feet Assistive device: Rolling walker (2 wheels) Gait Pattern/deviations: Step-to pattern, Trunk flexed Gait velocity: decreased     General Gait Details: tolertaed a functional distance with alternating gait.  Had Pt amb with Spouse "hands on" using safety belt.  NO major issues.   Stairs Stairs: Yes Stairs assistance: Supervision, Min guard Stair Management: Two rails, Step to pattern, Forwards Number of Stairs: 4 General stair comments: 50% VC's on proper sequencing and tech with Spouse "hands on" assisted using safety belt.   Wheelchair Mobility    Modified Rankin (Stroke Patients Only)       Balance                                            Cognition Arousal/Alertness: Awake/alert Behavior During Therapy: WFL for tasks assessed/performed Overall Cognitive Status: Within Functional Limits for tasks assessed  General Comments: AxO x 3 pleasant/motivated.  Sitting EOB on arrival.  Pre medicated.        Exercises  Total Knee Replacement TE's following HEP handout  05 reps seated knee bends 05 reps seated assisted knee bends 05 reps heel slide  Followed by ICE     General Comments        Pertinent Vitals/Pain Pain Assessment Pain Assessment: Faces Pain Score: 3  Faces Pain Scale: Hurts a little bit Pain Location: R knee Pain  Descriptors / Indicators: Aching, Constant, Operative site guarding Pain Intervention(s): Monitored during session, Premedicated before session, Repositioned, Ice applied    Home Living                          Prior Function            PT Goals (current goals can now be found in the care plan section) Progress towards PT goals: Progressing toward goals    Frequency    7X/week      PT Plan Current plan remains appropriate    Co-evaluation              AM-PAC PT "6 Clicks" Mobility   Outcome Measure  Help needed turning from your back to your side while in a flat bed without using bedrails?: None Help needed moving from lying on your back to sitting on the side of a flat bed without using bedrails?: None Help needed moving to and from a bed to a chair (including a wheelchair)?: None Help needed standing up from a chair using your arms (e.g., wheelchair or bedside chair)?: None Help needed to walk in hospital room?: A Little Help needed climbing 3-5 steps with a railing? : A Little 6 Click Score: 22    End of Session Equipment Utilized During Treatment: Gait belt Activity Tolerance: Patient tolerated treatment well Patient left: in chair;with call bell/phone within reach;with chair alarm set Nurse Communication: Mobility status PT Visit Diagnosis: Unsteadiness on feet (R26.81);Other abnormalities of gait and mobility (R26.89);Muscle weakness (generalized) (M62.81);Difficulty in walking, not elsewhere classified (R26.2);Pain Pain - Right/Left: Right Pain - part of body: Knee     Time: 1445-1510 PT Time Calculation (min) (ACUTE ONLY): 25 min  Charges:  $Gait Training: 8-22 mins $Therapeutic Exercise: 8-22 mins                     Rica Koyanagi  PTA [facility] M-F          [phone] Weekend pager [phone]

## 2022-12-14 NOTE — Progress Notes (Signed)
Patient discharged to home w/ family. Given all belongings, instructions. Verbalized understanding of instructions. Escorted to pov via w/c. 

## 2022-12-14 NOTE — Progress Notes (Signed)
Orthopedic Tech Progress Note Patient Details:  Colleen Shannon 1955-10-30 [id number]  Patient ID: Colleen Shannon, female   DOB: October 10, 1955, 67 y.o.   MRN: [id number]  [name] 12/14/2022, 12:01 PM Cpm applied. Increase to 50 degrees

## 2022-12-14 NOTE — Progress Notes (Signed)
Physical Therapy Treatment Patient Details Name: Colleen Shannon MRN: [id number] DOB: 09-08-1956 Today's Date: 12/14/2022   History of Present Illness 67 yo female presents to therapy s/p R TKA revision on 12/13/2022 due to failed R TKA (2015). Pt has pmh including but not limited to: L BaCa, CCL, HTN, and HDL.    PT Comments    POD # 1 am session General Comments: AxO x 3 pleasant/motivated.  Sitting EOB on arrival.  Pre medicated. Assisted with amb in hallway, assisted to bathroom Then returned to room to perform some TE's following HEP handout.  Instructed on proper tech, freq as well as use of ICE.   Pt will need a second PT session to complete HEP Education and practice stairs.   Recommendations for follow up therapy are one component of a multi-disciplinary discharge planning process, led by the attending physician.  Recommendations may be updated based on patient status, additional functional criteria and insurance authorization.  Follow Up Recommendations  Outpatient PT     Assistance Recommended at Discharge    Patient can return home with the following A little help with walking and/or transfers;A little help with bathing/dressing/bathroom;Assistance with cooking/housework;Assist for transportation;Help with stairs or ramp for entrance   Equipment Recommendations  None recommended by PT    Recommendations for Other Services       Precautions / Restrictions Precautions Precautions: Knee;Fall Precaution Comments: instructed no pillow under knee Restrictions Weight Bearing Restrictions: No Other Position/Activity Restrictions: WBAT     Mobility  Bed Mobility Overal bed mobility: Modified Independent             General bed mobility comments: self able with increased time    Transfers Overall transfer level: Needs assistance Equipment used: Rolling walker (2 wheels) Transfers: Sit to/from Stand Sit to Stand: Supervision, Min guard           General  transfer comment: one VC on safety with turns and proper LE placement    Ambulation/Gait Ambulation/Gait assistance: Supervision, Min guard Gait Distance (Feet): 57 Feet Assistive device: Rolling walker (2 wheels) Gait Pattern/deviations: Step-to pattern, Trunk flexed Gait velocity: decreased     General Gait Details: increased time with < 25% VC's on proper walker to self distance and safety with turns   Marine scientist Rankin (Stroke Patients Only)       Balance                                            Cognition Arousal/Alertness: Awake/alert Behavior During Therapy: WFL for tasks assessed/performed Overall Cognitive Status: Within Functional Limits for tasks assessed                                 General Comments: AxO x 3 pleasant/motivated.  Sitting EOB on arrival.  Pre medicated.        Exercises  Total Knee Replacement TE's following HEP handout 10 reps B LE ankle pumps 05 reps towel squeezes 05 reps knee presses 05 reps heel slides   Educated on use of gait belt to assist with TE's Followed by ICE     General Comments        Pertinent Vitals/Pain Pain Assessment Pain Assessment: 0-10 Pain Score:  3  Pain Location: R knee Pain Descriptors / Indicators: Aching, Constant, Operative site guarding Pain Intervention(s): Monitored during session, Premedicated before session, Repositioned, Ice applied    Home Living                          Prior Function            PT Goals (current goals can now be found in the care plan section) Progress towards PT goals: Progressing toward goals    Frequency    7X/week      PT Plan      Co-evaluation              AM-PAC PT "6 Clicks" Mobility   Outcome Measure  Help needed turning from your back to your side while in a flat bed without using bedrails?: A Little Help needed moving from lying on your back  to sitting on the side of a flat bed without using bedrails?: A Little Help needed moving to and from a bed to a chair (including a wheelchair)?: A Little Help needed standing up from a chair using your arms (e.g., wheelchair or bedside chair)?: A Little Help needed to walk in hospital room?: A Little Help needed climbing 3-5 steps with a railing? : A Little 6 Click Score: 18    End of Session Equipment Utilized During Treatment: Gait belt Activity Tolerance: Patient tolerated treatment well Patient left: in chair;with call bell/phone within reach;with chair alarm set Nurse Communication: Mobility status PT Visit Diagnosis: Unsteadiness on feet (R26.81);Other abnormalities of gait and mobility (R26.89);Muscle weakness (generalized) (M62.81);Difficulty in walking, not elsewhere classified (R26.2);Pain Pain - Right/Left: Right Pain - part of body: Knee     Time: [id number] PT Time Calculation (min) (ACUTE ONLY): 29 min  Charges:  $Gait Training: 8-22 mins $Therapeutic Exercise: 8-22 mins                     {Hilja Kintzel  PTA Acute  [employer] M-F          [phone] Weekend pager [phone]

## 2022-12-15 DIAGNOSIS — M1711 Unilateral primary osteoarthritis, right knee: Secondary | ICD-10-CM | POA: Diagnosis not present

## 2022-12-15 DIAGNOSIS — Z96651 Presence of right artificial knee joint: Secondary | ICD-10-CM | POA: Diagnosis not present

## 2022-12-18 DIAGNOSIS — M25661 Stiffness of right knee, not elsewhere classified: Secondary | ICD-10-CM | POA: Diagnosis not present

## 2022-12-18 NOTE — Discharge Summary (Signed)
Physician Discharge Summary   Patient ID: Colleen Shannon MRN: [id number] DOB/AGE: November 15, 1955 67 y.o.  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   Endometrial polyp    GERD (gastroesophageal reflux disease)    Grade I diastolic dysfunction    noted on echo   History of palpitations    Hypertension 05/22/12   ECHO-EF >55% trace tricupsid regurgitation. There is mild mitral regurgitation.    MR (mitral regurgitation)    mild noted on echo   Plantar fasciitis of left foot    resolved   Pneumonia    history of x 2   PONV (postoperative nausea and vomiting)    Discharge Diagnoses:   Principal Problem:   Failed total knee arthroplasty ([place])  Estimated body mass index is 28.33 kg/m as calculated from the following:   Height as of this encounter: '5\' 1"'$  (1.549 m).   Weight as of this encounter: 68 kg.  Procedure:  Procedure(s) (LRB): TOTAL KNEE REVISION (Right)   Consults: None  HPI:  The patient is a 67 year old female long history regards to her right knee.  She had a total knee arthroplasty performed developed significant arthrofibrosis had a closed manipulation and eventually had an open arthrotomy and scar  excision.  She has had worsening pain and persistent stiffness.  We got a bone scan, which showed probable loosening of tibial component.  She presents now for total knee arthroplasty revision.  Infection workup was negative.  Laboratory Data: Admission on 12/13/2022, Discharged on 12/14/2022  Component Date Value Ref Range Status   WBC  12/14/2022 10.7 (H)  4.0 - 10.5 K/uL Final   RBC 12/14/2022 3.68 (L)  3.87 - 5.11 MIL/uL Final   Hemoglobin 12/14/2022 11.2 (L)  12.0 - 15.0 g/dL Final   HCT 12/14/2022 33.9 (L)  36.0 - 46.0 % Final   MCV 12/14/2022 92.1  80.0 - 100.0 fL Final   MCH 12/14/2022 30.4  26.0 - 34.0 pg Final   MCHC 12/14/2022 33.0  30.0 - 36.0 g/dL Final   RDW 12/14/2022 13.8  11.5 - 15.5 % Final   Platelets 12/14/2022 203  150 - 400 K/uL Final   nRBC 12/14/2022 0.0  0.0 - 0.2 % Final   Performed at [facility], [place] [address] [place], [place] [address]   Sodium 12/14/2022 141  135 - 145 mmol/L Final   Potassium 12/14/2022 4.0  3.5 - 5.1 mmol/L Final   Chloride 12/14/2022 108  98 - 111 mmol/L Final   CO2 12/14/2022 25  22 - 32 mmol/L Final   Glucose, Bld 12/14/2022 158 (H)  70 - 99 mg/dL Final   Glucose reference range applies only to samples taken after fasting for at least 8 hours.   BUN 12/14/2022 10  8 - 23 mg/dL Final   Creatinine, Ser 12/14/2022 0.58  0.44 - 1.00 mg/dL Final   Calcium 12/14/2022 8.9  8.9 - 10.3 mg/dL Final   GFR, Estimated 12/14/2022 >60  >60 mL/min Final  Comment: (NOTE) Calculated using the CKD-EPI Creatinine Equation (2021)    Anion gap 12/14/2022 8  5 - 15 Final   Performed at [facility], [place] [address] [place], [place] [address]  Hospital Outpatient Visit on 12/04/2022  Component Date Value Ref Range Status   MRSA, PCR 12/04/2022 NEGATIVE  NEGATIVE Final   Staphylococcus aureus 12/04/2022 NEGATIVE  NEGATIVE Final   Comment: (NOTE) The Xpert SA Assay (FDA approved for NASAL specimens in patients 28 years of age and older), is one component of a comprehensive surveillance program. It is not intended to diagnose infection nor to guide or monitor treatment. Performed at [facility], [place] [address] [place], [place] [address]    Hgb A1c MFr Bld 12/04/2022 5.1  4.8 - 5.6 % Final   Comment: (NOTE)          Prediabetes: 5.7 - 6.4         Diabetes: >6.4         Glycemic control for adults with diabetes: <7.0    Mean Plasma Glucose 12/04/2022 100  mg/dL Final   Comment: (NOTE) Performed At: [facility] [place], [place] [id number] Rush Farmer MD [id number]    Sodium 12/04/2022 140  135 - 145 mmol/L Final   Potassium 12/04/2022 4.2  3.5 - 5.1 mmol/L Final   Chloride 12/04/2022 104  98 - 111 mmol/L Final   CO2 12/04/2022 28  22 - 32 mmol/L Final   Glucose, Bld 12/04/2022 107 (H)  70 - 99 mg/dL Final   Glucose reference range applies only to samples taken after fasting for at least 8 hours.   BUN 12/04/2022 8  8 - 23 mg/dL Final   Creatinine, Ser 12/04/2022 0.41 (L)  0.44 - 1.00 mg/dL Final   Calcium 12/04/2022 9.3  8.9 - 10.3 mg/dL Final   GFR, Estimated 12/04/2022 >60  >60 mL/min Final   Comment: (NOTE) Calculated using the CKD-EPI Creatinine Equation (2021)    Anion gap 12/04/2022 8  5 - 15 Final   Performed at [facility], [place] [address], [place], [place] [address]   WBC 12/04/2022 4.7  4.0 - 10.5 K/uL Final   RBC 12/04/2022 4.44  3.87 - 5.11 MIL/uL Final   Hemoglobin 12/04/2022 13.2  12.0 - 15.0 g/dL Final   HCT 12/04/2022 40.8  36.0 - 46.0 % Final   MCV 12/04/2022 91.9  80.0 - 100.0 fL Final   MCH 12/04/2022 29.7  26.0 - 34.0 pg Final   MCHC 12/04/2022 32.4  30.0 - 36.0 g/dL Final   RDW 12/04/2022 13.8  11.5 - 15.5 % Final   Platelets 12/04/2022 248  150 - 400 K/uL Final   nRBC 12/04/2022 0.0  0.0 - 0.2 % Final   Performed at [facility], [place] [address], [place], [place] [address]   ABO/RH(D) 12/04/2022 A POS   Final   Antibody Screen 12/04/2022 NEG   Final   Sample Expiration 12/04/2022 12/16/2022,2359   Final   Extend sample reason 12/04/2022    Final                   Value:NO TRANSFUSIONS OR PREGNANCY IN THE PAST 3 MONTHS Performed at [facility], [place] [address], [place], [place]  [address]      X-Rays:No results found.  EKG: Orders placed or performed in visit on 11/08/22   EKG 12-Lead     Hospital Course: Roshanda Thornock  Muha is a 67 y.o. who was admitted to [facility]. They were brought to the operating room on 12/13/2022 and underwent Procedure(s): [place].  Patient tolerated the procedure well and was later transferred to the recovery room and then to the orthopaedic floor for postoperative care. They were given PO and IV analgesics for pain control following their surgery. They were given 24 hours of postoperative antibiotics of  Anti-infectives (From admission, onward)    Start     Dose/Rate Route Frequency Ordered Stop   12/13/22 1600  ceFAZolin (ANCEF) IVPB 2g/100 mL premix        2 g 200 mL/hr over 30 Minutes Intravenous Every 6 hours 12/13/22 1407 12/13/22 2336   12/13/22 0830  ceFAZolin (ANCEF) IVPB 2g/100 mL premix        2 g 200 mL/hr over 30 Minutes Intravenous On call to O.R. 12/13/22 [id number] 12/13/22 1054      and started on DVT prophylaxis in the form of Xarelto.   PT and OT were ordered for total joint protocol. Discharge planning consulted to help with postop disposition and equipment needs.  Patient had an uneventful night on the evening of surgery. They started to get up OOB with therapy on POD 0. Pt was seen during rounds and was ready to go home pending progress with therapy. She worked with therapy on POD #1 and was meeting her goals. Pt was discharged to home later that day in stable condition. Outpatient physical therapy was arranged.  Diet: Regular diet Activity: WBAT Follow-up: in 2 weeks Disposition: Home Discharged Condition: good   Discharge Instructions     Call MD / Call 911   Complete by: As directed    If you experience chest pain or shortness of breath, CALL 911 and be transported to the hospital emergency room.  If you develope a fever above 101 F, pus (white drainage) or increased drainage or redness at the  wound, or calf pain, call your surgeon's office.   Change dressing   Complete by: As directed    You may remove the bulky bandage (ACE wrap and gauze) two days after surgery. You will have an adhesive waterproof bandage underneath. Leave this in place until your first follow-up appointment.   Constipation Prevention   Complete by: As directed    Drink plenty of fluids.  Prune juice may be helpful.  You may use a stool softener, such as Colace (over the counter) 100 mg twice a day.  Use MiraLax (over the counter) for constipation as needed.   Diet - low sodium heart healthy   Complete by: As directed    Do not put a pillow under the knee. Place it under the heel.   Complete by: As directed    Driving restrictions   Complete by: As directed    No driving for two weeks   Post-operative opioid taper instructions:   Complete by: As directed    POST-OPERATIVE OPIOID TAPER INSTRUCTIONS: It is important to wean off of your opioid medication as soon as possible. If you do not need pain medication after your surgery it is ok to stop day one. Opioids include: Codeine, Hydrocodone(Norco, Vicodin), Oxycodone(Percocet, oxycontin) and hydromorphone amongst others.  Long term and even short term use of opiods can cause: Increased pain response Dependence Constipation Depression Respiratory depression And more.  Withdrawal symptoms can include Flu like symptoms Nausea, vomiting And more Techniques to manage these symptoms Hydrate well Eat regular healthy meals Stay  active Use relaxation techniques(deep breathing, meditating, yoga) Do Not substitute Alcohol to help with tapering If you have been on opioids for less than two weeks and do not have pain than it is ok to stop all together.  Plan to wean off of opioids This plan should start within one week post op of your joint replacement. Maintain the same interval or time between taking each dose and first decrease the dose.  Cut the total  daily intake of opioids by one tablet each day Next start to increase the time between doses. The last dose that should be eliminated is the evening dose.      TED hose   Complete by: As directed    Use stockings (TED hose) for three weeks on both leg(s).  You may remove them at night for sleeping.   Weight bearing as tolerated   Complete by: As directed       Allergies as of 12/14/2022   No Known Allergies      Medication List     STOP taking these medications    amoxicillin-clavulanate 875-125 MG tablet Commonly known as: AUGMENTIN   B COMPLEX 1 PO   B-12 2500 MCG Tabs   calcium carbonate 500 MG chewable tablet Commonly known as: TUMS - dosed in mg elemental calcium   Calcium-Vitamin D3 600-400 MG-UNIT Tabs   cholecalciferol 25 MCG (1000 UNIT) tablet Commonly known as: VITAMIN D3   meloxicam 7.5 MG tablet Commonly known as: MOBIC   multivitamin with minerals Tabs tablet   PROBIOTIC PO   ZINC PLUS VITAMIN C PO       TAKE these medications    acetaminophen 500 MG tablet Commonly known as: TYLENOL Take 500 mg by mouth every 6 (six) hours as needed for moderate pain.   albuterol 108 (90 Base) MCG/ACT inhaler Commonly known as: VENTOLIN HFA Inhale 1-2 puffs into the lungs every 6 (six) hours as needed for wheezing or shortness of breath.   amLODipine 5 MG tablet Commonly known as: NORVASC Take 1 tablet (5 mg total) by mouth at bedtime.   atorvastatin 20 MG tablet Commonly known as: LIPITOR Take 1 tablet (20 mg total) by mouth daily. What changed: when to take this   carvedilol 12.5 MG tablet Commonly known as: COREG Take 0.5 tablets (6.25 mg total) by mouth 2 (two) times daily with a meal.   ezetimibe 10 MG tablet Commonly known as: ZETIA Take 1 tablet (10 mg total) by mouth at bedtime.   famotidine 40 MG tablet Commonly known as: PEPCID Take 40 mg by mouth 2 (two) times daily.   gabapentin 300 MG capsule Commonly known as: NEURONTIN Take  a 300 mg capsule three times a day for two weeks following surgery.Then take a 300 mg capsule two times a day for two weeks. Then take a 300 mg capsule once a day for two weeks. Then discontinue.   methocarbamol 500 MG tablet Commonly known as: ROBAXIN Take 1 tablet (500 mg total) by mouth every 6 (six) hours as needed for muscle spasms.   oxyCODONE 5 MG immediate release tablet Commonly known as: Oxy IR/ROXICODONE Take 1-2 tablets (5-10 mg total) by mouth every 6 (six) hours as needed for severe pain.   rivaroxaban 10 MG Tabs tablet Commonly known as: XARELTO Take 1 tablet (10 mg total) by mouth daily with breakfast.   traMADol 50 MG tablet Commonly known as: ULTRAM Take 1-2 tablets (50-100 mg total) by mouth every 6 (six) hours as  needed for moderate pain.               Discharge Care Instructions  (From admission, onward)           Start     Ordered   12/14/22 0000  Weight bearing as tolerated        12/14/22 0732   12/14/22 0000  Change dressing       Comments: You may remove the bulky bandage (ACE wrap and gauze) two days after surgery. You will have an adhesive waterproof bandage underneath. Leave this in place until your first follow-up appointment.   12/14/22 0732            Follow-up Information     [name], MD. Schedule an appointment as soon as possible for a visit in 2 week(s).   Specialty: Orthopedic Surgery Contact information: [address] [place] [address] [id number]                 Signed: Shearon Balo, PA-C Orthopedic Surgery 12/18/2022, 3:51 PM

## 2022-12-19 ENCOUNTER — Telehealth: Payer: Self-pay | Admitting: Hematology

## 2022-12-19 NOTE — Telephone Encounter (Signed)
Called patient due to provider on call schedule change. Patient rescheduled and notified.

## 2022-12-20 DIAGNOSIS — M25661 Stiffness of right knee, not elsewhere classified: Secondary | ICD-10-CM | POA: Diagnosis not present

## 2022-12-22 ENCOUNTER — Other Ambulatory Visit: Payer: Medicare Other

## 2022-12-22 ENCOUNTER — Ambulatory Visit: Payer: Medicare Other | Admitting: Hematology

## 2022-12-22 DIAGNOSIS — M25661 Stiffness of right knee, not elsewhere classified: Secondary | ICD-10-CM | POA: Diagnosis not present

## 2022-12-25 DIAGNOSIS — M25661 Stiffness of right knee, not elsewhere classified: Secondary | ICD-10-CM | POA: Diagnosis not present

## 2022-12-27 DIAGNOSIS — M25661 Stiffness of right knee, not elsewhere classified: Secondary | ICD-10-CM | POA: Diagnosis not present

## 2022-12-29 DIAGNOSIS — M25661 Stiffness of right knee, not elsewhere classified: Secondary | ICD-10-CM | POA: Diagnosis not present

## 2023-01-03 DIAGNOSIS — M25661 Stiffness of right knee, not elsewhere classified: Secondary | ICD-10-CM | POA: Diagnosis not present

## 2023-01-05 DIAGNOSIS — M25661 Stiffness of right knee, not elsewhere classified: Secondary | ICD-10-CM | POA: Diagnosis not present

## 2023-01-08 ENCOUNTER — Encounter: Payer: Self-pay | Admitting: Cardiovascular Disease

## 2023-01-08 MED ORDER — CARVEDILOL 12.5 MG PO TABS: 6.2500 mg | ORAL_TABLET | Freq: Two times a day (BID) | ORAL | 3 refills | Status: DC

## 2023-01-10 DIAGNOSIS — M25661 Stiffness of right knee, not elsewhere classified: Secondary | ICD-10-CM | POA: Diagnosis not present

## 2023-01-12 DIAGNOSIS — M25661 Stiffness of right knee, not elsewhere classified: Secondary | ICD-10-CM | POA: Diagnosis not present

## 2023-01-16 DIAGNOSIS — M25661 Stiffness of right knee, not elsewhere classified: Secondary | ICD-10-CM | POA: Diagnosis not present

## 2023-01-17 DIAGNOSIS — Z5189 Encounter for other specified aftercare: Secondary | ICD-10-CM | POA: Diagnosis not present

## 2023-01-18 DIAGNOSIS — M25661 Stiffness of right knee, not elsewhere classified: Secondary | ICD-10-CM | POA: Diagnosis not present

## 2023-01-19 ENCOUNTER — Ambulatory Visit: Payer: Medicare Other | Admitting: Hematology

## 2023-01-19 ENCOUNTER — Other Ambulatory Visit: Payer: Medicare Other

## 2023-01-23 DIAGNOSIS — M25661 Stiffness of right knee, not elsewhere classified: Secondary | ICD-10-CM | POA: Diagnosis not present

## 2023-01-25 DIAGNOSIS — M25661 Stiffness of right knee, not elsewhere classified: Secondary | ICD-10-CM | POA: Diagnosis not present

## 2023-01-26 ENCOUNTER — Other Ambulatory Visit: Payer: Self-pay

## 2023-01-26 DIAGNOSIS — C9110 Chronic lymphocytic leukemia of B-cell type not having achieved remission: Secondary | ICD-10-CM

## 2023-01-29 ENCOUNTER — Inpatient Hospital Stay (HOSPITAL_BASED_OUTPATIENT_CLINIC_OR_DEPARTMENT_OTHER): Payer: Medicare Other | Admitting: Hematology

## 2023-01-29 ENCOUNTER — Inpatient Hospital Stay: Payer: Medicare Other | Attending: Hematology

## 2023-01-29 VITALS — BP 154/80 | HR 74 | Temp 97.0°F | Resp 20 | Wt 146.0 lb

## 2023-01-29 DIAGNOSIS — C9110 Chronic lymphocytic leukemia of B-cell type not having achieved remission: Secondary | ICD-10-CM

## 2023-01-29 DIAGNOSIS — Z853 Personal history of malignant neoplasm of breast: Secondary | ICD-10-CM | POA: Insufficient documentation

## 2023-01-29 DIAGNOSIS — Z08 Encounter for follow-up examination after completed treatment for malignant neoplasm: Secondary | ICD-10-CM | POA: Insufficient documentation

## 2023-01-29 LAB — CMP (CANCER CENTER ONLY)
ALT: 15 U/L (ref 0–44)
AST: 15 U/L (ref 15–41)
Albumin: 4.7 g/dL (ref 3.5–5.0)
Alkaline Phosphatase: 52 U/L (ref 38–126)
Anion gap: 6 (ref 5–15)
BUN: 12 mg/dL (ref 8–23)
CO2: 30 mmol/L (ref 22–32)
Calcium: 9.9 mg/dL (ref 8.9–10.3)
Chloride: 105 mmol/L (ref 98–111)
Creatinine: 0.68 mg/dL (ref 0.44–1.00)
GFR, Estimated: >60 mL/min (ref >60)
Glucose, Bld: 123 mg/dL — ABNORMAL HIGH (ref 70–99)
Potassium: 4 mmol/L (ref 3.5–5.1)
Sodium: 141 mmol/L (ref 135–145)
Total Bilirubin: 0.5 mg/dL (ref 0.3–1.2)
Total Protein: 6.9 g/dL (ref 6.5–8.1)

## 2023-01-29 LAB — CBC WITH DIFFERENTIAL (CANCER CENTER ONLY)
Abs Immature Granulocytes: 0.01 10*3/uL (ref 0.00–0.07)
Basophils Absolute: 0 10*3/uL (ref 0.0–0.1)
Basophils Relative: 1 %
Eosinophils Absolute: 0.2 10*3/uL (ref 0.0–0.5)
Eosinophils Relative: 4 %
HCT: 40.9 % (ref 36.0–46.0)
Hemoglobin: 13.4 g/dL (ref 12.0–15.0)
Immature Granulocytes: 0 %
Lymphocytes Relative: 22 %
Lymphs Abs: 1.1 10*3/uL (ref 0.7–4.0)
MCH: 29.5 pg (ref 26.0–34.0)
MCHC: 32.8 g/dL (ref 30.0–36.0)
MCV: 90.1 fL (ref 80.0–100.0)
Monocytes Absolute: 0.4 10*3/uL (ref 0.1–1.0)
Monocytes Relative: 8 %
Neutro Abs: 3.2 10*3/uL (ref 1.7–7.7)
Neutrophils Relative %: 65 %
Platelet Count: 248 10*3/uL (ref 150–400)
RBC: 4.54 MIL/uL (ref 3.87–5.11)
RDW: 13.2 % (ref 11.5–15.5)
WBC Count: 5 10*3/uL (ref 4.0–10.5)
nRBC: 0 % (ref 0.0–0.2)

## 2023-01-29 LAB — LACTATE DEHYDROGENASE: LDH: 171 U/L (ref 98–192)

## 2023-01-29 NOTE — Progress Notes (Signed)
HEMATOLOGY/ONCOLOGY CLINIC NOTE  Date of Service: 01/29/23   Patient Care Team: Merri Brunette, MD as PCP - General (Family Medicine) Lennette Bihari, MD as PCP - Cardiology (Cardiology)  CHIEF COMPLAINTS/PURPOSE OF CONSULTATION:  For continued evaluation and management of CLL  HISTORY OF PRESENTING ILLNESS:  Please see previous note for details on initial presentation  INTERVAL HISTORY:  [name] is here for continued evaluation and management of her CLL.   Patient was last seen by me on 06/23/2022 and she was doing well overall, except arthritis issues.   Patient notes she has been doing well overall without any new medical concerns. She notes she recently had knee replacement around 6 weeks ago without any issues. Patient notes she had her yearly mammogram in January of this year, which did not show any abnormalities.    She denies fever, chills, night sweats, unexpected weight loss, infection issues, shortness of breath, abdominal pain, chest pain, back pain, or leg swelling. She does complain of mild constipation.    MEDICAL HISTORY:  Past Medical History:  Diagnosis Date   Arthritis    Breast CA (HCC) dx'd 2015   left   Bronchiectasis (HCC)    Cancer (HCC)    hx of skin cancer    CLL (chronic lymphocytic leukemia) (HCC) dx'd 2015   Dyspnea    Dysrhythmia    PVCs   Elevated cholesterol 05/22/2012   Nuc stress test-Normal.   Endometrial polyp    GERD (gastroesophageal reflux disease)    Grade I diastolic dysfunction    noted on echo   History of palpitations    Hypertension 05/22/12   ECHO-EF >55% trace tricupsid regurgitation. There is mild mitral regurgitation.    MR (mitral regurgitation)    mild noted on echo   Plantar fasciitis of left foot    resolved   Pneumonia    history of x 2   PONV (postoperative nausea and vomiting)     SURGICAL HISTORY: Past Surgical History:  Procedure Laterality Date   ABDOMINAL SURGERY     Abdominoplasty    BRONCHIAL BIOPSY  06/29/2020   Procedure: BRONCHIAL BIOPSIES;  Surgeon: Leslye Peer, MD;  Location: [facility] ENDOSCOPY;  Service: Cardiopulmonary;;   BRONCHIAL BRUSHINGS  06/29/2020   Procedure: BRONCHIAL BRUSHINGS;  Surgeon: Leslye Peer, MD;  Location: [facility] ENDOSCOPY;  Service: Cardiopulmonary;;   BRONCHIAL WASHINGS  06/29/2020   Procedure: BRONCHIAL WASHINGS;  Surgeon: Leslye Peer, MD;  Location: [facility] ENDOSCOPY;  Service: Cardiopulmonary;;   CESAREAN SECTION     X 2   CHOLECYSTECTOMY     COLONSCOPY     DILATION AND CURETTAGE OF UTERUS     FOOT SURGERY Left    HYSTEROSCOPY     KNEE ARTHROTOMY Right 06/04/2019   Procedure: Right knee arthrotomy; scar excision;  Surgeon: Ollen Gross, MD;  Location: [facility] ORS;  Service: Orthopedics;  Laterality: Right;    KNEE CLOSED REDUCTION Right 07/20/2014   Procedure: RIGHT KNEE CLOSED MANIPULATION;  Surgeon: Loanne Drilling, MD;  Location: [facility] ORS;  Service: Orthopedics;  Laterality: Right;   KNEE SURGERY Right    arthroscopic   PARTIAL KNEE ARTHROPLASTY Left 2017   RADIOACTIVE SEED GUIDED EXCISIONAL BREAST BIOPSY N/A 10/06/2014   Procedure: RADIOACTIVE SEED GUIDED EXCISIONAL BREAST BIOPSY;  Surgeon: Emelia Loron, MD;  Location: [facility];  Service: General;  Laterality: N/A;   REFRACTIVE SURGERY     TOTAL KNEE ARTHROPLASTY Right 05/11/2014   Procedure:  RIGHT TOTAL KNEE ARTHROPLASTY;  Surgeon: Loanne Drilling, MD;  Location: [facility] ORS;  Service: Orthopedics;  Laterality: Right;   TOTAL KNEE REVISION Right 12/13/2022   Procedure: TOTAL KNEE REVISION;  Surgeon: Ollen Gross, MD;  Location: [facility];  Service: Orthopedics;  Laterality: Right;   TUBAL LIGATION     VIDEO BRONCHOSCOPY N/A 06/29/2020   Procedure: VIDEO BRONCHOSCOPY WITHOUT FLUORO;  Surgeon: Leslye Peer, MD;  Location: [facility] ENDOSCOPY;  Service: Cardiopulmonary;  Laterality: N/A;  with BAL   WRIST SURGERY Left     SOCIAL HISTORY: Social History    Socioeconomic History   Marital status: Married    Spouse name: Not on file   Number of children: Not on file   Years of education: Not on file   Highest education level: Not on file  Occupational History   Not on file  Tobacco Use   Smoking status: Never   Smokeless tobacco: Never  Vaping Use   Vaping Use: Never used  Substance and Sexual Activity   Alcohol use: Yes    Comment: occasional    Drug use: No   Sexual activity: Yes    Birth control/protection: Surgical, Post-menopausal    Comment: Hysterectomy  Other Topics Concern   Not on file  Social History Narrative   Not on file   Social Determinants of Health   Financial Resource Strain: Not on file  Food Insecurity: No Food Insecurity (12/13/2022)   Hunger Vital Sign    Worried About Running Out of Food in the Last Year: Never true    Ran Out of Food in the Last Year: Never true  Transportation Needs: No Transportation Needs (12/13/2022)   PRAPARE - Administrator, Civil Service (Medical): No    Lack of Transportation (Non-Medical): No  Physical Activity: Not on file  Stress: Not on file  Social Connections: Not on file  Intimate Partner Violence: Not At Risk (12/13/2022)   Humiliation, Afraid, Rape, and Kick questionnaire    Fear of Current or Ex-Partner: No    Emotionally Abused: No    Physically Abused: No    Sexually Abused: No    FAMILY HISTORY: Family History  Problem Relation Age of Onset   Heart disease Father    Dementia Father    Aneurysm Mother    Hyperlipidemia Mother    Alzheimer's disease Mother    Osteoarthritis Mother    Hypertension Brother    High Cholesterol Brother    Cancer - Other Paternal Grandmother        uterine   Hypertension Paternal Grandfather    Stroke Paternal Grandfather    Heart disease Maternal Grandfather    Cancer - Other Maternal Grandfather        lung   Cushing syndrome Brother    Healthy Son    Healthy Daughter     ALLERGIES:  has No Known  Allergies.  MEDICATIONS:  Current Outpatient Medications  Medication Sig Dispense Refill   acetaminophen (TYLENOL) 500 MG tablet Take 500 mg by mouth every 6 (six) hours as needed for moderate pain.     albuterol (VENTOLIN HFA) 108 (90 Base) MCG/ACT inhaler Inhale 1-2 puffs into the lungs every 6 (six) hours as needed for wheezing or shortness of breath.     amLODipine (NORVASC) 5 MG tablet Take 1 tablet (5 mg total) by mouth at bedtime. 90 tablet 1   atorvastatin (LIPITOR) 20 MG tablet Take 1 tablet (20 mg total) by mouth daily. (  Patient taking differently: Take 20 mg by mouth at bedtime.) 90 tablet 1   carvedilol (COREG) 12.5 MG tablet Take 0.5 tablets (6.25 mg total) by mouth 2 (two) times daily with a meal. 45 tablet 3   ezetimibe (ZETIA) 10 MG tablet Take 1 tablet (10 mg total) by mouth at bedtime. 90 tablet 1   famotidine (PEPCID) 40 MG tablet Take 40 mg by mouth 2 (two) times daily.     gabapentin (NEURONTIN) 300 MG capsule Take a 300 mg capsule three times a day for two weeks following surgery.Then take a 300 mg capsule two times a day for two weeks. Then take a 300 mg capsule once a day for two weeks. Then discontinue. 84 capsule 0   methocarbamol (ROBAXIN) 500 MG tablet Take 1 tablet (500 mg total) by mouth every 6 (six) hours as needed for muscle spasms. 42 tablet 0   oxyCODONE (OXY IR/ROXICODONE) 5 MG immediate release tablet Take 1-2 tablets (5-10 mg total) by mouth every 6 (six) hours as needed for severe pain. 42 tablet 0   rivaroxaban (XARELTO) 10 MG TABS tablet Take 1 tablet (10 mg total) by mouth daily with breakfast. 20 tablet 0   traMADol (ULTRAM) 50 MG tablet Take 1-2 tablets (50-100 mg total) by mouth every 6 (six) hours as needed for moderate pain. 42 tablet 0   No current facility-administered medications for this visit.    REVIEW OF SYSTEMS:   10 Point review of Systems was done is negative except as noted above.  PHYSICAL EXAMINATION:  .BP (!) 154/80   Pulse 74    Temp (!) 97 F (36.1 C)   Resp 20   Wt 146 lb (66.2 kg)   LMP 11/19/2011 (LMP Unknown)   SpO2 99%   BMI 27.59 kg/m   .LMP 11/19/2011 (LMP Unknown)  NAD GENERAL:alert, in no acute distress and comfortable SKIN: no acute rashes, no significant lesions EYES: conjunctiva are pink and non-injected, sclera anicteric OROPHARYNX: MMM, no exudates, no oropharyngeal erythema or ulceration NECK: supple, no JVD LYMPH:  no palpable lymphadenopathy in the cervical, axillary or inguinal regions LUNGS: clear to auscultation b/l with normal respiratory effort HEART: regular rate & rhythm ABDOMEN:  normoactive bowel sounds , non tender, not distended. Extremity: no pedal edema PSYCH: alert & oriented x 3 with fluent speech NEURO: no focal motor/sensory deficits   LABORATORY DATA:    .    Latest Ref Rng & Units 01/29/2023   10:44 AM 12/14/2022    3:27 AM 12/04/2022   10:00 AM  CBC  WBC 4.0 - 10.5 K/uL 5.0  10.7  4.7   Hemoglobin 12.0 - 15.0 g/dL 82.9  56.2  13.0   Hematocrit 36.0 - 46.0 % 40.9  33.9  40.8   Platelets 150 - 400 K/uL 248  203  248     .    Latest Ref Rng & Units 01/29/2023   10:44 AM 12/14/2022    3:27 AM 12/04/2022   10:00 AM  CMP  Glucose 70 - 99 mg/dL 865  784  696   BUN 8 - 23 mg/dL 12  10  8    Creatinine 0.44 - 1.00 mg/dL 2.95  2.84  1.32   Sodium 135 - 145 mmol/L 141  141  140   Potassium 3.5 - 5.1 mmol/L 4.0  4.0  4.2   Chloride 98 - 111 mmol/L 105  108  104   CO2 22 - 32 mmol/L 30  25  28  Calcium 8.9 - 10.3 mg/dL 9.9  8.9  9.3   Total Protein 6.5 - 8.1 g/dL 6.9     Total Bilirubin 0.3 - 1.2 mg/dL 0.5     Alkaline Phos 38 - 126 U/L 52     AST 15 - 41 U/L 15     ALT 0 - 44 U/L 15      . Lab Results  Component Value Date   LDH 171 01/29/2023      03/03/2020 FISH (CLL Prognosis) Panel:     RADIOGRAPHIC STUDIES: I have personally reviewed the radiological images as listed and agreed with the findings in the report.  CT CHEST ABDOMEN PELVIS W  CONTRAST  Result Date: 09/19/2021 CLINICAL DATA:  SLL/CLL, assess treatment response immunotherapy EXAM: CT CHEST, ABDOMEN, AND PELVIS WITH CONTRAST TECHNIQUE: Multidetector CT imaging of the chest, abdomen and pelvis was performed following the standard protocol during bolus administration of intravenous contrast. CONTRAST:  80mL OMNIPAQUE IOHEXOL 350 MG/ML SOLN, additional oral enteric contrast COMPARISON:  02/17/2021 FINDINGS: CT CHEST FINDINGS Cardiovascular: Aortic atherosclerosis. Normal heart size. No pericardial effusion. Mediastinum/Nodes: No enlarged mediastinal, hilar, or axillary lymph nodes. Thyroid gland, trachea, and esophagus demonstrate no significant findings. Lungs/Pleura: Stable, benign 0.3 cm nodule of the right upper lobe (series 4, image 51). No pleural effusion or pneumothorax. Musculoskeletal: No chest wall mass or suspicious bone lesions identified. CT ABDOMEN PELVIS FINDINGS Hepatobiliary: No focal liver abnormality is seen. Status post cholecystectomy. No biliary dilatation. Pancreas: Unremarkable. No pancreatic ductal dilatation or surrounding inflammatory changes. Spleen: Normal in size without significant abnormality. Adrenals/Urinary Tract: Adrenal glands are unremarkable. Kidneys are normal, without renal calculi, solid lesion, or hydronephrosis. Bladder is unremarkable. Stomach/Bowel: Stomach is within normal limits. Appendix appears normal. No evidence of bowel wall thickening, distention, or inflammatory changes. Vascular/Lymphatic: No significant vascular findings are present. No enlarged abdominal or pelvic lymph nodes. Reproductive: No mass or other abnormality. Other: No abdominal wall hernia or abnormality. No abdominopelvic ascites. Musculoskeletal: No acute or significant osseous findings. IMPRESSION: 1. No evidence of persistent lymphadenopathy in the chest, abdomen, or pelvis. 2. Normal spleen. 3. Status post cholecystectomy. Aortic Atherosclerosis (ICD10-I70.0).  Electronically Signed   By: Jearld Lesch M.D.   On: 09/19/2021 14:53     ASSESSMENT & PLAN:   67 yo with   1) Rai Stage 1 Chronic lymphocytic Leukemia. Found to have trisomy 12 and 13q deletion via 03/03/2020 FISH (CLL Prognosis) Panel  Patient had presented with increasing dyspnea and was found to have CLL involvement of her airways.  Her dyspnea has completely resolved at this time. PLAN: -Patient's labs from today were reviewed with her in detail.  CBC CMP stable LDH within normal limits Patient has no lab or clinical symptoms suggestive of CLL progression at this time No indication for additional treatment of CLL at this time We shall continue active surveillance.  2) history of neoplasm of left breast, primary tumor staging category Tis: lobular carcinoma in situ (LCIS) Left breast LCIS status post lumpectomy 10/06/2014; Started tamoxifen for breast cancer risk reduction 20 mg daily 11/18/2014 , decrease to 10 mg on 11/22/2017 completed February 2021.   Breast cancer surveillance:  Continue yearly MMGs with PCP  FOLLOW-UP: RTC with Dr Candise Che with labs in 6 months  The total time spent in the appointment was 20 minutes* .  All of the patient's questions were answered with apparent satisfaction. The patient knows to call the clinic with any problems, questions or concerns.   Kahla Risdon  Candise Che MD MS AAHIVMS [facility] Hematology/Oncology Physician [facility]  .*Total Encounter Time as defined by the Centers for Medicare and Medicaid Services includes, in addition to the face-to-face time of a patient visit (documented in the note above) non-face-to-face time: obtaining and reviewing outside history, ordering and reviewing medications, tests or procedures, care coordination (communications with other health care professionals or caregivers) and documentation in the medical record.   I, Ok Edwards, am acting as a Neurosurgeon for Wyvonnia Lora, MD.  .I have reviewed the above  documentation for accuracy and completeness, and I agree with the above. Johney Maine MD

## 2023-01-30 ENCOUNTER — Telehealth: Payer: Self-pay | Admitting: Hematology

## 2023-01-30 DIAGNOSIS — M25661 Stiffness of right knee, not elsewhere classified: Secondary | ICD-10-CM | POA: Diagnosis not present

## 2023-02-01 DIAGNOSIS — M25661 Stiffness of right knee, not elsewhere classified: Secondary | ICD-10-CM | POA: Diagnosis not present

## 2023-02-06 DIAGNOSIS — M25661 Stiffness of right knee, not elsewhere classified: Secondary | ICD-10-CM | POA: Diagnosis not present

## 2023-02-08 DIAGNOSIS — M25661 Stiffness of right knee, not elsewhere classified: Secondary | ICD-10-CM | POA: Diagnosis not present

## 2023-02-09 ENCOUNTER — Ambulatory Visit: Payer: Medicare Other | Admitting: Cardiovascular Disease

## 2023-02-13 DIAGNOSIS — M25661 Stiffness of right knee, not elsewhere classified: Secondary | ICD-10-CM | POA: Diagnosis not present

## 2023-02-15 ENCOUNTER — Encounter: Payer: Self-pay | Admitting: Hematology

## 2023-02-23 DIAGNOSIS — M25661 Stiffness of right knee, not elsewhere classified: Secondary | ICD-10-CM | POA: Diagnosis not present

## 2023-02-26 DIAGNOSIS — M25661 Stiffness of right knee, not elsewhere classified: Secondary | ICD-10-CM | POA: Diagnosis not present

## 2023-02-28 DIAGNOSIS — M25661 Stiffness of right knee, not elsewhere classified: Secondary | ICD-10-CM | POA: Diagnosis not present

## 2023-03-01 DIAGNOSIS — E785 Hyperlipidemia, unspecified: Secondary | ICD-10-CM | POA: Diagnosis not present

## 2023-03-01 DIAGNOSIS — I10 Essential (primary) hypertension: Secondary | ICD-10-CM | POA: Diagnosis not present

## 2023-03-01 DIAGNOSIS — I700 Atherosclerosis of aorta: Secondary | ICD-10-CM | POA: Diagnosis not present

## 2023-03-02 LAB — COMPREHENSIVE METABOLIC PANEL
ALT: 14 IU/L (ref 0–32)
AST: 17 IU/L (ref 0–40)
Albumin/Globulin Ratio: 2.8 — ABNORMAL HIGH (ref 1.2–2.2)
Albumin: 4.5 g/dL (ref 3.9–4.9)
Alkaline Phosphatase: 65 IU/L (ref 44–121)
BUN/Creatinine Ratio: 25 (ref 12–28)
BUN: 15 mg/dL (ref 8–27)
Bilirubin Total: 0.3 mg/dL (ref 0.0–1.2)
CO2: 25 mmol/L (ref 20–29)
Calcium: 9.3 mg/dL (ref 8.7–10.3)
Chloride: 104 mmol/L (ref 96–106)
Creatinine, Ser: 0.6 mg/dL (ref 0.57–1.00)
Globulin, Total: 1.6 g/dL (ref 1.5–4.5)
Glucose: 121 mg/dL — ABNORMAL HIGH (ref 70–99)
Potassium: 4.9 mmol/L (ref 3.5–5.2)
Sodium: 143 mmol/L (ref 134–144)
Total Protein: 6.1 g/dL (ref 6.0–8.5)
eGFR: 100 mL/min/{1.73_m2} (ref >59)

## 2023-03-02 LAB — LIPOPROTEIN A (LPA): Lipoprotein (a): 24.7 nmol/L (ref <75.0)

## 2023-03-02 LAB — LIPID PANEL
Chol/HDL Ratio: 2.3 ratio (ref 0.0–4.4)
Cholesterol, Total: 165 mg/dL (ref 100–199)
HDL: 73 mg/dL (ref >39)
LDL Chol Calc (NIH): 77 mg/dL (ref 0–99)
Triglycerides: 81 mg/dL (ref 0–149)
VLDL Cholesterol Cal: 15 mg/dL (ref 5–40)

## 2023-03-07 DIAGNOSIS — C9110 Chronic lymphocytic leukemia of B-cell type not having achieved remission: Secondary | ICD-10-CM | POA: Diagnosis not present

## 2023-03-07 DIAGNOSIS — M25661 Stiffness of right knee, not elsewhere classified: Secondary | ICD-10-CM | POA: Diagnosis not present

## 2023-03-07 DIAGNOSIS — I10 Essential (primary) hypertension: Secondary | ICD-10-CM | POA: Diagnosis not present

## 2023-03-07 DIAGNOSIS — R739 Hyperglycemia, unspecified: Secondary | ICD-10-CM | POA: Diagnosis not present

## 2023-03-07 DIAGNOSIS — E7800 Pure hypercholesterolemia, unspecified: Secondary | ICD-10-CM | POA: Diagnosis not present

## 2023-03-09 DIAGNOSIS — M25661 Stiffness of right knee, not elsewhere classified: Secondary | ICD-10-CM | POA: Diagnosis not present

## 2023-03-12 DIAGNOSIS — M25661 Stiffness of right knee, not elsewhere classified: Secondary | ICD-10-CM | POA: Diagnosis not present

## 2023-03-14 DIAGNOSIS — M25661 Stiffness of right knee, not elsewhere classified: Secondary | ICD-10-CM | POA: Diagnosis not present

## 2023-03-19 DIAGNOSIS — M25661 Stiffness of right knee, not elsewhere classified: Secondary | ICD-10-CM | POA: Diagnosis not present

## 2023-03-23 DIAGNOSIS — M25661 Stiffness of right knee, not elsewhere classified: Secondary | ICD-10-CM | POA: Diagnosis not present

## 2023-03-28 DIAGNOSIS — M25661 Stiffness of right knee, not elsewhere classified: Secondary | ICD-10-CM | POA: Diagnosis not present

## 2023-03-30 DIAGNOSIS — M25661 Stiffness of right knee, not elsewhere classified: Secondary | ICD-10-CM | POA: Diagnosis not present

## 2023-04-02 ENCOUNTER — Other Ambulatory Visit: Payer: Self-pay | Admitting: *Deleted

## 2023-04-02 MED ORDER — ATORVASTATIN CALCIUM 20 MG PO TABS: 20.0000 mg | ORAL_TABLET | Freq: Every day | ORAL | 2 refills | Status: AC

## 2023-04-09 ENCOUNTER — Encounter: Payer: Self-pay | Admitting: Cardiovascular Disease

## 2023-04-09 ENCOUNTER — Other Ambulatory Visit: Payer: Self-pay

## 2023-04-09 MED ORDER — CARVEDILOL 12.5 MG PO TABS: 6.2500 mg | ORAL_TABLET | Freq: Two times a day (BID) | ORAL | 2 refills | Status: AC

## 2023-04-10 DIAGNOSIS — M25661 Stiffness of right knee, not elsewhere classified: Secondary | ICD-10-CM | POA: Diagnosis not present

## 2023-04-11 DIAGNOSIS — K080 Exfoliation of teeth due to systemic causes: Secondary | ICD-10-CM | POA: Diagnosis not present

## 2023-04-13 DIAGNOSIS — M25661 Stiffness of right knee, not elsewhere classified: Secondary | ICD-10-CM | POA: Diagnosis not present

## 2023-04-16 DIAGNOSIS — M19071 Primary osteoarthritis, right ankle and foot: Secondary | ICD-10-CM | POA: Diagnosis not present

## 2023-04-16 DIAGNOSIS — M25661 Stiffness of right knee, not elsewhere classified: Secondary | ICD-10-CM | POA: Diagnosis not present

## 2023-04-17 DIAGNOSIS — Z713 Dietary counseling and surveillance: Secondary | ICD-10-CM | POA: Diagnosis not present

## 2023-04-17 DIAGNOSIS — Z6827 Body mass index (BMI) 27.0-27.9, adult: Secondary | ICD-10-CM | POA: Diagnosis not present

## 2023-04-18 DIAGNOSIS — M25661 Stiffness of right knee, not elsewhere classified: Secondary | ICD-10-CM | POA: Diagnosis not present

## 2023-04-19 DIAGNOSIS — Z96651 Presence of right artificial knee joint: Secondary | ICD-10-CM | POA: Diagnosis not present

## 2023-04-19 DIAGNOSIS — Z471 Aftercare following joint replacement surgery: Secondary | ICD-10-CM | POA: Diagnosis not present

## 2023-04-24 DIAGNOSIS — K080 Exfoliation of teeth due to systemic causes: Secondary | ICD-10-CM | POA: Diagnosis not present

## 2023-04-27 DIAGNOSIS — M25661 Stiffness of right knee, not elsewhere classified: Secondary | ICD-10-CM | POA: Diagnosis not present

## 2023-04-30 DIAGNOSIS — M25661 Stiffness of right knee, not elsewhere classified: Secondary | ICD-10-CM | POA: Diagnosis not present

## 2023-05-04 DIAGNOSIS — M25661 Stiffness of right knee, not elsewhere classified: Secondary | ICD-10-CM | POA: Diagnosis not present

## 2023-05-08 DIAGNOSIS — M19071 Primary osteoarthritis, right ankle and foot: Secondary | ICD-10-CM | POA: Diagnosis not present

## 2023-05-08 DIAGNOSIS — M25661 Stiffness of right knee, not elsewhere classified: Secondary | ICD-10-CM | POA: Diagnosis not present

## 2023-05-11 DIAGNOSIS — M25661 Stiffness of right knee, not elsewhere classified: Secondary | ICD-10-CM | POA: Diagnosis not present

## 2023-05-21 DIAGNOSIS — M25661 Stiffness of right knee, not elsewhere classified: Secondary | ICD-10-CM | POA: Diagnosis not present

## 2023-05-24 DIAGNOSIS — M25661 Stiffness of right knee, not elsewhere classified: Secondary | ICD-10-CM | POA: Diagnosis not present

## 2023-06-19 DIAGNOSIS — E669 Obesity, unspecified: Secondary | ICD-10-CM | POA: Diagnosis not present

## 2023-06-19 DIAGNOSIS — I10 Essential (primary) hypertension: Secondary | ICD-10-CM | POA: Diagnosis not present

## 2023-06-19 DIAGNOSIS — K219 Gastro-esophageal reflux disease without esophagitis: Secondary | ICD-10-CM | POA: Diagnosis not present

## 2023-06-19 DIAGNOSIS — E782 Mixed hyperlipidemia: Secondary | ICD-10-CM | POA: Diagnosis not present

## 2023-07-27 ENCOUNTER — Other Ambulatory Visit: Payer: Self-pay

## 2023-07-27 DIAGNOSIS — C9110 Chronic lymphocytic leukemia of B-cell type not having achieved remission: Secondary | ICD-10-CM

## 2023-07-30 ENCOUNTER — Inpatient Hospital Stay: Payer: Medicare Other | Admitting: Hematology

## 2023-07-30 ENCOUNTER — Inpatient Hospital Stay: Payer: Medicare Other

## 2023-07-30 ENCOUNTER — Telehealth: Payer: Self-pay | Admitting: Hematology

## 2023-07-30 NOTE — Telephone Encounter (Signed)
Patient called in to cancel and reschedule appointment due to being sick currently with the flu, patient is aware of rescheduled appointment times/dates for follow up

## 2023-08-02 DIAGNOSIS — Z85828 Personal history of other malignant neoplasm of skin: Secondary | ICD-10-CM | POA: Diagnosis not present

## 2023-08-02 DIAGNOSIS — L570 Actinic keratosis: Secondary | ICD-10-CM | POA: Diagnosis not present

## 2023-08-02 DIAGNOSIS — L821 Other seborrheic keratosis: Secondary | ICD-10-CM | POA: Diagnosis not present

## 2023-08-02 DIAGNOSIS — D225 Melanocytic nevi of trunk: Secondary | ICD-10-CM | POA: Diagnosis not present

## 2023-08-08 DIAGNOSIS — J208 Acute bronchitis due to other specified organisms: Secondary | ICD-10-CM | POA: Diagnosis not present

## 2023-08-22 DIAGNOSIS — Z6826 Body mass index (BMI) 26.0-26.9, adult: Secondary | ICD-10-CM | POA: Diagnosis not present

## 2023-08-24 DIAGNOSIS — M19071 Primary osteoarthritis, right ankle and foot: Secondary | ICD-10-CM | POA: Diagnosis not present

## 2023-08-28 ENCOUNTER — Ambulatory Visit: Payer: Medicare Other | Admitting: Hematology

## 2023-08-28 ENCOUNTER — Inpatient Hospital Stay: Payer: Medicare Other | Attending: Internal Medicine

## 2023-08-28 ENCOUNTER — Other Ambulatory Visit: Payer: Medicare Other

## 2023-08-28 ENCOUNTER — Inpatient Hospital Stay: Payer: Medicare Other | Admitting: Hematology

## 2023-08-28 VITALS — BP 124/56 | HR 76 | Temp 97.9°F | Resp 16 | Ht 61.0 in | Wt 141.0 lb

## 2023-08-28 DIAGNOSIS — C9110 Chronic lymphocytic leukemia of B-cell type not having achieved remission: Secondary | ICD-10-CM | POA: Insufficient documentation

## 2023-08-28 DIAGNOSIS — Z853 Personal history of malignant neoplasm of breast: Secondary | ICD-10-CM | POA: Diagnosis not present

## 2023-08-28 DIAGNOSIS — Z6826 Body mass index (BMI) 26.0-26.9, adult: Secondary | ICD-10-CM | POA: Diagnosis not present

## 2023-08-28 DIAGNOSIS — Z01419 Encounter for gynecological examination (general) (routine) without abnormal findings: Secondary | ICD-10-CM | POA: Diagnosis not present

## 2023-08-28 DIAGNOSIS — Z124 Encounter for screening for malignant neoplasm of cervix: Secondary | ICD-10-CM | POA: Diagnosis not present

## 2023-08-28 LAB — CBC WITH DIFFERENTIAL (CANCER CENTER ONLY)
Abs Immature Granulocytes: 0.02 10*3/uL (ref 0.00–0.07)
Basophils Absolute: 0.1 10*3/uL (ref 0.0–0.1)
Basophils Relative: 1 %
Eosinophils Absolute: 0.2 10*3/uL (ref 0.0–0.5)
Eosinophils Relative: 3 %
HCT: 39.5 % (ref 36.0–46.0)
Hemoglobin: 13.1 g/dL (ref 12.0–15.0)
Immature Granulocytes: 0 %
Lymphocytes Relative: 23 %
Lymphs Abs: 1.3 10*3/uL (ref 0.7–4.0)
MCH: 29.4 pg (ref 26.0–34.0)
MCHC: 33.2 g/dL (ref 30.0–36.0)
MCV: 88.8 fL (ref 80.0–100.0)
Monocytes Absolute: 0.4 10*3/uL (ref 0.1–1.0)
Monocytes Relative: 7 %
Neutro Abs: 3.8 10*3/uL (ref 1.7–7.7)
Neutrophils Relative %: 66 %
Platelet Count: 276 10*3/uL (ref 150–400)
RBC: 4.45 MIL/uL (ref 3.87–5.11)
RDW: 14.6 % (ref 11.5–15.5)
WBC Count: 5.7 10*3/uL (ref 4.0–10.5)
nRBC: 0 % (ref 0.0–0.2)

## 2023-08-28 LAB — CMP (CANCER CENTER ONLY)
ALT: 14 U/L (ref 0–44)
AST: 17 U/L (ref 15–41)
Albumin: 4.7 g/dL (ref 3.5–5.0)
Alkaline Phosphatase: 49 U/L (ref 38–126)
Anion gap: 7 (ref 5–15)
BUN: 15 mg/dL (ref 8–23)
CO2: 31 mmol/L (ref 22–32)
Calcium: 10.1 mg/dL (ref 8.9–10.3)
Chloride: 103 mmol/L (ref 98–111)
Creatinine: 0.67 mg/dL (ref 0.44–1.00)
GFR, Estimated: >60 mL/min (ref >60)
Glucose, Bld: 102 mg/dL — ABNORMAL HIGH (ref 70–99)
Potassium: 3.9 mmol/L (ref 3.5–5.1)
Sodium: 141 mmol/L (ref 135–145)
Total Bilirubin: 0.6 mg/dL (ref <1.2)
Total Protein: 7.2 g/dL (ref 6.5–8.1)

## 2023-08-28 LAB — LACTATE DEHYDROGENASE: LDH: 174 U/L (ref 98–192)

## 2023-08-28 NOTE — Progress Notes (Signed)
HEMATOLOGY/ONCOLOGY CLINIC NOTE  Date of Service: 08/28/23   Patient Care Team: Merri Brunette, MD as PCP - General (Family Medicine) Lennette Bihari, MD as PCP - Cardiology (Cardiology)  CHIEF COMPLAINTS/PURPOSE OF CONSULTATION:  For continued evaluation and management of CLL  HISTORY OF PRESENTING ILLNESS:  Please see previous note for details on initial presentation  INTERVAL HISTORY:  Colleen Shannon is here for continued evaluation and management of her CLL.   Patient was last seen by me on 01/27/2023 and she complained of mild constipation, but was doing well overall.   Patient notes she has been doing well overall since our last visit. She denies any new infection issues, fever, chills, night sweats, unexpected weight loss, back pain, abdominal pain, chest pain, or leg swelling.   She recently had knee replacement last month, which went well.   She complains of right ankle pain and she follows up with her orthopedic physician.   Patient notes she recently had 2 infections since our last visit and she developed productive cough that lasted for 1-2 months.   MEDICAL HISTORY:  Past Medical History:  Diagnosis Date   Arthritis    Breast CA (HCC) dx'd 2015   left   Bronchiectasis (HCC)    Cancer (HCC)    hx of skin cancer    CLL (chronic lymphocytic leukemia) (HCC) dx'd 2015   Dyspnea    Dysrhythmia    PVCs   Elevated cholesterol 05/22/2012   Nuc stress test-Normal.   Endometrial polyp    GERD (gastroesophageal reflux disease)    Grade I diastolic dysfunction    noted on echo   History of palpitations    Hypertension 05/22/12   ECHO-EF >55% trace tricupsid regurgitation. There is mild mitral regurgitation.    MR (mitral regurgitation)    mild noted on echo   Plantar fasciitis of left foot    resolved   Pneumonia    history of x 2   PONV (postoperative nausea and vomiting)     SURGICAL HISTORY: Past Surgical History:  Procedure Laterality Date    ABDOMINAL SURGERY     Abdominoplasty   BRONCHIAL BIOPSY  06/29/2020   Procedure: BRONCHIAL BIOPSIES;  Surgeon: Leslye Peer, MD;  Location: [facility] ENDOSCOPY;  Service: Cardiopulmonary;;   BRONCHIAL BRUSHINGS  06/29/2020   Procedure: BRONCHIAL BRUSHINGS;  Surgeon: Leslye Peer, MD;  Location: [facility] ENDOSCOPY;  Service: Cardiopulmonary;;   BRONCHIAL WASHINGS  06/29/2020   Procedure: BRONCHIAL WASHINGS;  Surgeon: Leslye Peer, MD;  Location: [facility] ENDOSCOPY;  Service: Cardiopulmonary;;   CESAREAN SECTION     X 2   CHOLECYSTECTOMY     COLONSCOPY     DILATION AND CURETTAGE OF UTERUS     FOOT SURGERY Left    HYSTEROSCOPY     KNEE ARTHROTOMY Right 06/04/2019   Procedure: Right knee arthrotomy; scar excision;  Surgeon: Ollen Gross, MD;  Location: [facility] ORS;  Service: Orthopedics;  Laterality: Right;    KNEE CLOSED REDUCTION Right 07/20/2014   Procedure: RIGHT KNEE CLOSED MANIPULATION;  Surgeon: Loanne Drilling, MD;  Location: [facility] ORS;  Service: Orthopedics;  Laterality: Right;   KNEE SURGERY Right    arthroscopic   PARTIAL KNEE ARTHROPLASTY Left 2017   RADIOACTIVE SEED GUIDED EXCISIONAL BREAST BIOPSY N/A 10/06/2014   Procedure: RADIOACTIVE SEED GUIDED EXCISIONAL BREAST BIOPSY;  Surgeon: Emelia Loron, MD;  Location: [facility];  Service: General;  Laterality: N/A;   REFRACTIVE SURGERY  TOTAL KNEE ARTHROPLASTY Right 05/11/2014   Procedure: RIGHT TOTAL KNEE ARTHROPLASTY;  Surgeon: Loanne Drilling, MD;  Location: [facility];  Service: Orthopedics;  Laterality: Right;   TOTAL KNEE REVISION Right 12/13/2022   Procedure: TOTAL KNEE REVISION;  Surgeon: Ollen Gross, MD;  Location: [facility];  Service: Orthopedics;  Laterality: Right;   TUBAL LIGATION     VIDEO BRONCHOSCOPY N/A 06/29/2020   Procedure: VIDEO BRONCHOSCOPY WITHOUT FLUORO;  Surgeon: Leslye Peer, MD;  Location[facility] ENDOSCOPY;  Service: Cardiopulmonary;  Laterality: N/A;  with BAL   WRIST SURGERY Left     SOCIAL  HISTORY: Social History   Socioeconomic History   Marital status: Married    Spouse name: Not on file   Number of children: Not on file   Years of education: Not on file   Highest education level: Not on file  Occupational History   Not on file  Tobacco Use   Smoking status: Never   Smokeless tobacco: Never  Vaping Use   Vaping status: Never Used  Substance and Sexual Activity   Alcohol use: Yes    Comment: occasional    Drug use: No   Sexual activity: Yes    Birth control/protection: Surgical, Post-menopausal    Comment: Hysterectomy  Other Topics Concern   Not on file  Social History Narrative   Not on file   Social Determinants of Health   Financial Resource Strain: Not on file  Food Insecurity: No Food Insecurity (12/13/2022)   Hunger Vital Sign    Worried About Running Out of Food in the Last Year: Never true    Ran Out of Food in the Last Year: Never true  Transportation Needs: No Transportation Needs (12/13/2022)   PRAPARE - Administrator, Civil Service (Medical): No    Lack of Transportation (Non-Medical): No  Physical Activity: Not on file  Stress: Not on file  Social Connections: Unknown (02/21/2022)   Received from [facility]   Social Network    Social Network: Not on file  Intimate Partner Violence: Not At Risk (12/13/2022)   Humiliation, Afraid, Rape, and Kick questionnaire    Fear of Current or Ex-Partner: No    Emotionally Abused: No    Physically Abused: No    Sexually Abused: No    FAMILY HISTORY: Family History  Problem Relation Age of Onset   Heart disease Father    Dementia Father    Aneurysm Mother    Hyperlipidemia Mother    Alzheimer's disease Mother    Osteoarthritis Mother    Hypertension Brother    High Cholesterol Brother    Cancer - Other Paternal Grandmother        uterine   Hypertension Paternal Grandfather    Stroke Paternal Grandfather    Heart disease Maternal Grandfather    Cancer - Other  Maternal Grandfather        lung   Cushing syndrome Brother    Healthy Son    Healthy Daughter     ALLERGIES:  has No Known Allergies.  MEDICATIONS:  Current Outpatient Medications  Medication Sig Dispense Refill   acetaminophen (TYLENOL) 500 MG tablet Take 500 mg by mouth every 6 (six) hours as needed for moderate pain.     albuterol (VENTOLIN HFA) 108 (90 Base) MCG/ACT inhaler Inhale 1-2 puffs into the lungs every 6 (six) hours as needed for wheezing or shortness of breath.     amLODipine (NORVASC) 5 MG tablet Take 1  tablet (5 mg total) by mouth at bedtime. 90 tablet 1   atorvastatin (LIPITOR) 20 MG tablet Take 1 tablet (20 mg total) by mouth at bedtime. 90 tablet 2   carvedilol (COREG) 12.5 MG tablet Take 0.5 tablets (6.25 mg total) by mouth 2 (two) times daily with a meal. 90 tablet 2   ezetimibe (ZETIA) 10 MG tablet Take 1 tablet (10 mg total) by mouth at bedtime. 90 tablet 1   famotidine (PEPCID) 40 MG tablet Take 40 mg by mouth 2 (two) times daily.     gabapentin (NEURONTIN) 300 MG capsule Take a 300 mg capsule three times a day for two weeks following surgery.Then take a 300 mg capsule two times a day for two weeks. Then take a 300 mg capsule once a day for two weeks. Then discontinue. 84 capsule 0   methocarbamol (ROBAXIN) 500 MG tablet Take 1 tablet (500 mg total) by mouth every 6 (six) hours as needed for muscle spasms. 42 tablet 0   oxyCODONE (OXY IR/ROXICODONE) 5 MG immediate release tablet Take 1-2 tablets (5-10 mg total) by mouth every 6 (six) hours as needed for severe pain. 42 tablet 0   rivaroxaban (XARELTO) 10 MG TABS tablet Take 1 tablet (10 mg total) by mouth daily with breakfast. 20 tablet 0   traMADol (ULTRAM) 50 MG tablet Take 1-2 tablets (50-100 mg total) by mouth every 6 (six) hours as needed for moderate pain. 42 tablet 0   No current facility-administered medications for this visit.    REVIEW OF SYSTEMS:   10 Point review of Systems was done is negative except  as noted above.  PHYSICAL EXAMINATION:  .BP (!) 124/56 (BP Location: Left Arm, Patient Position: Sitting) Comment: nurse notified  Pulse 76   Temp 97.9 F (36.6 C) (Temporal)   Resp 16   Ht 5\' 1"  (1.549 m)   Wt 141 lb (64 kg)   LMP 11/19/2011 (LMP Unknown)   SpO2 100%   BMI 26.64 kg/m  NAD GENERAL:alert, in no acute distress and comfortable SKIN: no acute rashes, no significant lesions EYES: conjunctiva are pink and non-injected, sclera anicteric OROPHARYNX: MMM, no exudates, no oropharyngeal erythema or ulceration NECK: supple, no JVD LYMPH:  no palpable lymphadenopathy in the cervical, axillary or inguinal regions LUNGS: clear to auscultation b/l with normal respiratory effort HEART: regular rate & rhythm ABDOMEN:  normoactive bowel sounds , non tender, not distended. Extremity: no pedal edema PSYCH: alert & oriented x 3 with fluent speech NEURO: no focal motor/sensory deficits   LABORATORY DATA:    .    Latest Ref Rng & Units 08/28/2023   12:07 PM 01/29/2023   10:44 AM 12/14/2022    3:27 AM  CBC  WBC 4.0 - 10.5 K/uL 5.7  5.0  10.7   Hemoglobin 12.0 - 15.0 g/dL 16.1  09.6  04.5   Hematocrit 36.0 - 46.0 % 39.5  40.9  33.9   Platelets 150 - 400 K/uL 276  248  203     .    Latest Ref Rng & Units 08/28/2023   12:07 PM 03/01/2023    8:33 AM 01/29/2023   10:44 AM  CMP  Glucose 70 - 99 mg/dL 409  811  914   BUN 8 - 23 mg/dL 15  15  12    Creatinine 0.44 - 1.00 mg/dL 7.82  9.56  2.13   Sodium 135 - 145 mmol/L 141  143  141   Potassium 3.5 - 5.1 mmol/L 3.9  4.9  4.0   Chloride 98 - 111 mmol/L 103  104  105   CO2 22 - 32 mmol/L 31  25  30    Calcium 8.9 - 10.3 mg/dL 40.9  9.3  9.9   Total Protein 6.5 - 8.1 g/dL 7.2  6.1  6.9   Total Bilirubin <1.2 mg/dL 0.6  0.3  0.5   Alkaline Phos 38 - 126 U/L 49  65  52   AST 15 - 41 U/L 17  17  15    ALT 0 - 44 U/L 14  14  15     . Lab Results  Component Value Date   LDH 174 08/28/2023      03/03/2020 FISH (CLL Prognosis)  Panel:     RADIOGRAPHIC STUDIES: I have personally reviewed the radiological images as listed and agreed with the findings in the report.  CT CHEST ABDOMEN PELVIS W CONTRAST  Result Date: 09/19/2021 CLINICAL DATA:  SLL/CLL, assess treatment response immunotherapy EXAM: CT CHEST, ABDOMEN, AND PELVIS WITH CONTRAST TECHNIQUE: Multidetector CT imaging of the chest, abdomen and pelvis was performed following the standard protocol during bolus administration of intravenous contrast. CONTRAST:  80mL OMNIPAQUE IOHEXOL 350 MG/ML SOLN, additional oral enteric contrast COMPARISON:  02/17/2021 FINDINGS: CT CHEST FINDINGS Cardiovascular: Aortic atherosclerosis. Normal heart size. No pericardial effusion. Mediastinum/Nodes: No enlarged mediastinal, hilar, or axillary lymph nodes. Thyroid gland, trachea, and esophagus demonstrate no significant findings. Lungs/Pleura: Stable, benign 0.3 cm nodule of the right upper lobe (series 4, image 51). No pleural effusion or pneumothorax. Musculoskeletal: No chest wall mass or suspicious bone lesions identified. CT ABDOMEN PELVIS FINDINGS Hepatobiliary: No focal liver abnormality is seen. Status post cholecystectomy. No biliary dilatation. Pancreas: Unremarkable. No pancreatic ductal dilatation or surrounding inflammatory changes. Spleen: Normal in size without significant abnormality. Adrenals/Urinary Tract: Adrenal glands are unremarkable. Kidneys are normal, without renal calculi, solid lesion, or hydronephrosis. Bladder is unremarkable. Stomach/Bowel: Stomach is within normal limits. Appendix appears normal. No evidence of bowel wall thickening, distention, or inflammatory changes. Vascular/Lymphatic: No significant vascular findings are present. No enlarged abdominal or pelvic lymph nodes. Reproductive: No mass or other abnormality. Other: No abdominal wall hernia or abnormality. No abdominopelvic ascites. Musculoskeletal: No acute or significant osseous findings. IMPRESSION:  1. No evidence of persistent lymphadenopathy in the chest, abdomen, or pelvis. 2. Normal spleen. 3. Status post cholecystectomy. Aortic Atherosclerosis (ICD10-I70.0). Electronically Signed   By: Jearld Lesch M.D.   On: 09/19/2021 14:53     ASSESSMENT & PLAN:   67 yo with   1) Rai Stage 1 Chronic lymphocytic Leukemia. Found to have trisomy 12 and 13q deletion via 03/03/2020 FISH (CLL Prognosis) Panel  Patient had presented with increasing dyspnea and was found to have CLL involvement of her airways.  Her dyspnea has completely resolved at this time. 2) history of neoplasm of left breast, primary tumor staging category Tis: lobular carcinoma in situ (LCIS) Left breast LCIS status post lumpectomy 10/06/2014; Started tamoxifen for breast cancer risk reduction 20 mg daily 11/18/2014 , decrease to 10 mg on 11/22/2017 completed February 2021.   Breast cancer surveillance:  Continue yearly MMGs with PCP  PLAN: -Discussed lab results from today, 08/28/2023, in detail with the patient. CBC stable. CMP stable. -Recommend influenza vaccine, COVID-19 Booster, RSV vaccine, and other age related vaccines. -Patient has no lab or clinical symptoms suggestive of CLL progression at this time -No indication for additional treatment of CLL at this time -We shall continue active surveillance. -Answered all of patient's  questions.   FOLLOW-UP: RTC with Dr Candise Che with labs in 6 months  The total time spent in the appointment was 21 minutes* .  All of the patient's questions were answered with apparent satisfaction. The patient knows to call the clinic with any problems, questions or concerns.   Wyvonnia Lora MD MS AAHIVMS [facility] Hematology/Oncology Physician [facility]  .*Total Encounter Time as defined by the Centers for Medicare and Medicaid Services includes, in addition to the face-to-face time of a patient visit (documented in the note above) non-face-to-face time: obtaining and  reviewing outside history, ordering and reviewing medications, tests or procedures, care coordination (communications with other health care professionals or caregivers) and documentation in the medical record.   I,Param Shah,acting as a Neurosurgeon for Wyvonnia Lora, MD.,have documented all relevant documentation on the behalf of Wyvonnia Lora, MD,as directed by  Wyvonnia Lora, MD while in the presence of Wyvonnia Lora, MD.  .I have reviewed the above documentation for accuracy and completeness, and I agree with the above. Johney Maine MD

## 2023-08-30 ENCOUNTER — Telehealth: Payer: Self-pay | Admitting: Hematology

## 2023-08-30 NOTE — Telephone Encounter (Signed)
Patient is aware of scheduled appointment times/dates

## 2023-09-10 DIAGNOSIS — M19071 Primary osteoarthritis, right ankle and foot: Secondary | ICD-10-CM | POA: Diagnosis not present

## 2023-09-11 DIAGNOSIS — R7303 Prediabetes: Secondary | ICD-10-CM | POA: Diagnosis not present

## 2023-09-11 DIAGNOSIS — I10 Essential (primary) hypertension: Secondary | ICD-10-CM | POA: Diagnosis not present

## 2023-09-11 DIAGNOSIS — E7800 Pure hypercholesterolemia, unspecified: Secondary | ICD-10-CM | POA: Diagnosis not present

## 2023-09-11 DIAGNOSIS — E559 Vitamin D deficiency, unspecified: Secondary | ICD-10-CM | POA: Diagnosis not present

## 2023-09-13 DIAGNOSIS — I700 Atherosclerosis of aorta: Secondary | ICD-10-CM | POA: Diagnosis not present

## 2023-09-13 DIAGNOSIS — C9110 Chronic lymphocytic leukemia of B-cell type not having achieved remission: Secondary | ICD-10-CM | POA: Diagnosis not present

## 2023-09-13 DIAGNOSIS — E7800 Pure hypercholesterolemia, unspecified: Secondary | ICD-10-CM | POA: Diagnosis not present

## 2023-09-13 DIAGNOSIS — Z0000 Encounter for general adult medical examination without abnormal findings: Secondary | ICD-10-CM | POA: Diagnosis not present

## 2023-09-13 DIAGNOSIS — I10 Essential (primary) hypertension: Secondary | ICD-10-CM | POA: Diagnosis not present

## 2023-10-07 IMAGING — MR MR ANKLE*L* W/O CM
5 series · 36 of 40 positions shown · non-contrast
Comparison: None.

CLINICAL DATA: Chronic foot pain. History of bunionectomy and
neuroma removal.

EXAM:
MRI OF THE LEFT ANKLE WITHOUT CONTRAST; MRI OF THE LEFT FOOT WITHOUT
CONTRAST
TECHNIQUE: Multiplanar, multisequence MR imaging of the ankle was performed. No
intravenous contrast was administered.

[Series 5: T2 fat-sat · axial · 3.0mm · 0.50mm/px · z∈[-85,+36]mm · 8 of 32 slices shown (1 of 2)]
[im 1/32]
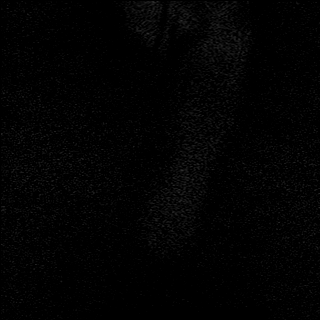
[im 4/32]
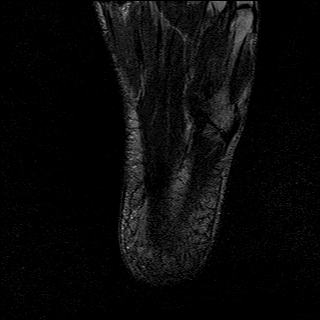
[im 11/32]
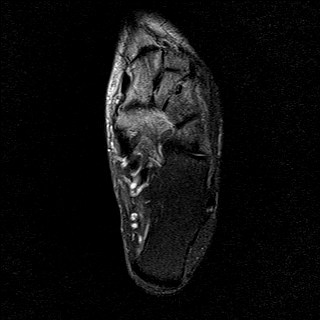
[im 14/32]
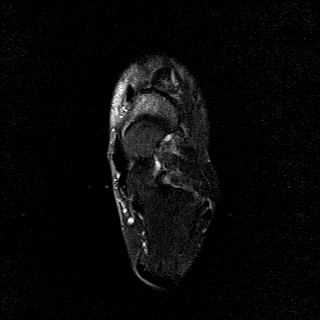
[im 18/32]
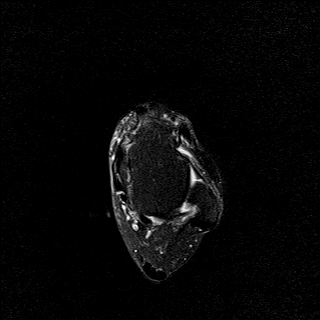
[im 21/32]
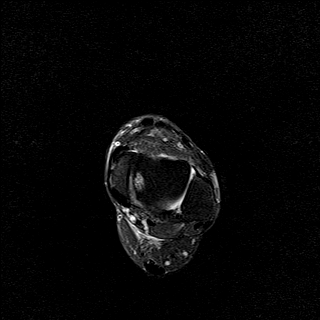
[im 28/32]
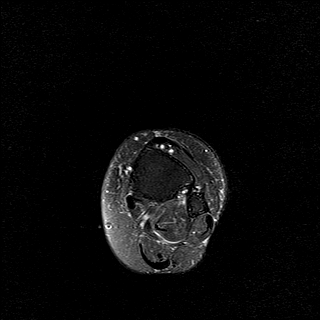
[im 32/32]
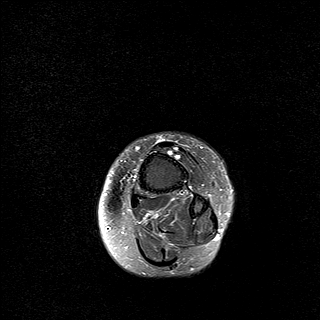

[Series 6: PD fat-sat · axial · 3.0mm · 0.42mm/px · z∈[-85,+36]mm · 9 of 32 slices shown]
[im 1/32]
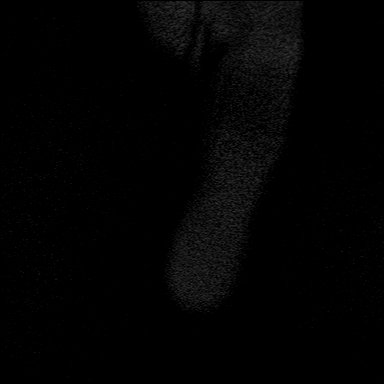
[im 4/32]
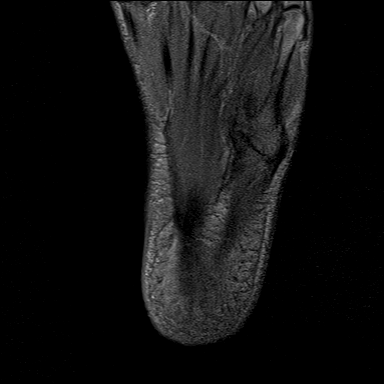
[im 8/32]
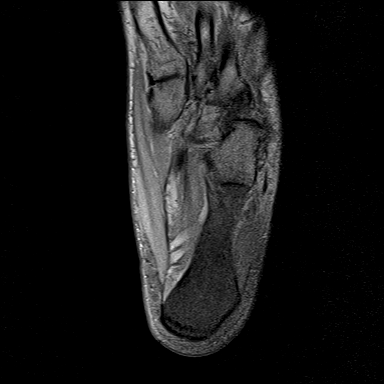
[im 12/32]
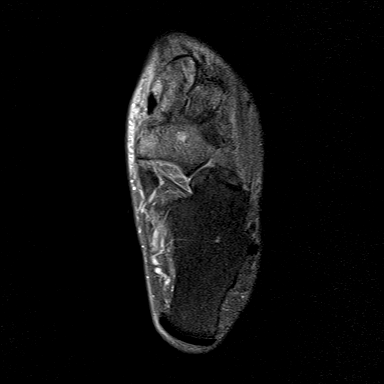
[im 16/32]
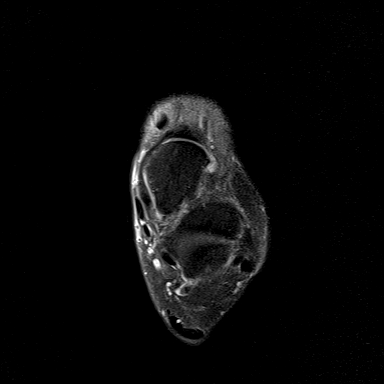
[im 20/32]
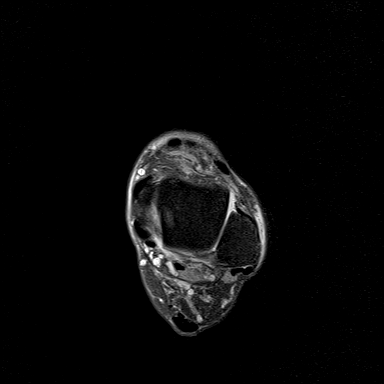
[im 24/32]
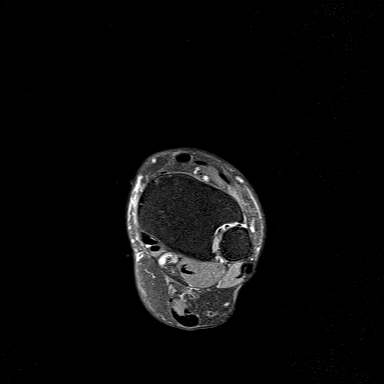
[im 28/32]
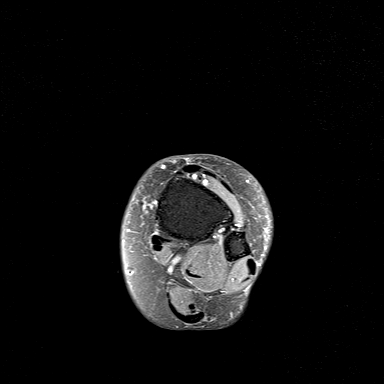
[im 32/32]
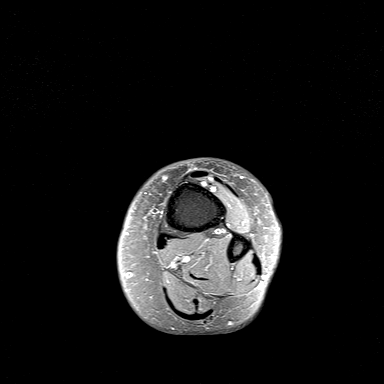

[Series 7: T1 · sagittal · 4.0mm · 0.56mm/px · 6 of 20 slices shown]
[im 1/20]
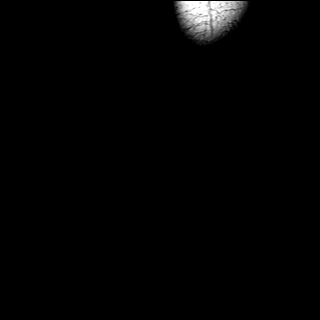
[im 4/20]
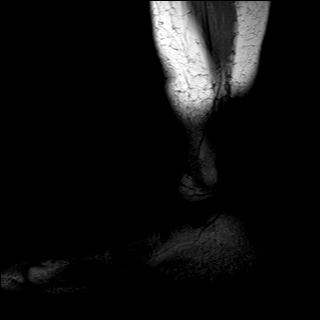
[im 8/20]
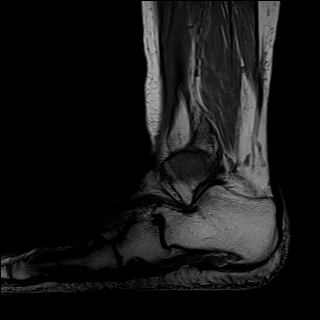
[im 12/20]
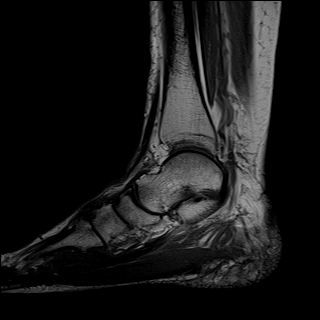
[im 16/20]
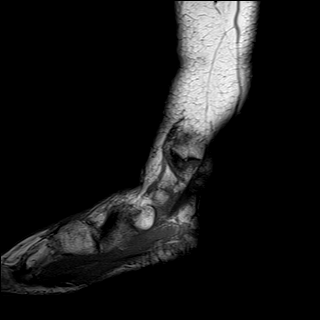
[im 20/20]
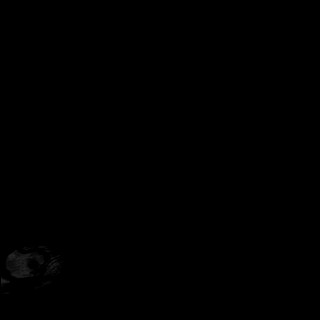

[Series 8: STIR · sagittal · 4.0mm · 0.35mm/px · 4 of 20 slices shown]
[im 1/20]
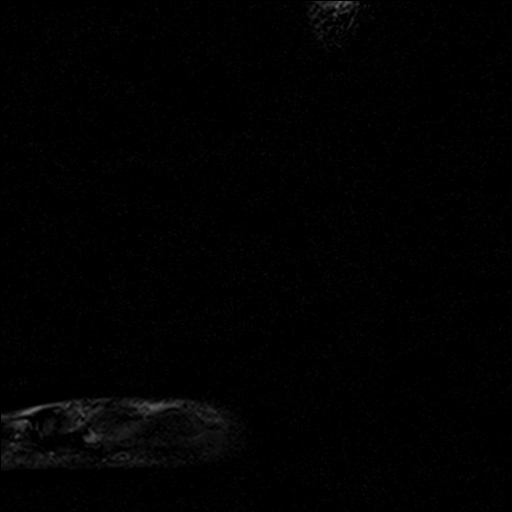
[im 4/20]
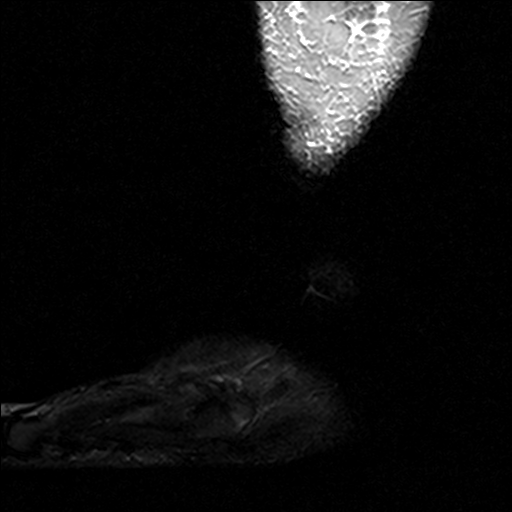
[im 8/20]
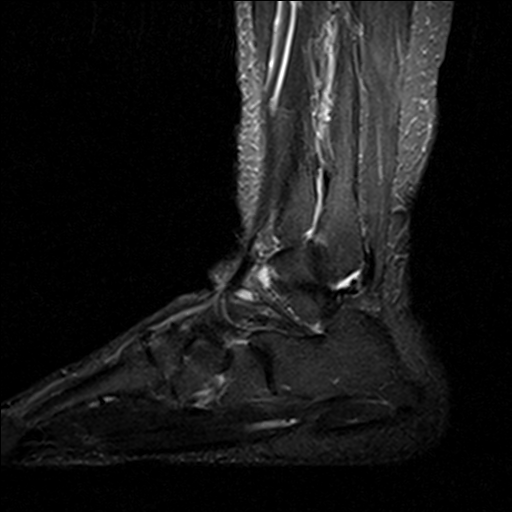
[im 12/20]
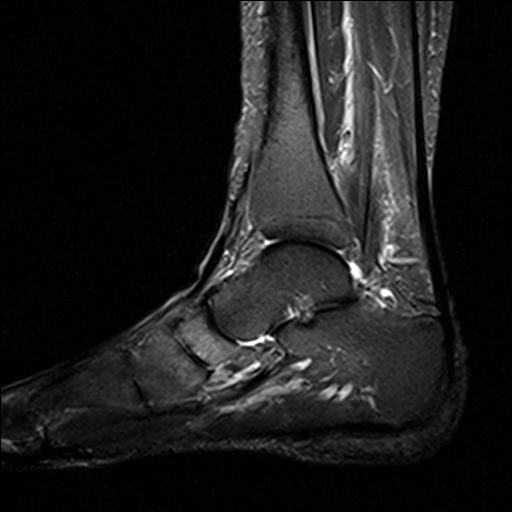

[Series 9: T2 fat-sat · coronal · 3.0mm · 0.50mm/px · 9 of 32 slices shown (2 of 2)]
[im 1/32]
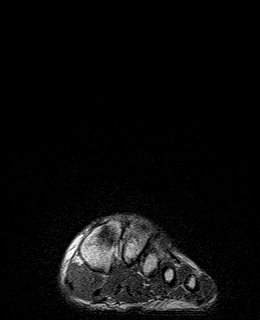
[im 4/32]
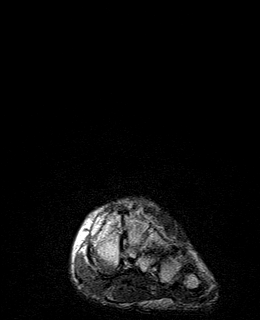
[im 8/32]
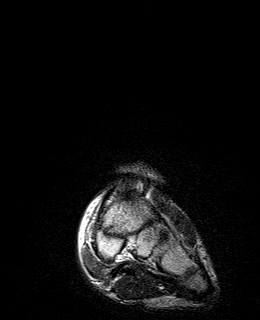
[im 12/32]
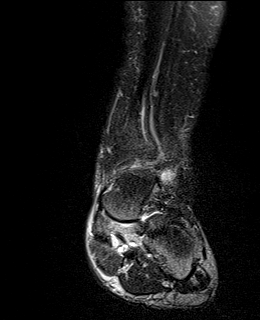
[im 16/32]
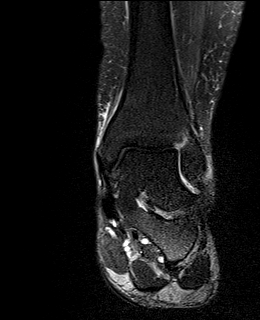
[im 20/32]
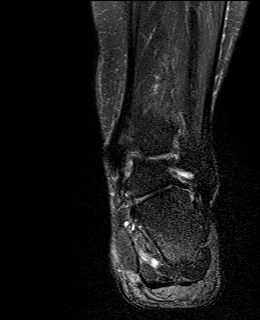
[im 24/32]
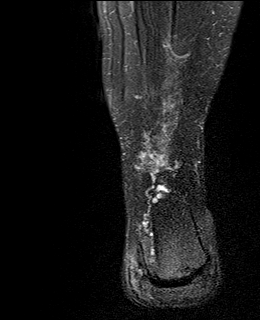
[im 28/32]
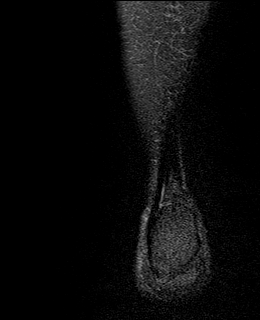
[im 32/32]
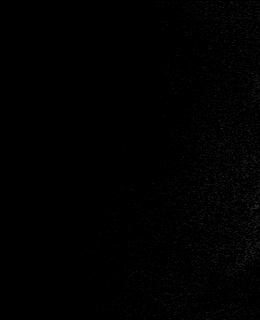

[36 of 40 positions shown; findings below may reference images not displayed]

FINDINGS: TENDONS

Peroneal: Peroneal longus tendon intact. Peroneal brevis intact.

Posteromedial: Posterior tibial tendon intact. Flexor hallucis
longus tendon intact. Flexor digitorum longus tendon intact.

Anterior: Tibialis anterior tendon intact. Extensor hallucis longus
tendon intact Extensor digitorum longus tendon intact.

Achilles:  Intact.

Plantar Fascia: Intact.

LIGAMENTS

Lateral: Anterior talofibular ligament intact. Calcaneofibular
ligament intact. Posterior talofibular ligament intact. Anterior and
posterior tibiofibular ligaments intact.

Medial: Deltoid ligament intact. Spring ligament intact.

CARTILAGE

Ankle Joint: No joint effusion. Normal ankle mortise. 9 x 8 mm
osteochondral lesion involving the medial corner of the talar dome
with partial-thickness overlying cartilage loss and mild subchondral
reactive marrow edema.

Subtalar Joints/Sinus Tarsi: Normal subtalar joints. No subtalar
joint effusion. Normal sinus tarsi.

Bones: Solid arthrodesis across the second PIP joint. Prior hallux
valgus repair. Mild osteoarthritis of the first MTP joint.
Moderate-severe osteoarthritis of the navicular-medial cuneiform
joint with subchondral reactive marrow edema. Small plantar
calcaneal spur.

Soft Tissue: No fluid collection or hematoma. Muscles are normal
without edema or atrophy. Tarsal tunnel is normal.
IMPRESSION: 1. A 9 x 8 mm osteochondral lesion involving the medial corner of
the talar dome with partial-thickness overlying cartilage loss and
mild subchondral reactive marrow edema.
2. Mild osteoarthritis of the first MTP joint.
3. Moderate-severe osteoarthritis of the navicular-medial cuneiform
joint with subchondral reactive marrow edema.

## 2023-11-06 DIAGNOSIS — K080 Exfoliation of teeth due to systemic causes: Secondary | ICD-10-CM | POA: Diagnosis not present

## 2023-11-16 ENCOUNTER — Encounter: Payer: Self-pay | Admitting: Cardiovascular Disease

## 2023-11-16 ENCOUNTER — Ambulatory Visit: Payer: Medicare Other | Attending: Cardiovascular Disease | Admitting: Cardiovascular Disease

## 2023-11-16 VITALS — BP 110/70 | HR 75 | Ht 61.0 in | Wt 143.0 lb

## 2023-11-16 DIAGNOSIS — I10 Essential (primary) hypertension: Secondary | ICD-10-CM | POA: Diagnosis not present

## 2023-11-16 DIAGNOSIS — E785 Hyperlipidemia, unspecified: Secondary | ICD-10-CM

## 2023-11-16 DIAGNOSIS — K219 Gastro-esophageal reflux disease without esophagitis: Secondary | ICD-10-CM

## 2023-11-16 DIAGNOSIS — C9110 Chronic lymphocytic leukemia of B-cell type not having achieved remission: Secondary | ICD-10-CM | POA: Diagnosis not present

## 2023-11-16 DIAGNOSIS — Z8249 Family history of ischemic heart disease and other diseases of the circulatory system: Secondary | ICD-10-CM

## 2023-11-16 NOTE — Patient Instructions (Signed)
 Medication Instructions:  No medication changes were made during today's visit  *If you need a refill on your cardiac medications before your next appointment, please call your pharmacy*   Lab Work: No labs were ordered during today's visit.  If you have labs (blood work) drawn today and your tests are completely normal, you will receive your results only by: MyChart Message (if you have MyChart) OR A paper copy in the mail If you have any lab test that is abnormal or we need to change your treatment, we will call you to review the results.   Testing/Procedures: No procedures ordered today.    Follow-Up: At [facility], you and your health needs are our priority.  As part of our continuing mission to provide you with exceptional heart care, we have created designated Provider Care Teams.  These Care Teams include your primary Cardiologist (physician) and Advanced Practice Providers (APPs -  Physician Assistants and Nurse Practitioners) who all work together to provide you with the care you need, when you need it.  We recommend signing up for the patient portal called MyChart.  Sign up information is provided on this After Visit Summary.  MyChart is used to connect with patients for Virtual Visits (Telemedicine).  Patients are able to view lab/test results, encounter notes, upcoming appointments, etc.  Non-urgent messages can be sent to your provider as well.   To learn more about what you can do with MyChart, go to [email or web address].    Your next appointment:   1 year(s)  Provider:   Dr.Bridgette Lonni   Thank you for choosing [facility]!    A letter will be mailed to you as a reminder to call the office for your follow up appointment.

## 2023-11-16 NOTE — Progress Notes (Signed)
 Patient ID: Colleen Shannon, female   DOB: 1955-10-21, 68 y.o.   MRN: [id number]       HPI: Colleen Shannon is a 68 y.o. female presents to the office today for a 92 month cardiology evaluation.  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  Colleen Shannon has a history of hypertension and previous chest pain for which he was started on carvedilol  and initially nifedipine by Dr. Leafy with a presumptive dose of possible spasm. In August 2013 a nuclear perfusion study was entirely normal. 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 She has been on lipid-lowering therapy with atorvastatin . When I saw her 2 years ago I discontinued her nifedipine and switched her to amlodipine . She states her blood pressure has been controlled on this regimen. An echo Doppler study in August 2013 showed normal systolic function. She did have mild mitral regurgitation.  Over the past year, she has done well from a cardiac standpoint.  She denies any episodes of chest pain.  He denies palpitations.  She is now followed by Dr. Alberta Sharps for primary care.  Recent laboratory had been drawn for which she was told was excellent.  Colleen Shannon underwent right knee replacement surgery by Dr. Willaim on 05/11/2014.  She tolerated that well from a cardiovascular standpoint.  She underwent knee replacement to her left knee in November 2017.  She also was diagnosed as having CLL.  She is followed by Dr. Gudena. .  She continues to take tamoxifen  for previous carcinoma in situ on breast biopsy.   I saw her in January 2019 at which time she had noticed palpitations which may occur 4-5 days per week.  These typically are isolated and last for approximately 20 seconds.  She denies any sustained episodes of tachycardia.  She denies chest pain.  I recommended slight titration of her current date of carvedilol  up to  9.375 mg twice a day.  Her blood pressure was stable and she was advised to continue her current dose of amlodipine .    Follow-up echo Doppler study in February 2019 which showed an EF of 55 to 60% with grade 1 diastolic dysfunction.  There was mild mitral regurgitation.  Pulmonary pressures were normal.    She was seen in April 2019 by Scot Ford, PA and had noted that her baseline heart rate had been increasing to the 80s.  She was drinking 1 cup of coffee per day.  Carvedilol  was further increased to 12.5 mg twice a day.  When I saw her in July 2019 she denied any episodes of chest pain but was concerned since her younger brother had suffered an MI and had required insertion of for coronary stents.  During that evaluation she was inquiring about screening for CAD.   She underwent a CT cardiac scoring study which showed a coronary calcium  score of 0.  Her chest CT did show diffuse bronchial wall thickening.  She states as result of her CLL she does have episodes of bronchitis frequently.  I saw her in November 2019.  Unfortunately her brother died at age 51 and was ultimately found to have a pituitary adenoma with development of Cushing's disease and 12 days following his resection of his adenoma he apparently had a bleed requiring mechanical ventilation and ultimately developed multisystem organ failure leading to his death.  This has been stressful  for Colleen Shannon.  She denied any chest pain.   She has had difficulty with her right knee.  She had previously undergone bilateral knee replacements.  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  She was followed by Dr. Gudino for her left breast cancer originally diagnosed in 2015 and currently on tamoxifen  as well as her CLL with most recent white blood count 39.9 thousand which has been fairly stable.    I  saw her in December 2020.  Since my last evaluation, she is now being followed by Dr. Onesimo for her CLL.  She is felt to have RAI stage I chronic lymphocytic leukemia.  She is now undergoing treatment with Gazyva .  She was evaluated by me on November 01, 2020.  At that time she had lost  weight down to 120 pounds.with her weight loss her blood pressure became low and she ultimately stopped taking amlodipine .  She continued to be on carvedilol  at a reduced dose of 6.25 mg twice a day.  Recently she had noticed some weight gain rest her blood pressure slowly increasing.  She denied any chest tightness or shortness of breath, presyncope, palpitations or syncope.    I saw her on September 21, 2021.  Since her prior evaluation she continued to be stable.   She is status post prior breast Ca for which she was seen by Dr. Gudena for lobular carcinoma in situ of her left breast, and had lumpectomy and completed 5 years of tamoxifen .  More recently she was diagnosed with CLL and is now seen by Dr.Kale.  She  saw him on August 25, 2021.  She was felt to have Rai stage I and in the past had fever and abnormal LFTs.  Recent CT scan of her chest abdomen pelvis was completed which showed no evidence for persistent lymphadenopathy in the chest abdomen or pelvis.  She had a normal spleen.  She was status postcholecystectomy.  Of note there was suggestion of aortic atherosclerosis.  She continues to be pain-free and asymptomatic from a cardiac standpoint.  She is followed by Dr. Alberta Sharps for her primary care.  She had laboratory in October 2022 and LDL 85.  Her younger brother had suffered a myocardial infarction and required stenting.  She underwent cardiac scoring which showed a calcium  score of 0 and remained asymptomatic with reference to chest pain or shortness of breath.  I last saw her on November 08, 2022.  She will be undergoing total right knee revision surgery by Dr. Willaim on 03/2023.  She has had arthritic  issues limiting her exercise and admits to some weight gain.  Her CLL has been in remission.  She has experienced some vague nonexertional pectoral muscle pain which is short-lived.  She also has GERD.    Since I last saw her, she has continued to do well.  She continues to be followed by Dr. Alberta Sharps for primary care.  Laboratory in December 2024 showed total cholesterol 187 triglycerides 87 HDL 112 and LDL 96.  She continues to be on amlodipine  5 mg and carvedilol  6.25 mg twice a day.  She is on atorvastatin  20 mg and Zetia  10 mg for hyperlipidemia.  Remotely she had developed some myalgias on higher dose of statin therapy.  She has GERD for which she takes famotidine .  She remains relatively active and  denies any chest pain or shortness of breath.  Her CLL has been stable in remission followed by Dr. Odean.  She underwent knee surgery successfully.  She is no longer on gabapentin , Robaxin , oxycodone , Xarelto , or tramadol .  Past Medical History:  Diagnosis Date   Arthritis    Breast CA (HCC) dx'd 2015   left   Bronchiectasis (HCC)    Cancer (HCC)    hx of skin cancer    CLL (chronic lymphocytic leukemia) (HCC) dx'd 2015   Dyspnea    Dysrhythmia    PVCs   Elevated cholesterol 05/22/2012   Nuc stress test-Normal.   Endometrial polyp    GERD (gastroesophageal reflux disease)    Grade I diastolic dysfunction    noted on echo   History of palpitations    Hypertension 05/22/12   ECHO-EF >55% trace tricupsid regurgitation. There is mild mitral regurgitation.    MR (mitral regurgitation)    mild noted on echo   Plantar fasciitis of left foot    resolved   Pneumonia    history of x 2   PONV (postoperative nausea and vomiting)     Past Surgical History:  Procedure Laterality Date   ABDOMINAL SURGERY     Abdominoplasty   BRONCHIAL BIOPSY  06/29/2020   Procedure: BRONCHIAL BIOPSIES;  Surgeon: Shelah Lamar RAMAN, MD;  Location: [facility] ENDOSCOPY;  Service: Cardiopulmonary;;   BRONCHIAL  BRUSHINGS  06/29/2020   Procedure: BRONCHIAL BRUSHINGS;  Surgeon: Shelah Lamar RAMAN, MD;  Location[facility] ENDOSCOPY;  Service: Cardiopulmonary;;   BRONCHIAL WASHINGS  06/29/2020   Procedure: BRONCHIAL WASHINGS;  Surgeon: Shelah Lamar RAMAN, MD;  Location: [facility] ENDOSCOPY;  Service: Cardiopulmonary;;   CESAREAN SECTION     X 2   CHOLECYSTECTOMY     COLONSCOPY     DILATION AND CURETTAGE OF UTERUS     FOOT SURGERY Left    HYSTEROSCOPY     KNEE ARTHROTOMY Right 06/04/2019   Procedure: Right knee arthrotomy; scar excision;  Surgeon: Melodi Lerner, MD;  Location: [facility] ORS;  Service: Orthopedics;  Laterality: Right;    KNEE CLOSED REDUCTION Right 07/20/2014   Procedure: RIGHT KNEE CLOSED MANIPULATION;  Surgeon: Lerner Melodi GAILS, MD;  Location: [facility] ORS;  Service: Orthopedics;  Laterality: Right;   KNEE SURGERY Right    arthroscopic   PARTIAL KNEE ARTHROPLASTY Left 2017   RADIOACTIVE SEED GUIDED EXCISIONAL BREAST BIOPSY N/A 10/06/2014   Procedure: RADIOACTIVE SEED GUIDED EXCISIONAL BREAST BIOPSY;  Surgeon: Donnice Bury, MD;  Location: [facility];  Service: General;  Laterality: N/A;   REFRACTIVE SURGERY     TOTAL KNEE ARTHROPLASTY Right 05/11/2014   Procedure: RIGHT TOTAL KNEE ARTHROPLASTY;  Surgeon: Lerner Melodi GAILS, MD;  Location: [facility] ORS;  Service: Orthopedics;  Laterality: Right;   TOTAL KNEE REVISION Right 12/13/2022   Procedure: TOTAL KNEE REVISION;  Surgeon: Melodi Lerner, MD;  Location: [facility] ORS;  Service: Orthopedics;  Laterality: Right;   TUBAL LIGATION     VIDEO BRONCHOSCOPY N/A 06/29/2020   Procedure: VIDEO BRONCHOSCOPY WITHOUT FLUORO;  Surgeon: Shelah Lamar RAMAN, MD;  Location: [facility] ENDOSCOPY;  Service: Cardiopulmonary;  Laterality: N/A;  with BAL   WRIST SURGERY Left     No Known Allergies  Current Outpatient Medications  Medication Sig Dispense Refill   acetaminophen  (TYLENOL ) 500 MG tablet Take 500 mg by mouth every 6 (six) hours as needed for moderate pain.      albuterol  (VENTOLIN  HFA) 108 (90 Base) MCG/ACT inhaler Inhale 1-2 puffs into  the lungs every 6 (six) hours as needed for wheezing or shortness of breath.     amLODipine  (NORVASC ) 5 MG tablet Take 1 tablet (5 mg total) by mouth at bedtime. 90 tablet 1   atorvastatin  (LIPITOR) 20 MG tablet Take 1 tablet (20 mg total) by mouth at bedtime. 90 tablet 2   carvedilol  (COREG ) 12.5 MG tablet Take 0.5 tablets (6.25 mg total) by mouth 2 (two) times daily with a meal. 90 tablet 2   ezetimibe  (ZETIA ) 10 MG tablet Take 1 tablet (10 mg total) by mouth at bedtime. 90 tablet 1   famotidine  (PEPCID ) 40 MG tablet Take 40 mg by mouth 2 (two) times daily.     gabapentin  (NEURONTIN ) 300 MG capsule Take a 300 mg capsule three times a day for two weeks following surgery.Then take a 300 mg capsule two times a day for two weeks. Then take a 300 mg capsule once a day for two weeks. Then discontinue. 84 capsule 0   methocarbamol  (ROBAXIN ) 500 MG tablet Take 1 tablet (500 mg total) by mouth every 6 (six) hours as needed for muscle spasms. 42 tablet 0   oxyCODONE  (OXY IR/ROXICODONE ) 5 MG immediate release tablet Take 1-2 tablets (5-10 mg total) by mouth every 6 (six) hours as needed for severe pain. 42 tablet 0   rivaroxaban  (XARELTO ) 10 MG TABS tablet Take 1 tablet (10 mg total) by mouth daily with breakfast. 20 tablet 0   traMADol  (ULTRAM ) 50 MG tablet Take 1-2 tablets (50-100 mg total) by mouth every 6 (six) hours as needed for moderate pain. 42 tablet 0   No current facility-administered medications for this visit.    Social History   Socioeconomic History   Marital status: Married    Spouse name: Not on file   Number of children: Not on file   Years of education: Not on file   Highest education level: Not on file  Occupational History   Not on file  Tobacco Use   Smoking status: Never   Smokeless tobacco: Never  Vaping Use   Vaping status: Never Used  Substance and Sexual Activity   Alcohol use: Yes     Comment: occasional    Drug use: No   Sexual activity: Yes    Birth control/protection: Surgical, Post-menopausal    Comment: Hysterectomy  Other Topics Concern   Not on file  Social History Narrative   Not on file   Social Drivers of Health   Financial Resource Strain: Not on file  Food Insecurity: No Food Insecurity (12/13/2022)   Hunger Vital Sign    Worried About Running Out of Food in the Last Year: Never true    Ran Out of Food in the Last Year: Never true  Transportation Needs: No Transportation Needs (12/13/2022)   PRAPARE - Administrator, Civil Service (Medical): No    Lack of Transportation (Non-Medical): No  Physical Activity: Not on file  Stress: Not on file  Social Connections: Unknown (02/21/2022)   [facility]   Social Network    Social Network: Not on file  Intimate Partner Violence: Not At Risk (12/13/2022)   Humiliation, Afraid, Rape, and Kick questionnaire    Fear of Current or Ex-Partner: No    Emotionally Abused: No    Physically Abused: No    Sexually Abused: No   Additional social history is notable that she is married for 43 years. 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 There is no tobacco use. He does drink occasional wine. .  She is status post bilateral knee replacements.   Family History  Problem Relation Age of Onset   Heart disease Father    Dementia Father    Aneurysm Mother    Hyperlipidemia Mother    Alzheimer's disease Mother    Osteoarthritis Mother    Hypertension Brother    High Cholesterol Brother    Cancer - Other Paternal Grandmother        uterine   Hypertension Paternal Grandfather    Stroke Paternal Grandfather    Heart disease Maternal Grandfather    Cancer - Other Maternal Grandfather        lung   Cushing syndrome Brother    Healthy Son    Healthy Daughter    ROS General: Negative; No fevers, chills, or night sweats;   HEENT: Negative; No changes in vision or hearing, sinus congestion, difficulty swallowing Pulmonary: Negative; No cough, wheezing, shortness of breath, hemoptysis Cardiovascular: Negative; No chest pain, presyncope, syncope, palpitations GI: Negative; No nausea, vomiting, diarrhea, or abdominal pain GU: Negative; No dysuria, hematuria, or difficulty voiding Musculoskeletal: Positive for bilateral knee replacement surgery;positive for osteoarthritis no myalgias,  Hematologic/Oncology: positive for CLL, history of breast cancer Endocrine: Negative; no heat/cold intolerance; no diabetes Neuro: Negative; no changes in balance, headaches Skin: Negative; No rashes or skin lesions Psychiatric: Negative; No behavioral problems, depression Sleep: Negative; No snoring, daytime sleepiness, hypersomnolence, bruxism, restless legs, hypnogognic hallucinations, no cataplexy Other comprehensive 14 point system review is negative.  PE BP 110/70 (BP Location: Left Arm, Patient Position: Sitting)   Pulse 75   Ht 5' 1 (1.549 m)   Wt 143 lb (64.9 kg)   LMP 11/19/2011 (LMP Unknown)   BMI 27.02 kg/m    Repeat blood pressure was 120/70  Wt Readings from Last 3 Encounters:  11/16/23 143 lb (64.9 kg)  08/28/23 141 lb (64 kg)  01/29/23 146 lb (66.2 kg)   General: Alert, oriented, no distress.  Skin: normal turgor, no rashes, warm and dry HEENT: Normocephalic, atraumatic. Pupils equal round and reactive to light; sclera anicteric; extraocular muscles intact; Nose without nasal septal hypertrophy Mouth/Parynx benign; Mallinpatti scale 3 Neck: No JVD, no carotid bruits; normal carotid upstroke Lungs: clear to ausculatation and percussion; no wheezing or rales Chest wall: without tenderness to palpitation Heart: PMI not displaced, RRR, s1 s2 normal, 1/6 systolic murmur, no diastolic murmur, no rubs, gallops, thrills, or heaves Abdomen: soft, nontender; no hepatosplenomehaly, BS+; abdominal aorta nontender  and not dilated by palpation. Back: no CVA tenderness Pulses 2+ Musculoskeletal: full range of motion, normal strength, no joint deformities Extremities: no clubbing cyanosis or edema, Homan's sign negative  Neurologic: grossly nonfocal; Cranial nerves grossly wnl Psychologic: Normal mood and affect   EKG Interpretation Date/Time:  Friday November 16 2023 11:51:36 EST Ventricular Rate:  75 PR Interval:  122 QRS Duration:  78 QT Interval:  376 QTC Calculation: 419 R Axis:   -1  Text Interpretation: Normal sinus rhythm Low voltage QRS Septal infarct , age undetermined When compared with ECG of 17-Jul-2014 10:23, No significant change was found Confirmed by Burnard Ned ([id number]) on 11/16/2023 1:39:32 PM    November 08, 2022 ECG (independently read by me):  NSR at 73, no ectopy  September 21, 2021 ECG (independently read by me): NSR at 69, no ectopy, normal intervals  November 01, 2020 ECG (independently read by me):  NSR at 67  December 2020 ECG (independently read by me): Normal sinus rhythm at 77 bpm.  No ST segment changes.  No ectopy.  Normal intervals.  November 2019 ECG (independently read by me): Normal sinus rhythm at 69 bpm.  No ectopy.  Normal intervals.  July 2019 ECG (independently read by me): Normal sinus rhythm at 64 bpm.  No ectopy.  Normal intervals.  No ST segment changes.  January 2019 ECG (independently read by me): normal sinus rhythm at 76 bpm.  Normal intervals.  No ectopy or ST changes  November 2016 ECG (independently read by me) from 07/17/2014: Normal sinus rhythm.  No ectopy.  Normal intervals.  No ST segment changes.  August 2014 ECG: Normal sinus rhythm at 75 beats per minute without ectopy. Normal intervals.  LABS:    Latest Ref Rng & Units 08/28/2023   12:07 PM 03/01/2023    8:33 AM 01/29/2023   10:44 AM  BMP  Glucose 70 - 99 mg/dL 897  878  876   BUN 8 - 23 mg/dL 15  15  12    Creatinine 0.44 - 1.00 mg/dL 9.32  9.39  9.31   BUN/Creat Ratio 12 - 28   25    Sodium 135 - 145 mmol/L 141  143  141   Potassium 3.5 - 5.1 mmol/L 3.9  4.9  4.0   Chloride 98 - 111 mmol/L 103  104  105   CO2 22 - 32 mmol/L 31  25  30    Calcium  8.9 - 10.3 mg/dL 89.8  9.3  9.9       Latest Ref Rng & Units 08/28/2023   12:07 PM 03/01/2023    8:33 AM 01/29/2023   10:44 AM  Hepatic Function  Total Protein 6.5 - 8.1 g/dL 7.2  6.1  6.9   Albumin  3.5 - 5.0 g/dL 4.7  4.5  4.7   AST 15 - 41 U/L 17  17  15    ALT 0 - 44 U/L 14  14  15    Alk Phosphatase 38 - 126 U/L 49  65  52   Total Bilirubin <1.2 mg/dL 0.6  0.3  0.5       Latest Ref Rng & Units 08/28/2023   12:07 PM 01/29/2023   10:44 AM 12/14/2022    3:27 AM  CBC  WBC 4.0 - 10.5 K/uL 5.7  5.0  10.7   Hemoglobin 12.0 - 15.0 g/dL 86.8  86.5  88.7   Hematocrit 36.0 - 46.0 % 39.5  40.9  33.9   Platelets 150 - 400 K/uL 276  248  203    Lab Results  Component Value Date   MCV 88.8 08/28/2023   MCV 90.1 01/29/2023   MCV 92.1 12/14/2022   Lab Results  Component Value Date   TSH 1.860 01/30/2018   Lipid Panel     Component Value Date/Time   CHOL 165 03/01/2023 0833   TRIG 81 03/01/2023 0833   HDL 73 03/01/2023 0833   CHOLHDL 2.3 03/01/2023 0833   CHOLHDL 2.3 08/19/2015 0857   VLDL 17 08/19/2015 0857   LDLCALC 77 03/01/2023 0833    RADIOLOGY: No results found.  IMPRESSION:  1. Primary hypertension   2. Hyperlipidemia with target LDL less than 70   3. CLL (chronic lymphocytic leukemia) (HCC)   4. Gastroesophageal reflux disease without esophagitis   5. 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  In August 2013 a nuclear perfusion study was normal.   When I saw her in January 2019, her blood pressure was mildly elevated based on recent change in guidelines and she was experiencing some episodic palpitations.  Her  palpitations improved with slight titration of carvedilol  and when seen several months later her dose was further titrated to 12.5 mg twice a day.   She had lost significant weight and discontinued amlodipine  and her carvedilol  dose has been reduced but at a subsequent evaluation blood pressure  had further increased and amlodipine  was resumed.    She is now on amlodipine  5 mg in addition to carvedilol  6.25 mg twice a day.  Her blood pressure today is excellent and on repeat by me was 120/70.  She had previously undergone calcium  scoring after her younger brother had suffered a myocardial infarction and required stenting.  Calcium  score was 0.  She is followed by Dr. Alberta Sharps at [place].  Laboratory in September 07, 2022 showed total cholesterol 202, HDL 96, LDL 87, triglycerides 112.  Hemoglobin A1c was 5.3.  Creatinine was 0.62.  LFTs were normal.  Presently, she continues to be on atorvastatin  20 mg and Zetia  10 mg.  Remotely on higher dose of statin she did experience some myalgias.  She is followed by Dr. Alberta Sharps and I had suggested that her laboratory be obtained that she also obtain LP(a) and I previously had discussed with her that this is an independent risk factor that is genetically driven and if elevated I would aim for target LDL less than 50.  Since I last saw her, she underwent successful knee surgery.  Her CLL continues to be in remission.  I discussed with her my plans for future retirement at the end of June of this year.  Clinically she remained stable.  I have suggested transition of care for her to be seen by Dr. Dawna Bruckner in 1 year for follow-up evaluation at our [place] office.  It has been a pleasure to take care of her over these many years.  If problems arise before the end of June I will be happy to see her for follow-up evaluation if necessary.   Debby RONAL Sor, MD, FACC  11/17/2023 2:25 PM

## 2023-11-17 ENCOUNTER — Encounter: Payer: Self-pay | Admitting: Cardiovascular Disease

## 2023-11-20 ENCOUNTER — Encounter: Payer: Self-pay | Admitting: Cardiovascular Disease

## 2023-11-27 DIAGNOSIS — Z1231 Encounter for screening mammogram for malignant neoplasm of breast: Secondary | ICD-10-CM | POA: Diagnosis not present

## 2023-11-29 DIAGNOSIS — M8588 Other specified disorders of bone density and structure, other site: Secondary | ICD-10-CM | POA: Diagnosis not present

## 2024-01-29 DIAGNOSIS — H43813 Vitreous degeneration, bilateral: Secondary | ICD-10-CM | POA: Diagnosis not present

## 2024-01-29 DIAGNOSIS — H18413 Arcus senilis, bilateral: Secondary | ICD-10-CM | POA: Diagnosis not present

## 2024-01-29 DIAGNOSIS — Z961 Presence of intraocular lens: Secondary | ICD-10-CM | POA: Diagnosis not present

## 2024-02-06 DIAGNOSIS — M19071 Primary osteoarthritis, right ankle and foot: Secondary | ICD-10-CM | POA: Diagnosis not present

## 2024-02-26 ENCOUNTER — Other Ambulatory Visit: Payer: Self-pay | Admitting: *Deleted

## 2024-02-26 DIAGNOSIS — C9110 Chronic lymphocytic leukemia of B-cell type not having achieved remission: Secondary | ICD-10-CM

## 2024-02-27 ENCOUNTER — Inpatient Hospital Stay: Payer: Medicare Other | Attending: Hematology

## 2024-02-27 ENCOUNTER — Inpatient Hospital Stay: Payer: Medicare Other | Admitting: Hematology

## 2024-02-27 VITALS — BP 142/61 | HR 75 | Temp 97.9°F | Resp 20 | Wt 145.7 lb

## 2024-02-27 DIAGNOSIS — Z853 Personal history of malignant neoplasm of breast: Secondary | ICD-10-CM | POA: Insufficient documentation

## 2024-02-27 DIAGNOSIS — Z801 Family history of malignant neoplasm of trachea, bronchus and lung: Secondary | ICD-10-CM | POA: Diagnosis not present

## 2024-02-27 DIAGNOSIS — Z8049 Family history of malignant neoplasm of other genital organs: Secondary | ICD-10-CM | POA: Insufficient documentation

## 2024-02-27 DIAGNOSIS — C9110 Chronic lymphocytic leukemia of B-cell type not having achieved remission: Secondary | ICD-10-CM | POA: Diagnosis not present

## 2024-02-27 LAB — CMP (CANCER CENTER ONLY)
ALT: 18 U/L (ref 0–44)
AST: 18 U/L (ref 15–41)
Albumin: 4.6 g/dL (ref 3.5–5.0)
Alkaline Phosphatase: 45 U/L (ref 38–126)
Anion gap: 5 (ref 5–15)
BUN: 16 mg/dL (ref 8–23)
CO2: 30 mmol/L (ref 22–32)
Calcium: 9.2 mg/dL (ref 8.9–10.3)
Chloride: 105 mmol/L (ref 98–111)
Creatinine: 0.64 mg/dL (ref 0.44–1.00)
GFR, Estimated: >60 mL/min (ref >60)
Glucose, Bld: 99 mg/dL (ref 70–99)
Potassium: 4.5 mmol/L (ref 3.5–5.1)
Sodium: 140 mmol/L (ref 135–145)
Total Bilirubin: 0.6 mg/dL (ref 0.0–1.2)
Total Protein: 6.8 g/dL (ref 6.5–8.1)

## 2024-02-27 LAB — CBC WITH DIFFERENTIAL (CANCER CENTER ONLY)
Abs Immature Granulocytes: 0.01 10*3/uL (ref 0.00–0.07)
Basophils Absolute: 0 10*3/uL (ref 0.0–0.1)
Basophils Relative: 1 %
Eosinophils Absolute: 0.2 10*3/uL (ref 0.0–0.5)
Eosinophils Relative: 3 %
HCT: 39 % (ref 36.0–46.0)
Hemoglobin: 13.5 g/dL (ref 12.0–15.0)
Immature Granulocytes: 0 %
Lymphocytes Relative: 23 %
Lymphs Abs: 1.1 10*3/uL (ref 0.7–4.0)
MCH: 30.3 pg (ref 26.0–34.0)
MCHC: 34.6 g/dL (ref 30.0–36.0)
MCV: 87.4 fL (ref 80.0–100.0)
Monocytes Absolute: 0.3 10*3/uL (ref 0.1–1.0)
Monocytes Relative: 7 %
Neutro Abs: 3.2 10*3/uL (ref 1.7–7.7)
Neutrophils Relative %: 66 %
Platelet Count: 241 10*3/uL (ref 150–400)
RBC: 4.46 MIL/uL (ref 3.87–5.11)
RDW: 13.2 % (ref 11.5–15.5)
WBC Count: 4.8 10*3/uL (ref 4.0–10.5)
nRBC: 0 % (ref 0.0–0.2)

## 2024-02-27 LAB — LACTATE DEHYDROGENASE: LDH: 162 U/L (ref 98–192)

## 2024-02-27 NOTE — Progress Notes (Signed)
 HEMATOLOGY/ONCOLOGY CLINIC NOTE  Date of Service: 02/27/24   Patient Care Team: Faustina Hood, MD as PCP - General (Family Medicine) Millicent Ally, MD as PCP - Cardiology (Cardiology)  CHIEF COMPLAINTS/PURPOSE OF CONSULTATION:  For continued evaluation and management of CLL  HISTORY OF PRESENTING ILLNESS:  Please see previous note for details on initial presentation  INTERVAL HISTORY:  [name] is a 68 y.o. female here for continued evaluation and management of her CLL.   Patient was last seen by me on 08/28/2023 and complained of right ankle pain and two incidents of infection with productive cough lasting 1-2 months.   She reports that she has been having chronic frequent vertigo for 20 years. She notes that she has been trying maneuver exercises for 2.5 months which has not been effective.  Her vertigo is present with certain positions including bending over and turning to her left when lying down. Patient reports that her nystagmus symptoms generally do not last longer than 10-12 seconds. Patient denies any concern for upper respiratory infection, new headaches, changes in hearing, local ear pain, or ear infections.   Patient reports that she sometimes has mild difficulty taking a deep breath, mostly when lying down. She denies any concern for allergies. She takes Famotidine  40 MG for acid reflux.   Patient reports that she is UTD with her age-appropriate cancer-screenings, including mammogram and colonoscopies.   She notes that she has gained some weight. Her weight in clinic today is noted to be 145 pounds.   Patient reports that there are plans for foot surgery in the fall.   She denies any other major medication changes.   MEDICAL HISTORY:  Past Medical History:  Diagnosis Date   Arthritis    Breast CA (HCC) dx'd 2015   left   Bronchiectasis (HCC)    Cancer (HCC)    hx of skin cancer    CLL (chronic lymphocytic leukemia) (HCC) dx'd 2015   Dyspnea     Dysrhythmia    PVCs   Elevated cholesterol 05/22/2012   Nuc stress test-Normal.   Endometrial polyp    GERD (gastroesophageal reflux disease)    Grade I diastolic dysfunction    noted on echo   History of palpitations    Hypertension 05/22/12   ECHO-EF >55% trace tricupsid regurgitation. There is mild mitral regurgitation.    MR (mitral regurgitation)    mild noted on echo   Plantar fasciitis of left foot    resolved   Pneumonia    history of x 2   PONV (postoperative nausea and vomiting)     SURGICAL HISTORY: Past Surgical History:  Procedure Laterality Date   ABDOMINAL SURGERY     Abdominoplasty   BRONCHIAL BIOPSY  06/29/2020   Procedure: BRONCHIAL BIOPSIES;  Surgeon: Denson Flake, MD;  Location: [facility] ENDOSCOPY;  Service: Cardiopulmonary;;   BRONCHIAL BRUSHINGS  06/29/2020   Procedure: BRONCHIAL BRUSHINGS;  Surgeon: Denson Flake, MD;  Location: [facility] ENDOSCOPY;  Service: Cardiopulmonary;;   BRONCHIAL WASHINGS  06/29/2020   Procedure: BRONCHIAL WASHINGS;  Surgeon: Denson Flake, MD;  Location: [facility] ENDOSCOPY;  Service: Cardiopulmonary;;   CESAREAN SECTION     X 2   CHOLECYSTECTOMY     COLONSCOPY     DILATION AND CURETTAGE OF UTERUS     FOOT SURGERY Left    HYSTEROSCOPY     KNEE ARTHROTOMY Right 06/04/2019   Procedure: Right knee arthrotomy; scar excision;  Surgeon: Liliane Rei, MD;  Location:  [facility] ORS;  Service: Orthopedics;  Laterality: Right;    KNEE CLOSED REDUCTION Right 07/20/2014   Procedure: RIGHT KNEE CLOSED MANIPULATION;  Surgeon: Aurther Blue, MD;  Location: [facility] ORS;  Service: Orthopedics;  Laterality: Right;   KNEE SURGERY Right    arthroscopic   PARTIAL KNEE ARTHROPLASTY Left 2017   RADIOACTIVE SEED GUIDED EXCISIONAL BREAST BIOPSY N/A 10/06/2014   Procedure: RADIOACTIVE SEED GUIDED EXCISIONAL BREAST BIOPSY;  Surgeon: Enid Harry, MD;  Location: [facility];  Service: General;  Laterality: N/A;   REFRACTIVE SURGERY     TOTAL  KNEE ARTHROPLASTY Right 05/11/2014   Procedure: RIGHT TOTAL KNEE ARTHROPLASTY;  Surgeon: Aurther Blue, MD;  Location: [facility]S;  Service: Orthopedics;  Laterality: Right;   TOTAL KNEE REVISION Right 12/13/2022   Procedure: TOTAL KNEE REVISION;  Surgeon: Liliane Rei, MD;  Location: [facility];  Service: Orthopedics;  Laterality: Right;   TUBAL LIGATION     VIDEO BRONCHOSCOPY N/A 06/29/2020   Procedure: VIDEO BRONCHOSCOPY WITHOUT FLUORO;  Surgeon: Denson Flake, MD;  Location: [facility] ENDOSCOPY;  Service: Cardiopulmonary;  Laterality: N/A;  with BAL   WRIST SURGERY Left     SOCIAL HISTORY: Social History   Socioeconomic History   Marital status: Married    Spouse name: Not on file   Number of children: Not on file   Years of education: Not on file   Highest education level: Not on file  Occupational History   Not on file  Tobacco Use   Smoking status: Never   Smokeless tobacco: Never  Vaping Use   Vaping status: Never Used  Substance and Sexual Activity   Alcohol use: Yes    Comment: occasional    Drug use: No   Sexual activity: Yes    Birth control/protection: Surgical, Post-menopausal    Comment: Hysterectomy  Other Topics Concern   Not on file  Social History Narrative   Not on file   Social Drivers of Health   Financial Resource Strain: Not on file  Food Insecurity: No Food Insecurity (12/13/2022)   Hunger Vital Sign    Worried About Running Out of Food in the Last Year: Never true    Ran Out of Food in the Last Year: Never true  Transportation Needs: No Transportation Needs (12/13/2022)   PRAPARE - Administrator, Civil Service (Medical): No    Lack of Transportation (Non-Medical): No  Physical Activity: Not on file  Stress: Not on file  Social Connections: Unknown (02/21/2022)   [facility]   Social Network    Social Network: Not on file  Intimate Partner Violence: Not At Risk (12/13/2022)   Humiliation, Afraid, Rape, and Kick  questionnaire    Fear of Current or Ex-Partner: No    Emotionally Abused: No    Physically Abused: No    Sexually Abused: No    FAMILY HISTORY: Family History  Problem Relation Age of Onset   Heart disease Father    Dementia Father    Aneurysm Mother    Hyperlipidemia Mother    Alzheimer's disease Mother    Osteoarthritis Mother    Hypertension Brother    High Cholesterol Brother    Cancer - Other Paternal Grandmother        uterine   Hypertension Paternal Grandfather    Stroke Paternal Grandfather    Heart disease Maternal Grandfather    Cancer - Other Maternal Grandfather  lung   Cushing syndrome Brother    Healthy Son    Healthy Daughter     ALLERGIES:  has no known allergies.  MEDICATIONS:  Current Outpatient Medications  Medication Sig Dispense Refill   acetaminophen  (TYLENOL ) 500 MG tablet Take 500 mg by mouth every 6 (six) hours as needed for moderate pain.     albuterol  (VENTOLIN  HFA) 108 (90 Base) MCG/ACT inhaler Inhale 1-2 puffs into the lungs every 6 (six) hours as needed for wheezing or shortness of breath.     amLODipine  (NORVASC ) 5 MG tablet Take 1 tablet (5 mg total) by mouth at bedtime. 90 tablet 1   atorvastatin  (LIPITOR) 20 MG tablet Take 1 tablet (20 mg total) by mouth at bedtime. 90 tablet 2   carvedilol  (COREG ) 12.5 MG tablet Take 0.5 tablets (6.25 mg total) by mouth 2 (two) times daily with a meal. 90 tablet 2   ezetimibe  (ZETIA ) 10 MG tablet Take 1 tablet (10 mg total) by mouth at bedtime. 90 tablet 1   famotidine  (PEPCID ) 40 MG tablet Take 40 mg by mouth 2 (two) times daily.     No current facility-administered medications for this visit.    REVIEW OF SYSTEMS:   10 Point review of Systems was done is negative except as noted above.  PHYSICAL EXAMINATION:  .LMP 11/19/2011 (LMP Unknown)    GENERAL:alert, in no acute distress and comfortable SKIN: no acute rashes, no significant lesions EYES: conjunctiva are pink and non-injected,  sclera anicteric OROPHARYNX: MMM, no exudates, no oropharyngeal erythema or ulceration NECK: supple, no JVD LYMPH:  no palpable lymphadenopathy in the cervical, axillary or inguinal regions LUNGS: clear to auscultation b/l with normal respiratory effort HEART: regular rate & rhythm ABDOMEN:  normoactive bowel sounds , non tender, not distended. Extremity: no pedal edema PSYCH: alert & oriented x 3 with fluent speech NEURO: no focal motor/sensory deficits    LABORATORY DATA:    .    Latest Ref Rng & Units 08/28/2023   12:07 PM 01/29/2023   10:44 AM 12/14/2022    3:27 AM  CBC  WBC 4.0 - 10.5 K/uL 5.7  5.0  10.7   Hemoglobin 12.0 - 15.0 g/dL 21.3  08.6  57.8   Hematocrit 36.0 - 46.0 % 39.5  40.9  33.9   Platelets 150 - 400 K/uL 276  248  203     .    Latest Ref Rng & Units 08/28/2023   12:07 PM 03/01/2023    8:33 AM 01/29/2023   10:44 AM  CMP  Glucose 70 - 99 mg/dL 469  629  528   BUN 8 - 23 mg/dL 15  15  12    Creatinine 0.44 - 1.00 mg/dL 4.13  2.44  0.10   Sodium 135 - 145 mmol/L 141  143  141   Potassium 3.5 - 5.1 mmol/L 3.9  4.9  4.0   Chloride 98 - 111 mmol/L 103  104  105   CO2 22 - 32 mmol/L 31  25  30    Calcium  8.9 - 10.3 mg/dL 27.2  9.3  9.9   Total Protein 6.5 - 8.1 g/dL 7.2  6.1  6.9   Total Bilirubin <1.2 mg/dL 0.6  0.3  0.5   Alkaline Phos 38 - 126 U/L 49  65  52   AST 15 - 41 U/L 17  17  15    ALT 0 - 44 U/L 14  14  15     . Lab Results  Component Value Date   LDH 174 08/28/2023      03/03/2020 FISH (CLL Prognosis) Panel:     RADIOGRAPHIC STUDIES: I have personally reviewed the radiological images as listed and agreed with the findings in the report.  CT CHEST ABDOMEN PELVIS W CONTRAST  Result Date: 09/19/2021 CLINICAL DATA:  SLL/CLL, assess treatment response immunotherapy EXAM: CT CHEST, ABDOMEN, AND PELVIS WITH CONTRAST TECHNIQUE: Multidetector CT imaging of the chest, abdomen and pelvis was performed following the standard protocol during  bolus administration of intravenous contrast. CONTRAST:  80mL OMNIPAQUE  IOHEXOL  350 MG/ML SOLN, additional oral enteric contrast COMPARISON:  02/17/2021 FINDINGS: CT CHEST FINDINGS Cardiovascular: Aortic atherosclerosis. Normal heart size. No pericardial effusion. Mediastinum/Nodes: No enlarged mediastinal, hilar, or axillary lymph nodes. Thyroid  gland, trachea, and esophagus demonstrate no significant findings. Lungs/Pleura: Stable, benign 0.3 cm nodule of the right upper lobe (series 4, image 51). No pleural effusion or pneumothorax. Musculoskeletal: No chest wall mass or suspicious bone lesions identified. CT ABDOMEN PELVIS FINDINGS Hepatobiliary: No focal liver abnormality is seen. Status post cholecystectomy. No biliary dilatation. Pancreas: Unremarkable. No pancreatic ductal dilatation or surrounding inflammatory changes. Spleen: Normal in size without significant abnormality. Adrenals/Urinary Tract: Adrenal glands are unremarkable. Kidneys are normal, without renal calculi, solid lesion, or hydronephrosis. Bladder is unremarkable. Stomach/Bowel: Stomach is within normal limits. Appendix appears normal. No evidence of bowel wall thickening, distention, or inflammatory changes. Vascular/Lymphatic: No significant vascular findings are present. No enlarged abdominal or pelvic lymph nodes. Reproductive: No mass or other abnormality. Other: No abdominal wall hernia or abnormality. No abdominopelvic ascites. Musculoskeletal: No acute or significant osseous findings. IMPRESSION: 1. No evidence of persistent lymphadenopathy in the chest, abdomen, or pelvis. 2. Normal spleen. 3. Status post cholecystectomy. Aortic Atherosclerosis (ICD10-I70.0). Electronically Signed   By: Fredricka Jenny M.D.   On: 09/19/2021 14:53     ASSESSMENT & PLAN:   68 yo female with   1) Rai Stage 1 Chronic lymphocytic Leukemia. Found to have trisomy 12 and 13q deletion via 03/03/2020 FISH (CLL Prognosis) Panel  Patient had presented  with increasing dyspnea and was found to have CLL involvement of her airways.  Her dyspnea has completely resolved at this time. 2) history of neoplasm of left breast, primary tumor staging category Tis: lobular carcinoma in situ (LCIS) Left breast LCIS status post lumpectomy 10/06/2014; Started tamoxifen  for breast cancer risk reduction 20 mg daily 11/18/2014 , decrease to 10 mg on 11/22/2017 completed February 2021.   Breast cancer surveillance:  Continue yearly MMGs with PCP  PLAN:  -Discussed lab results on 02/27/24 in detail with patient. CBC normal, showed WBC of 4.8K, hemoglobin of 13.5, and platelets of 241K. -Hgb improved to 13.5 from 13.1 six months ago -lymphocytes normal -LDH WNL at 162 -CMP normal -did not feel any enlarged lymph nodes during physical examination -there is clinical sign or lab evidence of CLL progression at this time -No indication for additional treatment of CLL at this time -We shall continue active surveillance. -Discussed that her vertigo issues are unlikely related to CLL -patient most likely has benign positional vertigo -discussed option of connecting with vestibular physical therapy or ENT physician if her symptoms are persistent -discussed that her mild breathing issues could be related to acid reflux issues. She may consider switching to omeprazole if she has too much acid reflux -advised patient to limit excessive water  intake during main meals  -recommend wearing footwear inside the house to improve foot pain -patient shall return to clinic in 6 months  FOLLOW-UP: RTC with Dr Salomon Cree with labs in 6 months  The total time spent in the appointment was *** minutes* .  All of the patient's questions were answered with apparent satisfaction. The patient knows to call the clinic with any problems, questions or concerns.   Jacquelyn Matt MD MS AAHIVMS [facility] Hematology/Oncology Physician [facility]  .*Total Encounter Time as defined by  the Centers for Medicare and Medicaid Services includes, in addition to the face-to-face time of a patient visit (documented in the note above) non-face-to-face time: obtaining and reviewing outside history, ordering and reviewing medications, tests or procedures, care coordination (communications with other health care professionals or caregivers) and documentation in the medical record.    I,Mitra Faeizi,acting as a Neurosurgeon for Jacquelyn Matt, MD.,have documented all relevant documentation on the behalf of Jacquelyn Matt, MD,as directed by  Jacquelyn Matt, MD while in the presence of Jacquelyn Matt, MD.  ***

## 2024-03-17 DIAGNOSIS — I10 Essential (primary) hypertension: Secondary | ICD-10-CM | POA: Diagnosis not present

## 2024-03-17 DIAGNOSIS — Z853 Personal history of malignant neoplasm of breast: Secondary | ICD-10-CM | POA: Diagnosis not present

## 2024-03-17 DIAGNOSIS — E7800 Pure hypercholesterolemia, unspecified: Secondary | ICD-10-CM | POA: Diagnosis not present

## 2024-03-17 DIAGNOSIS — C9111 Chronic lymphocytic leukemia of B-cell type in remission: Secondary | ICD-10-CM | POA: Diagnosis not present

## 2024-03-24 NOTE — Therapy (Signed)
 OUTPATIENT PHYSICAL THERAPY VESTIBULAR EVALUATION     Patient Name: Colleen Shannon MRN: [id number] DOB:10-07-1956, 68 y.o., female Today's Date: 03/25/2024  END OF SESSION:  PT End of Session - 03/25/24 1446     Visit Number 1    Number of Visits 9    Date for PT Re-Evaluation 04/22/24    Authorization Type BCBS Medicare    PT Start Time 1409    PT Stop Time 1444    PT Time Calculation (min) 35 min    Activity Tolerance Patient tolerated treatment well    Behavior During Therapy WFL for tasks assessed/performed          Past Medical History:  Diagnosis Date   Arthritis    Breast CA (HCC) dx'd 2015   left   Bronchiectasis (HCC)    Cancer (HCC)    hx of skin cancer    CLL (chronic lymphocytic leukemia) (HCC) dx'd 2015   Dyspnea    Dysrhythmia    PVCs   Elevated cholesterol 05/22/2012   Nuc stress test-Normal.   Endometrial polyp    GERD (gastroesophageal reflux disease)    Grade I diastolic dysfunction    noted on echo   History of palpitations    Hypertension 05/22/12   ECHO-EF >55% trace tricupsid regurgitation. There is mild mitral regurgitation.    MR (mitral regurgitation)    mild noted on echo   Plantar fasciitis of left foot    resolved   Pneumonia    history of x 2   PONV (postoperative nausea and vomiting)    Past Surgical History:  Procedure Laterality Date   ABDOMINAL SURGERY     Abdominoplasty   BRONCHIAL BIOPSY  06/29/2020   Procedure: BRONCHIAL BIOPSIES;  Surgeon: Denson Flake, MD;  Location: [facility] ENDOSCOPY;  Service: Cardiopulmonary;;   BRONCHIAL BRUSHINGS  06/29/2020   Procedure: BRONCHIAL BRUSHINGS;  Surgeon: Denson Flake, MD;  Location: [facility] ENDOSCOPY;  Service: Cardiopulmonary;;   BRONCHIAL WASHINGS  06/29/2020   Procedure: BRONCHIAL WASHINGS;  Surgeon: Denson Flake, MD;  Location: [facility] ENDOSCOPY;  Service: Cardiopulmonary;;   CESAREAN SECTION     X 2   CHOLECYSTECTOMY     COLONSCOPY     DILATION AND CURETTAGE OF UTERUS      FOOT SURGERY Left    HYSTEROSCOPY     KNEE ARTHROTOMY Right 06/04/2019   Procedure: Right knee arthrotomy; scar excision;  Surgeon: Liliane Rei, MD;  Location: [facility]S;  Service: Orthopedics;  Laterality: Right;    KNEE CLOSED REDUCTION Right 07/20/2014   Procedure: RIGHT KNEE CLOSED MANIPULATION;  Surgeon: Aurther Blue, MD;  Location: [facility];  Service: Orthopedics;  Laterality: Right;   KNEE SURGERY Right    arthroscopic   PARTIAL KNEE ARTHROPLASTY Left 2017   RADIOACTIVE SEED GUIDED EXCISIONAL BREAST BIOPSY N/A 10/06/2014   Procedure: RADIOACTIVE SEED GUIDED EXCISIONAL BREAST BIOPSY;  Surgeon: Enid Harry, MD;  Location: [facility];  Service: General;  Laterality: N/A;   REFRACTIVE SURGERY     TOTAL KNEE ARTHROPLASTY Right 05/11/2014   Procedure: RIGHT TOTAL KNEE ARTHROPLASTY;  Surgeon: Aurther Blue, MD;  Location: [facility];  Service: Orthopedics;  Laterality: Right;   TOTAL KNEE REVISION Right 12/13/2022   Procedure: TOTAL KNEE REVISION;  Surgeon: Liliane Rei, MD;  Location: [facility];  Service: Orthopedics;  Laterality: Right;   TUBAL LIGATION     VIDEO BRONCHOSCOPY N/A 06/29/2020   Procedure: VIDEO BRONCHOSCOPY WITHOUT FLUORO;  Surgeon: Baldwin Levee,  Delora Ferry, MD;  Location: [facility] ENDOSCOPY;  Service: Cardiopulmonary;  Laterality: N/A;  with BAL   WRIST SURGERY Left    Patient Active Problem List   Diagnosis Date Noted   Obesity 10/03/2022   Atypical lobular hyperplasia of left breast 08/25/2021   Chronic pain 08/25/2021   Gastroesophageal reflux disease 08/25/2021   Lymphocytosis 08/25/2021   Mitral valve disorder 08/25/2021   Personal history of malignant neoplasm of breast 08/25/2021   Prediabetes 08/25/2021   Bunion 03/14/2021   Metatarsalgia of left foot 03/14/2021   Pain in left foot 09/22/2020   Counseling regarding advance care planning and goals of care 07/09/2020   Pain in joint of right ankle 02/09/2020   Arthrofibrosis of total knee  arthroplasty (HCC) 06/04/2019   History of total knee replacement, right 12/17/2017   Chronic lymphocytic leukemia (CLL), B-cell (HCC) 02/08/2016   Neoplasm of left breast, primary tumor staging category Tis: lobular carcinoma in situ (LCIS) 11/18/2014   Essential hypertension 07/29/2014   Hyperlipidemia 07/29/2014   Failed total knee arthroplasty (HCC) 07/20/2014   OA (osteoarthritis) of knee 05/11/2014   Varicose veins of bilateral lower extremities with other complications 09/17/2013   Hypertension    Elevated cholesterol     PCP: Faustina Hood, MD  REFERRING PROVIDER: Faustina Hood, MD   REFERRING DIAG: H81.10 (ICD-10-CM) - Benign paroxysmal vertigo, unspecified ear  THERAPY DIAG:  BPPV (benign paroxysmal positional vertigo), left  Dizziness and giddiness  Unsteadiness on feet  ONSET DATE: a few months   Rationale for Evaluation and Treatment: Rehabilitation  SUBJECTIVE:   SUBJECTIVE STATEMENT: Patient reports that she has had positional vertigo off and on since the '80s. This time it has lasted several months, mostly with tilting head back to get hair done and rolling in bed. Reports that in the past she has done the Epley to make it go away but this time it has not resolved. Denies head trauma, infection/illness, vision changes/double vision, hearing loss. Reports that she had hormonal migraines in her 53s.  Reports HA with dizziness episodes. Reports some imbalance when closing eyes at church.    Pt accompanied by: self  PERTINENT HISTORY: Breast CA, skin CA, CLL, HTN, diastolic dysfunction  PAIN:  Are you having pain? No  PRECAUTIONS: None  RED FLAGS: None   WEIGHT BEARING RESTRICTIONS: No  FALLS: Has patient fallen in last 6 months? No  LIVING ENVIRONMENT: Lives with: lives with their spouse Lives in: House/apartment Stairs: 4 steps to enter; 2 story home but bedroom on main level  PLOF: Independent and Vocation/Vocational requirements: retired  Engineer, civil (consulting)  PATIENT GOALS: improve dizziness   OBJECTIVE:  Note: Objective measures were completed at Evaluation unless otherwise noted.  DIAGNOSTIC FINDINGS: none recent  COGNITION: Overall cognitive status: Within functional limits for tasks assessed   SENSATION: Pt denies N/T in UEs/LEs  POSTURE:  No Significant postural limitations  GAIT: Gait pattern: WFL Assistive device utilized: None Level of assistance: Complete Independence  PATIENT SURVEYS:  DHI 18/100  VESTIBULAR ASSESSMENT   GENERAL OBSERVATION: pt wears readers    OCULOMOTOR EXAM: Ocular Alignment: Cover/Uncover Test: WNL Cover/Cross Cover Test: WNL   Ocular ROM: No Limitations   Spontaneous Nystagmus: absent   Gaze-Induced Nystagmus: absent   Smooth Pursuits: intact   Saccades: intact   Convergence/Divergence: 5 cm    VESTIBULAR - OCULAR REFLEX:    Slow VOR: Normal; c/o lightheadedness horizontal    VOR Cancellation: Normal   Head-Impulse Test: HIT Right: negative HIT Left: negative  AUDITORY SCREEN:  Rinne Test WNL B    POSITIONAL TESTING:  Longsit> supine: latent few beats L upbeating nystagmus  Right Roll Test: negative Left Roll Test: negative; c/o brief dizziness  Right Loaded Dix-Hallpike: negative  Left Loaded Dix-Hallpike: L upbeating torsional nystagmus lasting ~15 sec                                                                                                                                TREATMENT DATE: 03/25/24   Canalith Repositioning:  Epley Left: Number of Reps: 1, Response to Treatment: comment: tolerated well, and Comment: strong nystagmus reversal upon sitting up and retropulsion as well as nausea    PATIENT EDUCATION: Education details: exam findings, prognosis, POC Person educated: Patient Education method: Medical illustrator Education comprehension: verbalized understanding  HOME EXERCISE PROGRAM: Not yet initiated   GOALS: Goals reviewed  with patient? Yes  SHORT TERM GOALS: Target date: 04/08/2024  Patient to be independent with initial HEP. Baseline: HEP initiated Goal status: INITIAL    LONG TERM GOALS: Target date: 05/02/2024  Patient to be independent with advanced HEP. Baseline: Not yet initiated  Goal status: INITIAL  Patient will report 0/10 dizziness with bed mobility.  Baseline: Symptomatic  Goal status: INITIAL  Patient to demonstrate mild-moderate sway with M-CTSIB condition with eyes closed/foam surface in order to improve safety in environments with uneven surfaces and dim lighting. Baseline: NT Goal status: INITIAL  Patient to score at least 23/30 on FGA in order to decrease risk of falls. Baseline: NT Goal status: INITIAL  Patient to score at least 18 points less on DHI in order to meet MCID and improve functional outcomes.  Baseline: 18 Goal status: INITIAL   ASSESSMENT:  CLINICAL IMPRESSION:   Patient is a 68 y/o F presenting to OPPT with c/o dizziness for the past several months.. Denies head trauma, infection/illness, vision changes/double vision, hearing loss. Reports remote hx of migraines and some recent imbalance. Patient today presenting with c/o lightheadedness with horizontal VOR and positive L DH. Patient was treated with L Epley which she tolerated well despite c/o nausea and dizziness upon sitting up. Allowed symptoms to settle before she left. Patient reported that she felt safe to drive home. Would benefit from skilled PT services 1-2x/week for 4 weeks to address aforementioned impairments in order to optimize level of function.    OBJECTIVE IMPAIRMENTS: decreased activity tolerance, decreased balance, and dizziness.   ACTIVITY LIMITATIONS: carrying, lifting, bending, standing, squatting, sleeping, stairs, transfers, bed mobility, bathing, toileting, dressing, reach over head, and hygiene/grooming  PARTICIPATION LIMITATIONS: meal prep, cleaning, laundry, driving, shopping,  community activity, yard work, and church  PERSONAL FACTORS: Age, Past/current experiences, Time since onset of injury/illness/exacerbation, and 3+ comorbidities: Breast CA, skin CA, CLL, HTN, diastolic dysfunction are also affecting patient's functional outcome.   REHAB POTENTIAL: Good  CLINICAL DECISION MAKING: Evolving/moderate complexity  EVALUATION COMPLEXITY: Moderate   PLAN:  PT FREQUENCY: 1-2x/week  PT DURATION: 4 weeks  PLANNED INTERVENTIONS: 97164- PT Re-evaluation, 97750- Physical Performance Testing, 97110-Therapeutic exercises, 97530- Therapeutic activity, [id number]- Neuromuscular re-education, 97535- Self Care, 16109- Manual therapy, [phone]- Gait training, [phone]- Canalith repositioning, [phone] (1-2 muscles), [phone] (3+ muscles)- Dry Needling, Patient/Family education, Balance training, Stair training, Taping, Vestibular training, Cryotherapy, and Moist heat  PLAN FOR NEXT SESSION: MCTSIB, FGA, retest L [facility]     Thaddeus Filippo, PT, DPT 03/25/24 2:59 PM  [facility] at [facility] [address], [address] [place], [place] [address] Phone # [phone] Fax # [phone]

## 2024-03-25 ENCOUNTER — Encounter: Payer: Self-pay | Admitting: Physical Therapy

## 2024-03-25 ENCOUNTER — Other Ambulatory Visit: Payer: Self-pay

## 2024-03-25 ENCOUNTER — Ambulatory Visit: Attending: Family Medicine | Admitting: Physical Therapy

## 2024-03-25 DIAGNOSIS — R2681 Unsteadiness on feet: Secondary | ICD-10-CM | POA: Insufficient documentation

## 2024-03-25 DIAGNOSIS — R42 Dizziness and giddiness: Secondary | ICD-10-CM | POA: Insufficient documentation

## 2024-03-25 DIAGNOSIS — H8112 Benign paroxysmal vertigo, left ear: Secondary | ICD-10-CM | POA: Diagnosis not present

## 2024-03-28 ENCOUNTER — Encounter: Payer: Self-pay | Admitting: Physical Therapy

## 2024-03-28 ENCOUNTER — Ambulatory Visit: Admitting: Physical Therapy

## 2024-03-28 DIAGNOSIS — R2681 Unsteadiness on feet: Secondary | ICD-10-CM | POA: Diagnosis not present

## 2024-03-28 DIAGNOSIS — H8112 Benign paroxysmal vertigo, left ear: Secondary | ICD-10-CM | POA: Diagnosis not present

## 2024-03-28 DIAGNOSIS — R42 Dizziness and giddiness: Secondary | ICD-10-CM

## 2024-03-28 NOTE — Therapy (Signed)
 OUTPATIENT PHYSICAL THERAPY VESTIBULAR TREATMENT     Patient Name: Colleen Shannon MRN: [id number] DOB:11-22-1955, 68 y.o., female Today's Date: 03/28/2024  END OF SESSION:  PT End of Session - 03/28/24 0757     Visit Number 2    Number of Visits 9    Date for PT Re-Evaluation 04/22/24    Authorization Type BCBS Medicare    PT Start Time 0802    PT Stop Time 0835    PT Time Calculation (min) 33 min    Activity Tolerance Patient tolerated treatment well    Behavior During Therapy [facility] for tasks assessed/performed           Past Medical History:  Diagnosis Date   Arthritis    Breast CA (HCC) dx'd 2015   left   Bronchiectasis (HCC)    Cancer (HCC)    hx of skin cancer    CLL (chronic lymphocytic leukemia) (HCC) dx'd 2015   Dyspnea    Dysrhythmia    PVCs   Elevated cholesterol 05/22/2012   Nuc stress test-Normal.   Endometrial polyp    GERD (gastroesophageal reflux disease)    Grade I diastolic dysfunction    noted on echo   History of palpitations    Hypertension 05/22/12   ECHO-EF >55% trace tricupsid regurgitation. There is mild mitral regurgitation.    MR (mitral regurgitation)    mild noted on echo   Plantar fasciitis of left foot    resolved   Pneumonia    history of x 2   PONV (postoperative nausea and vomiting)    Past Surgical History:  Procedure Laterality Date   ABDOMINAL SURGERY     Abdominoplasty   BRONCHIAL BIOPSY  06/29/2020   Procedure: BRONCHIAL BIOPSIES;  Surgeon: Denson Flake, MD;  Location: [facility] ENDOSCOPY;  Service: Cardiopulmonary;;   BRONCHIAL BRUSHINGS  06/29/2020   Procedure: BRONCHIAL BRUSHINGS;  Surgeon: Denson Flake, MD;  Location: [facility] ENDOSCOPY;  Service: Cardiopulmonary;;   BRONCHIAL WASHINGS  06/29/2020   Procedure: BRONCHIAL WASHINGS;  Surgeon: Denson Flake, MD;  Location: [facility]OSCOPY;  Service: Cardiopulmonary;;   CESAREAN SECTION     X 2   CHOLECYSTECTOMY     COLONSCOPY     DILATION AND CURETTAGE OF UTERUS      FOOT SURGERY Left    HYSTEROSCOPY     KNEE ARTHROTOMY Right 06/04/2019   Procedure: Right knee arthrotomy; scar excision;  Surgeon: Liliane Rei, MD;  Location: [facility];  Service: Orthopedics;  Laterality: Right;    KNEE CLOSED REDUCTION Right 07/20/2014   Procedure: RIGHT KNEE CLOSED MANIPULATION;  Surgeon: Aurther Blue, MD;  Location: [facility];  Service: Orthopedics;  Laterality: Right;   KNEE SURGERY Right    arthroscopic   PARTIAL KNEE ARTHROPLASTY Left 2017   RADIOACTIVE SEED GUIDED EXCISIONAL BREAST BIOPSY N/A 10/06/2014   Procedure: RADIOACTIVE SEED GUIDED EXCISIONAL BREAST BIOPSY;  Surgeon: Enid Harry, MD;  Location: [facility];  Service: General;  Laterality: N/A;   REFRACTIVE SURGERY     TOTAL KNEE ARTHROPLASTY Right 05/11/2014   Procedure: RIGHT TOTAL KNEE ARTHROPLASTY;  Surgeon: Aurther Blue, MD;  Location: [facility];  Service: Orthopedics;  Laterality: Right;   TOTAL KNEE REVISION Right 12/13/2022   Procedure: TOTAL KNEE REVISION;  Surgeon: Liliane Rei, MD;  Location: [facility];  Service: Orthopedics;  Laterality: Right;   TUBAL LIGATION     VIDEO BRONCHOSCOPY N/A 06/29/2020   Procedure: VIDEO BRONCHOSCOPY WITHOUT FLUORO;  Surgeon:  Denson Flake, MD;  Location: [facility] ENDOSCOPY;  Service: Cardiopulmonary;  Laterality: N/A;  with BAL   WRIST SURGERY Left    Patient Active Problem List   Diagnosis Date Noted   Obesity 10/03/2022   Atypical lobular hyperplasia of left breast 08/25/2021   Chronic pain 08/25/2021   Gastroesophageal reflux disease 08/25/2021   Lymphocytosis 08/25/2021   Mitral valve disorder 08/25/2021   Personal history of malignant neoplasm of breast 08/25/2021   Prediabetes 08/25/2021   Bunion 03/14/2021   Metatarsalgia of left foot 03/14/2021   Pain in left foot 09/22/2020   Counseling regarding advance care planning and goals of care 07/09/2020   Pain in joint of right ankle 02/09/2020   Arthrofibrosis of total knee  arthroplasty (HCC) 06/04/2019   History of total knee replacement, right 12/17/2017   Chronic lymphocytic leukemia (CLL), B-cell (HCC) 02/08/2016   Neoplasm of left breast, primary tumor staging category Tis: lobular carcinoma in situ (LCIS) 11/18/2014   Essential hypertension 07/29/2014   Hyperlipidemia 07/29/2014   Failed total knee arthroplasty (HCC) 07/20/2014   OA (osteoarthritis) of knee 05/11/2014   Varicose veins of bilateral lower extremities with other complications 09/17/2013   Hypertension    Elevated cholesterol     PCP: Faustina Hood, MD  REFERRING PROVIDER: Faustina Hood, MD   REFERRING DIAG: H81.10 (ICD-10-CM) - Benign paroxysmal vertigo, unspecified ear  THERAPY DIAG:  Dizziness and giddiness  Unsteadiness on feet  ONSET DATE: a few months   Rationale for Evaluation and Treatment: Rehabilitation  SUBJECTIVE:   SUBJECTIVE STATEMENT: Things are much better.  No more dizziness with bed mobility.  Feel a little nauseous this morning (and got sick last night)-think this may be from something I ate.   Pt accompanied by: self  PERTINENT HISTORY: Breast CA, skin CA, CLL, HTN, diastolic dysfunction  PAIN:  Are you having pain? No  PRECAUTIONS: None  RED FLAGS: None   WEIGHT BEARING RESTRICTIONS: No  FALLS: Has patient fallen in last 6 months? 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, civil (consulting)  PATIENT GOALS: improve dizziness   OBJECTIVE:    TODAY'S TREATMENT: 03/28/2024 Activity Comments  FGA 24/30   Standing balance: -Romberg EO and EC head turns/nods 5 reps -Partial tandem gait EO head turns/nods, then EC 30 sec Mild sway               M-CTSIB  Condition 1: Firm Surface, EO 30 Sec, Normal Sway  Condition 2: Firm Surface, EC 30 Sec, Mild Sway  Condition 3: Foam  Surface, EO 30 Sec, Mild Sway  Condition 4: Foam Surface, EC 30 Sec, Moderate Sway    [facility] PT Assessment - 03/28/24 0810       Functional Gait  Assessment   Gait assessed  Yes    Gait Level Surface Walks 20 ft in less than 7 sec but greater than 5.5 sec, uses assistive device, slower speed, mild gait deviations, or deviates 6-10 in outside of the 12 in walkway width.   6.4   Change in Gait Speed Able to smoothly change walking speed without loss of balance or gait deviation. Deviate no more than 6 in outside of the 12 in walkway width.    Gait with Horizontal Head Turns Performs head turns smoothly with slight change in gait velocity (eg, minor disruption to smooth gait path),  deviates 6-10 in outside 12 in walkway width, or uses an assistive device.    Gait with Vertical Head Turns Performs head turns with no change in gait. Deviates no more than 6 in outside 12 in walkway width.    Gait and Pivot Turn Pivot turns safely within 3 sec and stops quickly with no loss of balance.    Step Over Obstacle Is able to step over one shoe box (4.5 in total height) without changing gait speed. No evidence of imbalance.    Gait with Narrow Base of Support Is able to ambulate for 10 steps heel to toe with no staggering.    Gait with Eyes Closed Walks 20 ft, slow speed, abnormal gait pattern, evidence for imbalance, deviates 10-15 in outside 12 in walkway width. Requires more than 9 sec to ambulate 20 ft.   9.53 sec   Ambulating Backwards Walks 20 ft, no assistive devices, good speed, no evidence for imbalance, normal gait    Steps Alternating feet, must use rail.    Total Score 24         Access Code: [email or web address] URL: [email or web address] Date: 03/28/2024 Prepared by: [facility] - Outpatient  Rehab - [facility]  Exercises - Narrow Stance with Counter Support  - 1 x daily - 7 x weekly - 1 sets - 3 reps - Standing Romberg to 1/2 Tandem Stance  - 1 x daily - 7 x weekly - 1 sets - 3 reps  - 30 sec hold  PATIENT EDUCATION: Education details: HEP, discussed fall prevention, balance education based on MCTSIB and FGA findings (use of light for vision at night, use of visual target 10-15 ft ahead while walking vs looking straight down at ground), rationale for progression of HEP Person educated: Patient Education method: Explanation, Demonstration, and Handouts Education comprehension: verbalized understanding and returned demonstration  ------------------------------------- Note: Objective measures were completed at Evaluation unless otherwise noted.  DIAGNOSTIC FINDINGS: none recent  COGNITION: Overall cognitive status: Within functional limits for tasks assessed   SENSATION: Pt denies N/T in UEs/LEs  POSTURE:  No Significant postural limitations  GAIT: Gait pattern: WFL Assistive device utilized: None Level of assistance: Complete Independence  PATIENT SURVEYS:  DHI 18/100  VESTIBULAR ASSESSMENT   GENERAL OBSERVATION: pt wears readers    OCULOMOTOR EXAM: Ocular Alignment: Cover/Uncover Test: WNL Cover/Cross Cover Test: WNL   Ocular ROM: No Limitations   Spontaneous Nystagmus: absent   Gaze-Induced Nystagmus: absent   Smooth Pursuits: intact   Saccades: intact   Convergence/Divergence: 5 cm    VESTIBULAR - OCULAR REFLEX:    Slow VOR: Normal; c/o lightheadedness horizontal    VOR Cancellation: Normal   Head-Impulse Test: HIT Right: negative HIT Left: negative    AUDITORY SCREEN:  Rinne Test WNL B    POSITIONAL TESTING:  Longsit> supine: latent few beats L upbeating nystagmus  Right Roll Test: negative Left Roll Test: negative; c/o brief dizziness  Right Loaded Dix-Hallpike: negative  Left Loaded Dix-Hallpike: L upbeating torsional nystagmus lasting ~15 sec  TREATMENT DATE: 03/25/24   Canalith Repositioning:  Epley  Left: Number of Reps: 1, Response to Treatment: comment: tolerated well, and Comment: strong nystagmus reversal upon sitting up and retropulsion as well as nausea    PATIENT EDUCATION: Education details: exam findings, prognosis, POC Person educated: Patient Education method: Medical illustrator Education comprehension: verbalized understanding  HOME EXERCISE PROGRAM: Not yet initiated   GOALS: Goals reviewed with patient? Yes  SHORT TERM GOALS: Target date: 04/08/2024  Patient to be independent with initial HEP. Baseline: HEP initiated Goal status: IN PROGRESS    LONG TERM GOALS: Target date: 05/02/2024  Patient to be independent with advanced HEP. Baseline: Not yet initiated  Goal status: IN PROGRESS  Patient will report 0/10 dizziness with bed mobility.  Baseline: Symptomatic  Goal status: IN PROGRESS  Patient to demonstrate mild-moderate sway with M-CTSIB condition with eyes closed/foam surface in order to improve safety in environments with uneven surfaces and dim lighting. Baseline: NT Goal status: IN PROGRESS  Patient to score at least 23/30 on FGA in order to decrease risk of falls. Baseline: NT Goal status: IN PROGRESS  Patient to score at least 18 points less on DHI in order to meet MCID and improve functional outcomes.  Baseline: 18 Goal status: IN PROGRESS   ASSESSMENT:  CLINICAL IMPRESSION: Pt presents today with reports of no additional vertigo since last visit.  She does note nausea and episode of vomiting last night (she does not feel this is vertigo related, but rather something she ate).  For this reason, pt/PT opt to hold on BPPV re-testing today.  Skilled PT session focused on assessing balance with MCTSIB and FGA tests.  She has mild-mod sway with vision removed on Conditions 2 and 4 on MCTSIB; she scores 24/30 on FGA, indicating decreased risk of falls.  On FGA, she has harder time with EC and with head turns. Initiated HEP to reflect  these deficits, with pt return demo understanding. Pt will continue to benefit from skilled PT towards goals for improved dizziness and decreased fall risk.   OBJECTIVE IMPAIRMENTS: decreased activity tolerance, decreased balance, and dizziness.   ACTIVITY LIMITATIONS: carrying, lifting, bending, standing, squatting, sleeping, stairs, transfers, bed mobility, bathing, toileting, dressing, reach over head, and hygiene/grooming  PARTICIPATION LIMITATIONS: meal prep, cleaning, laundry, driving, shopping, community activity, yard work, and church  PERSONAL FACTORS: Age, Past/current experiences, Time since onset of injury/illness/exacerbation, and 3+ comorbidities: Breast CA, skin CA, CLL, HTN, diastolic dysfunction are also affecting patient's functional outcome.   REHAB POTENTIAL: Good  CLINICAL DECISION MAKING: Evolving/moderate complexity  EVALUATION COMPLEXITY: Moderate   PLAN:  PT FREQUENCY: 1-2x/week  PT DURATION: 4 weeks  PLANNED INTERVENTIONS: 97164- PT Re-evaluation, 97750- Physical Performance Testing, 97110-Therapeutic exercises, 97530- Therapeutic activity, 97112- Neuromuscular re-education, 97535- Self Care, 06301- Manual therapy, [phone]- Gait training, [phone]- Canalith repositioning, [phone] (1-2 muscles), [phone] (3+ muscles)- Dry Needling, Patient/Family education, Balance training, Stair training, Taping, Vestibular training, Cryotherapy, and Moist heat  PLAN FOR NEXT SESSION: Retest L DH; review/progress HEP for multi-sensory balance exercises     Dessie Flow, PT 03/28/24 8:42 AM Phone: [phone] Fax: [phone]  [facility] at [facility] [address] [place], [place] [address] Phone # [phone] Fax # [phone]

## 2024-04-08 NOTE — Therapy (Signed)
 OUTPATIENT PHYSICAL THERAPY VESTIBULAR TREATMENT     Patient Name: Colleen Shannon MRN: [id number] DOB:1955-11-18, 68 y.o., female Today's Date: 04/09/2024   Progress Note Reporting Period 03/25/24 to 04/09/24  See note below for Objective Data and Assessment of Progress/Goals.      END OF SESSION:  PT End of Session - 04/09/24 0918     Visit Number 3    Number of Visits 9    Date for PT Re-Evaluation 04/22/24    Authorization Type BCBS Medicare    PT Start Time 0846    PT Stop Time 0917    PT Time Calculation (min) 31 min    Activity Tolerance Patient tolerated treatment well    Behavior During Therapy [facility] for tasks assessed/performed            Past Medical History:  Diagnosis Date   Arthritis    Breast CA (HCC) dx'd 2015   left   Bronchiectasis (HCC)    Cancer (HCC)    hx of skin cancer    CLL (chronic lymphocytic leukemia) (HCC) dx'd 2015   Dyspnea    Dysrhythmia    PVCs   Elevated cholesterol 05/22/2012   Nuc stress test-Normal.   Endometrial polyp    GERD (gastroesophageal reflux disease)    Grade I diastolic dysfunction    noted on echo   History of palpitations    Hypertension 05/22/12   ECHO-EF >55% trace tricupsid regurgitation. There is mild mitral regurgitation.    MR (mitral regurgitation)    mild noted on echo   Plantar fasciitis of left foot    resolved   Pneumonia    history of x 2   PONV (postoperative nausea and vomiting)    Past Surgical History:  Procedure Laterality Date   ABDOMINAL SURGERY     Abdominoplasty   BRONCHIAL BIOPSY  06/29/2020   Procedure: BRONCHIAL BIOPSIES;  Surgeon: Shelah Lamar GORMAN, MD;  Location: [facility] ENDOSCOPY;  Service: Cardiopulmonary;;   BRONCHIAL BRUSHINGS  06/29/2020   Procedure: BRONCHIAL BRUSHINGS;  Surgeon: Shelah Lamar GORMAN, MD;  Location: [facility] ENDOSCOPY;  Service: Cardiopulmonary;;   BRONCHIAL WASHINGS  06/29/2020   Procedure: BRONCHIAL WASHINGS;  Surgeon: Shelah Lamar GORMAN, MD;  Location: [facility] ENDOSCOPY;   Service: Cardiopulmonary;;   CESAREAN SECTION     X 2   CHOLECYSTECTOMY     COLONSCOPY     DILATION AND CURETTAGE OF UTERUS     FOOT SURGERY Left    HYSTEROSCOPY     KNEE ARTHROTOMY Right 06/04/2019   Procedure: Right knee arthrotomy; scar excision;  Surgeon: Melodi Lerner, MD;  Location: [facility] ORS;  Service: Orthopedics;  Laterality: Right;    KNEE CLOSED REDUCTION Right 07/20/2014   Procedure: RIGHT KNEE CLOSED MANIPULATION;  Surgeon: Lerner Melodi GAILS, MD;  Location: [facility] ORS;  Service: Orthopedics;  Laterality: Right;   KNEE SURGERY Right    arthroscopic   PARTIAL KNEE ARTHROPLASTY Left 2017   RADIOACTIVE SEED GUIDED EXCISIONAL BREAST BIOPSY N/A 10/06/2014   Procedure: RADIOACTIVE SEED GUIDED EXCISIONAL BREAST BIOPSY;  Surgeon: Donnice Bury, MD;  Location: [facility];  Service: General;  Laterality: N/A;   REFRACTIVE SURGERY     TOTAL KNEE ARTHROPLASTY Right 05/11/2014   Procedure: RIGHT TOTAL KNEE ARTHROPLASTY;  Surgeon: Lerner Melodi GAILS, MD;  Location: [facility]S;  Service: Orthopedics;  Laterality: Right;   TOTAL KNEE REVISION Right 12/13/2022   Procedure: TOTAL KNEE REVISION;  Surgeon: Melodi Lerner, MD;  Location: [facility]S;  Service:  Orthopedics;  Laterality: Right;   TUBAL LIGATION     VIDEO BRONCHOSCOPY N/A 06/29/2020   Procedure: VIDEO BRONCHOSCOPY WITHOUT FLUORO;  Surgeon: Shelah Lamar RAMAN, MD;  Location: [facility] ENDOSCOPY;  Service: Cardiopulmonary;  Laterality: N/A;  with BAL   WRIST SURGERY Left    Patient Active Problem List   Diagnosis Date Noted   Obesity 10/03/2022   Atypical lobular hyperplasia of left breast 08/25/2021   Chronic pain 08/25/2021   Gastroesophageal reflux disease 08/25/2021   Lymphocytosis 08/25/2021   Mitral valve disorder 08/25/2021   Personal history of malignant neoplasm of breast 08/25/2021   Prediabetes 08/25/2021   Bunion 03/14/2021   Metatarsalgia of left foot 03/14/2021   Pain in left foot 09/22/2020   Counseling regarding  advance care planning and goals of care 07/09/2020   Pain in joint of right ankle 02/09/2020   Arthrofibrosis of total knee arthroplasty (HCC) 06/04/2019   History of total knee replacement, right 12/17/2017   Chronic lymphocytic leukemia (CLL), B-cell (HCC) 02/08/2016   Neoplasm of left breast, primary tumor staging category Tis: lobular carcinoma in situ (LCIS) 11/18/2014   Essential hypertension 07/29/2014   Hyperlipidemia 07/29/2014   Failed total knee arthroplasty (HCC) 07/20/2014   OA (osteoarthritis) of knee 05/11/2014   Varicose veins of bilateral lower extremities with other complications 09/17/2013   Hypertension    Elevated cholesterol     PCP: Claudene Pellet, MD  REFERRING PROVIDER: Claudene Pellet, MD   REFERRING DIAG: H81.10 (ICD-10-CM) - Benign paroxysmal vertigo, unspecified ear  THERAPY DIAG:  Dizziness and giddiness  Unsteadiness on feet  BPPV (benign paroxysmal positional vertigo), left  ONSET DATE: a few months   Rationale for Evaluation and Treatment: Rehabilitation  SUBJECTIVE:   SUBJECTIVE STATEMENT: Dizziness is gone. Been working on the balance exercises.   Pt accompanied by: self  PERTINENT HISTORY: Breast CA, skin CA, CLL, HTN, diastolic dysfunction  PAIN:  Are you having pain? No  PRECAUTIONS: None  RED FLAGS: None   WEIGHT BEARING RESTRICTIONS: No  FALLS: Has patient fallen in last 6 months? 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, civil (consulting)  PATIENT GOALS: improve dizziness   OBJECTIVE:     TODAY'S TREATMENT: 04/09/24 Activity Comments  L loaded DH Negative   DHI   review of HEP: romberg romberg to 1/2 tandem Good stability   Walking with EC Along counter  Wide and romberg EC on foam Mild sway  fwd/back stepping then + head turns/nods Occasional  reaching strategy      HOME EXERCISE PROGRAM Last updated: 04/09/24 Access Code: [email or web address] URL: [email or web address] Date: 04/09/2024 Prepared by: [facility] - Outpatient  Rehab - [facility]  Exercises - Walking with Eyes Closed and Counter Support  - 1 x daily - 5 x weekly - 2 sets - 5 reps - Alternating Step Forward with Support  - 1 x daily - 5 x weekly - 2 sets - 10 reps - Standing Balance with Eyes Closed on Foam  - 1 x daily - 5 x weekly - 2 sets - 30 sec hold  PATIENT EDUCATION: Education details: discussed risk factors for BPPV and process of getting new referral is sx return; HEP update  Person educated: Patient Education method: Explanation, Demonstration, Tactile cues, Verbal cues, and Handouts Education comprehension: verbalized understanding and returned demonstration     ------------------------------------- Note:  Objective measures were completed at Evaluation unless otherwise noted.  DIAGNOSTIC FINDINGS: none recent  COGNITION: Overall cognitive status: Within functional limits for tasks assessed   SENSATION: Pt denies N/T in UEs/LEs  POSTURE:  No Significant postural limitations  GAIT: Gait pattern: WFL Assistive device utilized: None Level of assistance: Complete Independence  PATIENT SURVEYS:  DHI 18/100  VESTIBULAR ASSESSMENT   GENERAL OBSERVATION: pt wears readers    OCULOMOTOR EXAM: Ocular Alignment: Cover/Uncover Test: WNL Cover/Cross Cover Test: WNL   Ocular ROM: No Limitations   Spontaneous Nystagmus: absent   Gaze-Induced Nystagmus: absent   Smooth Pursuits: intact   Saccades: intact   Convergence/Divergence: 5 cm    VESTIBULAR - OCULAR REFLEX:    Slow VOR: Normal; c/o lightheadedness horizontal    VOR Cancellation: Normal   Head-Impulse Test: HIT Right: negative HIT Left: negative    AUDITORY SCREEN:  Rinne Test WNL B    POSITIONAL TESTING:  Longsit> supine: latent few beats L upbeating nystagmus   Right Roll Test: negative Left Roll Test: negative; c/o brief dizziness  Right Loaded Dix-Hallpike: negative  Left Loaded Dix-Hallpike: L upbeating torsional nystagmus lasting ~15 sec                                                                                                                                TREATMENT DATE: 03/25/24   Canalith Repositioning:  Epley Left: Number of Reps: 1, Response to Treatment: comment: tolerated well, and Comment: strong nystagmus reversal upon sitting up and retropulsion as well as nausea    PATIENT EDUCATION: Education details: exam findings, prognosis, POC Person educated: Patient Education method: Medical illustrator Education comprehension: verbalized understanding  HOME EXERCISE PROGRAM: Not yet initiated   GOALS: Goals reviewed with patient? Yes  SHORT TERM GOALS: Target date: 04/08/2024  Patient to be independent with initial HEP. Baseline: HEP initiated Goal status: MET    LONG TERM GOALS: Target date: 05/02/2024  Patient to be independent with advanced HEP. Baseline: Not yet initiated  Goal status: MET 04/08/24  Patient will report 0/10 dizziness with bed mobility.  Baseline: Symptomatic; negative 04/08/24 Goal status: MET 04/08/24  Patient to demonstrate mild-moderate sway with M-CTSIB condition with eyes closed/foam surface in order to improve safety in environments with uneven surfaces and dim lighting. Baseline: moderate sway; mild sway 04/08/24 Goal status: MET 04/08/24  Patient to score at least 23/30 on FGA in order to decrease risk of falls. Baseline: 24 Goal status: MET  Patient to score at least 18 points less on DHI in order to meet MCID and improve functional outcomes.  Baseline: 18; 2 04/08/24 Goal status: NOT MET 04/08/24   ASSESSMENT:  CLINICAL IMPRESSION: Patient arrived to session with resolution of dizziness. Patient demonstrated improvement in DHI score down to 2/100. Multisensory balance goal  has also been met. Reviewed HEP and updated with increased challenges- patient reported understanding. Patient has met most goals and is ready for  30 day hold in case sx return- patient in agreement.   OBJECTIVE IMPAIRMENTS: decreased activity tolerance, decreased balance, and dizziness.   ACTIVITY LIMITATIONS: carrying, lifting, bending, standing, squatting, sleeping, stairs, transfers, bed mobility, bathing, toileting, dressing, reach over head, and hygiene/grooming  PARTICIPATION LIMITATIONS: meal prep, cleaning, laundry, driving, shopping, community activity, yard work, and church  PERSONAL FACTORS: Age, Past/current experiences, Time since onset of injury/illness/exacerbation, and 3+ comorbidities: Breast CA, skin CA, CLL, HTN, diastolic dysfunction are also affecting patient's functional outcome.   REHAB POTENTIAL: Good  CLINICAL DECISION MAKING: Evolving/moderate complexity  EVALUATION COMPLEXITY: Moderate   PLAN:  PT FREQUENCY: 1-2x/week  PT DURATION: 4 weeks  PLANNED INTERVENTIONS: 97164- PT Re-evaluation, 97750- Physical Performance Testing, 97110-Therapeutic exercises, 97530- Therapeutic activity, 97112- Neuromuscular re-education, 97535- Self Care, 02859- Manual therapy, [id number]- Gait training, [phone]- Canalith repositioning, [phone] (1-2 muscles), 20561 (3+ muscles)- Dry Needling, Patient/Family education, Balance training, Stair training, Taping, Vestibular training, Cryotherapy, and Moist heat  PLAN FOR NEXT SESSION: 30 day hold    Louana Terrilyn Christians, PT, DPT 04/09/24 9:19 AM  [facility] [address] [place], [place] [address] Phone # [phone] Fax # [phone]

## 2024-04-09 ENCOUNTER — Encounter: Payer: Self-pay | Admitting: Physical Therapy

## 2024-04-09 ENCOUNTER — Ambulatory Visit: Attending: Family Medicine | Admitting: Physical Therapy

## 2024-04-09 DIAGNOSIS — H8112 Benign paroxysmal vertigo, left ear: Secondary | ICD-10-CM | POA: Diagnosis not present

## 2024-04-09 DIAGNOSIS — R42 Dizziness and giddiness: Secondary | ICD-10-CM | POA: Insufficient documentation

## 2024-04-09 DIAGNOSIS — R2681 Unsteadiness on feet: Secondary | ICD-10-CM | POA: Insufficient documentation

## 2024-04-14 ENCOUNTER — Ambulatory Visit: Admitting: Physical Therapy

## 2024-04-17 ENCOUNTER — Ambulatory Visit: Admitting: Physical Therapy

## 2024-04-21 ENCOUNTER — Encounter: Admitting: Physical Therapy

## 2024-04-22 DIAGNOSIS — M19071 Primary osteoarthritis, right ankle and foot: Secondary | ICD-10-CM | POA: Diagnosis not present

## 2024-04-23 ENCOUNTER — Encounter: Admitting: Physical Therapy

## 2024-05-02 ENCOUNTER — Encounter: Admitting: Physical Therapy

## 2024-05-27 DIAGNOSIS — K219 Gastro-esophageal reflux disease without esophagitis: Secondary | ICD-10-CM | POA: Diagnosis not present

## 2024-05-27 DIAGNOSIS — E782 Mixed hyperlipidemia: Secondary | ICD-10-CM | POA: Diagnosis not present

## 2024-05-27 DIAGNOSIS — I10 Essential (primary) hypertension: Secondary | ICD-10-CM | POA: Diagnosis not present

## 2024-06-11 DIAGNOSIS — M19071 Primary osteoarthritis, right ankle and foot: Secondary | ICD-10-CM | POA: Diagnosis not present

## 2024-06-30 DIAGNOSIS — K080 Exfoliation of teeth due to systemic causes: Secondary | ICD-10-CM | POA: Diagnosis not present

## 2024-08-04 DIAGNOSIS — C44319 Basal cell carcinoma of skin of other parts of face: Secondary | ICD-10-CM | POA: Diagnosis not present

## 2024-08-04 DIAGNOSIS — L821 Other seborrheic keratosis: Secondary | ICD-10-CM | POA: Diagnosis not present

## 2024-08-04 DIAGNOSIS — C4441 Basal cell carcinoma of skin of scalp and neck: Secondary | ICD-10-CM | POA: Diagnosis not present

## 2024-08-04 DIAGNOSIS — L905 Scar conditions and fibrosis of skin: Secondary | ICD-10-CM | POA: Diagnosis not present

## 2024-08-04 DIAGNOSIS — L814 Other melanin hyperpigmentation: Secondary | ICD-10-CM | POA: Diagnosis not present

## 2024-08-04 DIAGNOSIS — Z85828 Personal history of other malignant neoplasm of skin: Secondary | ICD-10-CM | POA: Diagnosis not present

## 2024-08-05 ENCOUNTER — Other Ambulatory Visit: Payer: Self-pay | Admitting: Orthopaedic Surgery

## 2024-08-05 DIAGNOSIS — M19071 Primary osteoarthritis, right ankle and foot: Secondary | ICD-10-CM

## 2024-08-05 DIAGNOSIS — M6289 Other specified disorders of muscle: Secondary | ICD-10-CM | POA: Diagnosis not present

## 2024-08-07 ENCOUNTER — Ambulatory Visit
Admission: RE | Admit: 2024-08-07 | Discharge: 2024-08-07 | Disposition: A | Discharge status: Home / Self Care | Source: Ambulatory Visit | Attending: Orthopaedic Surgery | Admitting: Orthopaedic Surgery

## 2024-08-07 DIAGNOSIS — M19071 Primary osteoarthritis, right ankle and foot: Secondary | ICD-10-CM | POA: Diagnosis not present

## 2024-09-01 ENCOUNTER — Other Ambulatory Visit: Payer: Self-pay

## 2024-09-01 DIAGNOSIS — Z124 Encounter for screening for malignant neoplasm of cervix: Secondary | ICD-10-CM | POA: Diagnosis not present

## 2024-09-01 DIAGNOSIS — C9110 Chronic lymphocytic leukemia of B-cell type not having achieved remission: Secondary | ICD-10-CM

## 2024-09-01 DIAGNOSIS — Z01419 Encounter for gynecological examination (general) (routine) without abnormal findings: Secondary | ICD-10-CM | POA: Diagnosis not present

## 2024-09-01 DIAGNOSIS — Z6827 Body mass index (BMI) 27.0-27.9, adult: Secondary | ICD-10-CM | POA: Diagnosis not present

## 2024-09-02 ENCOUNTER — Inpatient Hospital Stay (HOSPITAL_BASED_OUTPATIENT_CLINIC_OR_DEPARTMENT_OTHER): Admitting: Hematology

## 2024-09-02 ENCOUNTER — Inpatient Hospital Stay: Attending: Hematology

## 2024-09-02 VITALS — BP 133/59 | HR 75 | Temp 97.7°F | Resp 20 | Wt 143.3 lb

## 2024-09-02 DIAGNOSIS — R635 Abnormal weight gain: Secondary | ICD-10-CM | POA: Diagnosis not present

## 2024-09-02 DIAGNOSIS — C9110 Chronic lymphocytic leukemia of B-cell type not having achieved remission: Secondary | ICD-10-CM

## 2024-09-02 DIAGNOSIS — Z86000 Personal history of in-situ neoplasm of breast: Secondary | ICD-10-CM | POA: Diagnosis not present

## 2024-09-02 LAB — CBC WITH DIFFERENTIAL (CANCER CENTER ONLY)
Abs Immature Granulocytes: 0.02 K/uL (ref 0.00–0.07)
Basophils Absolute: 0.1 K/uL (ref 0.0–0.1)
Basophils Relative: 1 %
Eosinophils Absolute: 0.4 K/uL (ref 0.0–0.5)
Eosinophils Relative: 6 %
HCT: 40.4 % (ref 36.0–46.0)
Hemoglobin: 13.6 g/dL (ref 12.0–15.0)
Immature Granulocytes: 0 %
Lymphocytes Relative: 29 %
Lymphs Abs: 1.6 K/uL (ref 0.7–4.0)
MCH: 29.6 pg (ref 26.0–34.0)
MCHC: 33.7 g/dL (ref 30.0–36.0)
MCV: 88 fL (ref 80.0–100.0)
Monocytes Absolute: 0.5 K/uL (ref 0.1–1.0)
Monocytes Relative: 8 %
Neutro Abs: 3.2 K/uL (ref 1.7–7.7)
Neutrophils Relative %: 56 %
Platelet Count: 266 K/uL (ref 150–400)
RBC: 4.59 MIL/uL (ref 3.87–5.11)
RDW: 13.2 % (ref 11.5–15.5)
WBC Count: 5.7 K/uL (ref 4.0–10.5)
nRBC: 0 % (ref 0.0–0.2)

## 2024-09-02 LAB — CMP (CANCER CENTER ONLY)
ALT: 21 U/L (ref 0–44)
AST: 24 U/L (ref 15–41)
Albumin: 4.9 g/dL (ref 3.5–5.0)
Alkaline Phosphatase: 51 U/L (ref 38–126)
Anion gap: 10 (ref 5–15)
BUN: 17 mg/dL (ref 8–23)
CO2: 29 mmol/L (ref 22–32)
Calcium: 10.4 mg/dL — ABNORMAL HIGH (ref 8.9–10.3)
Chloride: 102 mmol/L (ref 98–111)
Creatinine: 0.73 mg/dL (ref 0.44–1.00)
GFR, Estimated: >60 mL/min (ref >60)
Glucose, Bld: 101 mg/dL — ABNORMAL HIGH (ref 70–99)
Potassium: 4.1 mmol/L (ref 3.5–5.1)
Sodium: 140 mmol/L (ref 135–145)
Total Bilirubin: 0.6 mg/dL (ref 0.0–1.2)
Total Protein: 7.4 g/dL (ref 6.5–8.1)

## 2024-09-02 LAB — LACTATE DEHYDROGENASE: LDH: 225 U/L (ref 105–235)

## 2024-09-02 NOTE — Progress Notes (Signed)
 HEMATOLOGY ONCOLOGY PROGRESS NOTE  Date of service: 09/02/2024  Patient Care Team: Claudene Pellet, MD as PCP - General (Family Medicine) Burnard Debby LABOR, MD (Inactive) as PCP - Cardiology (Cardiology)  CHIEF COMPLAINT/PURPOSE OF CONSULTATION: Follow-up for continued evaluation and management of CLL  HISTORY OF PRESENTING ILLNESS: Please see previous note for details on initial presentation.   SUMMARY OF ONCOLOGIC HISTORY: Oncology History  Chronic lymphocytic leukemia (CLL), B-cell (HCC)  02/08/2016 Initial Diagnosis   Chronic lymphocytic leukemia (CLL), B-cell (HCC)   07/22/2020 - 12/23/2020 Chemotherapy   Patient is on Treatment Plan : LEUKEMIA CLL Obinutuzumab  q28d       INTERVAL HISTORY: Colleen Shannon is a 68 y.o. female who is here today for continued evaluation and management of CLL.   she was last seen by me on 02/27/2024; at the time she mentioned experiencing chronic frequent vertigo. She has experienced some weight gain.  Today, she reports to be doing well.  She reports some SOB when she lays down at night, but this is not limiting.  She is having foot surgery to repair her hammer toe in January 2026.   She has had her influenza vaccine but not her COVID-19 vaccine.   She denies any new infection issues, new lumps/bumps, or SOB.NO fevers/chills/nightsweats.   REVIEW OF SYSTEMS:   10 Point review of systems of done and is negative except as noted above.  MEDICAL HISTORY Past Medical History:  Diagnosis Date   Arthritis    Breast CA (HCC) dx'd 2015   left   Bronchiectasis (HCC)    Cancer (HCC)    hx of skin cancer    CLL (chronic lymphocytic leukemia) (HCC) dx'd 2015   Dyspnea    Dysrhythmia    PVCs   Elevated cholesterol 05/22/2012   Nuc stress test-Normal.   Endometrial polyp    GERD (gastroesophageal reflux disease)    Grade I diastolic dysfunction    noted on echo   History of palpitations    Hypertension 05/22/12   ECHO-EF >55% trace  tricupsid regurgitation. There is mild mitral regurgitation.    MR (mitral regurgitation)    mild noted on echo   Plantar fasciitis of left foot    resolved   Pneumonia    history of x 2   PONV (postoperative nausea and vomiting)     SURGICAL HISTORY Past Surgical History:  Procedure Laterality Date   ABDOMINAL SURGERY     Abdominoplasty   BRONCHIAL BIOPSY  06/29/2020   Procedure: BRONCHIAL BIOPSIES;  Surgeon: Shelah Lamar GORMAN, MD;  Location: [facility] ENDOSCOPY;  Service: Cardiopulmonary;;   BRONCHIAL BRUSHINGS  06/29/2020   Procedure: BRONCHIAL BRUSHINGS;  Surgeon: Shelah Lamar GORMAN, MD;  Location: [facility] ENDOSCOPY;  Service: Cardiopulmonary;;   BRONCHIAL WASHINGS  06/29/2020   Procedure: BRONCHIAL WASHINGS;  Surgeon: Shelah Lamar GORMAN, MD;  Location: [facility] ENDOSCOPY;  Service: Cardiopulmonary;;   CESAREAN SECTION     X 2   CHOLECYSTECTOMY     COLONSCOPY     DILATION AND CURETTAGE OF UTERUS     FOOT SURGERY Left    HYSTEROSCOPY     KNEE ARTHROTOMY Right 06/04/2019   Procedure: Right knee arthrotomy; scar excision;  Surgeon: Melodi Lerner, MD;  Location: [facility] ORS;  Service: Orthopedics;  Laterality: Right;    KNEE CLOSED REDUCTION Right 07/20/2014   Procedure: RIGHT KNEE CLOSED MANIPULATION;  Surgeon: Lerner Melodi GAILS, MD;  Location: [facility];  Service: Orthopedics;  Laterality: Right;   KNEE SURGERY Right  arthroscopic   PARTIAL KNEE ARTHROPLASTY Left 2017   RADIOACTIVE SEED GUIDED EXCISIONAL BREAST BIOPSY N/A 10/06/2014   Procedure: RADIOACTIVE SEED GUIDED EXCISIONAL BREAST BIOPSY;  Surgeon: Donnice Bury, MD;  Location: [facility];  Service: General;  Laterality: N/A;   REFRACTIVE SURGERY     TOTAL KNEE ARTHROPLASTY Right 05/11/2014   Procedure: RIGHT TOTAL KNEE ARTHROPLASTY;  Surgeon: Dempsey Melodi GAILS, MD;  Location: [facility];  Service: Orthopedics;  Laterality: Right;   TOTAL KNEE REVISION Right 12/13/2022   Procedure: TOTAL KNEE REVISION;  Surgeon: Melodi Dempsey, MD;   Location: [facility];  Service: Orthopedics;  Laterality: Right;   TUBAL LIGATION     VIDEO BRONCHOSCOPY N/A 06/29/2020   Procedure: VIDEO BRONCHOSCOPY WITHOUT FLUORO;  Surgeon: Shelah Lamar RAMAN, MD;  Location: [facility] ENDOSCOPY;  Service: Cardiopulmonary;  Laterality: N/A;  with BAL   WRIST SURGERY Left     SOCIAL HISTORY Social History   Tobacco Use   Smoking status: Never   Smokeless tobacco: Never  Vaping Use   Vaping status: Never Used  Substance Use Topics   Alcohol use: Yes    Comment: occasional    Drug use: No    Social History   Social History Narrative   Not on file    SOCIAL DRIVERS OF HEALTH SDOH Screenings   Food Insecurity: No Food Insecurity (12/13/2022)  Housing: Low Risk  (12/13/2022)  Transportation Needs: No Transportation Needs (12/13/2022)  Utilities: Not At Risk (12/13/2022)  Social Connections: Unknown (02/21/2022)   Received from [facility]  Tobacco Use: Low Risk  (04/09/2024)     FAMILY HISTORY Family History  Problem Relation Age of Onset   Heart disease Father    Dementia Father    Aneurysm Mother    Hyperlipidemia Mother    Alzheimer's disease Mother    Osteoarthritis Mother    Hypertension Brother    High Cholesterol Brother    Cancer - Other Paternal Grandmother        uterine   Hypertension Paternal Grandfather    Stroke Paternal Grandfather    Heart disease Maternal Grandfather    Cancer - Other Maternal Grandfather        lung   Cushing syndrome Brother    Healthy Son    Healthy Daughter      ALLERGIES: has no known allergies.  MEDICATIONS  Current Outpatient Medications  Medication Sig Dispense Refill   acetaminophen  (TYLENOL ) 500 MG tablet Take 500 mg by mouth every 6 (six) hours as needed for moderate pain.     albuterol  (VENTOLIN  HFA) 108 (90 Base) MCG/ACT inhaler Inhale 1-2 puffs into the lungs every 6 (six) hours as needed for wheezing or shortness of breath.     amLODipine  (NORVASC ) 5 MG tablet Take 1 tablet (5 mg total) by  mouth at bedtime. 90 tablet 1   atorvastatin  (LIPITOR) 20 MG tablet Take 1 tablet (20 mg total) by mouth at bedtime. 90 tablet 2   carvedilol  (COREG ) 12.5 MG tablet Take 0.5 tablets (6.25 mg total) by mouth 2 (two) times daily with a meal. 90 tablet 2   ezetimibe  (ZETIA ) 10 MG tablet Take 1 tablet (10 mg total) by mouth at bedtime. 90 tablet 1   famotidine  (PEPCID ) 40 MG tablet Take 40 mg by mouth 2 (two) times daily.     No current facility-administered medications for this visit.    PHYSICAL EXAMINATION: ECOG PERFORMANCE STATUS: 1 - Symptomatic but completely ambulatory VITALS: Vitals:   09/02/24 1448  BP: (!) 133/59  Pulse: 75  Resp: 20  Temp: 97.7 F (36.5 C)  SpO2: 97%   Filed Weights   09/02/24 1448  Weight: 143 lb 4.8 oz (65 kg)   Body mass index is 27.08 kg/m.  GENERAL: alert, in no acute distress and comfortable SKIN: no acute rashes, no significant lesions EYES: conjunctiva are pink and non-injected, sclera anicteric OROPHARYNX: MMM, no exudates, no oropharyngeal erythema or ulceration NECK: supple, no JVD LYMPH:  no palpable lymphadenopathy in the cervical, axillary or inguinal regions LUNGS: clear to auscultation b/l with normal respiratory effort HEART: regular rate & rhythm ABDOMEN:  normoactive bowel sounds , non tender, not distended, no hepatosplenomegaly Extremity: no pedal edema PSYCH: alert & oriented x 3 with fluent speech NEURO: no focal motor/sensory deficits  LABORATORY DATA:   I have reviewed the data as listed     Latest Ref Rng & Units 09/02/2024    1:49 PM 02/27/2024   10:15 AM 08/28/2023   12:07 PM  CBC EXTENDED  WBC 4.0 - 10.5 K/uL 5.7  4.8  5.7   RBC 3.87 - 5.11 MIL/uL 4.59  4.46  4.45   Hemoglobin 12.0 - 15.0 g/dL 86.3  86.4  86.8   HCT 36.0 - 46.0 % 40.4  39.0  39.5   Platelets 150 - 400 K/uL 266  241  276   NEUT# 1.7 - 7.7 K/uL 3.2  3.2  3.8   Lymph# 0.7 - 4.0 K/uL 1.6  1.1  1.3        Latest Ref Rng & Units 09/02/2024     1:49 PM 02/27/2024   10:15 AM 08/28/2023   12:07 PM  CMP  Glucose 70 - 99 mg/dL 898  99  897   BUN 8 - 23 mg/dL 17  16  15    Creatinine 0.44 - 1.00 mg/dL 9.26  9.35  9.32   Sodium 135 - 145 mmol/L 140  140  141   Potassium 3.5 - 5.1 mmol/L 4.1  4.5  3.9   Chloride 98 - 111 mmol/L 102  105  103   CO2 22 - 32 mmol/L 29  30  31    Calcium  8.9 - 10.3 mg/dL 89.5  9.2  89.8   Total Protein 6.5 - 8.1 g/dL 7.4  6.8  7.2   Total Bilirubin 0.0 - 1.2 mg/dL 0.6  0.6  0.6   Alkaline Phos 38 - 126 U/L 51  45  49   AST 15 - 41 U/L 24  18  17    ALT 0 - 44 U/L 21  18  14           03/03/2020 FISH (CLL Prognosis) Panel:        RADIOGRAPHIC STUDIES: I have personally reviewed the radiological images as listed and agreed with the findings in the report. CT FOOT RIGHT WO CONTRAST Result Date: 08/11/2024 CLINICAL DATA:  Chronic right foot pain.  Osteoarthritis. EXAM: CT OF THE RIGHT FOOT WITHOUT CONTRAST TECHNIQUE: Multidetector CT imaging of the right foot was performed according to the standard protocol. Multiplanar CT image reconstructions were also generated. RADIATION DOSE REDUCTION: This exam was performed according to the departmental dose-optimization program which includes automated exposure control, adjustment of the mA and/or kV according to patient size and/or use of iterative reconstruction technique. COMPARISON:  CT right foot 11/06/2022. FINDINGS: Bones/Joint/Cartilage There is no acute bony or joint abnormality. Again seen is an osteochondral lesion of the medial talar dome measuring approximately 0.9 cm transverse  by 0.8 cm AP. A focal concavity in the talar dome is unchanged measuring 0.5 cm in diameter. Small cysts are seen subjacent to the osteochondral lesion. Also again seen is osteoarthritis of the midfoot which appears worst at the third and fourth tarsometatarsal joints. Mild to moderate first MTP osteoarthritis is also unchanged. Scattered interphalangeal joint osteoarthritis appears  worst at the PIP joint of the second toe, unchanged. No erosion is identified. Small plantar calcaneal spur is noted. Ligaments Suboptimally assessed by CT. Muscles and Tendons Normal appearance on CT scan. Soft tissues Negative.  No fluid collection or mass. IMPRESSION: 1. No change in the appearance of the right foot since the prior CT. 2. Osteochondral lesion of the medial talar dome. 3. Osteoarthritis of the midfoot appears worst at the third and fourth tarsometatarsal joints. 4. Mild to moderate first MTP osteoarthritis. 5. Scattered interphalangeal joint osteoarthritis appears worst at the PIP joint of the second toe. Electronically Signed   By: Debby Prader M.D.   On: 08/11/2024 09:56    ASSESSMENT & PLAN:  68 y.o. female with  1) Rai Stage 1 Chronic lymphocytic Leukemia. Found to have trisomy 12 and 13q deletion via 03/03/2020 FISH (CLL Prognosis) Panel  Patient had presented with increasing dyspnea and was found to have CLL involvement of her airways.  Her dyspnea has completely resolved at this time. 2) history of neoplasm of left breast, primary tumor staging category Tis: lobular carcinoma in situ (LCIS) Left breast LCIS status post lumpectomy 10/06/2014; Started tamoxifen  for breast cancer risk reduction 20 mg daily 11/18/2014 , decrease to 10 mg on 11/22/2017 completed February 2021.   Breast cancer surveillance:  Continue yearly MMGs with PCP  PLAN: - Discussed lab results on 09/02/2024 in detail with patient: -CBC is normal today -LDH is normal -Albumin  is normal and calcium  levels are high Filled and signed surgical clearance form for foot surgery from CLL standpoint. Patient has no lab or clinical findings suggestive of CLL progression at this time. -Follow-up in 6 months  FOLLOW-UP  RTC with Dr Onesimo with labs in 6 months   The total time spent in the appointment was 23 minutes* .  All of the patient's questions were answered and the patient knows to call the clinic  with any problems, questions, or concerns.  Emaline Onesimo MD MS AAHIVMS [facility] Hematology/Oncology Physician [facility]  *Total Encounter Time as defined by the Centers for Medicare and Medicaid Services includes, in addition to the face-to-face time of a patient visit (documented in the note above) non-face-to-face time: obtaining and reviewing outside history, ordering and reviewing medications, tests or procedures, care coordination (communications with other health care professionals or caregivers) and documentation in the medical record.  LILLETTE Alan Blowers, acting as a neurosurgeon for Eduard Penkala, MD.,have documented all relevant documentation on the behalf of Emaline Onesimo, MD Emaline Onesimo, MD

## 2024-09-03 ENCOUNTER — Encounter: Payer: Self-pay | Admitting: Hematology

## 2024-09-10 DIAGNOSIS — L988 Other specified disorders of the skin and subcutaneous tissue: Secondary | ICD-10-CM | POA: Diagnosis not present

## 2024-09-10 DIAGNOSIS — C4441 Basal cell carcinoma of skin of scalp and neck: Secondary | ICD-10-CM | POA: Diagnosis not present

## 2024-09-11 DIAGNOSIS — Z1151 Encounter for screening for human papillomavirus (HPV): Secondary | ICD-10-CM | POA: Diagnosis not present

## 2024-09-11 DIAGNOSIS — Z124 Encounter for screening for malignant neoplasm of cervix: Secondary | ICD-10-CM | POA: Diagnosis not present

## 2025-03-03 ENCOUNTER — Inpatient Hospital Stay: Admitting: Hematology

## 2025-03-03 ENCOUNTER — Inpatient Hospital Stay
